# Patient Record
Sex: Female | Born: 1935 | Race: White | Hispanic: No | Marital: Married | Smoking: Never smoker
Health system: Southern US, Community
[De-identification: ages and names within clinical notes are randomized; demographics above are authoritative.]

## PROBLEM LIST (undated history)

## (undated) DIAGNOSIS — C50919 Malignant neoplasm of unspecified site of unspecified female breast: Secondary | ICD-10-CM

## (undated) DIAGNOSIS — C801 Malignant (primary) neoplasm, unspecified: Secondary | ICD-10-CM

## (undated) DIAGNOSIS — E039 Hypothyroidism, unspecified: Secondary | ICD-10-CM

## (undated) DIAGNOSIS — E079 Disorder of thyroid, unspecified: Secondary | ICD-10-CM

## (undated) DIAGNOSIS — I499 Cardiac arrhythmia, unspecified: Secondary | ICD-10-CM

## (undated) DIAGNOSIS — C541 Malignant neoplasm of endometrium: Secondary | ICD-10-CM

## (undated) DIAGNOSIS — E785 Hyperlipidemia, unspecified: Secondary | ICD-10-CM

## (undated) DIAGNOSIS — R609 Edema, unspecified: Secondary | ICD-10-CM

## (undated) DIAGNOSIS — I10 Essential (primary) hypertension: Secondary | ICD-10-CM

## (undated) DIAGNOSIS — N189 Chronic kidney disease, unspecified: Secondary | ICD-10-CM

## (undated) DIAGNOSIS — K589 Irritable bowel syndrome without diarrhea: Secondary | ICD-10-CM

## (undated) HISTORY — PX: CATARACT EXTRACTION: SUR2

## (undated) HISTORY — PX: CHOLECYSTECTOMY: SHX55

## (undated) HISTORY — DX: Disorder of thyroid, unspecified: E07.9

## (undated) HISTORY — DX: Cardiac arrhythmia, unspecified: I49.9

## (undated) HISTORY — DX: Hyperlipidemia, unspecified: E78.5

## (undated) HISTORY — PX: TOE AMPUTATION: SHX809

## (undated) HISTORY — DX: Malignant neoplasm of endometrium: C54.1

## (undated) HISTORY — DX: Edema, unspecified: R60.9

## (undated) HISTORY — DX: Essential (primary) hypertension: I10

## (undated) HISTORY — DX: Malignant (primary) neoplasm, unspecified: C80.1

## (undated) HISTORY — DX: Irritable bowel syndrome, unspecified: K58.9

## (undated) HISTORY — DX: Chronic kidney disease, unspecified: N18.9

---

## 2011-01-21 LAB — HM COLONOSCOPY: HM Colonoscopy: NORMAL

## 2011-12-28 LAB — HM MAMMOGRAPHY: HM Mammogram: NORMAL

## 2012-01-21 LAB — HM PAP SMEAR: HM Pap smear: NORMAL

## 2012-04-29 ENCOUNTER — Ambulatory Visit: Payer: Self-pay | Admitting: Family Medicine

## 2012-07-30 ENCOUNTER — Ambulatory Visit (INDEPENDENT_AMBULATORY_CARE_PROVIDER_SITE_OTHER)
Admission: RE | Admit: 2012-07-30 | Discharge: 2012-07-30 | Disposition: A | Discharge status: Home / Self Care | Payer: Medicare Other | Source: Ambulatory Visit | Attending: Internal Medicine | Admitting: Internal Medicine

## 2012-07-30 ENCOUNTER — Ambulatory Visit (INDEPENDENT_AMBULATORY_CARE_PROVIDER_SITE_OTHER): Payer: Medicare Other | Admitting: Internal Medicine

## 2012-07-30 ENCOUNTER — Encounter: Payer: Self-pay | Admitting: Internal Medicine

## 2012-07-30 ENCOUNTER — Ambulatory Visit: Payer: Medicare Other

## 2012-07-30 VITALS — BP 102/80 | HR 52 | Temp 97.7°F | Ht 64.25 in | Wt 185.0 lb

## 2012-07-30 DIAGNOSIS — E785 Hyperlipidemia, unspecified: Secondary | ICD-10-CM

## 2012-07-30 DIAGNOSIS — J40 Bronchitis, not specified as acute or chronic: Secondary | ICD-10-CM

## 2012-07-30 DIAGNOSIS — I10 Essential (primary) hypertension: Secondary | ICD-10-CM | POA: Insufficient documentation

## 2012-07-30 DIAGNOSIS — R350 Frequency of micturition: Secondary | ICD-10-CM

## 2012-07-30 DIAGNOSIS — E039 Hypothyroidism, unspecified: Secondary | ICD-10-CM | POA: Insufficient documentation

## 2012-07-30 LAB — COMPREHENSIVE METABOLIC PANEL
ALT: 18 U/L (ref 0–35)
AST: 25 U/L (ref 0–37)
Albumin: 3.7 g/dL (ref 3.5–5.2)
Alkaline Phosphatase: 78 U/L (ref 39–117)
BUN: 23 mg/dL (ref 6–23)
CO2: 25 mEq/L (ref 19–32)
Calcium: 9.1 mg/dL (ref 8.4–10.5)
Chloride: 100 mEq/L (ref 96–112)
Creatinine, Ser: 1.3 mg/dL — ABNORMAL HIGH (ref 0.4–1.2)
GFR: 40.89 mL/min — ABNORMAL LOW (ref >60.00)
Glucose, Bld: 110 mg/dL — ABNORMAL HIGH (ref 70–99)
Potassium: 4.8 mEq/L (ref 3.5–5.1)
Sodium: 133 mEq/L — ABNORMAL LOW (ref 135–145)
Total Bilirubin: 0.3 mg/dL (ref 0.3–1.2)
Total Protein: 7.2 g/dL (ref 6.0–8.3)

## 2012-07-30 LAB — POCT URINALYSIS DIPSTICK
Bilirubin, UA: NEGATIVE
Glucose, UA: NEGATIVE
Nitrite, UA: NEGATIVE
Protein, UA: 30
Spec Grav, UA: 1.025
Urobilinogen, UA: 0.2
pH, UA: 5.5

## 2012-07-30 LAB — LIPID PANEL
Cholesterol: 164 mg/dL (ref 0–200)
HDL: 30.4 mg/dL — ABNORMAL LOW (ref >39.00)
LDL Cholesterol: 101 mg/dL — ABNORMAL HIGH (ref 0–99)
Total CHOL/HDL Ratio: 5
Triglycerides: 165 mg/dL — ABNORMAL HIGH (ref 0.0–149.0)
VLDL: 33 mg/dL (ref 0.0–40.0)

## 2012-07-30 LAB — TSH: TSH: 1.16 u[IU]/mL (ref 0.35–5.50)

## 2012-07-30 MED ORDER — LEVOFLOXACIN 500 MG PO TABS: 500.0000 mg | ORAL_TABLET | Freq: Every day | ORAL | Status: DC

## 2012-07-30 MED ORDER — HYDROCOD POLST-CHLORPHEN POLST 10-8 MG/5ML PO LQCR: 5.0000 mL | Freq: Two times a day (BID) | ORAL | Status: DC | PRN

## 2012-07-30 MED ORDER — LEVOFLOXACIN 500 MG PO TABS: 250.0000 mg | ORAL_TABLET | Freq: Every day | ORAL | Status: DC

## 2012-07-30 NOTE — Assessment & Plan Note (Signed)
Lipids show mild elevation of triglycerides. LDL normal. Will request old records for comparison.

## 2012-07-30 NOTE — Assessment & Plan Note (Addendum)
Symptoms of increased urinary frequency with coughing, but no dysuria, fever, chills, flank pain. Urinalysis shows trace blood and leukocytes but no other pos markers of infection. Will send urine for culture.

## 2012-07-30 NOTE — Assessment & Plan Note (Signed)
TSH normal today. Continue Synthroid.

## 2012-07-30 NOTE — Progress Notes (Signed)
Subjective:    Patient ID: Whitney Simmons, female    DOB: 09-16-1927, 79 y.o.   MRN: [id number]  HPI 79 year old female with history of hypertension, hypothyroidism presents to establish care. She recently moved with her husband from [place]. Her primary concern today is a one-week history of nasal congestion, cough productive of purulent sputum, and occasional shortness of breath. She denies any chest pain. She denies any fever or chills. She has not taken any medication for this.  In regards to her chronic medical conditions, she reports full compliance with her medicines. She was last seen by her former primary care physician in approximately August of last year.  Outpatient Encounter Prescriptions as of 07/30/2012  Medication Sig Dispense Refill  . aspirin 81 MG tablet Take 81 mg by mouth daily.      Marland Kitchen levothyroxine (SYNTHROID, LEVOTHROID) 100 MCG tablet Take 100 mcg by mouth daily.      Marland Kitchen losartan (COZAAR) 50 MG tablet Take 50 mg by mouth daily.      . Multiple Vitamin (MULTIVITAMIN) tablet Take 1 tablet by mouth daily.      . nebivolol (BYSTOLIC) 5 MG tablet Take 5 mg by mouth daily.      . chlorpheniramine-HYDROcodone (TUSSIONEX) 10-8 MG/5ML LQCR Take 5 mLs by mouth every 12 (twelve) hours as needed.  140 mL  0  . levofloxacin (LEVAQUIN) 500 MG tablet Take 0.5 tablets (250 mg total) by mouth daily.  7 tablet  0   BP 102/80  Pulse 52  Temp 97.7 F (36.5 C)  Ht 5' 4.25" (1.632 m)  Wt 185 lb (83.915 kg)  BMI 31.51 kg/m2  SpO2 96%  Review of Systems  Constitutional: Positive for fatigue. Negative for fever, chills, appetite change and unexpected weight change.  HENT: Positive for congestion, rhinorrhea and postnasal drip. Negative for ear pain, sore throat, trouble swallowing, neck pain, voice change and sinus pressure.   Eyes: Negative for visual disturbance.  Respiratory: Positive for cough and shortness of breath. Negative for wheezing and stridor.   Cardiovascular:  Negative for chest pain, palpitations and leg swelling.  Gastrointestinal: Negative for nausea, vomiting, abdominal pain, diarrhea, constipation, blood in stool, abdominal distention and anal bleeding.  Genitourinary: Negative for dysuria and flank pain.  Musculoskeletal: Negative for myalgias, arthralgias and gait problem.  Skin: Negative for color change and rash.  Neurological: Negative for dizziness and headaches.  Hematological: Negative for adenopathy. Does not bruise/bleed easily.  Psychiatric/Behavioral: Negative for suicidal ideas, sleep disturbance and dysphoric mood. The patient is not nervous/anxious.        Objective:   Physical Exam  Constitutional: She is oriented to person, place, and time. She appears well-developed and well-nourished. No distress.  HENT:  Head: Normocephalic and atraumatic.  Right Ear: External ear normal.  Left Ear: External ear normal.  Nose: Nose normal.  Mouth/Throat: Oropharynx is clear and moist. No oropharyngeal exudate.  Eyes: Conjunctivae normal are normal. Pupils are equal, round, and reactive to light. Right eye exhibits no discharge. Left eye exhibits no discharge. No scleral icterus.  Neck: Normal range of motion. Neck supple. No tracheal deviation present. No thyromegaly present.  Cardiovascular: Normal rate, regular rhythm, normal heart sounds and intact distal pulses.  Exam reveals no gallop and no friction rub.   No murmur heard. Pulmonary/Chest: Effort normal. No accessory muscle usage. Not tachypneic. No respiratory distress. She has no decreased breath sounds. She has no wheezes. She has rhonchi (scattered). She has no rales. She exhibits  no tenderness.  Musculoskeletal: Normal range of motion. She exhibits no edema and no tenderness.  Lymphadenopathy:    She has no cervical adenopathy.  Neurological: She is alert and oriented to person, place, and time. No cranial nerve deficit. She exhibits normal muscle tone. Coordination normal.   Skin: Skin is warm and dry. No rash noted. She is not diaphoretic. No erythema. No pallor.  Psychiatric: She has a normal mood and affect. Her behavior is normal. Judgment and thought content normal.          Assessment & Plan:

## 2012-07-30 NOTE — Assessment & Plan Note (Signed)
Symptoms and exam consistent with bronchitis. CXR suggests possible early pneumonia in the lingula. Will treat with levaquin. Will use Tussionex prn cough. (Note that initial dose called in was 500mg  daily, however renal function came back showing GFR 40, so called patient and will treat with 250mg  daily x 7 days instead. Follow up on Monday.

## 2012-07-30 NOTE — Assessment & Plan Note (Signed)
BP Readings from Last 3 Encounters:  07/30/12 102/80   BP well controlled with current medications. Renal function decreased on labs today. No previous labs for comparison. Will request old records.

## 2012-07-31 LAB — CBC WITH DIFFERENTIAL/PLATELET
Basophils Absolute: 0.1 10*3/uL (ref 0.0–0.1)
Basophils Relative: 1 % (ref 0–1)
Eosinophils Absolute: 0.1 10*3/uL (ref 0.0–0.7)
Eosinophils Relative: 1 % (ref 0–5)
HCT: 39.4 % (ref 36.0–46.0)
Hemoglobin: 13.5 g/dL (ref 12.0–15.0)
Lymphocytes Relative: 16 % (ref 12–46)
Lymphs Abs: 1.2 10*3/uL (ref 0.7–4.0)
MCH: 29.6 pg (ref 26.0–34.0)
MCHC: 34.3 g/dL (ref 30.0–36.0)
MCV: 86.4 fL (ref 78.0–100.0)
Monocytes Absolute: 1 10*3/uL (ref 0.1–1.0)
Monocytes Relative: 13 % — ABNORMAL HIGH (ref 3–12)
Neutro Abs: 5.4 10*3/uL (ref 1.7–7.7)
Neutrophils Relative %: 69 % (ref 43–77)
RBC: 4.56 MIL/uL (ref 3.87–5.11)
RDW: 14.2 % (ref 11.5–15.5)
WBC: 7.7 10*3/uL (ref 4.0–10.5)

## 2012-08-01 LAB — URINE CULTURE: Colony Count: >=100000

## 2012-08-02 ENCOUNTER — Encounter: Payer: Self-pay | Admitting: Internal Medicine

## 2012-08-02 ENCOUNTER — Ambulatory Visit (INDEPENDENT_AMBULATORY_CARE_PROVIDER_SITE_OTHER): Payer: Medicare Other | Admitting: Internal Medicine

## 2012-08-02 VITALS — BP 124/80 | HR 75 | Temp 97.7°F | Ht 64.25 in | Wt 184.0 lb

## 2012-08-02 DIAGNOSIS — J40 Bronchitis, not specified as acute or chronic: Secondary | ICD-10-CM

## 2012-08-02 DIAGNOSIS — F411 Generalized anxiety disorder: Secondary | ICD-10-CM

## 2012-08-02 DIAGNOSIS — F419 Anxiety disorder, unspecified: Secondary | ICD-10-CM | POA: Insufficient documentation

## 2012-08-02 NOTE — Patient Instructions (Signed)
Start Miralax 17gm (1 capful) mixed in a beverage by mouth daily as needed for constipation.

## 2012-08-02 NOTE — Assessment & Plan Note (Signed)
Symptoms of anxiety worsened with certain medications. Offered support today. Discussed adding medication to help with anxiety symptoms, but pt would like to hold off for now. Will recheck in 2 days.

## 2012-08-02 NOTE — Progress Notes (Signed)
Subjective:    Patient ID: Whitney Simmons, female    DOB: 03-17-1928, 79 y.o.   MRN: [id number]  HPI 79YO female with recent h/o bronchitis presents for follow up. Tearful today. Having severe anxiety with use of Levaquin. Reports similar episodes in the past with other antibiotics. Describes paranoia and extreme anxiety, no hallucinations. Denies any recurrent fever, chills, chest pain. Cough has been gradually improving. Using Tussionex with improvement in cough symptoms.  Outpatient Encounter Prescriptions as of 08/02/2012  Medication Sig Dispense Refill  . aspirin 81 MG tablet Take 81 mg by mouth daily.      . chlorpheniramine-HYDROcodone (TUSSIONEX) 10-8 MG/5ML LQCR Take 5 mLs by mouth every 12 (twelve) hours as needed.  140 mL  0  . levothyroxine (SYNTHROID, LEVOTHROID) 100 MCG tablet Take 100 mcg by mouth daily.      Marland Kitchen losartan (COZAAR) 50 MG tablet Take 50 mg by mouth daily.      . Multiple Vitamin (MULTIVITAMIN) tablet Take 1 tablet by mouth daily.      . nebivolol (BYSTOLIC) 5 MG tablet Take 5 mg by mouth daily.      . [DISCONTINUED] levofloxacin (LEVAQUIN) 500 MG tablet Take 0.5 tablets (250 mg total) by mouth daily.  7 tablet  0   BP 124/80  Pulse 75  Temp 97.7 F (36.5 C) (Oral)  Ht 5' 4.25" (1.632 m)  Wt 184 lb (83.462 kg)  BMI 31.34 kg/m2  SpO2 97%  Review of Systems  Constitutional: Negative for fever, chills, appetite change, fatigue and unexpected weight change.  HENT: Negative for ear pain, congestion, sore throat, trouble swallowing, neck pain, voice change and sinus pressure.   Eyes: Negative for visual disturbance.  Respiratory: Positive for cough. Negative for shortness of breath, wheezing and stridor.   Cardiovascular: Negative for chest pain, palpitations and leg swelling.  Gastrointestinal: Negative for nausea, vomiting, abdominal pain, diarrhea, constipation, blood in stool, abdominal distention and anal bleeding.  Genitourinary: Negative for dysuria and flank  pain.  Musculoskeletal: Negative for myalgias, arthralgias and gait problem.  Skin: Negative for color change and rash.  Neurological: Negative for dizziness and headaches.  Hematological: Negative for adenopathy. Does not bruise/bleed easily.  Psychiatric/Behavioral: Negative for suicidal ideas, sleep disturbance and dysphoric mood. The patient is nervous/anxious.        Objective:   Physical Exam  Constitutional: She is oriented to person, place, and time. She appears well-developed and well-nourished. No distress.  HENT:  Head: Normocephalic and atraumatic.  Right Ear: External ear normal.  Left Ear: External ear normal.  Nose: Nose normal.  Mouth/Throat: Oropharynx is clear and moist. No oropharyngeal exudate.  Eyes: Conjunctivae normal are normal. Pupils are equal, round, and reactive to light. Right eye exhibits no discharge. Left eye exhibits no discharge. No scleral icterus.  Neck: Normal range of motion. Neck supple. No tracheal deviation present. No thyromegaly present.  Cardiovascular: Normal rate, regular rhythm, normal heart sounds and intact distal pulses.  Exam reveals no gallop and no friction rub.   No murmur heard. Pulmonary/Chest: Effort normal. No accessory muscle usage. Not tachypneic. No respiratory distress. She has no decreased breath sounds. She has no wheezes. She has rhonchi (diffuse). She has no rales. She exhibits no tenderness.  Musculoskeletal: Normal range of motion. She exhibits no edema and no tenderness.  Lymphadenopathy:    She has no cervical adenopathy.  Neurological: She is alert and oriented to person, place, and time. No cranial nerve deficit. She exhibits normal muscle tone.  Coordination normal.  Skin: Skin is warm and dry. No rash noted. She is not diaphoretic. No erythema. No pallor.  Psychiatric: Her speech is normal. Judgment and thought content normal. Her mood appears anxious. She is agitated. Cognition and memory are normal.           Assessment & Plan:

## 2012-08-02 NOTE — Assessment & Plan Note (Signed)
Completed 3 days of antibiotics, however having severe anxiety with antibiotic medication. Reports similar experience with other antibiotics. Given that she is afebrile, CXR normal, will stop antibiotics. Will monitor symptoms closely. If any recurrent fever or worsening cough, will try alternative antibiotic. Will try to obtain records on previous evaluation and review records on what antibiotics were better tolerated in the past. Follow up 2 days.

## 2012-08-04 ENCOUNTER — Ambulatory Visit (INDEPENDENT_AMBULATORY_CARE_PROVIDER_SITE_OTHER): Payer: Medicare Other | Admitting: Internal Medicine

## 2012-08-04 ENCOUNTER — Encounter: Payer: Self-pay | Admitting: Internal Medicine

## 2012-08-04 VITALS — BP 144/70 | HR 70 | Temp 97.8°F | Ht 64.25 in | Wt 183.2 lb

## 2012-08-04 DIAGNOSIS — F411 Generalized anxiety disorder: Secondary | ICD-10-CM

## 2012-08-04 DIAGNOSIS — F419 Anxiety disorder, unspecified: Secondary | ICD-10-CM

## 2012-08-04 DIAGNOSIS — J40 Bronchitis, not specified as acute or chronic: Secondary | ICD-10-CM

## 2012-08-04 MED ORDER — CLORAZEPATE DIPOTASSIUM 3.75 MG PO TABS: 3.7500 mg | ORAL_TABLET | Freq: Two times a day (BID) | ORAL | Status: DC | PRN

## 2012-08-04 NOTE — Assessment & Plan Note (Signed)
Symptoms gradually improving. Will continue to monitor.

## 2012-08-04 NOTE — Assessment & Plan Note (Signed)
Significant anxiety related to recent move to [place], providing care for husband and daughter's recent diagnosis of leukemia. Will start clorazepate twice daily as needed. Follow up in 1-2 week and prn.

## 2012-08-04 NOTE — Progress Notes (Signed)
Subjective:    Patient ID: Whitney Simmons, female    DOB: Nov 23, 1927, 79 y.o.   MRN: [id number]  HPI 79 year old female with recent history of bronchitis presents for followup. She was treated with Levaquin for 3 days but had worsening anxiety on this medication. It was stopped 2 days ago. She reports that symptoms of cough are gradually improving. No fever, chills, chest pain, dyspnea. However, she continues to have significant anxiety. She attributes this to recent move to [facility], caring for her husband and her daughters recent diagnosis of leukemia. She is tearful today describing this. She has taken medication for anxiety in the distant past and thinks it was clorazepate. She did well with this medication. She would like to restart it.  Outpatient Encounter Prescriptions as of 08/04/2012  Medication Sig Dispense Refill  . aspirin 81 MG tablet Take 81 mg by mouth daily.      . chlorpheniramine-HYDROcodone (TUSSIONEX) 10-8 MG/5ML LQCR Take 5 mLs by mouth every 12 (twelve) hours as needed.  140 mL  0  . levothyroxine (SYNTHROID, LEVOTHROID) 100 MCG tablet Take 100 mcg by mouth daily.      Marland Kitchen losartan (COZAAR) 50 MG tablet Take 50 mg by mouth daily.      . Multiple Vitamin (MULTIVITAMIN) tablet Take 1 tablet by mouth daily.      . nebivolol (BYSTOLIC) 5 MG tablet Take 5 mg by mouth daily.      . clorazepate (TRANXENE) 3.75 MG tablet Take 1 tablet (3.75 mg total) by mouth 2 (two) times daily as needed for anxiety.  60 tablet  3   BP 144/70  Pulse 70  Temp 97.8 F (36.6 C) (Oral)  Ht 5' 4.25" (1.632 m)  Wt 183 lb 4 oz (83.122 kg)  BMI 31.21 kg/m2  SpO2 98%  Review of Systems  Constitutional: Negative for fever, chills, appetite change, fatigue and unexpected weight change.  HENT: Negative for ear pain, congestion, sore throat, trouble swallowing, neck pain, voice change and sinus pressure.   Eyes: Negative for visual disturbance.  Respiratory: Positive for cough. Negative  for shortness of breath, wheezing and stridor.   Cardiovascular: Negative for chest pain, palpitations and leg swelling.  Gastrointestinal: Negative for nausea, vomiting, abdominal pain, diarrhea, constipation, blood in stool, abdominal distention and anal bleeding.  Genitourinary: Negative for dysuria and flank pain.  Musculoskeletal: Negative for myalgias, arthralgias and gait problem.  Skin: Negative for color change and rash.  Neurological: Negative for dizziness and headaches.  Hematological: Negative for adenopathy. Does not bruise/bleed easily.  Psychiatric/Behavioral: Positive for sleep disturbance. Negative for suicidal ideas and dysphoric mood. The patient is nervous/anxious.        Objective:   Physical Exam  Constitutional: She is oriented to person, place, and time. She appears well-developed and well-nourished. No distress.  HENT:  Head: Normocephalic and atraumatic.  Right Ear: External ear normal.  Left Ear: External ear normal.  Nose: Nose normal.  Mouth/Throat: Oropharynx is clear and moist.  Eyes: Conjunctivae normal are normal. Pupils are equal, round, and reactive to light. Right eye exhibits no discharge. Left eye exhibits no discharge. No scleral icterus.  Neck: Normal range of motion. Neck supple. No tracheal deviation present. No thyromegaly present.  Cardiovascular: Normal rate, regular rhythm, normal heart sounds and intact distal pulses.  Exam reveals no gallop and no friction rub.   No murmur heard. Pulmonary/Chest: Effort normal. No accessory muscle usage. Not tachypneic. No respiratory distress. She has no wheezes. She has  rhonchi (scattered). She has no rales. She exhibits no tenderness.  Musculoskeletal: Normal range of motion. She exhibits no edema and no tenderness.  Lymphadenopathy:    She has no cervical adenopathy.  Neurological: She is alert and oriented to person, place, and time. No cranial nerve deficit. She exhibits normal muscle tone.  Coordination normal.  Skin: Skin is warm and dry. No rash noted. She is not diaphoretic. No erythema. No pallor.  Psychiatric: She has a normal mood and affect. Her behavior is normal. Judgment and thought content normal.          Assessment & Plan:

## 2012-08-18 ENCOUNTER — Encounter: Payer: Self-pay | Admitting: Internal Medicine

## 2012-08-18 ENCOUNTER — Ambulatory Visit: Payer: Medicare Other | Admitting: Internal Medicine

## 2012-08-18 ENCOUNTER — Ambulatory Visit (INDEPENDENT_AMBULATORY_CARE_PROVIDER_SITE_OTHER): Payer: Medicare Other | Admitting: Internal Medicine

## 2012-08-18 VITALS — BP 113/67 | HR 70 | Temp 97.5°F | Wt 184.0 lb

## 2012-08-18 DIAGNOSIS — F419 Anxiety disorder, unspecified: Secondary | ICD-10-CM

## 2012-08-18 DIAGNOSIS — R7989 Other specified abnormal findings of blood chemistry: Secondary | ICD-10-CM

## 2012-08-18 DIAGNOSIS — R799 Abnormal finding of blood chemistry, unspecified: Secondary | ICD-10-CM

## 2012-08-18 DIAGNOSIS — N183 Chronic kidney disease, stage 3 unspecified: Secondary | ICD-10-CM | POA: Insufficient documentation

## 2012-08-18 DIAGNOSIS — F411 Generalized anxiety disorder: Secondary | ICD-10-CM

## 2012-08-18 LAB — COMPREHENSIVE METABOLIC PANEL
ALT: 17 U/L (ref 0–35)
AST: 19 U/L (ref 0–37)
Albumin: 3.7 g/dL (ref 3.5–5.2)
Alkaline Phosphatase: 63 U/L (ref 39–117)
BUN: 17 mg/dL (ref 6–23)
CO2: 24 mEq/L (ref 19–32)
Calcium: 8.9 mg/dL (ref 8.4–10.5)
Chloride: 105 mEq/L (ref 96–112)
Creatinine, Ser: 1.2 mg/dL (ref 0.4–1.2)
GFR: 45.63 mL/min — ABNORMAL LOW (ref >60.00)
Glucose, Bld: 129 mg/dL — ABNORMAL HIGH (ref 70–99)
Potassium: 4.9 mEq/L (ref 3.5–5.1)
Sodium: 138 mEq/L (ref 135–145)
Total Bilirubin: 0.4 mg/dL (ref 0.3–1.2)
Total Protein: 6.8 g/dL (ref 6.0–8.3)

## 2012-08-18 MED ORDER — SERTRALINE HCL 25 MG PO TABS: 25.0000 mg | ORAL_TABLET | Freq: Every day | ORAL | Status: DC

## 2012-08-18 NOTE — Assessment & Plan Note (Signed)
Symptoms poorly controlled on Tranxene. Will change to Sertraline, starting at 25mg  daily. Plan to follow up in 2-3 weeks. She will call sooner if any problems or concerns.

## 2012-08-18 NOTE — Assessment & Plan Note (Signed)
Cr of 1.3 noted on recent labs, stable today 1.2.  Will try to obtain previous records for comparison.

## 2012-08-18 NOTE — Progress Notes (Signed)
Subjective:    Patient ID: Whitney Simmons, female    DOB: 1928/04/20, 79 y.o.   MRN: [id number]  HPI 79YO female with h/o hypothyroidism, anxiety, hypertension presents for follow up.   Anxiety - symptoms poorly controlled on Tranxene. Having difficulty sleeping. Anxious and depressed mood. Ongoing stressors related to husband's health issues, recent move, and daughter's diagnosis of leukemia. No currently undergoing counseling.  Outpatient Encounter Prescriptions as of 08/18/2012  Medication Sig Dispense Refill  . aspirin 81 MG tablet Take 81 mg by mouth daily.      Marland Kitchen levothyroxine (SYNTHROID, LEVOTHROID) 100 MCG tablet Take 100 mcg by mouth daily.      Marland Kitchen losartan (COZAAR) 50 MG tablet Take 50 mg by mouth daily.      . Multiple Vitamin (MULTIVITAMIN) tablet Take 1 tablet by mouth daily.      . nebivolol (BYSTOLIC) 5 MG tablet Take 5 mg by mouth daily.      . sertraline (ZOLOFT) 25 MG tablet Take 1 tablet (25 mg total) by mouth daily.  30 tablet  3   No facility-administered encounter medications on file as of 08/18/2012.   BP 113/67  Pulse 70  Temp(Src) 97.5 F (36.4 C) (Oral)  Wt 184 lb (83.462 kg)  BMI 31.34 kg/m2  SpO2 97%  Review of Systems  Constitutional: Negative for fever, chills, appetite change, fatigue and unexpected weight change.  HENT: Negative for ear pain, congestion, sore throat, trouble swallowing, neck pain, voice change and sinus pressure.   Eyes: Negative for visual disturbance.  Respiratory: Negative for cough, shortness of breath, wheezing and stridor.   Cardiovascular: Negative for chest pain, palpitations and leg swelling.  Gastrointestinal: Negative for nausea, vomiting, abdominal pain, diarrhea, constipation, blood in stool, abdominal distention and anal bleeding.  Genitourinary: Negative for dysuria and flank pain.  Musculoskeletal: Negative for myalgias, arthralgias and gait problem.  Skin: Negative for color change and rash.  Neurological: Negative for  dizziness and headaches.  Hematological: Negative for adenopathy. Does not bruise/bleed easily.  Psychiatric/Behavioral: Positive for sleep disturbance, dysphoric mood and decreased concentration. Negative for suicidal ideas. The patient is nervous/anxious.        Objective:   Physical Exam  Constitutional: She is oriented to person, place, and time. She appears well-developed and well-nourished. No distress.  HENT:  Head: Normocephalic and atraumatic.  Right Ear: External ear normal.  Left Ear: External ear normal.  Nose: Nose normal.  Mouth/Throat: Oropharynx is clear and moist. No oropharyngeal exudate.  Eyes: Conjunctivae are normal. Pupils are equal, round, and reactive to light. Right eye exhibits no discharge. Left eye exhibits no discharge. No scleral icterus.  Neck: Normal range of motion. Neck supple. No tracheal deviation present. No thyromegaly present.  Cardiovascular: Normal rate, regular rhythm, normal heart sounds and intact distal pulses.  Exam reveals no gallop and no friction rub.   No murmur heard. Pulmonary/Chest: Effort normal and breath sounds normal. No respiratory distress. She has no wheezes. She has no rales. She exhibits no tenderness.  Musculoskeletal: Normal range of motion. She exhibits no edema and no tenderness.  Lymphadenopathy:    She has no cervical adenopathy.  Neurological: She is alert and oriented to person, place, and time. No cranial nerve deficit. She exhibits normal muscle tone. Coordination normal.  Skin: Skin is warm and dry. No rash noted. She is not diaphoretic. No erythema. No pallor.  Psychiatric: Her speech is normal and behavior is normal. Judgment and thought content normal. Her mood appears anxious.  Cognition and memory are normal. She exhibits a depressed mood.          Assessment & Plan:

## 2012-09-01 ENCOUNTER — Encounter: Payer: Self-pay | Admitting: Internal Medicine

## 2012-09-01 ENCOUNTER — Ambulatory Visit (INDEPENDENT_AMBULATORY_CARE_PROVIDER_SITE_OTHER): Payer: Medicare Other | Admitting: Internal Medicine

## 2012-09-01 VITALS — BP 143/69 | HR 61 | Temp 97.7°F | Wt 184.0 lb

## 2012-09-01 DIAGNOSIS — F411 Generalized anxiety disorder: Secondary | ICD-10-CM

## 2012-09-01 DIAGNOSIS — F419 Anxiety disorder, unspecified: Secondary | ICD-10-CM

## 2012-09-01 DIAGNOSIS — R002 Palpitations: Secondary | ICD-10-CM | POA: Insufficient documentation

## 2012-09-01 NOTE — Assessment & Plan Note (Signed)
Unable to tolerate sertraline for anxiety because of nausea. Given ongoing issues with palpitations we'll hold off on additional medications at this point. Followup 4 weeks.

## 2012-09-01 NOTE — Progress Notes (Signed)
Subjective:    Patient ID: Whitney Simmons, female    DOB: 1928-05-21, 79 y.o.   MRN: [id number]  HPI 79 year old female initially presented with her husband at his visit today, but was worked in as an acute visit given several week history of intermittent episodes of palpitations. She reports that episodes of palpitations occur both at rest and on exertion. They're described as rapid forceful heart beats with associated diaphoresis and some shortness of breath. She denies chest pain. She denies use of any new medications. She has been compliant with Bystolic. She has had recent increased anxiety related to move and her daughter's recent diagnosis of leukemia.  However, she notes that symptoms occur even without anxious mood. She was tried on Sertraline for anxiety, but had persistent nausea, so stopped this medication.  Outpatient Encounter Prescriptions as of 09/01/2012  Medication Sig Dispense Refill  . aspirin 81 MG tablet Take 81 mg by mouth daily.      Marland Kitchen levothyroxine (SYNTHROID, LEVOTHROID) 100 MCG tablet Take 100 mcg by mouth daily.      Marland Kitchen losartan (COZAAR) 50 MG tablet Take 50 mg by mouth daily.      . Multiple Vitamin (MULTIVITAMIN) tablet Take 1 tablet by mouth daily.      . nebivolol (BYSTOLIC) 5 MG tablet Take 5 mg by mouth daily.      . chlorpheniramine-HYDROcodone (TUSSIONEX) 10-8 MG/5ML LQCR Take 5 mLs by mouth every 12 (twelve) hours as needed.  140 mL  0  . [DISCONTINUED] sertraline (ZOLOFT) 25 MG tablet Take 1 tablet (25 mg total) by mouth daily.  30 tablet  3   No facility-administered encounter medications on file as of 09/01/2012.   BP 143/69  Pulse 61  Temp(Src) 97.7 F (36.5 C) (Oral)  Wt 184 lb (83.462 kg)  BMI 31.34 kg/m2  SpO2 98%  Review of Systems  Constitutional: Positive for diaphoresis. Negative for fever, chills, appetite change, fatigue and unexpected weight change.  HENT: Negative for ear pain, congestion, sore throat, trouble swallowing, neck pain, voice  change and sinus pressure.   Eyes: Negative for visual disturbance.  Respiratory: Positive for shortness of breath. Negative for cough, wheezing and stridor.   Cardiovascular: Positive for palpitations. Negative for chest pain and leg swelling.  Gastrointestinal: Negative for nausea, vomiting, abdominal pain, diarrhea, constipation, blood in stool, abdominal distention and anal bleeding.  Genitourinary: Negative for dysuria and flank pain.  Musculoskeletal: Negative for myalgias, arthralgias and gait problem.  Skin: Negative for color change and rash.  Neurological: Positive for light-headedness. Negative for dizziness and headaches.  Hematological: Negative for adenopathy. Does not bruise/bleed easily.  Psychiatric/Behavioral: Negative for suicidal ideas, sleep disturbance and dysphoric mood. The patient is nervous/anxious.        Objective:   Physical Exam  Constitutional: She is oriented to person, place, and time. She appears well-developed and well-nourished. No distress.  HENT:  Head: Normocephalic and atraumatic.  Right Ear: External ear normal.  Left Ear: External ear normal.  Nose: Nose normal.  Mouth/Throat: Oropharynx is clear and moist. No oropharyngeal exudate.  Eyes: Conjunctivae are normal. Pupils are equal, round, and reactive to light. Right eye exhibits no discharge. Left eye exhibits no discharge. No scleral icterus.  Neck: Normal range of motion. Neck supple. No tracheal deviation present. No thyromegaly present.  Cardiovascular: Normal rate, regular rhythm, normal heart sounds and intact distal pulses.  Frequent extrasystoles are present. Exam reveals no gallop and no friction rub.   No murmur heard. Pulmonary/Chest:  Effort normal and breath sounds normal. No accessory muscle usage. Not tachypneic. No respiratory distress. She has no decreased breath sounds. She has no wheezes. She has no rhonchi. She has no rales. She exhibits no tenderness.  Musculoskeletal: Normal  range of motion. She exhibits no edema and no tenderness.  Lymphadenopathy:    She has no cervical adenopathy.  Neurological: She is alert and oriented to person, place, and time. No cranial nerve deficit. She exhibits normal muscle tone. Coordination normal.  Skin: Skin is warm and dry. No rash noted. She is not diaphoretic. No erythema. No pallor.  Psychiatric: Her behavior is normal. Judgment and thought content normal. Her mood appears anxious.          Assessment & Plan:

## 2012-09-01 NOTE — Assessment & Plan Note (Signed)
Recent episodes of palpitations associated with some dyspnea concerning for cardiac arrhythmia. EKG today showed PVCs. Will set up cardiology evaluation. Question if the Holter monitor might be helpful. Also question if stress test may be helpful. Will continue Bystolic. Followup 4 weeks or sooner as needed.

## 2012-09-02 ENCOUNTER — Ambulatory Visit: Payer: Medicare Other | Admitting: Internal Medicine

## 2012-09-13 ENCOUNTER — Ambulatory Visit: Payer: Medicare Other | Admitting: Cardiovascular Disease

## 2012-09-24 ENCOUNTER — Encounter: Payer: Self-pay | Admitting: Adult Health

## 2012-09-24 ENCOUNTER — Ambulatory Visit (INDEPENDENT_AMBULATORY_CARE_PROVIDER_SITE_OTHER): Payer: Medicare Other | Admitting: Adult Health

## 2012-09-24 VITALS — BP 118/68 | HR 81 | Temp 97.8°F | Resp 16 | Wt 185.0 lb

## 2012-09-24 DIAGNOSIS — R3915 Urgency of urination: Secondary | ICD-10-CM

## 2012-09-24 DIAGNOSIS — R350 Frequency of micturition: Secondary | ICD-10-CM

## 2012-09-24 LAB — POCT URINALYSIS DIPSTICK
Bilirubin, UA: NEGATIVE
Glucose, UA: NEGATIVE
Ketones, UA: NEGATIVE
Nitrite, UA: NEGATIVE
Protein, UA: NEGATIVE
Spec Grav, UA: 1.02
Urobilinogen, UA: 0.2
pH, UA: 6

## 2012-09-24 MED ORDER — CIPROFLOXACIN HCL 250 MG PO TABS: 250.0000 mg | ORAL_TABLET | Freq: Two times a day (BID) | ORAL | Status: DC

## 2012-09-24 NOTE — Progress Notes (Signed)
  Subjective:    Patient ID: Whitney Simmons, female    DOB: August 20, 1927, 79 y.o.   MRN: [id number]  HPI  Patient presents to clinic as a walk in with s/s of UTI   Review of Systems  Constitutional: Negative for fever and chills.  Genitourinary: Positive for dysuria, urgency and frequency. Negative for hematuria.       Objective:   Physical Exam  Constitutional: She is oriented to person, place, and time. She appears well-developed and well-nourished. No distress.  Cardiovascular: Normal rate and regular rhythm.   Pulmonary/Chest: Effort normal.  Neurological: She is alert and oriented to person, place, and time.  Psychiatric: She has a normal mood and affect. Her behavior is normal. Judgment and thought content normal.          Assessment & Plan:

## 2012-09-24 NOTE — Patient Instructions (Addendum)
  Please start the Cipro today. You will take 1 tablet twice a day for 3 days.  If needed, you can also take a medicine to numb your bladder such as AZO or Urostat which are sold over the counter.    Below is information that may be helpful regarding a urinary tract infection:  What is the urinary tract? - The urinary tract is the group of organs in the body that handle urine. The urinary tract includes the:   Kidneys, two bean-shaped organs that filter the blood to make urine  Bladder, a balloon-shaped organ that stores urine  Ureters, two tubes that carry urine from the kidneys to the bladder  Urethra, the tube that carries urine from the bladder to the outside of the body   What are urinary tract infections? - Urinary tract infections, also called "UTIs," are infections that affect either the bladder or the kidneys. Bladder infections are more common than kidney infections. Bladder infections happen when bacteria get into the urethra and travel up into the bladder. Kidney infections happen when the bacteria travel even higher, up into the kidneys. Both bladder and kidney infections are more common in women than men.   What are the symptoms of a bladder infection? - The symptoms include:   Pain or a burning feeling when you urinate  The need to urinate often  The need to urinate suddenly or in a hurry  Blood in the urine   What are the symptoms of a kidney infection? - The symptoms of a kidney infection can include the symptoms of a bladder infection, but kidney infections can also cause:   Fever  Back pain  Nausea or vomiting   How are urinary tract infections treated? - Most urinary tract infections are treated with antibiotic pills. These pills work by killing the germs that cause the infection.  If you have a bladder infection, you will probably need to take antibiotics for 3 to 7 days. If you have a kidney infection, you will probably need to take antibiotics for longer-maybe  for up to 2 weeks. If you have a kidney infection, it's also possible you will need to be treated in the hospital.   Your symptoms should begin to improve within a day of starting antibiotics. But you should finish all the antibiotic pills you get. Otherwise your infection might come back.   Can cranberry juice or other cranberry products prevent bladder infections? - The studies suggesting that cranberry products prevent bladder infections are not very good. But if you want to try cranberry products for this purpose, there is probably not much harm in doing so.

## 2012-09-24 NOTE — Assessment & Plan Note (Signed)
Patient has been having urinary symptoms since Tuesday. She presents to clinic today as a walk-in for fear that her symptoms were going to worsen over the weekend. Urine dipstick positive for nitrites. Urine sent for culture start Cipro x3 days.

## 2012-09-26 LAB — URINE CULTURE
Colony Count: NO GROWTH
Organism ID, Bacteria: NO GROWTH

## 2012-09-30 ENCOUNTER — Encounter: Payer: Self-pay | Admitting: Internal Medicine

## 2012-09-30 ENCOUNTER — Ambulatory Visit (INDEPENDENT_AMBULATORY_CARE_PROVIDER_SITE_OTHER): Payer: Medicare Other | Admitting: Internal Medicine

## 2012-09-30 VITALS — BP 124/66 | HR 68 | Temp 98.1°F | Resp 18 | Wt 186.5 lb

## 2012-09-30 DIAGNOSIS — N183 Chronic kidney disease, stage 3 unspecified: Secondary | ICD-10-CM

## 2012-09-30 DIAGNOSIS — R319 Hematuria, unspecified: Secondary | ICD-10-CM

## 2012-09-30 DIAGNOSIS — R002 Palpitations: Secondary | ICD-10-CM

## 2012-09-30 DIAGNOSIS — I10 Essential (primary) hypertension: Secondary | ICD-10-CM

## 2012-09-30 NOTE — Assessment & Plan Note (Signed)
Recent hematuria in setting of UTI symptoms. Urine culture was negative. Recommended repeat UA today, however pt declined because of potential cost.

## 2012-09-30 NOTE — Assessment & Plan Note (Signed)
Recent GFR stable. Will plan to continue current medications. Repeat renal function 11/2012.

## 2012-09-30 NOTE — Assessment & Plan Note (Signed)
Palpitations improved. Pt canceled cardiology evaluation because of concerns about cost. Will continue current medications. She will call if recurrent symptoms of palpitations or other concerns.

## 2012-09-30 NOTE — Assessment & Plan Note (Signed)
BP Readings from Last 3 Encounters:  09/30/12 124/66  09/24/12 118/68  09/01/12 143/69   BP well controlled on current medications. Will continue.

## 2012-09-30 NOTE — Progress Notes (Signed)
Subjective:    Patient ID: Whitney Simmons, female    DOB: 1927/07/20, 79 y.o.   MRN: [id number]  HPI 79YO female with history of hypothyroidism, hypertension presents for followup. At her last visit, she complained of palpitations. She was scheduled to see cardiology for further evaluation but canceled this appointment. She reports she has been concerned about cost of medical treatment. She reports that palpitations have improved. She is compliant with her medications including Nebivolol and Losartan. She denies any recent chest pain, shortness of breath. She does have occasional left-sided headaches which have been chronic and ongoing for many years. These are unchanged. She does not take medication for headaches.  She was also recently seen in clinic and treated for urinary tract infection. She reports that symptoms of dysuria and frequency have resolved. She denies any fever, chills, flank pain.  Outpatient Encounter Prescriptions as of 09/30/2012  Medication Sig Dispense Refill  . aspirin 81 MG tablet Take 81 mg by mouth daily.      . chlorpheniramine-HYDROcodone (TUSSIONEX) 10-8 MG/5ML LQCR Take 5 mLs by mouth every 12 (twelve) hours as needed.  140 mL  0  . levothyroxine (SYNTHROID, LEVOTHROID) 100 MCG tablet Take 100 mcg by mouth daily.      Marland Kitchen losartan (COZAAR) 50 MG tablet Take 50 mg by mouth daily.      . Multiple Vitamin (MULTIVITAMIN) tablet Take 1 tablet by mouth daily.      . nebivolol (BYSTOLIC) 5 MG tablet Take 5 mg by mouth daily.       No facility-administered encounter medications on file as of 09/30/2012.   BP 124/66  Pulse 68  Temp(Src) 98.1 F (36.7 C) (Oral)  Resp 18  Wt 186 lb 8 oz (84.596 kg)  BMI 31.76 kg/m2  SpO2 96%  Review of Systems  Constitutional: Negative for fever, chills, appetite change, fatigue and unexpected weight change.  HENT: Negative for ear pain, congestion, sore throat, trouble swallowing, neck pain, voice change and sinus pressure.   Eyes:  Negative for visual disturbance.  Respiratory: Negative for cough, shortness of breath, wheezing and stridor.   Cardiovascular: Negative for chest pain, palpitations and leg swelling.  Gastrointestinal: Negative for nausea, vomiting, abdominal pain, diarrhea, constipation, blood in stool, abdominal distention and anal bleeding.  Genitourinary: Negative for dysuria and flank pain.  Musculoskeletal: Negative for myalgias, arthralgias and gait problem.  Skin: Negative for color change and rash.  Neurological: Negative for dizziness and headaches.  Hematological: Negative for adenopathy. Does not bruise/bleed easily.  Psychiatric/Behavioral: Negative for suicidal ideas, sleep disturbance and dysphoric mood. The patient is not nervous/anxious.        Objective:   Physical Exam  Constitutional: She is oriented to person, place, and time. She appears well-developed and well-nourished. No distress.  HENT:  Head: Normocephalic and atraumatic.  Right Ear: External ear normal.  Left Ear: External ear normal.  Nose: Nose normal.  Mouth/Throat: Oropharynx is clear and moist. No oropharyngeal exudate.  Eyes: Conjunctivae are normal. Pupils are equal, round, and reactive to light. Right eye exhibits no discharge. Left eye exhibits no discharge. No scleral icterus.  Neck: Normal range of motion. Neck supple. No tracheal deviation present. No thyromegaly present.  Cardiovascular: Normal rate, regular rhythm, normal heart sounds and intact distal pulses.   Extrasystoles are present. Exam reveals no gallop and no friction rub.   No murmur heard. Pulmonary/Chest: Effort normal and breath sounds normal. No accessory muscle usage. Not tachypneic. No respiratory distress. She has no  decreased breath sounds. She has no wheezes. She has no rhonchi. She has no rales. She exhibits no tenderness.  Musculoskeletal: Normal range of motion. She exhibits no edema and no tenderness.  Lymphadenopathy:    She has no  cervical adenopathy.  Neurological: She is alert and oriented to person, place, and time. No cranial nerve deficit. She exhibits normal muscle tone. Coordination normal.  Skin: Skin is warm and dry. No rash noted. She is not diaphoretic. No erythema. No pallor.  Psychiatric: She has a normal mood and affect. Her behavior is normal. Judgment and thought content normal.          Assessment & Plan:

## 2012-10-18 ENCOUNTER — Ambulatory Visit: Payer: Self-pay | Admitting: Internal Medicine

## 2012-11-10 ENCOUNTER — Other Ambulatory Visit: Payer: Self-pay | Admitting: *Deleted

## 2012-11-10 MED ORDER — LOSARTAN POTASSIUM 50 MG PO TABS: 50.0000 mg | ORAL_TABLET | Freq: Every day | ORAL | Status: DC

## 2012-11-10 NOTE — Telephone Encounter (Signed)
Eprescribed.

## 2012-11-15 ENCOUNTER — Encounter: Payer: Self-pay | Admitting: Internal Medicine

## 2013-01-17 ENCOUNTER — Ambulatory Visit: Payer: Medicare Other | Admitting: Internal Medicine

## 2013-01-18 ENCOUNTER — Telehealth: Payer: Self-pay | Admitting: Internal Medicine

## 2013-01-18 MED ORDER — LEVOTHYROXINE SODIUM 100 MCG PO TABS: 100.0000 ug | ORAL_TABLET | Freq: Every day | ORAL | Status: DC

## 2013-01-18 MED ORDER — NEBIVOLOL HCL 5 MG PO TABS: 5.0000 mg | ORAL_TABLET | Freq: Every day | ORAL | Status: DC

## 2013-01-18 NOTE — Telephone Encounter (Signed)
Pt is needing refill before her appointment comes next week. She needs Synthroid and Bystolic. She uses [facility].

## 2013-01-18 NOTE — Telephone Encounter (Signed)
Rx sent to [facility]

## 2013-01-20 ENCOUNTER — Encounter: Payer: Self-pay | Admitting: Internal Medicine

## 2013-01-26 ENCOUNTER — Encounter: Payer: Self-pay | Admitting: Internal Medicine

## 2013-01-26 ENCOUNTER — Ambulatory Visit (INDEPENDENT_AMBULATORY_CARE_PROVIDER_SITE_OTHER): Payer: Self-pay | Admitting: Internal Medicine

## 2013-01-26 ENCOUNTER — Telehealth: Payer: Self-pay | Admitting: *Deleted

## 2013-01-26 VITALS — BP 120/68 | HR 72 | Temp 98.0°F | Wt 187.0 lb

## 2013-01-26 DIAGNOSIS — R609 Edema, unspecified: Secondary | ICD-10-CM | POA: Insufficient documentation

## 2013-01-26 DIAGNOSIS — I10 Essential (primary) hypertension: Secondary | ICD-10-CM

## 2013-01-26 DIAGNOSIS — E039 Hypothyroidism, unspecified: Secondary | ICD-10-CM

## 2013-01-26 DIAGNOSIS — R0609 Other forms of dyspnea: Secondary | ICD-10-CM

## 2013-01-26 DIAGNOSIS — F419 Anxiety disorder, unspecified: Secondary | ICD-10-CM

## 2013-01-26 DIAGNOSIS — F411 Generalized anxiety disorder: Secondary | ICD-10-CM

## 2013-01-26 DIAGNOSIS — R5381 Other malaise: Secondary | ICD-10-CM | POA: Insufficient documentation

## 2013-01-26 DIAGNOSIS — R002 Palpitations: Secondary | ICD-10-CM

## 2013-01-26 DIAGNOSIS — N183 Chronic kidney disease, stage 3 unspecified: Secondary | ICD-10-CM

## 2013-01-26 DIAGNOSIS — R5383 Other fatigue: Secondary | ICD-10-CM

## 2013-01-26 DIAGNOSIS — R0600 Dyspnea, unspecified: Secondary | ICD-10-CM

## 2013-01-26 MED ORDER — LEVOTHYROXINE SODIUM 100 MCG PO TABS: 100.0000 ug | ORAL_TABLET | Freq: Every day | ORAL | Status: DC

## 2013-01-26 MED ORDER — FUROSEMIDE 20 MG PO TABS: 20.0000 mg | ORAL_TABLET | Freq: Every day | ORAL | Status: DC

## 2013-01-26 MED ORDER — NEBIVOLOL HCL 5 MG PO TABS: 5.0000 mg | ORAL_TABLET | Freq: Every day | ORAL | Status: DC

## 2013-01-26 NOTE — Assessment & Plan Note (Signed)
Recent worsening of symptoms of chronic lower extremity edema secondary to chronic venous insufficiency.Recommended starting compression hose during the daytime. Will refill Furosemide to use prn.

## 2013-01-26 NOTE — Telephone Encounter (Signed)
Prescription for compression stockings mailed to patient home address.

## 2013-01-26 NOTE — Assessment & Plan Note (Signed)
BP Readings from Last 3 Encounters:  01/26/13 120/68  09/30/12 124/66  09/24/12 118/68   Blood pressure well-controlled on current medications. Will continue.

## 2013-01-26 NOTE — Assessment & Plan Note (Signed)
Symptoms relatively stable. Will continue Bystolic.

## 2013-01-26 NOTE — Assessment & Plan Note (Signed)
Patient notes some ongoing symptoms of fatigue. She is having difficulty sleeping because of nasal congestion and some shortness of breath. Exam is normal today. Will check CBC, CMP, BNP, TSH, B12 with labs. Suspect that anxiety is playing a role in this. Encouraged her to be more physically active during the day. If symptoms persist, a discussed using medication at bedtime to help with sleep.

## 2013-01-26 NOTE — Assessment & Plan Note (Signed)
Symptomatic with recent fatigue. Will check TSH with labs. Continue levothyroxine.

## 2013-01-26 NOTE — Assessment & Plan Note (Signed)
Will check renal function with labs today. 

## 2013-01-26 NOTE — Progress Notes (Signed)
Subjective:    Patient ID: Whitney Simmons, female    DOB: [birth year], 79 y.o.   MRN: [id number]  HPI 78 year old female with history of hypertension, palpitations, hypothyroidism, anxiety presents for followup. She notes some recent fatigue. She attributes this to very poor sleep. She has trouble both falling asleep and staying asleep. She has been sleeping in a recliner with her head elevated because of nasal congestion which makes it difficult for her to breathe when lying flat. Her husband notes that she naps frequently during the day. She has not been exercising. She notes some ongoing anxiety about her daughter's illness. She denies any chest pain or change in symptoms of palpitations. She does not have shortness of breath during the day or on exertion. Her appetite is good.  Outpatient Encounter Prescriptions as of 01/26/2013  Medication Sig Dispense Refill  . aspirin 81 MG tablet Take 81 mg by mouth daily.      Marland Kitchen levothyroxine (SYNTHROID, LEVOTHROID) 100 MCG tablet Take 1 tablet (100 mcg total) by mouth daily.  90 tablet  4  . losartan (COZAAR) 50 MG tablet Take 1 tablet (50 mg total) by mouth daily.  30 tablet  5  . Multiple Vitamin (MULTIVITAMIN) tablet Take 1 tablet by mouth daily.      . nebivolol (BYSTOLIC) 5 MG tablet Take 1 tablet (5 mg total) by mouth daily.  90 tablet  4  . furosemide (LASIX) 20 MG tablet Take 1 tablet (20 mg total) by mouth daily.  30 tablet  3   No facility-administered encounter medications on file as of 01/26/2013.   BP 120/68  Pulse 72  Temp(Src) 98 F (36.7 C) (Oral)  Wt 187 lb (84.823 kg)  BMI 31.85 kg/m2  SpO2 94%  Review of Systems  Constitutional: Positive for fatigue. Negative for fever, chills, appetite change and unexpected weight change.  HENT: Negative for ear pain, congestion, sore throat, trouble swallowing, neck pain, voice change and sinus pressure.   Eyes: Negative for visual disturbance.  Respiratory: Positive for shortness of breath (at  night, when lying flat). Negative for cough, wheezing and stridor.   Cardiovascular: Positive for leg swelling. Negative for chest pain and palpitations.  Gastrointestinal: Negative for nausea, vomiting, abdominal pain, diarrhea, constipation, blood in stool, abdominal distention and anal bleeding.  Genitourinary: Negative for dysuria and flank pain.  Musculoskeletal: Negative for myalgias, arthralgias and gait problem.  Skin: Negative for color change and rash.  Neurological: Negative for dizziness and headaches.  Hematological: Negative for adenopathy. Does not bruise/bleed easily.  Psychiatric/Behavioral: Positive for sleep disturbance and dysphoric mood. Negative for suicidal ideas. The patient is nervous/anxious.        Objective:   Physical Exam  Constitutional: She is oriented to person, place, and time. She appears well-developed and well-nourished. No distress.  HENT:  Head: Normocephalic and atraumatic.  Right Ear: External ear normal.  Left Ear: External ear normal.  Nose: Nose normal.  Mouth/Throat: Oropharynx is clear and moist. No oropharyngeal exudate.  Eyes: Conjunctivae are normal. Pupils are equal, round, and reactive to light. Right eye exhibits no discharge. Left eye exhibits no discharge. No scleral icterus.  Neck: Normal range of motion. Neck supple. No tracheal deviation present. No thyromegaly present.  Cardiovascular: Normal rate, regular rhythm, normal heart sounds and intact distal pulses.   Occasional extrasystoles are present. Exam reveals no gallop and no friction rub.   No murmur heard. Pulmonary/Chest: Effort normal and breath sounds normal. No accessory muscle usage. Not tachypneic.  No respiratory distress. She has no decreased breath sounds. She has no wheezes. She has no rhonchi. She has no rales. She exhibits no tenderness.  Musculoskeletal: Normal range of motion. She exhibits edema (trace bilateral LE to upper shin). She exhibits no tenderness.   Lymphadenopathy:    She has no cervical adenopathy.  Neurological: She is alert and oriented to person, place, and time. No cranial nerve deficit. She exhibits normal muscle tone. Coordination normal.  Skin: Skin is warm and dry. No rash noted. She is not diaphoretic. No erythema. No pallor.  Psychiatric: Her speech is normal and behavior is normal. Judgment and thought content normal. Her mood appears anxious. Cognition and memory are normal.          Assessment & Plan:

## 2013-01-26 NOTE — Assessment & Plan Note (Signed)
Anxiety related to daughter's illness. Offered support today. Pt has not been able to tolerate SSRIs and prefers not to use medications. Will continue to monitor.

## 2013-01-26 NOTE — Assessment & Plan Note (Signed)
Mild dyspnea noted by pt when lying flat for bed. She attributes this to nasal congestion. However, question LV dysfunction. Will check BNP with labs.

## 2013-01-27 LAB — VITAMIN B12: Vitamin B-12: 322 pg/mL (ref 211–911)

## 2013-01-27 LAB — COMPREHENSIVE METABOLIC PANEL
ALT: 16 U/L (ref 0–35)
AST: 16 U/L (ref 0–37)
Albumin: 3.6 g/dL (ref 3.5–5.2)
Alkaline Phosphatase: 81 U/L (ref 39–117)
BUN: 21 mg/dL (ref 6–23)
CO2: 28 mEq/L (ref 19–32)
Calcium: 9 mg/dL (ref 8.4–10.5)
Chloride: 106 mEq/L (ref 96–112)
Creatinine, Ser: 1.2 mg/dL (ref 0.4–1.2)
GFR: 45.58 mL/min — ABNORMAL LOW (ref >60.00)
Glucose, Bld: 99 mg/dL (ref 70–99)
Potassium: 4.1 mEq/L (ref 3.5–5.1)
Sodium: 141 mEq/L (ref 135–145)
Total Bilirubin: 0.5 mg/dL (ref 0.3–1.2)
Total Protein: 7 g/dL (ref 6.0–8.3)

## 2013-01-27 LAB — CBC WITH DIFFERENTIAL/PLATELET
Basophils Absolute: 0 10*3/uL (ref 0.0–0.1)
Basophils Relative: 0.3 % (ref 0.0–3.0)
Eosinophils Absolute: 0.2 10*3/uL (ref 0.0–0.7)
Eosinophils Relative: 1.8 % (ref 0.0–5.0)
HCT: 38.2 % (ref 36.0–46.0)
Hemoglobin: 12.4 g/dL (ref 12.0–15.0)
Lymphocytes Relative: 21.3 % (ref 12.0–46.0)
Lymphs Abs: 2.1 10*3/uL (ref 0.7–4.0)
MCHC: 32.5 g/dL (ref 30.0–36.0)
MCV: 90.4 fl (ref 78.0–100.0)
Monocytes Absolute: 1 10*3/uL (ref 0.1–1.0)
Monocytes Relative: 9.8 % (ref 3.0–12.0)
Neutro Abs: 6.7 10*3/uL (ref 1.4–7.7)
Neutrophils Relative %: 66.8 % (ref 43.0–77.0)
Platelets: 81 10*3/uL — ABNORMAL LOW (ref 150.0–400.0)
RBC: 4.23 Mil/uL (ref 3.87–5.11)
RDW: 14.1 % (ref 11.5–14.6)
WBC: 10 10*3/uL (ref 4.5–10.5)

## 2013-01-27 LAB — MICROALBUMIN / CREATININE URINE RATIO
Creatinine,U: 188.4 mg/dL
Microalb Creat Ratio: 0.1 mg/g (ref 0.0–30.0)
Microalb, Ur: 0.2 mg/dL (ref 0.0–1.9)

## 2013-01-27 LAB — TSH: TSH: 1.63 u[IU]/mL (ref 0.35–5.50)

## 2013-01-27 LAB — BRAIN NATRIURETIC PEPTIDE: Pro B Natriuretic peptide (BNP): 48 pg/mL (ref 0.0–100.0)

## 2013-01-28 ENCOUNTER — Other Ambulatory Visit (INDEPENDENT_AMBULATORY_CARE_PROVIDER_SITE_OTHER): Payer: Medicare Other

## 2013-01-28 ENCOUNTER — Telehealth: Payer: Self-pay | Admitting: *Deleted

## 2013-01-28 DIAGNOSIS — R5381 Other malaise: Secondary | ICD-10-CM

## 2013-01-28 DIAGNOSIS — R5383 Other fatigue: Secondary | ICD-10-CM

## 2013-01-28 NOTE — Telephone Encounter (Signed)
Pt is really worried and would the results to be sent to her mychart asap

## 2013-01-28 NOTE — Telephone Encounter (Signed)
FYI to Dr. Walker 

## 2013-01-29 LAB — CBC WITH DIFFERENTIAL/PLATELET
Basophils Absolute: 0.1 10*3/uL (ref 0.0–0.1)
Basophils Relative: 1 % (ref 0–1)
Eosinophils Absolute: 0.2 10*3/uL (ref 0.0–0.7)
Eosinophils Relative: 2 % (ref 0–5)
HCT: 36.7 % (ref 36.0–46.0)
Hemoglobin: 12.1 g/dL (ref 12.0–15.0)
Lymphocytes Relative: 21 % (ref 12–46)
Lymphs Abs: 1.7 10*3/uL (ref 0.7–4.0)
MCH: 28.5 pg (ref 26.0–34.0)
MCHC: 33 g/dL (ref 30.0–36.0)
MCV: 86.6 fL (ref 78.0–100.0)
Monocytes Absolute: 1 10*3/uL (ref 0.1–1.0)
Monocytes Relative: 13 % — ABNORMAL HIGH (ref 3–12)
Neutro Abs: 5.2 10*3/uL (ref 1.7–7.7)
Neutrophils Relative %: 63 % (ref 43–77)
Platelets: 105 10*3/uL — ABNORMAL LOW (ref 150–400)
RBC: 4.24 MIL/uL (ref 3.87–5.11)
RDW: 14.4 % (ref 11.5–15.5)
WBC: 8.1 10*3/uL (ref 4.0–10.5)

## 2013-01-31 ENCOUNTER — Other Ambulatory Visit: Payer: Self-pay | Admitting: Internal Medicine

## 2013-01-31 DIAGNOSIS — D696 Thrombocytopenia, unspecified: Secondary | ICD-10-CM

## 2013-02-04 ENCOUNTER — Encounter: Payer: Self-pay | Admitting: Internal Medicine

## 2013-02-14 ENCOUNTER — Ambulatory Visit: Payer: Self-pay | Admitting: Internal Medicine

## 2013-02-14 LAB — CBC CANCER CENTER
Basophil #: 0.1 x10 3/mm (ref 0.0–0.1)
Basophil %: 1 %
Eosinophil #: 0.1 x10 3/mm (ref 0.0–0.7)
Eosinophil %: 1.2 %
HCT: 38.2 % (ref 35.0–47.0)
HGB: 12.8 g/dL (ref 12.0–16.0)
Lymphocyte #: 1.7 x10 3/mm (ref 1.0–3.6)
Lymphocyte %: 18.2 %
MCH: 29.4 pg (ref 26.0–34.0)
MCHC: 33.4 g/dL (ref 32.0–36.0)
MCV: 88 fL (ref 80–100)
Monocyte #: 0.9 x10 3/mm (ref 0.2–0.9)
Monocyte %: 9.7 %
Neutrophil #: 6.5 x10 3/mm (ref 1.4–6.5)
Neutrophil %: 69.9 %
Platelet: 256 x10 3/mm (ref 150–440)
RBC: 4.34 10*6/uL (ref 3.80–5.20)
RDW: 13.9 % (ref 11.5–14.5)
WBC: 9.3 x10 3/mm (ref 3.6–11.0)

## 2013-02-28 ENCOUNTER — Ambulatory Visit: Payer: Self-pay | Admitting: Internal Medicine

## 2013-04-25 ENCOUNTER — Telehealth: Payer: Self-pay | Admitting: Emergency Medicine

## 2013-04-25 NOTE — Telephone Encounter (Signed)
Patient walked in stating she has tried a Land here in town and is not feeling any better. Her back continues to hurt. She states her community in which she lives offers physical therapy, she is wonder if we could give her a script for PT. She lives at [facility] at [address]. Please advise.

## 2013-04-25 NOTE — Telephone Encounter (Signed)
LVM for patient to call our office  

## 2013-04-25 NOTE — Telephone Encounter (Signed)
Pt wasn't happy that she would have to come in for a visit. She didn't want to have to spend any more money when she is coming in, about two weeks from now. "She will tough it out until then, but feels she needs help" I explained to her she hasn't been seen since July 30th and things change and that you wanted to evaluate her. Pt wasn't happy and I apologized to her that I couldn't do anything other than make her an apt and she hung up.

## 2013-04-25 NOTE — Telephone Encounter (Signed)
We should see her in a visit first.

## 2013-05-05 ENCOUNTER — Other Ambulatory Visit: Payer: Self-pay

## 2013-05-11 ENCOUNTER — Ambulatory Visit: Payer: Medicare Other | Admitting: Internal Medicine

## 2013-05-12 ENCOUNTER — Ambulatory Visit: Payer: Self-pay | Admitting: Internal Medicine

## 2013-06-02 ENCOUNTER — Encounter: Payer: Self-pay | Admitting: Internal Medicine

## 2013-06-02 ENCOUNTER — Ambulatory Visit (INDEPENDENT_AMBULATORY_CARE_PROVIDER_SITE_OTHER): Payer: Medicare Other | Admitting: Podiatrist

## 2013-06-02 ENCOUNTER — Ambulatory Visit (INDEPENDENT_AMBULATORY_CARE_PROVIDER_SITE_OTHER): Payer: Medicare Other | Admitting: Internal Medicine

## 2013-06-02 ENCOUNTER — Encounter: Payer: Self-pay | Admitting: Podiatrist

## 2013-06-02 VITALS — BP 117/52 | HR 79 | Resp 16 | Ht 67.0 in | Wt 190.0 lb

## 2013-06-02 VITALS — BP 128/70 | HR 72 | Temp 97.7°F | Wt 190.0 lb

## 2013-06-02 DIAGNOSIS — M79609 Pain in unspecified limb: Secondary | ICD-10-CM

## 2013-06-02 DIAGNOSIS — B351 Tinea unguium: Secondary | ICD-10-CM

## 2013-06-02 DIAGNOSIS — R609 Edema, unspecified: Secondary | ICD-10-CM

## 2013-06-02 DIAGNOSIS — I10 Essential (primary) hypertension: Secondary | ICD-10-CM

## 2013-06-02 DIAGNOSIS — N183 Chronic kidney disease, stage 3 unspecified: Secondary | ICD-10-CM

## 2013-06-02 DIAGNOSIS — D696 Thrombocytopenia, unspecified: Secondary | ICD-10-CM

## 2013-06-02 DIAGNOSIS — E039 Hypothyroidism, unspecified: Secondary | ICD-10-CM

## 2013-06-02 LAB — POCT URINALYSIS DIPSTICK
Bilirubin, UA: NEGATIVE
Blood, UA: NEGATIVE
Glucose, UA: NEGATIVE
Ketones, UA: NEGATIVE
Nitrite, UA: NEGATIVE
Protein, UA: NEGATIVE
Spec Grav, UA: 1.015
Urobilinogen, UA: 0.2
pH, UA: 6.5

## 2013-06-02 MED ORDER — FUROSEMIDE 20 MG PO TABS: 20.0000 mg | ORAL_TABLET | Freq: Every day | ORAL | Status: DC

## 2013-06-02 NOTE — Assessment & Plan Note (Signed)
Will check renal function with labs. 

## 2013-06-02 NOTE — Assessment & Plan Note (Signed)
Symptoms relatively well controlled with lasix. Encouraged use of compression stockings.

## 2013-06-02 NOTE — Progress Notes (Signed)
Subjective:    Patient ID: Whitney Simmons, female    DOB: 23-Apr-1928, 79 y.o.   MRN: [id number]  HPI 79 year old female with history of hypertension, chronic kidney disease, hypothyroidism presents for followup. She reports that she has generally been doing well. Her symptoms of anxiety have improved as her daughter has done well with treatment for leukemia. She is compliant with her medications. She continues to have intermittent swelling in her lower legs which improves with use of Lasix or elevating her legs. She has not been able to tolerate compression stockings. She denies any new concerns today.  Outpatient Encounter Prescriptions as of 06/02/2013  Medication Sig  . aspirin 81 MG tablet Take 81 mg by mouth daily.  . furosemide (LASIX) 20 MG tablet Take 1 tablet (20 mg total) by mouth daily.  Marland Kitchen levothyroxine (SYNTHROID, LEVOTHROID) 100 MCG tablet Take 1 tablet (100 mcg total) by mouth daily.  Marland Kitchen losartan (COZAAR) 50 MG tablet Take 1 tablet (50 mg total) by mouth daily.  . Multiple Vitamin (MULTIVITAMIN) tablet Take 1 tablet by mouth daily.  . nebivolol (BYSTOLIC) 5 MG tablet Take 1 tablet (5 mg total) by mouth daily.    Review of Systems  Constitutional: Negative for fever, chills, appetite change, fatigue and unexpected weight change.  HENT: Negative for congestion, ear pain, sinus pressure, sore throat, trouble swallowing and voice change.   Eyes: Negative for visual disturbance.  Respiratory: Negative for cough, shortness of breath, wheezing and stridor.   Cardiovascular: Negative for chest pain, palpitations and leg swelling.  Gastrointestinal: Negative for nausea, vomiting, abdominal pain, diarrhea, constipation, blood in stool, abdominal distention and anal bleeding.  Genitourinary: Negative for dysuria and flank pain.  Musculoskeletal: Negative for arthralgias, gait problem, myalgias and neck pain.  Skin: Negative for color change and rash.  Neurological: Negative for dizziness  and headaches.  Hematological: Negative for adenopathy. Does not bruise/bleed easily.  Psychiatric/Behavioral: Negative for suicidal ideas, sleep disturbance and dysphoric mood. The patient is not nervous/anxious.        Objective:   Physical Exam  Constitutional: She is oriented to person, place, and time. She appears well-developed and well-nourished. No distress.  HENT:  Head: Normocephalic and atraumatic.  Right Ear: External ear normal.  Left Ear: External ear normal.  Nose: Nose normal.  Mouth/Throat: Oropharynx is clear and moist. No oropharyngeal exudate.  Eyes: Conjunctivae are normal. Pupils are equal, round, and reactive to light. Right eye exhibits no discharge. Left eye exhibits no discharge. No scleral icterus.  Neck: Normal range of motion. Neck supple. No tracheal deviation present. No thyromegaly present.  Cardiovascular: Normal rate, regular rhythm, normal heart sounds and intact distal pulses.  Exam reveals no gallop and no friction rub.   No murmur heard. Pulmonary/Chest: Effort normal and breath sounds normal. No accessory muscle usage. Not tachypneic. No respiratory distress. She has no decreased breath sounds. She has no wheezes. She has no rhonchi. She has no rales. She exhibits no tenderness.  Musculoskeletal: Normal range of motion. She exhibits no edema and no tenderness.  Lymphadenopathy:    She has no cervical adenopathy.  Neurological: She is alert and oriented to person, place, and time. No cranial nerve deficit. She exhibits normal muscle tone. Coordination normal.  Skin: Skin is warm and dry. No rash noted. She is not diaphoretic. No erythema. No pallor.  Psychiatric: She has a normal mood and affect. Her behavior is normal. Judgment and thought content normal.  Assessment & Plan:

## 2013-06-02 NOTE — Progress Notes (Signed)
Pre-visit discussion using our clinic review tool. No additional management support is needed unless otherwise documented below in the visit note.  

## 2013-06-02 NOTE — Progress Notes (Signed)
   HPI:  Patient presents today for follow up of foot and nail care. Denies any new complaints today.  Objective:  Patients chart is reviewed.  Neurovascular status unchanged.  Patients nails are thickened, discolored, distrophic, friable and brittle with yellow-brown discoloration. Patient subjectively relates they are painful with shoes and with ambulation of bilateral feet.Decreased fat pad noted plantar feet bilateral.  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  Right hallux nail is incurvated and uncomfortable  Assessment:  Symptomatic onychomycosis  Plan:  Discussed treatment options and alternatives.  The symptomatic toenails were debrided through manual an mechanical means without complication.  Return appointment recommended at routine intervals of 3 months recommended updated inserts for her SAS shoes    Marlowe Aschoff, DPM

## 2013-06-02 NOTE — Assessment & Plan Note (Signed)
BP Readings from Last 3 Encounters:  06/02/13 128/70  06/02/13 117/52  01/26/13 120/68   BP well controlled on current medication. Will continue.

## 2013-06-02 NOTE — Patient Instructions (Signed)
Order some new Inserts for your SAS shoes.    Lambswool is good to protect toes-- you can find this at most drug stores in the foot care isle.      GENERAL FOOT HEALTH INFORMATION:  Moisturize your feet regularly with a cream based lotion such as Cetaphil (Cream) or Eucerin (Cream)- usually available in a tub or crock type of container.  Avoid applying the cream to the toe interspaces themselves to reduce the risk of a fungal infection between the toes.  After showering or bathing be sure to dry well between your toes.     Watch your toenails for any signs of infection including drainage, pus redness or swelling along the sides of the toenails.  Soak in epsom salt water and use antibiotic ointment (OTC) if you notice this start to occur.  If the redness does not resolve within 2-3 days, call for an appointment to be seen.

## 2013-06-03 ENCOUNTER — Other Ambulatory Visit: Payer: Self-pay | Admitting: *Deleted

## 2013-06-03 ENCOUNTER — Ambulatory Visit: Payer: Medicare Other

## 2013-06-03 ENCOUNTER — Telehealth: Payer: Self-pay | Admitting: *Deleted

## 2013-06-03 DIAGNOSIS — D696 Thrombocytopenia, unspecified: Secondary | ICD-10-CM

## 2013-06-03 LAB — COMPREHENSIVE METABOLIC PANEL
ALT: 16 U/L (ref 0–35)
AST: 17 U/L (ref 0–37)
Albumin: 3.5 g/dL (ref 3.5–5.2)
Alkaline Phosphatase: 70 U/L (ref 39–117)
BUN: 15 mg/dL (ref 6–23)
CO2: 28 mEq/L (ref 19–32)
Calcium: 8.8 mg/dL (ref 8.4–10.5)
Chloride: 105 mEq/L (ref 96–112)
Creatinine, Ser: 1.4 mg/dL — ABNORMAL HIGH (ref 0.4–1.2)
GFR: 38.12 mL/min — ABNORMAL LOW (ref >60.00)
Glucose, Bld: 92 mg/dL (ref 70–99)
Potassium: 4.4 mEq/L (ref 3.5–5.1)
Sodium: 140 mEq/L (ref 135–145)
Total Bilirubin: 0.6 mg/dL (ref 0.3–1.2)
Total Protein: 6.9 g/dL (ref 6.0–8.3)

## 2013-06-03 LAB — CBC WITH DIFFERENTIAL/PLATELET
Basophils Relative: 3 % — ABNORMAL HIGH (ref 0–1)
Eosinophils Relative: 4 % (ref 0–5)
HCT: 36.7 % (ref 36.0–46.0)
Hemoglobin: 12.4 g/dL (ref 12.0–15.0)
Lymphocytes Relative: 29 % (ref 12–46)
MCH: 28.8 pg (ref 26.0–34.0)
MCHC: 33.8 g/dL (ref 30.0–36.0)
MCV: 85.2 fL (ref 78.0–100.0)
Monocytes Relative: 11 % (ref 3–12)
Neutrophils Relative %: 53 % (ref 43–77)
RBC: 4.31 MIL/uL (ref 3.87–5.11)
RDW: 14.2 % (ref 11.5–15.5)
WBC: 7.6 10*3/uL (ref 4.0–10.5)

## 2013-06-03 LAB — TSH: TSH: 0.63 u[IU]/mL (ref 0.35–5.50)

## 2013-06-03 LAB — URINE CULTURE
Colony Count: NO GROWTH
Organism ID, Bacteria: NO GROWTH

## 2013-06-03 NOTE — Telephone Encounter (Signed)
Clydie Braun from [facility] called and she said that even with the blue top the platelets clumped and that she was going to send it to [facility] since they have other methods    FYI

## 2013-06-30 HISTORY — PX: BREAST LUMPECTOMY: SHX2

## 2013-09-01 ENCOUNTER — Ambulatory Visit (INDEPENDENT_AMBULATORY_CARE_PROVIDER_SITE_OTHER): Payer: Medicare Other | Admitting: Podiatrist

## 2013-09-01 ENCOUNTER — Encounter: Payer: Self-pay | Admitting: Podiatrist

## 2013-09-01 VITALS — BP 124/62 | HR 66 | Resp 16 | Ht 67.0 in | Wt 184.0 lb

## 2013-09-01 DIAGNOSIS — B351 Tinea unguium: Secondary | ICD-10-CM

## 2013-09-01 DIAGNOSIS — M79609 Pain in unspecified limb: Secondary | ICD-10-CM

## 2013-09-01 NOTE — Progress Notes (Signed)
HPI: Patient presents today for follow up of foot and nail care. Denies any new complaints today.  Objective: Patients chart is reviewed. Neurovascular status unchanged. Pedal pulses palpable at 1/4 dp and pt bilateral. Patients nails are thickened, discolored, distrophic, friable and brittle with yellow-brown discoloration. Patient subjectively relates they are painful with shoes and with ambulation of bilateral feet. Decreased fat pad noted plantar feet bilateral. 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 Right hallux nail is incurvated and uncomfortable  Assessment: Symptomatic onychomycosis x 9  Plan: Discussed treatment options and alternatives. The symptomatic toenails were debrided through manual an mechanical means without complication. Return appointment recommended at routine intervals of 3 month. Recommended to call if any problems or concerns arise   

## 2013-10-10 ENCOUNTER — Telehealth: Payer: Self-pay | Admitting: Internal Medicine

## 2013-10-10 MED ORDER — LOSARTAN POTASSIUM 50 MG PO TABS: 50.0000 mg | ORAL_TABLET | Freq: Every day | ORAL | Status: DC

## 2013-10-10 NOTE — Telephone Encounter (Signed)
Pt states her next appt is in June and she needs a refill of her losartan. S tates she will need enough to get her through to her appt in June, and she will run out of medication this week.

## 2013-10-10 NOTE — Telephone Encounter (Signed)
Rx sent to pharmacy on file per patient request. Patient aware prescription was sent to pharmacy

## 2013-12-01 ENCOUNTER — Ambulatory Visit: Payer: Medicare Other | Admitting: Podiatrist

## 2013-12-05 ENCOUNTER — Telehealth: Payer: Self-pay | Admitting: Internal Medicine

## 2013-12-05 DIAGNOSIS — Z1239 Encounter for other screening for malignant neoplasm of breast: Secondary | ICD-10-CM

## 2013-12-05 NOTE — Telephone Encounter (Signed)
Pt states she tried to schedule her mammogram with [facility] but was told she would have to call her PCP to have it scheduled.  No order in.  [facility], early in a.m. preferred, most any day.  Not 6/15 - 6/19.

## 2013-12-09 ENCOUNTER — Encounter: Payer: Self-pay | Admitting: Internal Medicine

## 2013-12-09 ENCOUNTER — Ambulatory Visit (INDEPENDENT_AMBULATORY_CARE_PROVIDER_SITE_OTHER): Payer: Medicare Other | Admitting: Internal Medicine

## 2013-12-09 VITALS — BP 126/60 | HR 61 | Temp 97.9°F | Ht 64.25 in | Wt 187.8 lb

## 2013-12-09 DIAGNOSIS — R609 Edema, unspecified: Secondary | ICD-10-CM

## 2013-12-09 DIAGNOSIS — K589 Irritable bowel syndrome without diarrhea: Secondary | ICD-10-CM | POA: Insufficient documentation

## 2013-12-09 DIAGNOSIS — F419 Anxiety disorder, unspecified: Secondary | ICD-10-CM

## 2013-12-09 DIAGNOSIS — N183 Chronic kidney disease, stage 3 unspecified: Secondary | ICD-10-CM

## 2013-12-09 DIAGNOSIS — F411 Generalized anxiety disorder: Secondary | ICD-10-CM

## 2013-12-09 DIAGNOSIS — E039 Hypothyroidism, unspecified: Secondary | ICD-10-CM

## 2013-12-09 DIAGNOSIS — I10 Essential (primary) hypertension: Secondary | ICD-10-CM

## 2013-12-09 DIAGNOSIS — E785 Hyperlipidemia, unspecified: Secondary | ICD-10-CM

## 2013-12-09 DIAGNOSIS — Z1239 Encounter for other screening for malignant neoplasm of breast: Secondary | ICD-10-CM

## 2013-12-09 DIAGNOSIS — Z23 Encounter for immunization: Secondary | ICD-10-CM

## 2013-12-09 MED ORDER — FUROSEMIDE 20 MG PO TABS: 20.0000 mg | ORAL_TABLET | Freq: Every day | ORAL | Status: DC

## 2013-12-09 MED ORDER — LOSARTAN POTASSIUM 50 MG PO TABS: 50.0000 mg | ORAL_TABLET | Freq: Every day | ORAL | Status: DC

## 2013-12-09 MED ORDER — LEVOTHYROXINE SODIUM 100 MCG PO TABS: 100.0000 ug | ORAL_TABLET | Freq: Every day | ORAL | Status: DC

## 2013-12-09 MED ORDER — NEBIVOLOL HCL 5 MG PO TABS: 5.0000 mg | ORAL_TABLET | Freq: Every day | ORAL | Status: DC

## 2013-12-09 NOTE — Assessment & Plan Note (Signed)
Symptoms consistent with IBS. Discussed use of FODMAP diet, probiotics, and prn use of Imodium.  If symptoms persistent or not improved with these interventions, will set up follow up with GI.

## 2013-12-09 NOTE — Progress Notes (Signed)
Pre visit review using our clinic review tool, if applicable. No additional management support is needed unless otherwise documented below in the visit note. 

## 2013-12-09 NOTE — Assessment & Plan Note (Addendum)
Will check renal function with labs. 

## 2013-12-09 NOTE — Assessment & Plan Note (Signed)
Will check lipids with labs. 

## 2013-12-09 NOTE — Patient Instructions (Signed)
Consider starting the FODMAP diet to help with diarrhea. You can also use Imodium as needed.  Consider purchasing a rolling [place] Vokes to help with stability while walking.  Labs today.  Follow up in 4 weeks.

## 2013-12-09 NOTE — Assessment & Plan Note (Signed)
Will check TSH with labs. Continue Levothyroxine. 

## 2013-12-09 NOTE — Assessment & Plan Note (Signed)
BP Readings from Last 3 Encounters:  12/09/13 126/60  09/01/13 124/62  06/02/13 128/70   BP well controlled on Losartan and Bystolic. Will continue.

## 2013-12-09 NOTE — Assessment & Plan Note (Signed)
Recent worsening symptoms of anxiety and depressed mood. Offered support today. Encouraged her to seek activities outside of her home to allow her some free time. Follow up in 4 weeks and prn.

## 2013-12-09 NOTE — Progress Notes (Signed)
Subjective:    Patient ID: Whitney Simmons, female    DOB: [birth year], 80 y.o.   MRN: [id number]  HPI 80YO female presents for follow up.  Physically, feeling well except for occasional watery stool which has been ongoing for years. Difficult for her to tell which foods might be triggering this. Questions if she could take medication on occasion to help with diarrhea. No abdominal pain, change in appetite, weight loss. Colonoscopy is UTD.  Tearful describing frustration with her husband. Would like for him to be more independent. Would like time to herself. Feeling more anxious over last several months because of this. Also notes tearfulness and depressed mood at times.  Review of Systems  Constitutional: Negative for fever, chills, appetite change, fatigue and unexpected weight change.  HENT: Negative for congestion, ear pain, sinus pressure, sore throat, trouble swallowing and voice change.   Eyes: Negative for visual disturbance.  Respiratory: Negative for cough, shortness of breath, wheezing and stridor.   Cardiovascular: Negative for chest pain, palpitations and leg swelling.  Gastrointestinal: Positive for diarrhea. Negative for nausea, vomiting, abdominal pain, constipation, blood in stool, abdominal distention and anal bleeding.  Genitourinary: Negative for dysuria and flank pain.  Musculoskeletal: Negative for arthralgias, gait problem, myalgias and neck pain.  Skin: Negative for color change and rash.  Neurological: Negative for dizziness and headaches.  Hematological: Negative for adenopathy. Does not bruise/bleed easily.  Psychiatric/Behavioral: Positive for dysphoric mood. Negative for suicidal ideas and sleep disturbance. The patient is not nervous/anxious.        Objective:    BP 126/60  Pulse 61  Temp(Src) 97.9 F (36.6 C) (Oral)  Ht 5' 4.25" (1.632 m)  Wt 187 lb 12 oz (85.163 kg)  BMI 31.98 kg/m2  SpO2 95% Physical Exam  Constitutional: She is oriented to person,  place, and time. She appears well-developed and well-nourished. No distress.  HENT:  Head: Normocephalic and atraumatic.  Right Ear: External ear normal.  Left Ear: External ear normal.  Nose: Nose normal.  Mouth/Throat: Oropharynx is clear and moist. No oropharyngeal exudate.  Eyes: Conjunctivae are normal. Pupils are equal, round, and reactive to light. Right eye exhibits no discharge. Left eye exhibits no discharge. No scleral icterus.  Neck: Normal range of motion. Neck supple. No tracheal deviation present. No thyromegaly present.  Cardiovascular: Normal rate, regular rhythm, normal heart sounds and intact distal pulses.  Exam reveals no gallop and no friction rub.   No murmur heard. Pulmonary/Chest: Effort normal and breath sounds normal. No accessory muscle usage. Not tachypneic. No respiratory distress. She has no decreased breath sounds. She has no wheezes. She has no rhonchi. She has no rales. She exhibits no tenderness.  Abdominal: Soft. Bowel sounds are normal. She exhibits no distension and no mass. There is no tenderness. There is no rebound and no guarding.  Musculoskeletal: Normal range of motion. She exhibits no edema and no tenderness.  Lymphadenopathy:    She has no cervical adenopathy.  Neurological: She is alert and oriented to person, place, and time. No cranial nerve deficit. She exhibits normal muscle tone. Coordination normal.  Skin: Skin is warm and dry. No rash noted. She is not diaphoretic. No erythema. No pallor.  Psychiatric: Her speech is normal and behavior is normal. Judgment and thought content normal. She exhibits a depressed mood. She expresses no suicidal ideation.          Assessment & Plan:   Problem List Items Addressed This Visit  Unprioritized   Anxiety     Recent worsening symptoms of anxiety and depressed mood. Offered support today. Encouraged her to seek activities outside of her home to allow her some free time. Follow up in 4 weeks and  prn.    Chronic kidney disease, stage III (moderate)     Will check renal function with labs.    Relevant Orders      CBC with Differential   Edema   Relevant Medications      furosemide (LASIX) tablet   Hyperlipidemia     Will check lipids with labs.    Relevant Medications      nebivolol (BYSTOLIC) tablet      losartan (COZAAR) tablet      furosemide (LASIX) tablet   Other Relevant Orders      Comprehensive metabolic panel      Lipid panel   Hypertension      BP Readings from Last 3 Encounters:  12/09/13 126/60  09/01/13 124/62  06/02/13 128/70   BP well controlled on Losartan and Bystolic. Will continue.    Relevant Medications      nebivolol (BYSTOLIC) tablet      losartan (COZAAR) tablet      furosemide (LASIX) tablet   Hypothyroidism     Will check TSH with labs. Continue Levothyroxine.    Relevant Medications      nebivolol (BYSTOLIC) tablet      levothyroxine (SYNTHROID, LEVOTHROID) tablet   Other Relevant Orders      TSH   IBS (irritable bowel syndrome) - Primary     Symptoms consistent with IBS. Discussed use of FODMAP diet, probiotics, and prn use of Imodium.  If symptoms persistent or not improved with these interventions, will set up follow up with GI.     Other Visit Diagnoses   Screening for breast cancer        Relevant Orders       MM Digital Screening    Need for prophylactic vaccination against Streptococcus pneumoniae (pneumococcus)        Relevant Orders       Pneumococcal conjugate vaccine 13-valent (Completed)        Return in about 4 weeks (around 01/06/2014) for Wellness Visit.

## 2013-12-10 LAB — CBC WITH DIFFERENTIAL/PLATELET
Basophils Absolute: 0.1 10*3/uL (ref 0.0–0.1)
Basophils Relative: 1 % (ref 0–1)
Eosinophils Absolute: 0.2 10*3/uL (ref 0.0–0.7)
Eosinophils Relative: 2 % (ref 0–5)
HCT: 37.7 % (ref 36.0–46.0)
Hemoglobin: 12.9 g/dL (ref 12.0–15.0)
Lymphocytes Relative: 24 % (ref 12–46)
Lymphs Abs: 1.9 10*3/uL (ref 0.7–4.0)
MCH: 29.2 pg (ref 26.0–34.0)
MCHC: 34.2 g/dL (ref 30.0–36.0)
MCV: 85.3 fL (ref 78.0–100.0)
Monocytes Absolute: 1 10*3/uL (ref 0.1–1.0)
Monocytes Relative: 12 % (ref 3–12)
Neutro Abs: 4.9 10*3/uL (ref 1.7–7.7)
Neutrophils Relative %: 61 % (ref 43–77)
Platelets: 91 10*3/uL — ABNORMAL LOW (ref 150–400)
RBC: 4.42 MIL/uL (ref 3.87–5.11)
RDW: 14 % (ref 11.5–15.5)
WBC: 8 10*3/uL (ref 4.0–10.5)

## 2013-12-10 LAB — LIPID PANEL
Cholesterol: 193 mg/dL (ref 0–200)
HDL: 36 mg/dL — ABNORMAL LOW (ref >39)
LDL Cholesterol: 107 mg/dL — ABNORMAL HIGH (ref 0–99)
Total CHOL/HDL Ratio: 5.4 Ratio
Triglycerides: 249 mg/dL — ABNORMAL HIGH (ref <150)
VLDL: 50 mg/dL — ABNORMAL HIGH (ref 0–40)

## 2013-12-10 LAB — COMPREHENSIVE METABOLIC PANEL
ALT: 11 U/L (ref 0–35)
AST: 17 U/L (ref 0–37)
Albumin: 3.9 g/dL (ref 3.5–5.2)
Alkaline Phosphatase: 83 U/L (ref 39–117)
BUN: 18 mg/dL (ref 6–23)
CO2: 25 mEq/L (ref 19–32)
Calcium: 8.9 mg/dL (ref 8.4–10.5)
Chloride: 102 mEq/L (ref 96–112)
Creat: 1.09 mg/dL (ref 0.50–1.10)
Glucose, Bld: 97 mg/dL (ref 70–99)
Potassium: 5.1 mEq/L (ref 3.5–5.3)
Sodium: 137 mEq/L (ref 135–145)
Total Bilirubin: 0.4 mg/dL (ref 0.2–1.2)
Total Protein: 6.7 g/dL (ref 6.0–8.3)

## 2013-12-10 LAB — TSH: TSH: 0.424 u[IU]/mL (ref 0.350–4.500)

## 2013-12-15 ENCOUNTER — Ambulatory Visit: Payer: Medicare Other | Admitting: Podiatrist

## 2013-12-22 ENCOUNTER — Ambulatory Visit (INDEPENDENT_AMBULATORY_CARE_PROVIDER_SITE_OTHER): Payer: Medicare Other | Admitting: Podiatrist

## 2013-12-22 VITALS — BP 122/57 | HR 85 | Resp 16

## 2013-12-22 DIAGNOSIS — B351 Tinea unguium: Secondary | ICD-10-CM

## 2013-12-22 DIAGNOSIS — M79609 Pain in unspecified limb: Secondary | ICD-10-CM

## 2013-12-22 DIAGNOSIS — M79673 Pain in unspecified foot: Secondary | ICD-10-CM

## 2013-12-22 NOTE — Progress Notes (Signed)
HPI: Patient presents today for follow up of foot and nail care. Denies any new complaints today.  Objective: Patients chart is reviewed. Neurovascular status unchanged. Pedal pulses palpable at 1/4 dp and pt bilateral. Patients nails are thickened, discolored, distrophic, friable and brittle with yellow-brown discoloration. Patient subjectively relates they are painful with shoes and with ambulation of bilateral feet. Decreased fat pad noted plantar feet bilateral. 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 Right hallux nail is incurvated and uncomfortable  Assessment: Symptomatic onychomycosis x 9  Plan: Discussed treatment options and alternatives. The symptomatic toenails were debrided through manual an mechanical means without complication. Return appointment recommended at routine intervals of 3 month. Recommended to call if any problems or concerns arise

## 2014-01-13 ENCOUNTER — Encounter: Payer: Medicare Other | Admitting: Internal Medicine

## 2014-01-17 ENCOUNTER — Ambulatory Visit: Payer: Self-pay | Admitting: Internal Medicine

## 2014-01-17 LAB — HM MAMMOGRAPHY

## 2014-01-18 ENCOUNTER — Telehealth: Payer: Self-pay | Admitting: *Deleted

## 2014-01-18 ENCOUNTER — Ambulatory Visit: Payer: Self-pay | Admitting: Internal Medicine

## 2014-01-18 ENCOUNTER — Encounter: Payer: Self-pay | Admitting: *Deleted

## 2014-01-18 ENCOUNTER — Telehealth: Payer: Self-pay | Admitting: Internal Medicine

## 2014-01-18 NOTE — Telephone Encounter (Signed)
[facility] called and said that patient is having a biopsy done on July 29th at 1:30 and they are faxing over a paper and need a provider to sign and send it back.

## 2014-01-18 NOTE — Telephone Encounter (Signed)
Spoke with Estill Bamberg advised of MDs message

## 2014-01-18 NOTE — Telephone Encounter (Signed)
Estill Bamberg from [facility] called to report it is recommended patient have a Stereotactic biopsy of the left breast.  Would you like to refer the pt or would you like mammography to schedule the pt.  Please advise

## 2014-01-18 NOTE — Telephone Encounter (Signed)
Fine for mammography to schedule this patient for biopsy.

## 2014-01-19 ENCOUNTER — Telehealth: Payer: Self-pay

## 2014-01-19 DIAGNOSIS — R928 Other abnormal and inconclusive findings on diagnostic imaging of breast: Secondary | ICD-10-CM

## 2014-01-19 NOTE — Telephone Encounter (Signed)
Can you have one of the other docs sign an order? I am at [facility] and will not be back until tomorrow afternoon.

## 2014-01-19 NOTE — Telephone Encounter (Signed)
I placed the order for the breast biopsy and will sign and give to [facility].  This was an order for breast biopsy at [facility].  Just wanted to be sure this is what you wanted.  Thanks.

## 2014-01-19 NOTE — Telephone Encounter (Signed)
[facility] Called and they are hoping an order can be placed for the pt's Biopsy of left breast.   Thanks!

## 2014-01-20 ENCOUNTER — Ambulatory Visit (INDEPENDENT_AMBULATORY_CARE_PROVIDER_SITE_OTHER): Payer: Medicare Other | Admitting: Internal Medicine

## 2014-01-20 ENCOUNTER — Encounter: Payer: Self-pay | Admitting: Internal Medicine

## 2014-01-20 VITALS — BP 134/60 | HR 70 | Temp 98.6°F | Ht 65.0 in | Wt 189.2 lb

## 2014-01-20 DIAGNOSIS — Z0000 Encounter for general adult medical examination without abnormal findings: Secondary | ICD-10-CM

## 2014-01-20 DIAGNOSIS — R928 Other abnormal and inconclusive findings on diagnostic imaging of breast: Secondary | ICD-10-CM

## 2014-01-20 DIAGNOSIS — L989 Disorder of the skin and subcutaneous tissue, unspecified: Secondary | ICD-10-CM

## 2014-01-20 LAB — POCT URINALYSIS DIPSTICK
Bilirubin, UA: NEGATIVE
Blood, UA: NEGATIVE
Glucose, UA: NEGATIVE
Ketones, UA: NEGATIVE
Nitrite, UA: NEGATIVE
Protein, UA: NEGATIVE
Spec Grav, UA: 1.015
Urobilinogen, UA: 0.2
pH, UA: 5.5

## 2014-01-20 LAB — MICROALBUMIN / CREATININE URINE RATIO
Creatinine,U: 103.2 mg/dL
Microalb Creat Ratio: 0.2 mg/g (ref 0.0–30.0)
Microalb, Ur: 0.2 mg/dL (ref 0.0–1.9)

## 2014-01-20 NOTE — Assessment & Plan Note (Signed)
Reviewed diagnostic mammogram of left breast with pt today. Mammogram showed microcalcifications. Breast exam normal today. Will set up evaluation with Dr. Bary Castilla, as pt is extremely anxious about possibility of stereotactic biopsy and wants to consider other options.

## 2014-01-20 NOTE — Telephone Encounter (Signed)
Request was received and given to Dr. Gilford Rile for signature.

## 2014-01-20 NOTE — Patient Instructions (Signed)

## 2014-01-20 NOTE — Progress Notes (Signed)
The patient is here for annual Medicare Wellness Examination and management of other chronic and acute problems.   The risk factors are reflected in the history.  The roster of all physicians providing medical care to patient - is listed in the Snapshot section of the chart.  Activities of daily living:   The patient is 100% independent in all ADLs: dressing, toileting, feeding as well as independent mobility. Patient lives with husband. No pets. Lives at the [place] at [facility].  Home safety :  The patient has smoke detectors in the home.  They wear seatbelts in their car. There are no firearms at home.  There is no violence in the home. They feel safe where they live.  Infectious Risks: There is no risks for hepatitis, STDs or HIV.  There is no  history of blood transfusion.  They have no travel history to infectious disease endemic areas of the world.  Additional Health Care Providers: The patient has seen their dentist in the last six months. Dentist - [facility] They have seen their eye doctor in the last year. Opthalmologist - [facility] They deny hearing issues. They have deferred audiologic testing in the last year.   They do not  have excessive sun exposure. Discussed the need for sun protection: hats,long sleeves and use of sunscreen if there is significant sun exposure.  Dermatologist - none at present  Diet: the importance of a healthy diet is discussed. They do have a healthy diet.  The benefits of regular aerobic exercise were discussed. Patient exercises by walks occasionally.  Depression screen: there are no signs or vegative symptoms of depression- irritability, change in appetite, anhedonia, sadness/tearfullness.  Cognitive assessment: the patient manages all their financial and personal affairs and is actively engaged. They could relate day,date,year and events. Manages her own finances.  [facility] - yes  The following portions  of the patient's history were reviewed and updated as appropriate: allergies, current medications, past family history, past medical history,  past surgical history, past social history and problem list.  Visual acuity was not assessed per patient preference as they have regular follow up with their ophthalmologist. Hearing and body mass index were assessed and reviewed.   During the course of the visit the patient was educated and counseled about appropriate screening and preventive services including : fall prevention , diabetes screening, nutrition counseling, colorectal cancer screening, and recommended immunizations.    Concerned about left elbow skin lesion. Present for several years, but recently larger. Not painful.  Review of Systems  Constitutional: Negative for fever, chills, appetite change, fatigue and unexpected weight change.  HENT: Negative for congestion, postnasal drip, rhinorrhea, trouble swallowing and voice change.   Eyes: Negative for visual disturbance.  Respiratory: Negative for shortness of breath and wheezing.   Cardiovascular: Negative for chest pain and leg swelling.  Gastrointestinal: Negative for nausea, vomiting, abdominal pain, diarrhea, constipation and blood in stool.  Genitourinary: Negative for dysuria, urgency and frequency.  Skin: Negative for color change and rash.  Hematological: Negative for adenopathy. Does not bruise/bleed easily.  Psychiatric/Behavioral: Negative for suicidal ideas, confusion, sleep disturbance, dysphoric mood and decreased concentration. The patient is nervous/anxious.        Objective:    BP 134/60  Pulse 70  Temp(Src) 98.6 F (37 C) (Oral)  Ht 5\' 5"  (1.651 m)  Wt 189 lb 4 oz (85.843 kg)  BMI 31.49 kg/m2  SpO2 97% Physical Exam  Constitutional: She is  oriented to person, place, and time. She appears well-developed and well-nourished. No distress.  HENT:  Head: Normocephalic and atraumatic.  Right Ear: External ear  normal.  Left Ear: External ear normal.  Nose: Nose normal.  Mouth/Throat: Oropharynx is clear and moist. No oropharyngeal exudate.  Eyes: Conjunctivae are normal. Pupils are equal, round, and reactive to light. Right eye exhibits no discharge. Left eye exhibits no discharge. No scleral icterus.  Neck: Normal range of motion. Neck supple. No tracheal deviation present. No thyromegaly present.  Cardiovascular: Normal rate, regular rhythm, normal heart sounds and intact distal pulses.  Exam reveals no gallop and no friction rub.   No murmur heard. Pulmonary/Chest: Effort normal and breath sounds normal. No accessory muscle usage. Not tachypneic. No respiratory distress. She has no decreased breath sounds. She has no wheezes. She has no rhonchi. She has no rales. She exhibits no tenderness. Right breast exhibits no inverted nipple, no mass, no nipple discharge, no skin change and no tenderness. Left breast exhibits no inverted nipple, no mass, no nipple discharge, no skin change and no tenderness. Breasts are symmetrical.  Abdominal: Soft. Bowel sounds are normal. She exhibits no distension and no mass. There is no tenderness. There is no rebound and no guarding.  Musculoskeletal: Normal range of motion. She exhibits no edema and no tenderness.  Lymphadenopathy:    She has no cervical adenopathy.  Neurological: She is alert and oriented to person, place, and time. No cranial nerve deficit. She exhibits normal muscle tone. Coordination normal.  Skin: Skin is warm and dry. Lesion noted. No rash noted. She is not diaphoretic. No erythema. No pallor.     Psychiatric: She has a normal mood and affect. Her behavior is normal. Judgment and thought content normal.          Assessment & Plan:   Problem List Items Addressed This Visit     Unprioritized   Abnormal mammogram of left breast     Reviewed diagnostic mammogram of left breast with pt today. Mammogram showed microcalcifications. Breast exam  normal today. Will set up evaluation with Dr. Bary Castilla, as pt is extremely anxious about possibility of stereotactic biopsy and wants to consider other options.    Relevant Orders      Ambulatory referral to General Surgery   Medicare annual wellness visit, subsequent - Primary     General medical exam including breast  exam normal today. PAP and pelvic deferred given pt age and preference. Mammogram was abnormal and reviewed as noted with pt today. Reviewed recent labs which showed normal cholesterol, liver and kidney function. Encouraged continued efforts at healthy diet and regular physical activity.Immunizations are UTD except for Zostavax which was declined.     Relevant Orders      Microalbumin / creatinine urine ratio      POCT Urinalysis Dipstick   Skin lesion of left arm     Skin lesion most consistent with lipoma, however given change in size, will set up dermatology evaluation for biopsy.    Relevant Orders      Ambulatory referral to Dermatology       Return in about 4 weeks (around 02/17/2014) for Recheck.

## 2014-01-20 NOTE — Progress Notes (Signed)
Pre visit review using our clinic review tool, if applicable. No additional management support is needed unless otherwise documented below in the visit note. 

## 2014-01-20 NOTE — Assessment & Plan Note (Signed)
Skin lesion most consistent with lipoma, however given change in size, will set up dermatology evaluation for biopsy.

## 2014-01-20 NOTE — Assessment & Plan Note (Signed)
General medical exam including breast  exam normal today. PAP and pelvic deferred given pt age and preference. Mammogram was abnormal and reviewed as noted with pt today. Reviewed recent labs which showed normal cholesterol, liver and kidney function. Encouraged continued efforts at healthy diet and regular physical activity.Immunizations are UTD except for Zostavax which was declined.

## 2014-01-25 ENCOUNTER — Encounter: Payer: Self-pay | Admitting: General Surgery

## 2014-01-30 ENCOUNTER — Ambulatory Visit (INDEPENDENT_AMBULATORY_CARE_PROVIDER_SITE_OTHER): Payer: Medicare Other | Admitting: General Surgery

## 2014-01-30 ENCOUNTER — Encounter: Payer: Self-pay | Admitting: Internal Medicine

## 2014-01-30 ENCOUNTER — Encounter: Payer: Self-pay | Admitting: General Surgery

## 2014-01-30 VITALS — BP 130/74 | HR 72 | Resp 14 | Ht 67.0 in | Wt 184.0 lb

## 2014-01-30 DIAGNOSIS — R920 Mammographic microcalcification found on diagnostic imaging of breast: Secondary | ICD-10-CM

## 2014-01-30 NOTE — Progress Notes (Signed)
Patient ID: Whitney Simmons, female   DOB: Aug 30, 1927, 80 y.o.   MRN: [id number]  Chief Complaint  Patient presents with  . Other    mammogram    HPI Whitney Simmons is a 80 y.o. female who presents for a breast evaluation. The most recent mammogram was done on 01/18/14. Patient does perform regular self breast checks and gets regular mammograms done. The patient denies any problems with the breasts at this time. No personal or family history of breast problems.     HPI  Past Medical History  Diagnosis Date  . Irregular heart beat   . Thyroid disease     s/p radioactive iodine ablation 30 years ago  . Hypertension   . Hyperlipidemia   . Edema   . IBS (irritable bowel syndrome)     Past Surgical History  Procedure Laterality Date  . Cholecystectomy    . Cataract extraction    . Toe amputation      left 5th toe    Family History  Problem Relation Age of Onset  . Heart disease Mother   . Heart disease Maternal Aunt     Social History History  Substance Use Topics  . Smoking status: Never Smoker   . Smokeless tobacco: Never Used  . Alcohol Use: No    Allergies  Allergen Reactions  . Levaquin [Levofloxacin In D5w] Other (See Comments)    hallucinations  . Sulfa Antibiotics     Hallucinations     Current Outpatient Prescriptions  Medication Sig Dispense Refill  . aspirin 81 MG tablet Take 81 mg by mouth daily.      . furosemide (LASIX) 20 MG tablet Take 1 tablet (20 mg total) by mouth daily.  90 tablet  3  . levothyroxine (SYNTHROID, LEVOTHROID) 100 MCG tablet Take 1 tablet (100 mcg total) by mouth daily.  90 tablet  3  . losartan (COZAAR) 50 MG tablet Take 1 tablet (50 mg total) by mouth daily.  30 tablet  6  . Multiple Vitamin (MULTIVITAMIN) tablet Take 1 tablet by mouth daily.      . nebivolol (BYSTOLIC) 5 MG tablet Take 1 tablet (5 mg total) by mouth daily.  90 tablet  3   No current facility-administered medications for this visit.    Review of  Systems Review of Systems  Constitutional: Negative.   Respiratory: Negative.   Cardiovascular: Negative.     Blood pressure 130/74, pulse 72, resp. rate 14, height 5\' 7"  (1.702 m), weight 184 lb (83.462 kg).  Physical Exam Physical Exam  Constitutional: She is oriented to person, place, and time. She appears well-developed and well-nourished.  Neck: Neck supple. No thyromegaly present.  Cardiovascular: Normal rate, regular rhythm and normal heart sounds.   No murmur heard. Pulmonary/Chest: Effort normal and breath sounds normal. Right breast exhibits no inverted nipple, no mass, no nipple discharge, no skin change and no tenderness. Left breast exhibits no inverted nipple, no mass, no nipple discharge, no skin change and no tenderness.  Lymphadenopathy:    She has no cervical adenopathy.    She has no axillary adenopathy.  Neurological: She is alert and oriented to person, place, and time.  Skin: Skin is warm and dry.    Data Reviewed Bilateral screening mammograms dated 01/17/2014 as well as left breast focal spot compression views of 01/18/2014 were reviewed. A developing area of microcalcifications is noted in the upper outer quadrant of the right breast. [id number].  Assessment    Right breast microcalcifications.  Plan    The patient reports over the last couple of years she is found it mentally and possible to lay supine or prone, precluding easy stereotactic biopsy on a standard table. She is amenable to proceed to a biopsy if it is not require her to be in a prone position. Considering the interval change between 2013 2015 biopsy has been recommended to confirm the etiology of the recently identified microcalcifications.   The procedure was reviewed with the patient, and she believes that she will be able to complete this.  Followup will be determined on the results of her pathology.  Patient has been scheduled for a seated left breast stereotactic biopsy at [facility] for 02-06-14 at 1:45 pm (arrive 1:30 pm). This patient will need to discontinue 81 mg aspirin 3-4 days prior to procedure. The radiology department at [facility] will be mailing mammogram disc to [facility].     PCP: Conception Chancy 01/31/2014, 2:50 PM

## 2014-01-30 NOTE — Patient Instructions (Addendum)
Patient to be scheduled for stereotactic breast biopsy in [place]. The patient is aware to call back for any questions or concerns.  Stereotactic Breast Biopsy A stereotactic breast biopsy is a procedure in which mammography is used in the collection of a sample of breast tissue. Mammography is a type of X-ray exam of the breasts that produces an image called a mammogram. The mammogram allows your health care provider to precisely locate the area of the breast from which a tissue sample will be taken. The tissue is then examined under a microscope to see if cancerous cells are present. A breast biopsy is done when:   A lump, abnormality, or mass is seen in the breast on a breast X-ray (mammogram).   Small calcium deposits (calcifications) are seen in the breast.   The shape or appearance of the breasts changes.   The shape or appearance of the nipples changes. You may have unusual or bloody discharge coming from the nipples, or you may have crusting, retraction, or dimpling of the nipples. A breast biopsy can indicate if you need surgery or other treatment.  LET [facility] CARE PROVIDER KNOW ABOUT:  Any allergies you have.  All medicines you are taking, including vitamins, herbs, eye drops, creams, and over-the-counter medicines.  Previous problems you or members of your family have had with the use of anesthetics.  Any blood disorders you have.  Previous surgeries you have had.  Medical conditions you have. RISKS AND COMPLICATIONS Generally, stereotactic breast biopsy is a safe procedure. However, as with any procedure, complications can occur. Possible complications include:  Infection at the needle-insertion site.   Bleeding or bruising after surgery.  The breast may become altered or deformed as a result of the procedure.  The needle may go through the chest wall into the lung area.  BEFORE THE PROCEDURE  Wear a supportive bra to the procedure.  You will be asked  to remove jewelry, dentures, eyeglasses, metal objects, or clothing that might interfere with the X-ray images. You may want to leave some of these objects at home.  Arrange for someone to drive you home after the procedure if desired. PROCEDURE  A stereotactic breast biopsy is done while you are awake. During the procedure, relax as much as possible. Let your health care provider know if you are uncomfortable, anxious, or in pain. Usually, the only discomfort felt during the procedure is caused by staying in one position for the length of the procedure. This discomfort can be reduced by carefully placed cushions. Most of the time the biopsy is done using a table with openings on it. You will be asked to lie facedown on the table and place your breasts through the openings. Your breast is compressed between metal plates to get good X-ray images. Your skin will be cleaned, and a numbing medicine (local anesthetic) will be injected. A small cut (incision) will be made in your breast. The tip of the biopsy needle will be directed through the incision. Several small pieces of suspicious tissue will be taken. Then, a final set of X-ray images will be obtained. If they show that the suspicious tissue has been mostly or completely removed, a small clip will be left at the biopsy site. This is done so that the biopsy site can be easily located if the results of the biopsy show that the tissue is cancerous.  After the procedure, the incision will be stitched (sutured) or taped and covered with a bandage (dressing). Your  health care provider may apply a pressure dressing and an ice pack to prevent bleeding and swelling in the breast.  A stereotactic breast biopsy can take 30 minutes or more. AFTER THE PROCEDURE  If you are doing well and have no problems, you will be allowed to go home.  Document Released: 03/15/2003 Document Revised: 06/21/2013 Document Reviewed: 01/13/2013 [facility] Patient Information 2015  [place], [place]. This information is not intended to replace advice given to you by your health care provider. Make sure you discuss any questions you have with your health care provider.  Patient has been scheduled for a seated left breast stereotactic biopsy at [place] for 02-06-14 at 1:45 pm (arrive 1:30 pm). This patient will need to discontinue 81 mg aspirin 3-4 days prior to procedure. The radiology department at [facility] will be mailing mammogram disc to [facility].

## 2014-01-31 DIAGNOSIS — R920 Mammographic microcalcification found on diagnostic imaging of breast: Secondary | ICD-10-CM | POA: Insufficient documentation

## 2014-01-31 HISTORY — DX: Mammographic microcalcification found on diagnostic imaging of breast: R92.0

## 2014-02-06 ENCOUNTER — Other Ambulatory Visit: Payer: Self-pay

## 2014-02-09 ENCOUNTER — Telehealth: Payer: Self-pay | Admitting: *Deleted

## 2014-02-09 NOTE — Telephone Encounter (Signed)
Janett Billow called to let us know the patient had called them for the results of the stereotatic biopsy, she states she called earlier in the week to let us know the results wee in.

## 2014-02-09 NOTE — Telephone Encounter (Signed)
I called to let the patient know that Dr Bary Castilla wanted to talk to her about the results, appointment made for 02-10-14, Whitney Simmons agrees.

## 2014-02-10 ENCOUNTER — Ambulatory Visit (INDEPENDENT_AMBULATORY_CARE_PROVIDER_SITE_OTHER): Payer: Medicare Other | Admitting: General Surgery

## 2014-02-10 ENCOUNTER — Other Ambulatory Visit: Payer: Medicare Other

## 2014-02-10 ENCOUNTER — Encounter: Payer: Self-pay | Admitting: General Surgery

## 2014-02-10 VITALS — BP 136/82 | HR 68 | Resp 14 | Ht 67.0 in | Wt 187.0 lb

## 2014-02-10 DIAGNOSIS — R920 Mammographic microcalcification found on diagnostic imaging of breast: Secondary | ICD-10-CM

## 2014-02-10 DIAGNOSIS — D0590 Unspecified type of carcinoma in situ of unspecified breast: Secondary | ICD-10-CM

## 2014-02-10 DIAGNOSIS — D0512 Intraductal carcinoma in situ of left breast: Secondary | ICD-10-CM

## 2014-02-10 NOTE — Progress Notes (Signed)
Patient ID: Whitney Simmons, female   DOB: [birth year], 80 y.o.   MRN: [id number]  Chief Complaint  Patient presents with  . Other    discussion    HPI Whitney Simmons is a 80 y.o. female. here today for a breast discussion.  Patient here today for follow up post left breast stereo. Steristrip in place and aware it may come off in one week.  Minimal bruising noted.  The patient is aware that a heating pad may be used for comfort as needed.  Aware of pathology.   The patient was accompanied by her husband as well as her minister's wife, Burt Knack.                                     HPI  Past Medical History  Diagnosis Date  . Irregular heart beat   . Thyroid disease     s/p radioactive iodine ablation 30 years ago  . Hypertension   . Hyperlipidemia   . Edema   . IBS (irritable bowel syndrome)     Past Surgical History  Procedure Laterality Date  . Cholecystectomy    . Cataract extraction    . Toe amputation      left 5th toe    Family History  Problem Relation Age of Onset  . Heart disease Mother   . Heart disease Maternal Aunt     Social History History  Substance Use Topics  . Smoking status: Never Smoker   . Smokeless tobacco: Never Used  . Alcohol Use: No    Allergies  Allergen Reactions  . Levaquin [Levofloxacin In D5w] Other (See Comments)    hallucinations  . Sulfa Antibiotics     Hallucinations     Current Outpatient Prescriptions  Medication Sig Dispense Refill  . aspirin 81 MG tablet Take 81 mg by mouth daily.      . furosemide (LASIX) 20 MG tablet Take 1 tablet (20 mg total) by mouth daily.  90 tablet  3  . levothyroxine (SYNTHROID, LEVOTHROID) 100 MCG tablet Take 1 tablet (100 mcg total) by mouth daily.  90 tablet  3  . losartan (COZAAR) 50 MG tablet Take 1 tablet (50 mg total) by mouth daily.  30 tablet  6  . Multiple Vitamin (MULTIVITAMIN) tablet Take 1 tablet by mouth daily.      . nebivolol (BYSTOLIC) 5 MG tablet Take 1 tablet (5 mg total)  by mouth daily.  90 tablet  3   No current facility-administered medications for this visit.    Review of Systems Review of Systems  Constitutional: Negative.   Respiratory: Negative.   Cardiovascular: Negative.     Blood pressure 136/82, pulse 68, resp. rate 14, height 5\' 7"  (1.702 m), weight 187 lb (84.823 kg).  Physical Exam Physical Exam  Constitutional: She is oriented to person, place, and time. She appears well-developed and well-nourished.  Cardiovascular: Normal rate, regular rhythm and normal heart sounds.   No murmur heard. Pulmonary/Chest: Effort normal and breath sounds normal.  Neurological: She is alert and oriented to person, place, and time.  Skin: Skin is warm and dry.  Minimal bruising in the left breast.   Data Reviewed Ultrasound of the left breast shows a suspected biopsy cavity in the 10 o'clock position 4 cm from the nipple.  Assessment    DCIS left breast     Plan    Options for management  reviewed.  Indications for wide excision discussed. Possible role of anti-estrogen therapy covered.  Patient's daughter,  [name] contacted post visit to discuss mother's care plan (she live in [place]).     The patient is scheduled for surgery at [facility] on 02/15/14. She will pre admit at the hospital on 02/13/14 at 8:15 am. Patient is aware of dates, time, and instructions.   Gaspar Cola 02/10/2014, 8:30 AM

## 2014-02-10 NOTE — Patient Instructions (Signed)
The patient is scheduled for surgery at [facility] on 02/15/14. She will pre admit at the hospital on 02/13/14 at 8:15 am. Patient is aware of dates, time, and instructions.

## 2014-02-13 ENCOUNTER — Ambulatory Visit: Payer: Self-pay | Admitting: General Surgery

## 2014-02-13 ENCOUNTER — Other Ambulatory Visit: Payer: Self-pay | Admitting: General Surgery

## 2014-02-13 DIAGNOSIS — I10 Essential (primary) hypertension: Secondary | ICD-10-CM

## 2014-02-13 DIAGNOSIS — Z853 Personal history of malignant neoplasm of breast: Secondary | ICD-10-CM | POA: Insufficient documentation

## 2014-02-13 DIAGNOSIS — D0512 Intraductal carcinoma in situ of left breast: Secondary | ICD-10-CM

## 2014-02-13 DIAGNOSIS — D0510 Intraductal carcinoma in situ of unspecified breast: Secondary | ICD-10-CM | POA: Insufficient documentation

## 2014-02-15 ENCOUNTER — Ambulatory Visit: Payer: Self-pay | Admitting: General Surgery

## 2014-02-15 DIAGNOSIS — C801 Malignant (primary) neoplasm, unspecified: Secondary | ICD-10-CM

## 2014-02-15 DIAGNOSIS — C50219 Malignant neoplasm of upper-inner quadrant of unspecified female breast: Secondary | ICD-10-CM

## 2014-02-15 HISTORY — DX: Malignant (primary) neoplasm, unspecified: C80.1

## 2014-02-15 HISTORY — PX: BREAST SURGERY: SHX581

## 2014-02-16 ENCOUNTER — Encounter: Payer: Self-pay | Admitting: General Surgery

## 2014-02-18 LAB — PATHOLOGY REPORT

## 2014-02-21 ENCOUNTER — Telehealth: Payer: Self-pay | Admitting: General Surgery

## 2014-02-21 ENCOUNTER — Encounter: Payer: Self-pay | Admitting: General Surgery

## 2014-02-21 NOTE — Telephone Encounter (Signed)
Notified no upstaging. All margins clear. F/U tomorrow as scheduled.

## 2014-02-22 ENCOUNTER — Encounter: Payer: Self-pay | Admitting: General Surgery

## 2014-02-22 ENCOUNTER — Other Ambulatory Visit: Payer: Medicare Other

## 2014-02-22 ENCOUNTER — Ambulatory Visit (INDEPENDENT_AMBULATORY_CARE_PROVIDER_SITE_OTHER): Payer: Self-pay | Admitting: General Surgery

## 2014-02-22 VITALS — BP 138/70 | HR 66 | Resp 14 | Ht 67.0 in | Wt 187.0 lb

## 2014-02-22 DIAGNOSIS — D0512 Intraductal carcinoma in situ of left breast: Secondary | ICD-10-CM

## 2014-02-22 DIAGNOSIS — D0590 Unspecified type of carcinoma in situ of unspecified breast: Secondary | ICD-10-CM

## 2014-02-22 NOTE — Progress Notes (Signed)
Patient ID: Whitney Simmons, female   DOB: 23-Jun-1928, 80 y.o.   MRN: [id number]  Chief Complaint  Patient presents with  . Routine Post Op    HPI Whitney Simmons is a 80 y.o. female.  Here today for her postoperative visit, left breast wide excision done 02-15-14. States she is doing well. Occasional pain, but overall doing well.  HPI  Past Medical History  Diagnosis Date  . Irregular heart beat   . Thyroid disease     s/p radioactive iodine ablation 30 years ago  . Hypertension   . Hyperlipidemia   . Edema   . IBS (irritable bowel syndrome)   . Cancer 02-15-14    DCIS left breast    Past Surgical History  Procedure Laterality Date  . Cholecystectomy    . Cataract extraction    . Toe amputation      left 5th toe  . Breast surgery Left 02-15-14    wide excision    Family History  Problem Relation Age of Onset  . Heart disease Mother   . Heart disease Maternal Aunt     Social History History  Substance Use Topics  . Smoking status: Never Smoker   . Smokeless tobacco: Never Used  . Alcohol Use: No    Allergies  Allergen Reactions  . Levaquin [Levofloxacin In D5w] Other (See Comments)    hallucinations  . Sulfa Antibiotics     Hallucinations     Current Outpatient Prescriptions  Medication Sig Dispense Refill  . aspirin 81 MG tablet Take 81 mg by mouth daily.      . furosemide (LASIX) 20 MG tablet Take 1 tablet (20 mg total) by mouth daily.  90 tablet  3  . levothyroxine (SYNTHROID, LEVOTHROID) 100 MCG tablet Take 1 tablet (100 mcg total) by mouth daily.  90 tablet  3  . losartan (COZAAR) 50 MG tablet Take 1 tablet (50 mg total) by mouth daily.  30 tablet  6  . Multiple Vitamin (MULTIVITAMIN) tablet Take 1 tablet by mouth daily.      . nebivolol (BYSTOLIC) 5 MG tablet Take 1 tablet (5 mg total) by mouth daily.  90 tablet  3   No current facility-administered medications for this visit.    Review of Systems Review of Systems  Constitutional: Negative.    Respiratory: Negative.   Cardiovascular: Negative.     Blood pressure 138/70, pulse 66, resp. rate 14, height 5\' 7"  (1.702 m), weight 187 lb (84.823 kg).  Physical Exam Physical Exam  Constitutional: She is oriented to person, place, and time. She appears well-developed and well-nourished.  Neurological: She is alert and oriented to person, place, and time.  Skin: Skin is warm and dry.    Data Reviewed  Pathology from wide excision: 3.2 cm intermediate grade DCIS. Negative margins.  ER 100%, PR 100%.    Ultrasound examination shows a small seroma cavity measuring 2.0 x 2.2 x 3.5 cm at least 1.75 cm below the skin.  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     Plan    The patient is doing well. She is for her reluctant to consider radiation therapy. The advantages including decreased local recurrence was reviewed. She'll consider whether she is willing to me with the radiation oncologist.  Whether or not she does agree to radiation therapy, she would be encouraged to make use of an antiestrogen.  At a minimal we will plan for followup in the next  month.     PCP/Ref: Ronette Deter     Hervey Ard W 02/24/2014, 7:07 PM

## 2014-02-22 NOTE — Patient Instructions (Addendum)
Continue self breast exams. Call office for any new breast issues or concerns. If you decide to talk to the radiation oncologist please call our office so we can arrange an appointment

## 2014-02-23 ENCOUNTER — Telehealth: Payer: Self-pay

## 2014-02-23 NOTE — Telephone Encounter (Signed)
Whitney Simmons called and said that she would like to go ahead and see the Radiologist over at the cancer center. Just let me know who you want her to see so we can get her set up.

## 2014-02-24 NOTE — Telephone Encounter (Signed)
Please arrange for evaluation by Dr.Chrystal.

## 2014-02-27 ENCOUNTER — Ambulatory Visit: Payer: Self-pay | Admitting: Radiation Oncology

## 2014-02-28 ENCOUNTER — Telehealth: Payer: Self-pay

## 2014-02-28 ENCOUNTER — Encounter: Payer: Self-pay | Admitting: Internal Medicine

## 2014-02-28 NOTE — Telephone Encounter (Signed)
Spoke with patient about seeing Dr Baruch Gouty at the cancer center at [facility]. Patient is scheduled to see him on 03/02/14 at 9:30 am. She is aware of date and time.

## 2014-03-02 ENCOUNTER — Ambulatory Visit: Payer: Medicare Other | Admitting: Internal Medicine

## 2014-03-02 ENCOUNTER — Ambulatory Visit: Payer: Self-pay | Admitting: Internal Medicine

## 2014-03-02 ENCOUNTER — Telehealth: Payer: Self-pay | Admitting: *Deleted

## 2014-03-02 ENCOUNTER — Encounter: Payer: Self-pay | Admitting: General Surgery

## 2014-03-02 ENCOUNTER — Ambulatory Visit: Payer: Self-pay | Admitting: Radiation Oncology

## 2014-03-02 MED ORDER — CEFADROXIL 500 MG PO CAPS: 500.0000 mg | ORAL_CAPSULE | Freq: Two times a day (BID) | ORAL | Status: DC

## 2014-03-02 NOTE — Telephone Encounter (Signed)
Mammosite schedule reviewed with the patient Placement            at [facility] will be calling her for more details Aware of ATB and directions reviewed. Aware no showers and to wear her bra while mammosite in place. Pt agrees.

## 2014-03-02 NOTE — Telephone Encounter (Signed)
Pt wants you to call her today about the mammosite, she has some questions and she is also confused about some stuff.

## 2014-03-02 NOTE — Telephone Encounter (Signed)
Placement 03-14-14 at 9:45 Scan 03-17-14 Treat 9-21 to 25 11 days of ATB sent

## 2014-03-09 ENCOUNTER — Telehealth: Payer: Self-pay

## 2014-03-09 ENCOUNTER — Telehealth: Payer: Self-pay | Admitting: *Deleted

## 2014-03-09 NOTE — Telephone Encounter (Signed)
Had some further questions about patient's mammosite placement and also about the pathology report.

## 2014-03-09 NOTE — Telephone Encounter (Signed)
She and daughter wanted to know if the margins were clear and if the mammosite was something Dr. Bary Castilla done for his patients with a lumpectomy. States she will call back if she thinks of other questions.

## 2014-03-09 NOTE — Telephone Encounter (Signed)
She called to let us know that she had decided not to continue with radiation. She is real concerned about taking the antibiotics as well as other things that her and her daughter have prayed and talked about. She states "I just don't feel like it is the right thing to do in my life right now". I told her it was her decision and that I would let Dr Bary Castilla and the [facility] know. She is to come in on the appointment time for discussion with Dr. Bary Castilla. She agrees and is aware not to take her antibiotics.

## 2014-03-14 ENCOUNTER — Encounter: Payer: Self-pay | Admitting: General Surgery

## 2014-03-14 ENCOUNTER — Ambulatory Visit (INDEPENDENT_AMBULATORY_CARE_PROVIDER_SITE_OTHER): Payer: Self-pay | Admitting: General Surgery

## 2014-03-14 VITALS — BP 132/70 | HR 88 | Resp 18 | Ht 67.0 in | Wt 187.0 lb

## 2014-03-14 DIAGNOSIS — D0590 Unspecified type of carcinoma in situ of unspecified breast: Secondary | ICD-10-CM

## 2014-03-14 DIAGNOSIS — D0512 Intraductal carcinoma in situ of left breast: Secondary | ICD-10-CM

## 2014-03-14 NOTE — Progress Notes (Signed)
Patient ID: Whitney Simmons, female   DOB: [birth year], 80 y.o.   MRN: [id number]  Chief Complaint  Patient presents with  . Other    discuss options    HPI Whitney Simmons is a 80 y.o. female here today to discuss options for breast cancer treatment. Originally, plans were for placement of a MammoSite balloon today. The patient called last week to report she is decided against radiation therapy. She had been reluctant to meet with the radiation oncologist, but she did transiently consider this option. When questioned as to why she was declining radiation she had a litany of reasons, none of which really related to the radiation procedure itself. She was concerned that the antibiotics used to prevent balloon infection would disagree with her system. She is not convinced that it would be of benefit to her.  We reviewed the data that suggests a significant reduction from 30% in breast recurrence to 6% in breast recurrence with the use of radiation therapy.  HPI  Past Medical History  Diagnosis Date  . Irregular heart beat   . Thyroid disease     s/p radioactive iodine ablation 30 years ago  . Hypertension   . Hyperlipidemia   . Edema   . IBS (irritable bowel syndrome)   . Cancer 02-15-14    DCIS left breast, 3.2 cm intermediate grade, ER/PR: 100%    Past Surgical History  Procedure Laterality Date  . Cholecystectomy    . Cataract extraction    . Toe amputation      left 5th toe  . Breast surgery Left 02-15-14    wide excision    Family History  Problem Relation Age of Onset  . Heart disease Mother   . Heart disease Maternal Aunt     Social History History  Substance Use Topics  . Smoking status: Never Smoker   . Smokeless tobacco: Never Used  . Alcohol Use: No    Allergies  Allergen Reactions  . Levaquin [Levofloxacin In D5w] Other (See Comments)    hallucinations  . Sulfa Antibiotics     Hallucinations     Current Outpatient Prescriptions  Medication Sig Dispense  Refill  . aspirin 81 MG tablet Take 81 mg by mouth daily.      . furosemide (LASIX) 20 MG tablet Take 1 tablet (20 mg total) by mouth daily.  90 tablet  3  . levothyroxine (SYNTHROID, LEVOTHROID) 100 MCG tablet Take 1 tablet (100 mcg total) by mouth daily.  90 tablet  3  . losartan (COZAAR) 50 MG tablet Take 1 tablet (50 mg total) by mouth daily.  30 tablet  6  . Multiple Vitamin (MULTIVITAMIN) tablet Take 1 tablet by mouth daily.      . nebivolol (BYSTOLIC) 5 MG tablet Take 1 tablet (5 mg total) by mouth daily.  90 tablet  3   No current facility-administered medications for this visit.    Review of Systems Review of Systems  Constitutional: Negative.   Respiratory: Negative.   Cardiovascular: Negative.     Blood pressure 132/70, pulse 88, resp. rate 18, height 5\' 7"  (1.702 m), weight 187 lb (84.823 kg).  Physical Exam Physical Exam  Constitutional: She is oriented to person, place, and time. She appears well-developed and well-nourished.  Neck: Neck supple.  Pulmonary/Chest:  Right breast lumpectomy site healing well.  Lymphadenopathy:    She has no cervical adenopathy.  Neurological: She is alert and oriented to person, place, and time.  Skin: Skin is warm  and dry.    Data Reviewed Pathology.  Assessment    Intermediate grade DCIS, hormone sensitive.    Plan    The patient feels that because of her age she is not likely benefit from radiation therapy. I've asked her to consider the use of antiestrogen therapy, as this would give her a least a 50% reduction of in breast and contralateral breast recurrence. Tamoxifen would be the drug of choice, and the known side effects including 1% risk of DVT, 1% risk of endometrial cancer and the potential for vasomotor symptoms was reviewed. If this was not tolerated, Evista could be substituted at a significantly greater cost.  New data has also suggested aromatase inhibitor she may have a role to play, but this would be second  line treatment based on the lack of long-term studies.  Considering the size of the tumor I strongly encouraged her to consider antiestrogen therapy in light of her decision not to make use of radiation.  Even know she's 86 she is an excellent functional status and I anticipate that she has a life expectancy of approximately 10 years putting her at risk for recurrent disease.  She reports that she will consider her options and notify the office of she's going to make use of antiestrogen therapy.    PCP: Sonnie Alamo 03/15/2014, 6:59 AM

## 2014-03-15 ENCOUNTER — Encounter: Payer: Self-pay | Admitting: General Surgery

## 2014-03-23 ENCOUNTER — Ambulatory Visit: Payer: Medicare Other | Admitting: Podiatrist

## 2014-03-23 ENCOUNTER — Ambulatory Visit: Payer: Medicare Other | Admitting: Podiatry

## 2014-03-25 ENCOUNTER — Ambulatory Visit (INDEPENDENT_AMBULATORY_CARE_PROVIDER_SITE_OTHER): Payer: Medicare Other

## 2014-03-25 DIAGNOSIS — Z23 Encounter for immunization: Secondary | ICD-10-CM

## 2014-03-28 ENCOUNTER — Ambulatory Visit (INDEPENDENT_AMBULATORY_CARE_PROVIDER_SITE_OTHER): Payer: Medicare Other | Admitting: Podiatry

## 2014-03-28 DIAGNOSIS — M79676 Pain in unspecified toe(s): Secondary | ICD-10-CM

## 2014-03-28 DIAGNOSIS — M79609 Pain in unspecified limb: Secondary | ICD-10-CM

## 2014-03-28 DIAGNOSIS — B351 Tinea unguium: Secondary | ICD-10-CM

## 2014-03-28 NOTE — Progress Notes (Signed)
Patient ID: Whitney Simmons, female   DOB: 1928-03-16, 80 y.o.   MRN: [id number]  Subjective: Whitney Simmons returns the office they for followup evaluation of painful elongated nails. States that her nails are painful particularly with shoe gear when they are elongated. She denies any acute changes to her feet since last appointment. No other complaints at this time.  Objective: AAO x3, NAD DP/PT pulses palpable b/l. CRT < 3 sec Protective sensation intact with Simms Weinstein monofilament. Previous left fifth digit amputation. Nails hypertrophic, dystrophic, elongated, yellow discoloration x9. No surrounding erythema or drainage. No open lesions or pre-ulcerative lesions. No calf pain, swelling, warmth.  Assessment: 80 year old female with symptomatic onychomycosis.  Plan: -Various treatment options were discussed including alternatives, risks, complications. -Nails sharply debrided A35 without complications. -Discussed the importance of daily foot inspection. -Followup in 3 months or sooner if any problems are to arise or any change in symptoms. In the meantime call any questions, concerns.

## 2014-03-30 ENCOUNTER — Ambulatory Visit: Payer: Self-pay | Admitting: Radiation Oncology

## 2014-04-14 ENCOUNTER — Encounter: Payer: Self-pay | Admitting: Internal Medicine

## 2014-04-28 ENCOUNTER — Telehealth: Payer: Self-pay

## 2014-04-28 ENCOUNTER — Encounter: Payer: Self-pay | Admitting: Internal Medicine

## 2014-04-28 NOTE — Telephone Encounter (Signed)
Sent mychart

## 2014-04-28 NOTE — Telephone Encounter (Signed)
I called the pt to schedule at the Sat clinic at [facility], however, the patient refused. She stated she would not travel "that far" to [place].

## 2014-04-28 NOTE — Telephone Encounter (Signed)
OK. We will need to see her next week then, or she can go to urgent care here locally.

## 2014-05-12 ENCOUNTER — Telehealth: Payer: Self-pay | Admitting: Internal Medicine

## 2014-05-12 ENCOUNTER — Encounter: Payer: Self-pay | Admitting: Internal Medicine

## 2014-05-12 ENCOUNTER — Telehealth: Payer: Self-pay | Admitting: *Deleted

## 2014-05-12 ENCOUNTER — Ambulatory Visit (INDEPENDENT_AMBULATORY_CARE_PROVIDER_SITE_OTHER): Payer: Medicare Other | Admitting: Internal Medicine

## 2014-05-12 VITALS — BP 134/82 | HR 70 | Temp 97.6°F | Ht 65.0 in | Wt 190.0 lb

## 2014-05-12 DIAGNOSIS — H409 Unspecified glaucoma: Secondary | ICD-10-CM

## 2014-05-12 DIAGNOSIS — F419 Anxiety disorder, unspecified: Secondary | ICD-10-CM

## 2014-05-12 DIAGNOSIS — I10 Essential (primary) hypertension: Secondary | ICD-10-CM

## 2014-05-12 DIAGNOSIS — N183 Chronic kidney disease, stage 3 unspecified: Secondary | ICD-10-CM

## 2014-05-12 DIAGNOSIS — D0512 Intraductal carcinoma in situ of left breast: Secondary | ICD-10-CM

## 2014-05-12 DIAGNOSIS — M25512 Pain in left shoulder: Secondary | ICD-10-CM

## 2014-05-12 DIAGNOSIS — M25511 Pain in right shoulder: Secondary | ICD-10-CM

## 2014-05-12 LAB — MICROALBUMIN / CREATININE URINE RATIO
Creatinine,U: 88.5 mg/dL
Microalb Creat Ratio: 0.3 mg/g (ref 0.0–30.0)
Microalb, Ur: 0.3 mg/dL (ref 0.0–1.9)

## 2014-05-12 LAB — COMPREHENSIVE METABOLIC PANEL
ALT: 15 U/L (ref 0–35)
AST: 16 U/L (ref 0–37)
Albumin: 3 g/dL — ABNORMAL LOW (ref 3.5–5.2)
Alkaline Phosphatase: 76 U/L (ref 39–117)
BUN: 14 mg/dL (ref 6–23)
CO2: 27 mEq/L (ref 19–32)
Calcium: 9 mg/dL (ref 8.4–10.5)
Chloride: 105 mEq/L (ref 96–112)
Creatinine, Ser: 1 mg/dL (ref 0.4–1.2)
GFR: 55.44 mL/min — ABNORMAL LOW (ref >60.00)
Glucose, Bld: 121 mg/dL — ABNORMAL HIGH (ref 70–99)
Potassium: 4.6 mEq/L (ref 3.5–5.1)
Sodium: 139 mEq/L (ref 135–145)
Total Bilirubin: 0.8 mg/dL (ref 0.2–1.2)
Total Protein: 6.4 g/dL (ref 6.0–8.3)

## 2014-05-12 LAB — CBC WITH DIFFERENTIAL/PLATELET
Basophils Absolute: 0.1 10*3/uL (ref 0.0–0.1)
Basophils Relative: 1.1 % (ref 0.0–3.0)
Eosinophils Absolute: 0.1 10*3/uL (ref 0.0–0.7)
Eosinophils Relative: 1.5 % (ref 0.0–5.0)
HCT: 38.4 % (ref 36.0–46.0)
Hemoglobin: 12.4 g/dL (ref 12.0–15.0)
Lymphocytes Relative: 19 % (ref 12.0–46.0)
Lymphs Abs: 1.6 10*3/uL (ref 0.7–4.0)
MCHC: 32.4 g/dL (ref 30.0–36.0)
MCV: 90.3 fl (ref 78.0–100.0)
Monocytes Absolute: 0.7 10*3/uL (ref 0.1–1.0)
Monocytes Relative: 8.7 % (ref 3.0–12.0)
Neutro Abs: 5.7 10*3/uL (ref 1.4–7.7)
Neutrophils Relative %: 69.7 % (ref 43.0–77.0)
Platelets: 214 10*3/uL (ref 150.0–400.0)
RBC: 4.25 Mil/uL (ref 3.87–5.11)
RDW: 14.2 % (ref 11.5–15.5)
WBC: 8.2 10*3/uL (ref 4.0–10.5)

## 2014-05-12 NOTE — Telephone Encounter (Signed)
Please see below.

## 2014-05-12 NOTE — Telephone Encounter (Signed)
Ms. Mclaurin said her husband's doctor Gigi Gin - dermatologist) is in network so Dr. Gilford Rile should be able to set up an appt for him. Please call the pt if you have any questions or concerns.  Pt ph# [phone] Thank you.

## 2014-05-12 NOTE — Progress Notes (Signed)
Pre visit review using our clinic review tool, if applicable. No additional management support is needed unless otherwise documented below in the visit note. 

## 2014-05-12 NOTE — Progress Notes (Signed)
Subjective:    Patient ID: Whitney Simmons, female    DOB: 1928-04-24, 80 y.o.   MRN: [id number]  HPI 80YO female presents for follow up. Tearful today describing how she has been treated recently. Frustrated with medical care. Feels like she gets less personal attention.  DCIS - decided not to take XRT or anti-estrogen therapy for this. She understands risk of recurrence and risk of interventions.  Glaucoma - started on Timolol for left eye glaucoma by Dr. Thomasene Ripple. Pressure had improved on last check  Increased right ear pressure noted by Audiologist. Scheduled to see ENT.  Right shoulder pain - Concerned about recent right shoulder pain. Going on for a few months. Initially started when she bumped her right elbow on a table. Now with pain with movement of right shoulder. Feels that right arm in general is weaker, with weaker grip strength. Has not taken anything for this.    Review of Systems  Constitutional: Negative for fever, chills, appetite change, fatigue and unexpected weight change.  Eyes: Negative for visual disturbance.  Respiratory: Negative for shortness of breath.   Cardiovascular: Negative for chest pain and leg swelling.  Gastrointestinal: Negative for nausea, vomiting, abdominal pain, diarrhea and constipation.  Musculoskeletal: Positive for myalgias and arthralgias.  Skin: Negative for color change and rash.  Neurological: Positive for weakness.  Hematological: Negative for adenopathy. Does not bruise/bleed easily.  Psychiatric/Behavioral: Negative for sleep disturbance and dysphoric mood. The patient is nervous/anxious.        Objective:    BP 134/82 mmHg  Pulse 70  Temp(Src) 97.6 F (36.4 C) (Oral)  Ht 5\' 5"  (1.651 m)  Wt 190 lb (86.183 kg)  BMI 31.62 kg/m2  SpO2 98% Physical Exam  Constitutional: She is oriented to person, place, and time. She appears well-developed and well-nourished. No distress.  HENT:  Head: Normocephalic and atraumatic.    Right Ear: External ear normal.  Left Ear: External ear normal.  Nose: Nose normal.  Mouth/Throat: Oropharynx is clear and moist. No oropharyngeal exudate.  Eyes: Conjunctivae are normal. Pupils are equal, round, and reactive to light. Right eye exhibits no discharge. Left eye exhibits no discharge. No scleral icterus.  Neck: Normal range of motion. Neck supple. No tracheal deviation present. No thyromegaly present.  Cardiovascular: Normal rate, regular rhythm, normal heart sounds and intact distal pulses.  Exam reveals no gallop and no friction rub.   No murmur heard. Pulmonary/Chest: Effort normal and breath sounds normal. No accessory muscle usage. No tachypnea. No respiratory distress. She has no decreased breath sounds. She has no wheezes. She has no rhonchi. She has no rales. She exhibits no tenderness.  Musculoskeletal: She exhibits no edema.       Right shoulder: She exhibits decreased range of motion, tenderness and pain. She exhibits no bony tenderness and normal strength.  Lymphadenopathy:    She has no cervical adenopathy.  Neurological: She is alert and oriented to person, place, and time. No cranial nerve deficit. She exhibits normal muscle tone. Coordination normal.  Skin: Skin is warm and dry. No rash noted. She is not diaphoretic. No erythema. No pallor.  Psychiatric: Her speech is normal and behavior is normal. Judgment and thought content normal. Her mood appears anxious.          Assessment & Plan:   Problem List Items Addressed This Visit      Unprioritized   Anxiety    Symptoms recently worsened with new diagnosis breast cancer. Offered support today. Will continue  to monitor.    Chronic kidney disease, stage III (moderate)    Will check renal function with labs today.    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 Bary Castilla.  Reviewed risks and benefits of XRT and anti-estrogen therapy.    Relevant Orders      CBC with Differential   Glaucoma   Hypertension    BP Readings from Last 3 Encounters:  05/12/14 134/82  03/14/14 132/70  02/22/14 138/70   BP well controlled on Losartan. Will continue. Renal function with labs today.    Right shoulder pain    Right shoulder pain after injury to right elbow. Will set up orthopedic evaluation for imaging and exam of right shoulder.        Return in about 4 weeks (around 06/09/2014).

## 2014-05-12 NOTE — Addendum Note (Signed)
Addended by: Karlene Einstein D on: 05/12/2014 02:16 PM   Modules accepted: Orders

## 2014-05-12 NOTE — Assessment & Plan Note (Signed)
Right shoulder pain after injury to right elbow. Will set up orthopedic evaluation for imaging and exam of right shoulder.

## 2014-05-12 NOTE — Assessment & Plan Note (Signed)
BP Readings from Last 3 Encounters:  05/12/14 134/82  03/14/14 132/70  02/22/14 138/70   BP well controlled on Losartan. Will continue. Renal function with labs today.

## 2014-05-12 NOTE — Telephone Encounter (Signed)
Pt came in for re-collect, collected a blue top as well for a just in case

## 2014-05-12 NOTE — Patient Instructions (Signed)
Labs today.  Follow up in 4 weeks. 

## 2014-05-12 NOTE — Assessment & Plan Note (Signed)
Will check renal function with labs today. 

## 2014-05-12 NOTE — Telephone Encounter (Signed)
[facility] called saying that they need a re-collect on pt lab, they have run it 3 time and each time it gives a different result, called pt she said she will come back today

## 2014-05-12 NOTE — Assessment & Plan Note (Signed)
Symptoms recently worsened with new diagnosis breast cancer. Offered support today. Will continue to monitor.

## 2014-05-12 NOTE — Assessment & Plan Note (Signed)
Reviewed notes from Dr. Bary Castilla. Reviewed risks and benefits of XRT and anti-estrogen therapy.

## 2014-05-15 ENCOUNTER — Ambulatory Visit (INDEPENDENT_AMBULATORY_CARE_PROVIDER_SITE_OTHER): Payer: Medicare Other | Admitting: General Surgery

## 2014-05-15 ENCOUNTER — Encounter: Payer: Self-pay | Admitting: General Surgery

## 2014-05-15 VITALS — BP 130/72 | HR 74 | Resp 14 | Ht 67.0 in | Wt 188.0 lb

## 2014-05-15 DIAGNOSIS — D0512 Intraductal carcinoma in situ of left breast: Secondary | ICD-10-CM

## 2014-05-15 NOTE — Progress Notes (Signed)
Patient ID: Whitney Simmons, female   DOB: March 18, 1928, 80 y.o.   MRN: [id number]  Chief Complaint  Patient presents with  . Follow-up    breast cancer    HPI Whitney Simmons is a 80 y.o. female here today to follow-up her prior excision of DCIS. She states she is doing well. She staes he has been having night sweats for some time.  She was unable to state with any clarity how long the supine going on. She is otherwise asymptomatic.  The patient declined post excision radiation therapy and antiestrogen therapy. HPI  Past Medical History  Diagnosis Date  . Irregular heart beat   . Thyroid disease     s/p radioactive iodine ablation 30 years ago  . Hypertension   . Hyperlipidemia   . Edema   . IBS (irritable bowel syndrome)   . Cancer 02-15-14    DCIS left breast, 3.2 cm intermediate grade, ER/PR: 100%    Past Surgical History  Procedure Laterality Date  . Cholecystectomy    . Cataract extraction    . Toe amputation      left 5th toe  . Breast surgery Left 02-15-14    wide excision    Family History  Problem Relation Age of Onset  . Heart disease Mother   . Heart disease Maternal Aunt     Social History History  Substance Use Topics  . Smoking status: Never Smoker   . Smokeless tobacco: Never Used  . Alcohol Use: No    Allergies  Allergen Reactions  . Levaquin [Levofloxacin In D5w] Other (See Comments)    hallucinations  . Sulfa Antibiotics     Hallucinations     Current Outpatient Prescriptions  Medication Sig Dispense Refill  . aspirin 81 MG tablet Take 81 mg by mouth daily.    . furosemide (LASIX) 20 MG tablet Take 1 tablet (20 mg total) by mouth daily. 90 tablet 3  . levothyroxine (SYNTHROID, LEVOTHROID) 100 MCG tablet Take 1 tablet (100 mcg total) by mouth daily. 90 tablet 3  . losartan (COZAAR) 50 MG tablet Take 1 tablet (50 mg total) by mouth daily. 30 tablet 6  . Multiple Vitamin (MULTIVITAMIN) tablet Take 1 tablet by mouth daily.    . nebivolol  (BYSTOLIC) 5 MG tablet Take 1 tablet (5 mg total) by mouth daily. 90 tablet 3   No current facility-administered medications for this visit.    Review of Systems Review of Systems  Constitutional: Negative.   Respiratory: Negative.   Cardiovascular: Negative.     Blood pressure 130/72, pulse 74, resp. rate 14, height 5\' 7"  (1.702 m), weight 188 lb (85.276 kg).  Physical Exam Physical Exam  Constitutional: She is oriented to person, place, and time. She appears well-nourished.  Eyes: Conjunctivae are normal. No scleral icterus.  Neck: Neck supple.  Cardiovascular: Normal rate, regular rhythm and normal heart sounds.   Pulmonary/Chest: Effort normal and breath sounds normal. Right breast exhibits no inverted nipple, no mass, no nipple discharge, no skin change and no tenderness. Left breast exhibits no inverted nipple, no mass, no nipple discharge, no skin change and no tenderness.  Left breast well healed incision and thickening in the upper left quadrant.   Lymphadenopathy:    She has no cervical adenopathy.    She has no axillary adenopathy.  Neurological: She is alert and oriented to person, place, and time.  Skin: Skin is warm and dry.    Data Reviewed DCIS, ER/PR positive.  Assessment  DCIS.  Night sweats of unknown etiology.     Plan    The patient once again reiterated that she does not desire postoperative radiation therapy or hormonal therapy.  Patient to return in February 2016 for a left breast mammogram .       Robert Bellow 05/17/2014, 8:31 AM

## 2014-05-15 NOTE — Patient Instructions (Signed)
Patient to return in February 2016 for a left breast mammogram .

## 2014-06-21 ENCOUNTER — Ambulatory Visit (INDEPENDENT_AMBULATORY_CARE_PROVIDER_SITE_OTHER): Payer: Medicare Other | Admitting: Podiatrist

## 2014-06-21 DIAGNOSIS — M79673 Pain in unspecified foot: Secondary | ICD-10-CM

## 2014-06-21 DIAGNOSIS — B351 Tinea unguium: Secondary | ICD-10-CM

## 2014-06-21 MED ORDER — CEPHALEXIN 500 MG PO CAPS: 500.0000 mg | ORAL_CAPSULE | Freq: Two times a day (BID) | ORAL | Status: DC

## 2014-06-21 NOTE — Progress Notes (Signed)
HPI: Patient presents today for follow up of foot and nail care. Denies any new complaints today. Relates pain on the medial and lateral side of the right great toe where she has an ingrowing toenail deformity  Objective: Patients chart is reviewed. Neurovascular status unchanged. Pedal pulses palpable at 1/4 dp and pt bilateral. Patients nails are thickened, discolored, distrophic, friable and brittle with yellow-brown discoloration. Patient subjectively relates they are painful with shoes and with ambulation of bilateral feet. Decreased fat pad noted plantar feet bilateral. 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 Right hallux nail is incurvated and uncomfortable - slight redness to the toe is also noted.   Assessment: Symptomatic onychomycosis x 9   Plan: Discussed treatment options and alternatives. The symptomatic toenails were debrided through manual an mechanical means without complication. Return appointment recommended at routine intervals of 3 month. Recommended to call if any problems or concerns arise

## 2014-06-27 ENCOUNTER — Encounter: Payer: Self-pay | Admitting: Internal Medicine

## 2014-06-27 ENCOUNTER — Ambulatory Visit (INDEPENDENT_AMBULATORY_CARE_PROVIDER_SITE_OTHER): Payer: Medicare Other | Admitting: Internal Medicine

## 2014-06-27 ENCOUNTER — Ambulatory Visit: Payer: Medicare Other | Admitting: Podiatry

## 2014-06-27 VITALS — BP 128/73 | HR 82 | Temp 98.3°F | Ht 65.0 in | Wt 184.2 lb

## 2014-06-27 DIAGNOSIS — D0512 Intraductal carcinoma in situ of left breast: Secondary | ICD-10-CM

## 2014-06-27 DIAGNOSIS — I10 Essential (primary) hypertension: Secondary | ICD-10-CM

## 2014-06-27 DIAGNOSIS — N183 Chronic kidney disease, stage 3 unspecified: Secondary | ICD-10-CM

## 2014-06-27 DIAGNOSIS — M25511 Pain in right shoulder: Secondary | ICD-10-CM

## 2014-06-27 LAB — COMPREHENSIVE METABOLIC PANEL
ALT: 13 U/L (ref 0–35)
AST: 15 U/L (ref 0–37)
Albumin: 3.4 g/dL — ABNORMAL LOW (ref 3.5–5.2)
Alkaline Phosphatase: 80 U/L (ref 39–117)
BUN: 14 mg/dL (ref 6–23)
CO2: 22 mEq/L (ref 19–32)
Calcium: 8.7 mg/dL (ref 8.4–10.5)
Chloride: 103 mEq/L (ref 96–112)
Creatinine, Ser: 1.1 mg/dL (ref 0.4–1.2)
GFR: 51.85 mL/min — ABNORMAL LOW (ref >60.00)
Glucose, Bld: 118 mg/dL — ABNORMAL HIGH (ref 70–99)
Potassium: 4.4 mEq/L (ref 3.5–5.1)
Sodium: 135 mEq/L (ref 135–145)
Total Bilirubin: 1.1 mg/dL (ref 0.2–1.2)
Total Protein: 7 g/dL (ref 6.0–8.3)

## 2014-06-27 MED ORDER — LOSARTAN POTASSIUM 50 MG PO TABS: 50.0000 mg | ORAL_TABLET | Freq: Every day | ORAL | Status: DC

## 2014-06-27 NOTE — Progress Notes (Signed)
Pre visit review using our clinic review tool, if applicable. No additional management support is needed unless otherwise documented below in the visit note. 

## 2014-06-27 NOTE — Assessment & Plan Note (Signed)
BP Readings from Last 3 Encounters:  06/27/14 128/73  05/15/14 130/72  05/12/14 134/82   BP well controlled. Continue current medication. Renal function with labs today.

## 2014-06-27 NOTE — Progress Notes (Signed)
Subjective:    Patient ID: Whitney Simmons, female    DOB: 11-Sep-1927, 80 y.o.   MRN: [id number]  HPI  80YO female presents for follow up.  Last seen 04/2014 with right shoulder pain. She was referred to and seen by Dr. Mack Guise on 12/4 and assessed as having possible right rotator cuff tear. MRI was ordered, but she cancelled because symptoms were improving. No current symptoms of pain or decreased ROM.  Feeling "terrible" with runny nose and cough over last couple of days. Feels tired. No fever, chills. Not taking anything for this. Husband sick with similar symptoms.   HTN - Compliant with medications. No chest pain, headache, palpitations.  Paronychia - recently diagnosed with possible skin infection of great right toe. Keflex was ordered but she never started this. Redness and pain have improved.  DCIS - Notes some numbness at site of lumpectomy. No other new changes.  Past medical, surgical, family and social history per today's encounter.  Review of Systems  Constitutional: Negative for fever, chills, appetite change, fatigue and unexpected weight change.  HENT: Positive for congestion, postnasal drip, rhinorrhea and sore throat. Negative for sinus pressure, sneezing, trouble swallowing and voice change.   Eyes: Negative for visual disturbance.  Respiratory: Positive for cough. Negative for shortness of breath.   Cardiovascular: Negative for chest pain and leg swelling.  Gastrointestinal: Negative for abdominal pain.  Musculoskeletal: Negative for myalgias and arthralgias.  Skin: Negative for color change and rash.  Hematological: Negative for adenopathy. Does not bruise/bleed easily.  Psychiatric/Behavioral: Negative for dysphoric mood. The patient is not nervous/anxious.        Objective:    BP 128/73 mmHg  Pulse 82  Temp(Src) 98.3 F (36.8 C) (Oral)  Ht 5\' 5"  (1.651 m)  Wt 184 lb 4 oz (83.575 kg)  BMI 30.66 kg/m2  SpO2 95% Physical Exam  Constitutional: She is  oriented to person, place, and time. She appears well-developed and well-nourished. No distress.  HENT:  Head: Normocephalic and atraumatic.  Right Ear: External ear normal.  Left Ear: External ear normal.  Nose: Nose normal.  Mouth/Throat: Oropharynx is clear and moist. No oropharyngeal exudate.  Eyes: Conjunctivae are normal. Pupils are equal, round, and reactive to light. Right eye exhibits no discharge. Left eye exhibits no discharge. No scleral icterus.  Neck: Normal range of motion. Neck supple. No tracheal deviation present. No thyromegaly present.  Cardiovascular: Normal rate, regular rhythm, normal heart sounds and intact distal pulses.  Exam reveals no gallop and no friction rub.   No murmur heard. Pulmonary/Chest: Effort normal and breath sounds normal. No accessory muscle usage. No tachypnea. No respiratory distress. She has no decreased breath sounds. She has no wheezes. She has no rhonchi. She has no rales. She exhibits no tenderness.  Musculoskeletal: Normal range of motion. She exhibits no edema or tenderness.  Lymphadenopathy:    She has no cervical adenopathy.  Neurological: She is alert and oriented to person, place, and time. No cranial nerve deficit. She exhibits normal muscle tone. Coordination normal.  Skin: Skin is warm and dry. No rash noted. She is not diaphoretic. No erythema. No pallor.     Psychiatric: She has a normal mood and affect. Her behavior is normal. Judgment and thought content normal.          Assessment & Plan:   Problem List Items Addressed This Visit      Unprioritized   Chronic kidney disease, stage III (moderate)    Recheck renal  function with labs today.    DCIS (ductal carcinoma in situ) of breast    We discussed that some numbness at surgical site can be expected. Continue follow up with Dr. Bary Castilla as scheduled.    Hypertension    BP Readings from Last 3 Encounters:  06/27/14 128/73  05/15/14 130/72  05/12/14 134/82   BP well  controlled. Continue current medication. Renal function with labs today.    Relevant Medications      losartan (COZAAR) tablet   Other Relevant Orders      Comprehensive metabolic panel   Right shoulder pain - Primary    Right shoulder pain has resolved. Pt canceled MRI. Reviewed notes from Dr. Mack Guise. Follow up prn.        Return in about 4 weeks (around 07/25/2014) for Recheck.

## 2014-06-27 NOTE — Assessment & Plan Note (Signed)
We discussed that some numbness at surgical site can be expected. Continue follow up with Dr. Bary Castilla as scheduled.

## 2014-06-27 NOTE — Patient Instructions (Signed)
Start Claritin 10 mg daily to help control nasal drainage.  Call immediately if fever, chills, worsening cough, shortness of breath.  Follow up 4 weeks or sooner as needed.

## 2014-06-27 NOTE — Assessment & Plan Note (Signed)
Recheck renal function with labs today.

## 2014-06-27 NOTE — Assessment & Plan Note (Signed)
Right shoulder pain has resolved. Pt canceled MRI. Reviewed notes from Dr. Mack Guise. Follow up prn.

## 2014-07-04 ENCOUNTER — Telehealth: Payer: Self-pay | Admitting: Internal Medicine

## 2014-07-04 NOTE — Telephone Encounter (Signed)
Patient stated she has worsening symptoms started having chills and fever, tried

## 2014-07-05 ENCOUNTER — Ambulatory Visit (INDEPENDENT_AMBULATORY_CARE_PROVIDER_SITE_OTHER): Payer: PPO | Admitting: Nurse Practitioner

## 2014-07-05 ENCOUNTER — Encounter: Payer: Self-pay | Admitting: Nurse Practitioner

## 2014-07-05 VITALS — BP 120/60 | HR 84 | Temp 98.4°F | Resp 14 | Ht 65.0 in | Wt 178.1 lb

## 2014-07-05 DIAGNOSIS — R059 Cough, unspecified: Secondary | ICD-10-CM

## 2014-07-05 DIAGNOSIS — H109 Unspecified conjunctivitis: Secondary | ICD-10-CM

## 2014-07-05 DIAGNOSIS — R05 Cough: Secondary | ICD-10-CM

## 2014-07-05 MED ORDER — BENZONATATE 100 MG PO CAPS: 100.0000 mg | ORAL_CAPSULE | Freq: Two times a day (BID) | ORAL | Status: DC | PRN

## 2014-07-05 MED ORDER — AZITHROMYCIN 250 MG PO TABS: 250.0000 mg | ORAL_TABLET | Freq: Every day | ORAL | Status: DC

## 2014-07-05 NOTE — Progress Notes (Signed)
Subjective:    Patient ID: Whitney Simmons, female    DOB: 1928-01-04, 81 y.o.   MRN: [id number]  HPI  Whitney Simmons is a 81 yo female with a CC of cough x 18 days and ear pain right side x 4 days.   Cough is productive with white phlegm. Also, reports sinus headache and pressure of frontal sinuses, eye is red on right and for the past few days she reports having crusting and drainage. She reports trying cough drops only and without relief. Severity is minor and bothersome to patient.   Review of Systems  Constitutional: Negative for fever, chills, diaphoresis and fatigue.  HENT: Positive for congestion, postnasal drip and sinus pressure. Negative for ear discharge, ear pain, facial swelling, rhinorrhea, sneezing, sore throat, tinnitus, trouble swallowing and voice change.   Eyes: Positive for discharge and redness. Negative for pain, itching and visual disturbance.  Respiratory: Positive for cough. Negative for chest tightness, shortness of breath and wheezing.   Gastrointestinal: Negative for nausea, vomiting and diarrhea.  Skin: Negative for rash.  Neurological: Positive for headaches. Negative for dizziness.   Past Medical History  Diagnosis Date  . Irregular heart beat   . Thyroid disease     s/p radioactive iodine ablation 30 years ago  . Hypertension   . Hyperlipidemia   . Edema   . IBS (irritable bowel syndrome)   . Cancer 02-15-14    DCIS left breast, 3.2 cm intermediate grade, ER/PR: 100%    History   Social History  . Marital Status: Married    Spouse Name: N/A    Number of Children: N/A  . Years of Education: N/A   Occupational History  . Not on file.   Social History Main Topics  . Smoking status: Never Smoker   . Smokeless tobacco: Never Used  . Alcohol Use: No  . Drug Use: No  . Sexual Activity: Not on file   Other Topics Concern  . Not on file   Social History Narrative   Lives in [place] at [place]. From [place]. Has daughter.      Work-  Retired Pharmacist, hospital    Past Surgical History  Procedure Laterality Date  . Cholecystectomy    . Cataract extraction    . Toe amputation      left 5th toe  . Breast surgery Left 02-15-14    wide excision    Family History  Problem Relation Age of Onset  . Heart disease Mother   . Heart disease Maternal Aunt     Allergies  Allergen Reactions  . Levaquin [Levofloxacin In D5w] Other (See Comments)    hallucinations  . Sulfa Antibiotics     Hallucinations     Current Outpatient Prescriptions on File Prior to Visit  Medication Sig Dispense Refill  . aspirin 81 MG tablet Take 81 mg by mouth daily.    . furosemide (LASIX) 20 MG tablet Take 1 tablet (20 mg total) by mouth daily. 90 tablet 3  . levothyroxine (SYNTHROID, LEVOTHROID) 100 MCG tablet Take 1 tablet (100 mcg total) by mouth daily. 90 tablet 3  . losartan (COZAAR) 50 MG tablet Take 1 tablet (50 mg total) by mouth daily. 90 tablet 3  . Multiple Vitamin (MULTIVITAMIN) tablet Take 1 tablet by mouth daily.    . nebivolol (BYSTOLIC) 5 MG tablet Take 1 tablet (5 mg total) by mouth daily. 90 tablet 3   No current facility-administered medications on file prior to visit.  Objective:   Physical Exam  Constitutional: She is oriented to person, place, and time. She appears well-developed and well-nourished. No distress.  Eyes: Pupils are equal, round, and reactive to light. Right eye exhibits discharge. Left eye exhibits no discharge and no exudate. Right conjunctiva is injected. Right conjunctiva has no hemorrhage. Left conjunctiva is not injected. Left conjunctiva has no hemorrhage. Right eye exhibits normal extraocular motion and no nystagmus. Left eye exhibits normal extraocular motion and no nystagmus.  Neck: Normal range of motion. Neck supple. No tracheal deviation present.  Cardiovascular: Normal rate and regular rhythm.   Pulmonary/Chest: Effort normal. No respiratory distress. She has no wheezes. She has no rales. She  exhibits no tenderness.  Mild rhonchi noted on exam bilaterally  Lymphadenopathy:    She has no cervical adenopathy.  Neurological: She is alert and oriented to person, place, and time.  Skin: Skin is warm and dry. No rash noted. She is not diaphoretic.  Psychiatric: She has a normal mood and affect. Her behavior is normal. Judgment and thought content normal.   BP 120/60 mmHg  Pulse 84  Temp(Src) 98.4 F (36.9 C) (Oral)  Resp 14  Ht 5\' 5"  (1.651 m)  Wt 178 lb 1.9 oz (80.795 kg)  BMI 29.64 kg/m2  SpO2 94%    Assessment & Plan:

## 2014-07-05 NOTE — Patient Instructions (Signed)
Please take all antibiotics as instructed. If you could take a probiotic with this antibiotic it will help keep the "good bacteria" in your system.   Tessalon perles are for your cough.   Please get your Chest X-ray at [facility] at your earliest availability.   We will Follow up in 2 weeks.

## 2014-07-05 NOTE — Progress Notes (Signed)
Pre visit review using our clinic review tool, if applicable. No additional management support is needed unless otherwise documented below in the visit note. 

## 2014-07-07 DIAGNOSIS — R059 Cough, unspecified: Secondary | ICD-10-CM | POA: Insufficient documentation

## 2014-07-07 DIAGNOSIS — R05 Cough: Secondary | ICD-10-CM | POA: Insufficient documentation

## 2014-07-07 DIAGNOSIS — H109 Unspecified conjunctivitis: Secondary | ICD-10-CM | POA: Insufficient documentation

## 2014-07-07 NOTE — Assessment & Plan Note (Addendum)
Worsening. Right eye. Rx for Z-pack for cough will have coverage for possible causes of bacterial, but is probable for viral conjunctivitis. Instructed to use warm wet cloth to clean eye lid and that this is contagious to others in the house. FU 2 weeks

## 2014-07-07 NOTE — Assessment & Plan Note (Addendum)
Stable. Rx for Z-pack since it is over 10 days without improvement, tessalon perles for cough, and Chest X-ray to rule out other causes of cough. FU 2 weeks

## 2014-07-25 ENCOUNTER — Ambulatory Visit (INDEPENDENT_AMBULATORY_CARE_PROVIDER_SITE_OTHER): Payer: PPO | Admitting: Internal Medicine

## 2014-07-25 ENCOUNTER — Encounter: Payer: Self-pay | Admitting: Internal Medicine

## 2014-07-25 VITALS — BP 112/62 | HR 67 | Temp 97.3°F | Ht 65.0 in | Wt 177.5 lb

## 2014-07-25 DIAGNOSIS — I10 Essential (primary) hypertension: Secondary | ICD-10-CM

## 2014-07-25 DIAGNOSIS — R059 Cough, unspecified: Secondary | ICD-10-CM

## 2014-07-25 DIAGNOSIS — F419 Anxiety disorder, unspecified: Secondary | ICD-10-CM

## 2014-07-25 DIAGNOSIS — R05 Cough: Secondary | ICD-10-CM

## 2014-07-25 NOTE — Patient Instructions (Signed)
Follow up for Wellness Visit in 12/2013.

## 2014-07-25 NOTE — Progress Notes (Signed)
Pre visit review using our clinic review tool, if applicable. No additional management support is needed unless otherwise documented below in the visit note. 

## 2014-07-25 NOTE — Progress Notes (Signed)
   Subjective:    Patient ID: Whitney Simmons, female    DOB: 1928/06/21, 81 y.o.   MRN: [id number]  HPI 81YO female presents for follow up.  Last seen 1/6 for cough - Treated with azithromycin. Symptoms have resolved.  HTN - Compliant with medications. Feeling well.  Notes some increased anxiety recently. Describes herself as a Research officer, trade [employer]. Does not want to take medication for this because of potential side effects. Not interested in counseling.   Past medical, surgical, family and social history per today's encounter.  Review of Systems  Constitutional: Negative for fever, chills, appetite change, fatigue and unexpected weight change.  Eyes: Negative for visual disturbance.  Respiratory: Negative for cough, chest tightness and shortness of breath.   Cardiovascular: Negative for chest pain and leg swelling.  Gastrointestinal: Negative for abdominal pain.  Skin: Negative for color change and rash.  Hematological: Negative for adenopathy. Does not bruise/bleed easily.  Psychiatric/Behavioral: Positive for sleep disturbance. Negative for suicidal ideas and dysphoric mood. The patient is nervous/anxious.        Objective:    BP 112/62 mmHg  Pulse 67  Temp(Src) 97.3 F (36.3 C) (Oral)  Ht 5\' 5"  (1.651 m)  Wt 177 lb 8 oz (80.513 kg)  BMI 29.54 kg/m2  SpO2 95% Physical Exam  Constitutional: She is oriented to person, place, and time. She appears well-developed and well-nourished. No distress.  HENT:  Head: Normocephalic and atraumatic.  Right Ear: External ear normal.  Left Ear: External ear normal.  Nose: Nose normal.  Mouth/Throat: Oropharynx is clear and moist. No oropharyngeal exudate.  Eyes: Conjunctivae are normal. Pupils are equal, round, and reactive to light. Right eye exhibits no discharge. Left eye exhibits no discharge. No scleral icterus.  Neck: Normal range of motion. Neck supple. No tracheal deviation present. No thyromegaly present.  Cardiovascular: Normal rate,  regular rhythm, normal heart sounds and intact distal pulses.  Exam reveals no gallop and no friction rub.   No murmur heard. Pulmonary/Chest: Effort normal and breath sounds normal. No accessory muscle usage. No tachypnea. No respiratory distress. She has no decreased breath sounds. She has no wheezes. She has no rhonchi. She has no rales. She exhibits no tenderness.  Musculoskeletal: Normal range of motion. She exhibits no edema or tenderness.  Lymphadenopathy:    She has no cervical adenopathy.  Neurological: She is alert and oriented to person, place, and time. No cranial nerve deficit. She exhibits normal muscle tone. Coordination normal.  Skin: Skin is warm and dry. No rash noted. She is not diaphoretic. No erythema. No pallor.  Psychiatric: Her behavior is normal. Judgment and thought content normal. Her mood appears anxious.          Assessment & Plan:   Problem List Items Addressed This Visit      Unprioritized   Anxiety    Chronic generalized anxiety. Recommended considering medication to help with this, she declines. Recommended counseling, she declines.      Cough    Symptoms have completely resolved. Exam normal. Will monitor for any recurrent symptoms.      Hypertension - Primary    BP Readings from Last 3 Encounters:  07/25/14 112/62  07/05/14 120/60  06/27/14 128/73   BP well controlled. Continue current medications.          Return in about 6 months (around 01/23/2015) for Wellness Visit.

## 2014-07-25 NOTE — Assessment & Plan Note (Signed)
Symptoms have completely resolved. Exam normal. Will monitor for any recurrent symptoms.

## 2014-07-25 NOTE — Assessment & Plan Note (Signed)
Chronic generalized anxiety. Recommended considering medication to help with this, she declines. Recommended counseling, she declines.

## 2014-07-25 NOTE — Assessment & Plan Note (Signed)
BP Readings from Last 3 Encounters:  07/25/14 112/62  07/05/14 120/60  06/27/14 128/73   BP well controlled. Continue current medications.

## 2014-07-28 ENCOUNTER — Telehealth: Payer: Self-pay | Admitting: Internal Medicine

## 2014-07-28 NOTE — Telephone Encounter (Signed)
emmi emailed °

## 2014-08-10 ENCOUNTER — Ambulatory Visit: Payer: Self-pay | Admitting: General Surgery

## 2014-08-10 ENCOUNTER — Encounter: Payer: Self-pay | Admitting: General Surgery

## 2014-08-28 ENCOUNTER — Encounter: Payer: Self-pay | Admitting: General Surgery

## 2014-08-28 ENCOUNTER — Ambulatory Visit (INDEPENDENT_AMBULATORY_CARE_PROVIDER_SITE_OTHER): Payer: PPO | Admitting: General Surgery

## 2014-08-28 VITALS — BP 130/68 | HR 70 | Resp 14 | Ht 67.0 in | Wt 178.0 lb

## 2014-08-28 DIAGNOSIS — D0512 Intraductal carcinoma in situ of left breast: Secondary | ICD-10-CM | POA: Diagnosis not present

## 2014-08-28 NOTE — Patient Instructions (Signed)
Patient will be asked to return to the office in six months with a bilateral diagnotic mammogram

## 2014-08-28 NOTE — Progress Notes (Signed)
Patient ID: Whitney Simmons, female   DOB: 10-09-1927, 81 y.o.   MRN: [id number]  Chief Complaint  Patient presents with  . Follow-up    mammogram    HPI Whitney Simmons is a 81 y.o. female who presents for a breast evaluation. The most recent left breast mammogram was done on 08/10/14 .  Patient does perform regular self breast checks and gets regular mammograms done.  The patient declined post excision radiation therapy and antiestrogen therapy at the time of her initial diagnosis.  HPI  Past Medical History  Diagnosis Date  . Irregular heart beat   . Thyroid disease     s/p radioactive iodine ablation 30 years ago  . Hypertension   . Hyperlipidemia   . Edema   . IBS (irritable bowel syndrome)   . 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 Declined postoperative radiation and antiestrogen therapy.    Past Surgical History  Procedure Laterality Date  . Cholecystectomy    . Cataract extraction    . Toe amputation      left 5th toe  . Breast surgery Left 02-15-14    wide excision    Family History  Problem Relation Age of Onset  . Heart disease Mother   . Heart disease Maternal Aunt     Social History History  Substance Use Topics  . Smoking status: Never Smoker   . Smokeless tobacco: Never Used  . Alcohol Use: No    Allergies  Allergen Reactions  . Levaquin [Levofloxacin In D5w] Other (See Comments)    hallucinations  . Sulfa Antibiotics     Hallucinations     Current Outpatient Prescriptions  Medication Sig Dispense Refill  . aspirin 81 MG tablet Take 81 mg by mouth daily.    . furosemide (LASIX) 20 MG tablet Take 1 tablet (20 mg total) by mouth daily. 90 tablet 3  . levothyroxine (SYNTHROID, LEVOTHROID) 100 MCG tablet Take 1 tablet (100 mcg total) by mouth daily. 90 tablet 3  . losartan (COZAAR) 50 MG tablet Take 1 tablet (50 mg total) by mouth daily. 90 tablet 3  . Multiple Vitamin (MULTIVITAMIN) tablet Take 1 tablet by  mouth daily.    . nebivolol (BYSTOLIC) 5 MG tablet Take 1 tablet (5 mg total) by mouth daily. 90 tablet 3   No current facility-administered medications for this visit.    Review of Systems Review of Systems  Constitutional: Negative.   Respiratory: Negative.   Cardiovascular: Negative.     Blood pressure 130/68, pulse 70, resp. rate 14, height 5\' 7"  (1.702 m), weight 178 lb (80.74 kg).  Physical Exam Physical Exam  Constitutional: She is oriented to person, place, and time. She appears well-developed and well-nourished.  Eyes: Conjunctivae are normal. No scleral icterus.  Neck: Neck supple.  Cardiovascular: Normal rate, regular rhythm and normal heart sounds.   Pulmonary/Chest: Effort normal and breath sounds normal. Right breast exhibits no inverted nipple, no mass, no nipple discharge, no skin change and no tenderness. Left breast exhibits no inverted nipple, no mass, no nipple discharge, no skin change and no tenderness.    Abdominal: Soft. Bowel sounds are normal. There is no tenderness.  Lymphadenopathy:    She has no cervical adenopathy.  Neurological: She is alert and oriented to person, place, and time.  Skin: Skin is warm and dry.    Data Reviewed Left breast diagnostic mammogram dated 08/10/2014 was reviewed. Postsurgical changes. BI-RADS-2.  Assessment  Doing well status post wide excision of left breast DCIS.    Plan    Recent literature suggests that radiation therapy for low-grade DCIS is not mandatory. This however does not include omission of antiestrogen therapy. Pros and cons of tamoxifen therapy were reviewed. The patient will consider this notify the office if she would like to entertain I'll of the medication. The importance of making use of it for 1 month in case she had vasomotor symptoms was discussed.  Patient will be asked to return to the office in six months with a bilateral diagnotic mammogram.     PCP. Dr. Ronette Deter  Robert Bellow 08/29/2014, 4:15 PM

## 2014-08-29 ENCOUNTER — Other Ambulatory Visit: Payer: Self-pay | Admitting: Internal Medicine

## 2014-08-29 ENCOUNTER — Encounter: Payer: Self-pay | Admitting: General Surgery

## 2014-08-29 NOTE — Telephone Encounter (Signed)
The patient only has 1 more day of medication.  levothyroxine (SYNTHROID, LEVOTHROID) 100 MCG tablet

## 2014-08-30 MED ORDER — LEVOTHYROXINE SODIUM 100 MCG PO TABS: 100.0000 ug | ORAL_TABLET | Freq: Every day | ORAL | Status: DC

## 2014-08-30 NOTE — Telephone Encounter (Signed)
Rx sent to pharmacy by escript  

## 2014-09-04 ENCOUNTER — Telehealth: Payer: Self-pay | Admitting: *Deleted

## 2014-09-04 NOTE — Telephone Encounter (Signed)
Pt called, insisting to speak with only Dr Gilford Rile or Almyra Free.  I advised her they were both busy with pts.  She then told me that she has a productive cough however she is unable to release it.  Pt states she can not go through another night worried whether she is going to wake up or not due to the phlegm.  Review of Dr Derry Skill schedule she has no openings until 3.9.16.  Please advise

## 2014-09-04 NOTE — Telephone Encounter (Signed)
Called pt to offer appt with Doss tomorrow, Pt asked if there was something OTC she could try instead.  Per Dr Gilford Rile, advised pt to try benadryl at bedtime, Claritin during the day and musinex withOUT decongestant.  Advised pt to let us know if she is not feeling better in the next few days after trying this.

## 2014-09-22 ENCOUNTER — Ambulatory Visit: Payer: Self-pay | Admitting: Podiatrist

## 2014-10-06 ENCOUNTER — Encounter: Payer: Self-pay | Admitting: Podiatrist

## 2014-10-06 ENCOUNTER — Ambulatory Visit (INDEPENDENT_AMBULATORY_CARE_PROVIDER_SITE_OTHER): Payer: PPO | Admitting: Podiatrist

## 2014-10-06 DIAGNOSIS — M79673 Pain in unspecified foot: Secondary | ICD-10-CM

## 2014-10-06 DIAGNOSIS — B351 Tinea unguium: Secondary | ICD-10-CM

## 2014-10-21 NOTE — Op Note (Signed)
PATIENT NAME:  Whitney Simmons, Whitney Simmons MR#:  [id number] DATE OF BIRTH:  04-28-1928  DATE OF PROCEDURE:  02/15/2014  DATE OF PROCEDURE: 02/15/2013.   PREOPERATIVE DIAGNOSIS: Low grade ductal carcinoma in situ, left breast.   POSTOPERATIVE DIAGNOSIS: Low grade ductal carcinoma in situ, left breast.   OPERATIVE PROCEDURE: Wire localization and wide excision of left breast, ductal carcinoma in situ.   SURGEON: Robert Bellow, MD.   ANESTHESIA: General by LMA. Marcaine 0.5% with 1:200,000 units of epinephrine, 30 mL.   ANESTHESIOLOGIST: Dr. Kayleen Memos.  ESTIMATED BLOOD LOSS: Minimal.   CLINICAL NOTE: This 81 year old woman recently had a mammogram showing a small area of microcalcifications. Stereotactic biopsy showed evidence of DCIS. She was thought to be a candidate for excision. Preoperative ultrasound was inconclusive as to the location of the biopsy cavity and for that reason, needle localization was completed by Dr. Autumn Patty in radiology this afternoon.   OPERATIVE NOTE: With the patient under adequate general anesthesia, the breast was prepped with ChloraPrep and draped. Ultrasound was used to identify the path of the localizing wire. A curvilinear incision from the 10 to the 1 o'clock position was carried down through the skin and subcutaneous tissue after instillation of local anesthetic. The skin was incised sharply and the remaining dissection completed with electrocautery. A 4 x 4 x 4 cm cube of a very soft breast adipose tissue was removed, orientated, and specimen radiograph obtained. The initial report from radiology was that the wire was completely included, but the clip was not located. Limited viewing of the specimen radiograph on the PACS system could not clearly identify the clip and the wound which had been completely closed was opened and plans were to excise a 1 cm rim of the original wide excision site. While this was in progress, the radiologist called back reporting on further  imaging. The clip was indeed in the center of the specimen.   The wound was then reclosed with 2-0 Vicryl figure-of-8 sutures for the deep layer and interrupted 4-0 Vicryl subcuticular suture for the skin. Benzoin and Steri-Strips were applied. Telfa pad, fluff gauze and the patient's own brassiere were applied. She tolerated the procedure well and was taken to the Recovery Room in stable condition.    ____________________________ Robert Bellow, MD jwb:ls D: 02/15/2014 20:29:53 ET T: 02/15/2014 20:40:58 ET JOB#: [id number]  cc: Robert Bellow, MD, <Dictator> Eduard Clos. Gilford Rile, MD Lucillia Corson Amedeo Kinsman MD ELECTRONICALLY SIGNED 02/16/2014 14:58

## 2014-10-21 NOTE — Consult Note (Signed)
Reason for Visit: This 81 year old Female patient presents to the clinic for initial evaluation of  breast cancer .   Referred by Dr. Bary Castilla.  Diagnosis:  Chief Complaint/Diagnosis   81 year old female status post wide local excision of the left breast stage 0 (Tis N0 M0) for adjuvant accelerated partial breast radiation tumor is ER/PR positive will be on tamoxifen therapy  Pathology Report pathology report reviewed   Imaging Report mammograms reviewed   Referral Report clinical notes reviewed   Planned Treatment Regimen possible accelerated partial breast radiation followed by tamoxifen therapy   HPI   patient is an 81 year old female who presents with an abnormal mammogram of her left breast showing an area of segmental linear calcifications spanning 3.3 cm in the upper inner quadrant of the right breast.she went to stereotactic guided biopsy positive for ductal carcinoma in situ strongly ER/PR positive. Went on to have a wide local excision with pathology positive for 3.2 cm area of grade 2 ductal carcinoma in situ with margins clear but close at 1 mm cranial and anterior. She has done well postoperatively. Patient been rather reluctant to consider postoperative radiation to a low is seen today for consultation. She is feeling well. She specifically denies any breast tenderness cough or bone pain.  Past Hx:    Breast Cancer:    Irritable Bowel Syndrome:    Hyperlipidemia:    Thyroid Disease:    Irregular Heart Beat:    HTN:    toe amputation:    Cataract Extraction:    Cholecystectomy:   Past, Family and Social History:  Past Medical History positive   Cardiovascular hyperlipidemia; hypertension; irregular heartbeat   Gastrointestinal irritable bowel   Endocrine hypothyroidism; iodine therapy 30 years prior   Past Surgical History cholecystectomy; cataract surgery, toe amputation   Family History positive   Family History Comments mother with heart disease,  maternal at with heart disease   Social History noncontributory   Additional Past Medical and Surgical History accompanied by husband today   Allergies:   Sulfa drugs: Hallucinations  Levofloxacin: Anxiety  Home Meds:  Home Medications: Medication Instructions Status  Tylenol 325 mg oral tablet 2 tab(s) orally every 4 hours, As Needed - for Pain Active  furosemide 20 mg oral tablet 1 tab(s) orally once a day, As Needed Active  Synthroid 100 mcg (0.1 mg) oral tablet 1 tab(s) orally once a day Active  aspirin 81 mg oral tablet 1 tab(s) orally once a day Active  multivitamin 1 tab(s) orally once a day Active  Bystolic 5 mg oral tablet 1 tab(s) orally once a day (in the morning) Active  losartan 50 mg oral tablet 1 tab(s) orally once a day (at bedtime) Active   Review of Systems:  General negative   Performance Status (ECOG) 0   Skin negative   Breast see HPI   Ophthalmologic negative   ENMT negative   Respiratory and Thorax negative   Cardiovascular negative   Gastrointestinal negative   Genitourinary negative   Musculoskeletal negative   Neurological negative   Psychiatric negative   Hematology/Lymphatics negative   Endocrine negative   Allergic/Immunologic negative   Review of Systems   denies any weight loss, fatigue, weakness, fever, chills or night sweats. Patient denies any loss of vision, blurred vision. Patient denies any ringing  of the ears or hearing loss. No irregular heartbeat. Patient denies heart murmur or history of fainting. Patient denies any chest pain or pain radiating to her upper extremities.  Patient denies any shortness of breath, difficulty breathing at night, cough or hemoptysis. Patient denies any swelling in the lower legs. Patient denies any nausea vomiting, vomiting of blood, or coffee ground material in the vomitus. Patient denies any stomach pain. Patient states has had normal bowel movements no significant constipation or diarrhea.  Patient denies any dysuria, hematuria or significant nocturia. Patient denies any problems walking, swelling in the joints or loss of balance. Patient denies any skin changes, loss of hair or loss of weight. Patient denies any excessive worrying or anxiety or significant depression. Patient denies any problems with insomnia. Patient denies excessive thirst, polyuria, polydipsia. Patient denies any swollen glands, patient denies easy bruising or easy bleeding. Patient denies any recent infections, allergies or URI. Patient "s visual fields have not changed significantly in recent time.   Nursing Notes:  Nursing Vital Signs and Chemo Nursing Nursing Notes: *CC Vital Signs Flowsheet:   03-Sep-15 09:20  Temp Temperature 96.4  Pulse Pulse 83  Respirations Respirations 20  SBP SBP 133  DBP DBP 80  Pain Scale (0-10)  0  Current Weight (kg) (kg) 85.7   Physical Exam:  General/Skin/HEENT:  General normal   Skin normal   Eyes normal   ENMT normal   Head and Neck normal   Additional PE well-developed elderly female in NAD. She is status post wide local excision of the left breast which is healing well. This appears to be some firmness around her scar site although no evidence of inflammation. No dominant mass or nodularity is noted in either breast in 2 positions examined. No axillary or supraclavicular adenopathy is appreciated. The lungs are clear to A&P cardiac examination shows regular rate and rhythm.   Breasts/Resp/CV/GI/GU:  Respiratory and Thorax normal   Cardiovascular normal   Gastrointestinal normal   Genitourinary normal   MS/Neuro/Psych/Lymph:  Musculoskeletal normal   Neurological normal   Lymphatics normal   Other Results:  Radiology Results: LabUnknown:    22-Jul-15 11:25, Digital Additional Views Lt Breast [facility])  PACS Image   [facility]:  Digital Additional Views Lt Breast (SCR)   REASON FOR EXAM:    av lt calcifications  COMMENTS:       PROCEDURE:  MAM - MAM DGTL ADD VW LT  SCR  - Jan 18 2014 11:25AM     CLINICAL DATA:  Abnormal left screening mammogram    EXAM:  DIGITAL DIAGNOSTIC  LEFT MAMMOGRAM    COMPARISON:  With priors.    ACR Breast Density Category b: There are scattered areas of  fibroglandular density.  FINDINGS:  Magnification views of the upper-inner quadrant of the right breast  were obtained. There are developing segmental linear calcifications  spanning an area of 3.3 cm. There is no associated mass.     IMPRESSION:  Suspicious calcifications in the left breast.    RECOMMENDATION:  Stereotactic biopsy of the left breast is recommended. This will be  scheduled patient's convenience.    I have discussed the findings and recommendations with the patient.  Results were also provided in writing at the conclusion of the  visit. If applicable, a reminder letter will be sent to the patient  regarding the next appointment.    BI-RADS CATEGORY  4: Suspicious.      Electronically Signed    By: Lillia Mountain M.D.    On: 01/18/2014 11:33         Verified By: Mordecai Rasmussen L. 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 Based on the size of her tumor being over 3 cm and close margin of 1 mm strongly believe she is probably around 50% risk of local recurrence if not given adjuvant radiation therapy. I explained the risks and benefits of treatment with the intent to deliver 3400 cGy in 10 fractions at 340 cGy twice a day. Risks and benefits of treatment including possible small area of skin reaction, permanent thickening of the lumpectomy site, fatigue, all were described in detail to the patient. I've also explained that if catheter is too close to chest wall  or skin may have to revert to whole breast radiation. Patient is in favor of going ahead with accelerated partial breast irradiation using high dose rate remote afterloading and brachy vision treatment planning. We will arrange MammoSite catheter placement with surgeon's office. Patient will also be a candidate for tamoxifen therapy after completion of radiation.  I would like to take this opportunity for allowing me to participate in the care of your patient..  Fax to Physician:  Physicians To Recieve Fax: Ronette Deter, MD - [phone].  Electronic Signatures: Armstead Peaks (MD)  (Signed 03-Sep-15 10:15)  Authored: HPI, Diagnosis, Past Hx, PFSH, Allergies, Home Meds, ROS, Nursing Notes, Physical Exam, Other Results, Relevent Results, Encounter Assessment and Plan, Fax to Physician   Last Updated: 03-Sep-15 10:15 by Armstead Peaks (MD)

## 2014-10-24 NOTE — Progress Notes (Signed)
HPI: Patient presents today for follow up of foot and nail care. Denies any new complaints today.   Objective: Patients chart is reviewed. Neurovascular status unchanged. Pedal pulses palpable at 1/4 dp and pt bilateral. Patients nails are thickened, discolored, distrophic, friable and brittle with yellow-brown discoloration. Patient subjectively relates they are painful with shoes and with ambulation of bilateral feet. Decreased fat pad noted plantar feet bilateral. 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 Right hallux nail is incurvated and uncomfortable - slight redness to the toe is also noted.   Assessment: Symptomatic onychomycosis x 9   Plan: Discussed treatment options and alternatives. The symptomatic toenails were debrided through manual an mechanical means without complication. Return appointment recommended at routine intervals of 3 month. Recommended to call if any problems or concerns arise

## 2014-11-22 ENCOUNTER — Telehealth: Payer: Self-pay | Admitting: *Deleted

## 2014-11-22 NOTE — Telephone Encounter (Signed)
Pt having IBS symptoms for a few weeks, bloated, diarrhea some days.  Pt scheduled an appt, advised if symptoms worsen or becomes more concerned to call back and we can schedule appt with NP.

## 2014-12-04 ENCOUNTER — Ambulatory Visit: Payer: PPO | Admitting: Internal Medicine

## 2014-12-04 ENCOUNTER — Encounter: Payer: Self-pay | Admitting: Nurse Practitioner

## 2014-12-04 ENCOUNTER — Ambulatory Visit (INDEPENDENT_AMBULATORY_CARE_PROVIDER_SITE_OTHER): Payer: PPO | Admitting: Nurse Practitioner

## 2014-12-04 VITALS — BP 118/62 | HR 65 | Temp 98.0°F | Resp 14 | Ht 65.0 in | Wt 178.1 lb

## 2014-12-04 DIAGNOSIS — H6121 Impacted cerumen, right ear: Secondary | ICD-10-CM | POA: Diagnosis not present

## 2014-12-04 MED ORDER — FLUTICASONE PROPIONATE 50 MCG/ACT NA SUSP: 2.0000 | Freq: Every day | NASAL | Status: DC

## 2014-12-04 NOTE — Progress Notes (Signed)
   Subjective:    Patient ID: Whitney Simmons, female    DOB: Sep 07, 1927, 81 y.o.   MRN: [id number]  HPI  Whitney Simmons is a 81 yo female with a CC of ear fullness.   1) R> L, removed bilateral hearing aids. Reports some drainage and no recent URI's. Denies decrease in hearing. Denies treatment recently and has seen ENT - Dr. Tami Ribas in past. Denies using q-tips.   Review of Systems  Constitutional: Negative for fever, chills, diaphoresis and fatigue.  HENT: Positive for postnasal drip. Negative for ear discharge, ear pain and hearing loss.   Respiratory: Negative for chest tightness, shortness of breath and wheezing.   Cardiovascular: Negative for chest pain, palpitations and leg swelling.  Gastrointestinal: Negative for nausea, vomiting and diarrhea.  Skin: Negative for rash.       Objective:   Physical Exam  Constitutional: She is oriented to person, place, and time. She appears well-developed and well-nourished. No distress.  BP 118/62 mmHg  Pulse 65  Temp(Src) 98 F (36.7 C) (Oral)  Resp 14  Ht 5\' 5"  (1.651 m)  Wt 178 lb 1.9 oz (80.795 kg)  BMI 29.64 kg/m2  SpO2 97%   HENT:  Head: Normocephalic and atraumatic.  Right Ear: External ear normal.  Left Ear: External ear normal.  Mouth/Throat: No oropharyngeal exudate.  TM on Right- visible, but has yellow cerumen up against the TM and it is thin. Canal is normal and without excessive cerumen.   TM on Left- pearly gray and landmarks visible, slightly injected, no other signs of infection or fluid.   Eyes: EOM are normal. Pupils are equal, round, and reactive to light. Right eye exhibits no discharge. Left eye exhibits no discharge. No scleral icterus.  Cardiovascular: Normal rate, regular rhythm and normal heart sounds.  Exam reveals no gallop and no friction rub.   No murmur heard. Pulmonary/Chest: Effort normal and breath sounds normal. No respiratory distress. She has no wheezes. She has no rales. She exhibits no  tenderness.  Neurological: She is alert and oriented to person, place, and time. No cranial nerve deficit. She exhibits normal muscle tone. Coordination normal.  Skin: Skin is warm and dry. No rash noted. She is not diaphoretic.  Psychiatric: She has a normal mood and affect. Her behavior is normal. Judgment and thought content normal.      Assessment & Plan:

## 2014-12-04 NOTE — Patient Instructions (Addendum)
Flonase was sent to your pharmacy.   Please make an appointment with Dr. Tami Ribas to see if they can remove the earwax next to your eardrum.

## 2014-12-04 NOTE — Assessment & Plan Note (Signed)
Pt reports R>L ear fullness. She has bilateral hearing aids. Will try Flonase in the meantime for symptom relief. Asked her to follow up with ENT due to thin cerumen on TM. Irrigation is not helpful at this point.

## 2014-12-04 NOTE — Progress Notes (Signed)
Pre visit review using our clinic review tool, if applicable. No additional management support is needed unless otherwise documented below in the visit note. 

## 2014-12-09 ENCOUNTER — Other Ambulatory Visit: Payer: Self-pay | Admitting: Internal Medicine

## 2014-12-11 ENCOUNTER — Telehealth: Payer: Self-pay

## 2014-12-11 NOTE — Telephone Encounter (Signed)
Request was already sent in by pharmacy and approved for 1 mth.  Pt will need an appt before any other refills are approved

## 2014-12-11 NOTE — Telephone Encounter (Signed)
The pt called and is hoping to get a refill on her synthroid medication.    Pt callback - [phone]

## 2015-01-04 ENCOUNTER — Other Ambulatory Visit: Payer: Self-pay

## 2015-01-04 DIAGNOSIS — D0512 Intraductal carcinoma in situ of left breast: Secondary | ICD-10-CM

## 2015-01-08 ENCOUNTER — Encounter: Payer: Self-pay | Admitting: Internal Medicine

## 2015-01-08 ENCOUNTER — Other Ambulatory Visit: Payer: Self-pay | Admitting: *Deleted

## 2015-01-08 MED ORDER — NEBIVOLOL HCL 5 MG PO TABS: 5.0000 mg | ORAL_TABLET | Freq: Every day | ORAL | Status: DC

## 2015-01-09 ENCOUNTER — Other Ambulatory Visit: Payer: Self-pay | Admitting: Internal Medicine

## 2015-01-09 NOTE — Telephone Encounter (Signed)
Last TSH 6.12.15.  Please advise refill

## 2015-01-10 ENCOUNTER — Other Ambulatory Visit: Payer: Self-pay | Admitting: *Deleted

## 2015-01-11 ENCOUNTER — Ambulatory Visit: Payer: PPO | Admitting: Podiatry

## 2015-01-15 ENCOUNTER — Ambulatory Visit: Payer: PPO | Admitting: Podiatry

## 2015-01-18 ENCOUNTER — Telehealth: Payer: Self-pay

## 2015-01-18 NOTE — Telephone Encounter (Signed)
Called patient to see if she would be willing to be seen by the Health Coach at her upcoming wellness visit for the same date and time.  Currently scheduled with PCP.  No answer.

## 2015-01-22 ENCOUNTER — Ambulatory Visit (INDEPENDENT_AMBULATORY_CARE_PROVIDER_SITE_OTHER): Payer: PPO | Admitting: Podiatry

## 2015-01-22 ENCOUNTER — Encounter: Payer: Self-pay | Admitting: Podiatry

## 2015-01-22 DIAGNOSIS — M79673 Pain in unspecified foot: Secondary | ICD-10-CM

## 2015-01-22 DIAGNOSIS — B351 Tinea unguium: Secondary | ICD-10-CM

## 2015-01-22 NOTE — Progress Notes (Signed)
Patient presents to the office today with a chief complaint of painful elongated toenails.  Objective: Pulses are palpable bilateral. Nails are thick yellow dystrophic clinically mycotic and painful palpation.  Assessment: Pain in limb secondary to onychomycosis 1 through 5 bilateral.  Plan: Debridement of nails 1 through 5 bilateral covered service secondary to pain.  

## 2015-01-23 ENCOUNTER — Telehealth: Payer: Self-pay | Admitting: *Deleted

## 2015-01-23 ENCOUNTER — Other Ambulatory Visit: Payer: PPO

## 2015-01-23 ENCOUNTER — Encounter: Payer: Self-pay | Admitting: Internal Medicine

## 2015-01-23 ENCOUNTER — Ambulatory Visit (INDEPENDENT_AMBULATORY_CARE_PROVIDER_SITE_OTHER): Payer: PPO | Admitting: Internal Medicine

## 2015-01-23 VITALS — BP 130/68 | HR 61 | Temp 98.0°F | Ht 64.0 in | Wt 181.4 lb

## 2015-01-23 DIAGNOSIS — I10 Essential (primary) hypertension: Secondary | ICD-10-CM | POA: Diagnosis not present

## 2015-01-23 DIAGNOSIS — E785 Hyperlipidemia, unspecified: Secondary | ICD-10-CM

## 2015-01-23 DIAGNOSIS — Z780 Asymptomatic menopausal state: Secondary | ICD-10-CM | POA: Insufficient documentation

## 2015-01-23 DIAGNOSIS — Z0000 Encounter for general adult medical examination without abnormal findings: Secondary | ICD-10-CM

## 2015-01-23 DIAGNOSIS — D0512 Intraductal carcinoma in situ of left breast: Secondary | ICD-10-CM

## 2015-01-23 DIAGNOSIS — E039 Hypothyroidism, unspecified: Secondary | ICD-10-CM

## 2015-01-23 LAB — COMPREHENSIVE METABOLIC PANEL
ALT: 11 U/L (ref 0–35)
AST: 16 U/L (ref 0–37)
Albumin: 3.7 g/dL (ref 3.5–5.2)
Alkaline Phosphatase: 77 U/L (ref 39–117)
BUN: 16 mg/dL (ref 6–23)
CO2: 26 mEq/L (ref 19–32)
Calcium: 9.3 mg/dL (ref 8.4–10.5)
Chloride: 104 mEq/L (ref 96–112)
Creatinine, Ser: 1.07 mg/dL (ref 0.40–1.20)
GFR: 51.78 mL/min — ABNORMAL LOW (ref >60.00)
Glucose, Bld: 97 mg/dL (ref 70–99)
Potassium: 4.4 mEq/L (ref 3.5–5.1)
Sodium: 138 mEq/L (ref 135–145)
Total Bilirubin: 0.7 mg/dL (ref 0.2–1.2)
Total Protein: 7.2 g/dL (ref 6.0–8.3)

## 2015-01-23 LAB — LIPID PANEL
Cholesterol: 197 mg/dL (ref 0–200)
HDL: 38.5 mg/dL — ABNORMAL LOW (ref >39.00)
LDL Cholesterol: 128 mg/dL — ABNORMAL HIGH (ref 0–99)
NonHDL: 158.5
Total CHOL/HDL Ratio: 5
Triglycerides: 154 mg/dL — ABNORMAL HIGH (ref 0.0–149.0)
VLDL: 30.8 mg/dL (ref 0.0–40.0)

## 2015-01-23 LAB — TSH: TSH: 0.73 u[IU]/mL (ref 0.35–4.50)

## 2015-01-23 NOTE — Progress Notes (Signed)
Subjective:    Patient ID: Whitney Simmons, female    DOB: Feb 27, 1928, 81 y.o.   MRN: [id number]  HPI  The patient is here for annual Medicare Wellness Examination and management of other chronic and acute problems.   The risk factors are reflected in the history.  The roster of all physicians providing medical care to patient - is listed in the Snapshot section of the chart.  Activities of daily living:   The patient is 100% independent in all ADLs: dressing, toileting, feeding as well as independent mobility. Patient lives with husband. No pets. Lives at the [place] at [facility].  Home safety :  The patient has smoke detectors in the home.  They wear seatbelts in their car. There are no firearms at home.  There is no violence in the home. They feel safe where they live.  Infectious Risks: There is no risks for hepatitis, STDs or HIV.  There is no  history of blood transfusion.  They have no travel history to infectious disease endemic areas of the world.  Additional Health Care Providers: The patient has seen their dentist in the last six months. Dentist - [place] They have seen their eye doctor in the last year. Diagnosed with glaucoma and macular degeneration. Opthalmologist - [facility] They deny hearing issues. They have deferred audiologic testing in the last year.   They do not  have excessive sun exposure. Discussed the need for sun protection: hats,long sleeves and use of sunscreen if there is significant sun exposure.  Dermatologist - none at present Surgeon - Dr. Bary Castilla  Diet: the importance of a healthy diet is discussed. They do have a healthy diet.  The benefits of regular aerobic exercise were discussed. Patient exercises by walks occasionally.  Depression screen: there are no signs or vegative symptoms of depression- irritability, change in appetite, anhedonia, sadness/tearfullness.  Cognitive assessment: the patient manages all their financial and  personal affairs and is actively engaged. They could relate day,date,year and events. Manages her own finances.  [place] Will - yes  The following portions of the patient's history were reviewed and updated as appropriate: allergies, current medications, past family history, past medical history,  past surgical history, past social history and problem list.  Visual acuity was not assessed per patient preference as they have regular follow up with their ophthalmologist. Hearing and body mass index were assessed and reviewed.   During the course of the visit the patient was educated and counseled about appropriate screening and preventive services including : fall prevention , diabetes screening, nutrition counseling, colorectal cancer screening, and recommended immunizations.     Past medical, surgical, family and social history per today's encounter.  Review of Systems  Constitutional: Negative for fever, chills, appetite change, fatigue and unexpected weight change.  HENT: Negative for trouble swallowing.   Eyes: Negative for visual disturbance.  Respiratory: Negative for shortness of breath.   Cardiovascular: Negative for chest pain and leg swelling.  Gastrointestinal: Negative for vomiting, abdominal pain, diarrhea and constipation.  Musculoskeletal: Negative for myalgias, arthralgias and gait problem.  Skin: Negative for color change and rash.  Hematological: Negative for adenopathy. Does not bruise/bleed easily.  Psychiatric/Behavioral: Negative for suicidal ideas, sleep disturbance and dysphoric mood. The patient is not nervous/anxious.        Objective:    BP 130/68 mmHg  Pulse 61  Temp(Src) 98 F (36.7 C) (Oral)  Ht 5\' 4"  (1.626 m)  Wt 181  lb 6 oz (82.271 kg)  BMI 31.12 kg/m2  SpO2 95% Physical Exam  Constitutional: She is oriented to person, place, and time. She appears well-developed and well-nourished. No distress.  HENT:  Head: Normocephalic  and atraumatic.  Right Ear: External ear normal.  Left Ear: External ear normal.  Nose: Nose normal.  Mouth/Throat: Oropharynx is clear and moist. No oropharyngeal exudate.  Eyes: Conjunctivae and EOM are normal. Pupils are equal, round, and reactive to light. Right eye exhibits no discharge.  Neck: Normal range of motion. Neck supple. No thyromegaly present.  Cardiovascular: Normal rate, regular rhythm, normal heart sounds and intact distal pulses.  Exam reveals no gallop and no friction rub.   No murmur heard. Pulmonary/Chest: Effort normal. No respiratory distress. She has no wheezes. She has no rales.  Abdominal: Soft. Bowel sounds are normal. She exhibits no distension and no mass. There is no tenderness. There is no rebound and no guarding.  Musculoskeletal: Normal range of motion. She exhibits no edema or tenderness.  Lymphadenopathy:    She has no cervical adenopathy.  Neurological: She is alert and oriented to person, place, and time. No cranial nerve deficit. Coordination normal.  Skin: Skin is warm and dry. No rash noted. She is not diaphoretic. No erythema. No pallor.  Psychiatric: She has a normal mood and affect. Her behavior is normal. Judgment and thought content normal.          Assessment & Plan:  Patient was given a handout regarding current recommendations for health maintenance and preventative care on the AVS.  Problem List Items Addressed This Visit      Unprioritized   DCIS (ductal carcinoma in situ) of breast    Follow up mammogram and evaluation with Dr. Bary Castilla pending.      Hyperlipidemia    Will check lipids with labs today.      Relevant Orders   Lipid panel   Hypertension    BP Readings from Last 3 Encounters:  01/23/15 130/68  12/04/14 118/62  08/28/14 130/68   BP well controlled. Renal function with labs. Continue current medications.      Relevant Orders   Comprehensive metabolic panel   Hypothyroidism    Will check TSH with labs.  Continue Levothyroxine.      Relevant Orders   TSH   Medicare annual wellness visit, subsequent - Primary    General medical exam normal today. PAP and pelvic deferred given pt age and preference. Mammogram has been scheduled for 01/2015 with follow up with Dr. Bary Castilla afterward. Labs today as ordered. Encouraged continued efforts at healthy diet and regular physical activity.Immunizations are UTD except for Zostavax which was declined.         Relevant Orders   CBC with Differential/Platelet   Postmenopausal estrogen deficiency    Will schedule bone density testing.      Relevant Orders   DG Bone Density       Return in about 6 months (around 07/26/2015) for Recheck.

## 2015-01-23 NOTE — Assessment & Plan Note (Signed)
BP Readings from Last 3 Encounters:  01/23/15 130/68  12/04/14 118/62  08/28/14 130/68   BP well controlled. Renal function with labs. Continue current medications.

## 2015-01-23 NOTE — Assessment & Plan Note (Signed)
Will schedule bone density testing.

## 2015-01-23 NOTE — Assessment & Plan Note (Signed)
Follow up mammogram and evaluation with Dr. Bary Castilla pending.

## 2015-01-23 NOTE — Assessment & Plan Note (Signed)
General medical exam normal today. PAP and pelvic deferred given pt age and preference. Mammogram has been scheduled for 01/2015 with follow up with Dr. Bary Castilla afterward. Labs today as ordered. Encouraged continued efforts at healthy diet and regular physical activity.Immunizations are UTD except for Zostavax which was declined.

## 2015-01-23 NOTE — Assessment & Plan Note (Signed)
Will check TSH with labs. Continue Levothyroxine. 

## 2015-01-23 NOTE — Assessment & Plan Note (Signed)
Will check lipids with labs today.  

## 2015-01-23 NOTE — Patient Instructions (Signed)

## 2015-01-23 NOTE — Telephone Encounter (Signed)
Lab called saying they needed a recollect on cbc and to draw a blue top (pt had a lot of clumping) - spoke to pt she said she will come in either this afternoon or tomorrow

## 2015-01-23 NOTE — Progress Notes (Signed)
Pre visit review using our clinic review tool, if applicable. No additional management support is needed unless otherwise documented below in the visit note. 

## 2015-01-24 LAB — CBC WITH DIFFERENTIAL/PLATELET
Basophils Absolute: 0.1 10*3/uL (ref 0.0–0.1)
Basophils Relative: 1.2 % (ref 0.0–3.0)
Eosinophils Absolute: 0.1 10*3/uL (ref 0.0–0.7)
Eosinophils Relative: 2.4 % (ref 0.0–5.0)
HCT: 40.3 % (ref 36.0–46.0)
Hemoglobin: 13.1 g/dL (ref 12.0–15.0)
Lymphocytes Relative: 18.7 % (ref 12.0–46.0)
Lymphs Abs: 1.1 10*3/uL (ref 0.7–4.0)
MCHC: 32.6 g/dL (ref 30.0–36.0)
MCV: 90.2 fl (ref 78.0–100.0)
Monocytes Absolute: 0.6 10*3/uL (ref 0.1–1.0)
Monocytes Relative: 9.7 % (ref 3.0–12.0)
Neutro Abs: 4 10*3/uL (ref 1.4–7.7)
Neutrophils Relative %: 68 % (ref 43.0–77.0)
Platelets: 127 10*3/uL — ABNORMAL LOW (ref 150.0–400.0)
RBC: 4.47 Mil/uL (ref 3.87–5.11)
RDW: 13.5 % (ref 11.5–15.5)
WBC: 5.9 10*3/uL (ref 4.0–10.5)

## 2015-01-24 NOTE — Addendum Note (Signed)
Addended by: Karlene Einstein D on: 01/24/2015 11:29 AM   Modules accepted: Orders

## 2015-01-25 ENCOUNTER — Encounter: Payer: Self-pay | Admitting: Internal Medicine

## 2015-01-25 ENCOUNTER — Other Ambulatory Visit: Payer: Self-pay | Admitting: *Deleted

## 2015-01-25 MED ORDER — LEVOTHYROXINE SODIUM 100 MCG PO TABS: 100.0000 ug | ORAL_TABLET | Freq: Every day | ORAL | Status: DC

## 2015-01-26 ENCOUNTER — Telehealth: Payer: Self-pay | Admitting: *Deleted

## 2015-01-26 NOTE — Telephone Encounter (Signed)
Called our lab about the re-check on [place] said pt had less than 20% platelet clumps so they were able to report the original platelet count 127 and a comment was put in results due to platelet clumping

## 2015-01-30 ENCOUNTER — Other Ambulatory Visit: Payer: Self-pay | Admitting: General Surgery

## 2015-01-30 ENCOUNTER — Ambulatory Visit
Admission: RE | Admit: 2015-01-30 | Discharge: 2015-01-30 | Disposition: A | Discharge status: Home / Self Care | Payer: PPO | Source: Ambulatory Visit | Attending: General Surgery | Admitting: General Surgery

## 2015-01-30 ENCOUNTER — Ambulatory Visit: Payer: PPO

## 2015-01-30 DIAGNOSIS — R921 Mammographic calcification found on diagnostic imaging of breast: Secondary | ICD-10-CM | POA: Insufficient documentation

## 2015-01-30 DIAGNOSIS — Z853 Personal history of malignant neoplasm of breast: Secondary | ICD-10-CM | POA: Diagnosis not present

## 2015-01-30 DIAGNOSIS — D0512 Intraductal carcinoma in situ of left breast: Secondary | ICD-10-CM

## 2015-01-30 HISTORY — DX: Malignant neoplasm of unspecified site of unspecified female breast: C50.919

## 2015-01-31 ENCOUNTER — Encounter: Payer: Self-pay | Admitting: *Deleted

## 2015-02-08 ENCOUNTER — Ambulatory Visit (INDEPENDENT_AMBULATORY_CARE_PROVIDER_SITE_OTHER): Payer: PPO | Admitting: General Surgery

## 2015-02-08 ENCOUNTER — Encounter: Payer: Self-pay | Admitting: General Surgery

## 2015-02-08 VITALS — BP 130/70 | HR 72 | Resp 12 | Ht 65.0 in | Wt 182.0 lb

## 2015-02-08 DIAGNOSIS — D0512 Intraductal carcinoma in situ of left breast: Secondary | ICD-10-CM

## 2015-02-08 NOTE — Patient Instructions (Signed)
The patient has been asked to return to the office in one year with a bilateral diagnostic mammogram. 

## 2015-02-08 NOTE — Progress Notes (Signed)
Patient ID: Whitney Simmons, female   DOB: 11-Feb-1928, 81 y.o.   MRN: [id number]  Chief Complaint  Patient presents with  . Follow-up    mammogram    HPI Whitney Simmons is a 81 y.o. female who presents for a breast evaluation. The most recent mammogram was done on 01/30/15.  Patient does perform regular self breast checks and gets regular mammograms done.    HPI  Past Medical History  Diagnosis Date  . Irregular heart beat   . Thyroid disease     s/p radioactive iodine ablation 30 years ago  . Hypertension   . Hyperlipidemia   . Edema   . IBS (irritable bowel syndrome)   . 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 Declined postoperative radiation and antiestrogen therapy.  . Breast cancer 2015    LT LUMPECTOMY    Past Surgical History  Procedure Laterality Date  . Cholecystectomy    . Cataract extraction    . Toe amputation      left 5th toe  . Breast surgery Left 02-15-14    wide excision    Family History  Problem Relation Age of Onset  . Heart disease Mother   . Heart disease Maternal Aunt     Social History Social History  Substance Use Topics  . Smoking status: Never Smoker   . Smokeless tobacco: Never Used  . Alcohol Use: No    Allergies  Allergen Reactions  . Levaquin [Levofloxacin In D5w] Other (See Comments)    hallucinations  . Sulfa Antibiotics     Hallucinations     Current Outpatient Prescriptions  Medication Sig Dispense Refill  . aspirin 81 MG tablet Take 81 mg by mouth daily.    . fluticasone (FLONASE) 50 MCG/ACT nasal spray Place 2 sprays into both nostrils daily. 16 g 6  . furosemide (LASIX) 20 MG tablet Take 1 tablet (20 mg total) by mouth daily. 90 tablet 3  . levothyroxine (SYNTHROID, LEVOTHROID) 100 MCG tablet Take 1 tablet (100 mcg total) by mouth daily before breakfast. 90 tablet 1  . losartan (COZAAR) 50 MG tablet Take 1 tablet (50 mg total) by mouth daily. 90 tablet 3  . Multiple Vitamin  (MULTIVITAMIN) tablet Take 1 tablet by mouth daily.    . nebivolol (BYSTOLIC) 5 MG tablet Take 1 tablet (5 mg total) by mouth daily. 90 tablet 3  . timolol (BETIMOL) 0.5 % ophthalmic solution Place 1 drop into both eyes daily.     No current facility-administered medications for this visit.    Review of Systems Review of Systems  Constitutional: Negative.   Respiratory: Negative.   Cardiovascular: Negative.     Blood pressure 130/70, pulse 72, resp. rate 12, height 5\' 5"  (1.651 m), weight 182 lb (82.555 kg).  Physical Exam Physical Exam  Constitutional: She is oriented to person, place, and time. She appears well-developed and well-nourished.  Eyes: Conjunctivae are normal. No scleral icterus.  Neck: Neck supple.  Cardiovascular: Normal rate, regular rhythm and normal heart sounds.   Pulmonary/Chest: Effort normal and breath sounds normal. Right breast exhibits no inverted nipple, no mass, no nipple discharge, no skin change and no tenderness. Left breast exhibits no inverted nipple, no mass, no nipple discharge, no skin change and no tenderness.    Thickening at left lumpectomy site without associated volume loss. No tenderness. Normal overlying skin.  Lymphadenopathy:    She has no cervical adenopathy.    She  has no axillary adenopathy.  Neurological: She is alert and oriented to person, place, and time.  Skin: Skin is warm and dry.    Data Reviewed Bilateral diagnostic mammograms dated 01/30/2015 were based bar independently reviewed and compared to her 2015 preprocedure studies. Oil cysts have formed at the site of the wide excision. Note changes in the multiple groups of calcifications within the right breast are detected. BI-RADS-2.  Assessment    Doing well now one year out from management of her left breast DCIS.    Plan    We had a discussion regarding radiation (legitimately decline) and antiestrogen therapy. There is no data to support admitting antiestrogen  therapy in spite of her age, but she is concerned with a litany of side effects associated with tamoxifen and once again declines. I told her I would not bother her about this again.  She was given the option of having her annual mammograms with her PCP, but for at least 1 more unit year requested that she return here.    The patient has been asked to return to the office in one year with a bilateral diagnostic mammogram.   PCP:  Sonnie Alamo 02/08/2015, 9:14 AM

## 2015-02-15 ENCOUNTER — Ambulatory Visit
Admission: RE | Admit: 2015-02-15 | Discharge: 2015-02-15 | Disposition: A | Discharge status: Home / Self Care | Payer: PPO | Source: Ambulatory Visit | Attending: Internal Medicine | Admitting: Internal Medicine

## 2015-02-15 DIAGNOSIS — Z780 Asymptomatic menopausal state: Secondary | ICD-10-CM | POA: Diagnosis present

## 2015-02-15 DIAGNOSIS — M8580 Other specified disorders of bone density and structure, unspecified site: Secondary | ICD-10-CM | POA: Diagnosis not present

## 2015-02-19 ENCOUNTER — Other Ambulatory Visit: Payer: Self-pay | Admitting: Internal Medicine

## 2015-03-07 ENCOUNTER — Encounter: Payer: Self-pay | Admitting: Internal Medicine

## 2015-03-07 ENCOUNTER — Ambulatory Visit (INDEPENDENT_AMBULATORY_CARE_PROVIDER_SITE_OTHER): Payer: PPO | Admitting: Internal Medicine

## 2015-03-07 VITALS — BP 111/64 | HR 73 | Temp 97.7°F | Ht 64.0 in | Wt 184.2 lb

## 2015-03-07 DIAGNOSIS — M810 Age-related osteoporosis without current pathological fracture: Secondary | ICD-10-CM

## 2015-03-07 MED ORDER — LOSARTAN POTASSIUM 50 MG PO TABS: 50.0000 mg | ORAL_TABLET | Freq: Every day | ORAL | Status: DC

## 2015-03-07 NOTE — Progress Notes (Signed)
Pre visit review using our clinic review tool, if applicable. No additional management support is needed unless otherwise documented below in the visit note. 

## 2015-03-07 NOTE — Assessment & Plan Note (Signed)
Reviewed recent bone density testing which showed osteopenia with T-score -1.6. Discussed potential treatment including adequate dietary calcium, supplemental vit D, and bisphosphonates. She prefers not to start medication. Will start Vit D supplement 2000units daily and increase physical activity. Follow up with repeat bone density in 2018.

## 2015-03-07 NOTE — Patient Instructions (Signed)
Start Vit D 2000units daily. Consider starting back at exercise class with resistance training.  Osteoporosis Osteoporosis happens when your bones become weak because of bone loss. Weak bones can break (fracture) more easily with slips or falls. You are more likely to develop osteoporosis if:  You are a woman.  You are older than 50 years.  You are white or Asian.  You are very thin.  Someone in your family has had osteoporosis.  You smoke or use nicotine. CAUSES   Smoking.  Too much drinking.  Being a weight below normal.  Not being active.  Not going outside in the sun enough.  Certain medical conditions, such as diabetes or Crohn disease.  Certain medicines, such as steroids or antiseizure medicines. TREATMENT  The goal of treatment is to strengthen bones. There are different types of medicines that help your bones. Some medicines make your bones more solid. Some medicines help to slow down how much bone you lose. Your doctor may check to see if you are getting enough calcium and vitamin D in your diet. PREVENTION   Make sure you get enough calcium and vitamin D.  Make sure you exercise often.  If you smoke, quit. MAKE SURE YOU:  Understand these instructions.  Will watch your condition.  Will get help right away if you are not doing well or get worse. Document Released: 09/08/2011 Document Reviewed: 09/08/2011 [facility] Patient Information 2015 [place]. This information is not intended to replace advice given to you by your health care provider. Make sure you discuss any questions you have with your health care provider.

## 2015-03-07 NOTE — Progress Notes (Signed)
Subjective:    Patient ID: Whitney Simmons, female    DOB: [birth year], 81 y.o.   MRN: [id number]  HPI  81YO female presents to follow up bone density testing.  Several concerns about her husband and his health issues.  Bone density - T-score - -1.6. No previous for comparison. She has never had a fracture. She is taking an MVI but no Vit D supplement. She has quit exercising. She prefers not to start medications.   Past Medical History  Diagnosis Date  . Irregular heart beat   . Thyroid disease     s/p radioactive iodine ablation 30 years ago  . Hypertension   . Hyperlipidemia   . Edema   . IBS (irritable bowel syndrome)   . 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 Declined postoperative radiation and antiestrogen therapy.  . Breast cancer 2015    LT LUMPECTOMY   Family History  Problem Relation Age of Onset  . Heart disease Mother   . Heart disease Maternal Aunt    Past Surgical History  Procedure Laterality Date  . Cholecystectomy    . Cataract extraction    . Toe amputation      left 5th toe  . Breast surgery Left 02-15-14    wide excision   Social History   Social History  . Marital Status: Married    Spouse Name: N/A  . Number of Children: N/A  . Years of Education: N/A   Social History Main Topics  . Smoking status: Never Smoker   . Smokeless tobacco: Never Used  . Alcohol Use: No  . Drug Use: No  . Sexual Activity: Not Asked   Other Topics Concern  . None   Social History Narrative   Lives in [place]. From [place]. Has daughter.      Work- Retired Pharmacist, hospital    Review of Systems  Constitutional: Negative for fever, chills, appetite change, fatigue and unexpected weight change.  Eyes: Negative for visual disturbance.  Respiratory: Negative for shortness of breath.   Cardiovascular: Negative for chest pain and leg swelling.  Gastrointestinal: Negative for nausea, vomiting, abdominal pain,  diarrhea and constipation.  Musculoskeletal: Negative for myalgias and arthralgias.  Skin: Negative for color change and rash.  Hematological: Negative for adenopathy. Does not bruise/bleed easily.  Psychiatric/Behavioral: Negative for sleep disturbance and dysphoric mood. The patient is not nervous/anxious.        Objective:    BP 111/64 mmHg  Pulse 73  Temp(Src) 97.7 F (36.5 C) (Oral)  Ht 5\' 4"  (1.626 m)  Wt 184 lb 4 oz (83.575 kg)  BMI 31.61 kg/m2  SpO2 95% Physical Exam  Constitutional: She is oriented to person, place, and time. She appears well-developed and well-nourished. No distress.  HENT:  Head: Normocephalic and atraumatic.  Right Ear: External ear normal.  Left Ear: External ear normal.  Nose: Nose normal.  Mouth/Throat: Oropharynx is clear and moist. No oropharyngeal exudate.  Eyes: Conjunctivae are normal. Pupils are equal, round, and reactive to light. Right eye exhibits no discharge. Left eye exhibits no discharge. No scleral icterus.  Neck: Normal range of motion. Neck supple. No tracheal deviation present. No thyromegaly present.  Cardiovascular: Normal rate, regular rhythm, normal heart sounds and intact distal pulses.  Exam reveals no gallop and no friction rub.   No murmur heard. Pulmonary/Chest: Effort normal and breath sounds normal. No respiratory distress. She has no wheezes. She has  no rales. She exhibits no tenderness.  Musculoskeletal: Normal range of motion. She exhibits no edema or tenderness.  Lymphadenopathy:    She has no cervical adenopathy.  Neurological: She is alert and oriented to person, place, and time. No cranial nerve deficit. She exhibits normal muscle tone. Coordination normal.  Skin: Skin is warm and dry. No rash noted. She is not diaphoretic. No erythema. No pallor.  Psychiatric: She has a normal mood and affect. Her behavior is normal. Judgment and thought content normal.          Assessment & Plan:  Over 21min of which >50%  spent in face-to-face contact with patient discussing plan of care  Problem List Items Addressed This Visit      Unprioritized   Osteoporosis - Primary    Reviewed recent bone density testing which showed osteopenia with T-score -1.6. Discussed potential treatment including adequate dietary calcium, supplemental vit D, and bisphosphonates. She prefers not to start medication. Will start Vit D supplement 2000units daily and increase physical activity. Follow up with repeat bone density in 2018.          Return in about 3 months (around 06/06/2015) for Recheck.

## 2015-04-11 ENCOUNTER — Ambulatory Visit
Admission: RE | Admit: 2015-04-11 | Discharge: 2015-04-11 | Disposition: A | Discharge status: Home / Self Care | Payer: PPO | Source: Ambulatory Visit | Attending: Family Medicine | Admitting: Family Medicine

## 2015-04-11 ENCOUNTER — Encounter: Payer: Self-pay | Admitting: Internal Medicine

## 2015-04-11 ENCOUNTER — Encounter: Payer: Self-pay | Admitting: Family Medicine

## 2015-04-11 ENCOUNTER — Ambulatory Visit (INDEPENDENT_AMBULATORY_CARE_PROVIDER_SITE_OTHER): Payer: PPO | Admitting: Family Medicine

## 2015-04-11 ENCOUNTER — Telehealth: Payer: Self-pay | Admitting: Internal Medicine

## 2015-04-11 VITALS — BP 124/68 | HR 62 | Temp 98.4°F | Resp 14 | Ht 64.0 in | Wt 183.0 lb

## 2015-04-11 DIAGNOSIS — H539 Unspecified visual disturbance: Secondary | ICD-10-CM | POA: Diagnosis not present

## 2015-04-11 DIAGNOSIS — R51 Headache: Secondary | ICD-10-CM | POA: Diagnosis not present

## 2015-04-11 DIAGNOSIS — R519 Headache, unspecified: Secondary | ICD-10-CM

## 2015-04-11 DIAGNOSIS — H43399 Other vitreous opacities, unspecified eye: Secondary | ICD-10-CM

## 2015-04-11 MED ORDER — DIAZEPAM 5 MG PO TABS: 5.0000 mg | ORAL_TABLET | Freq: Once | ORAL | Status: DC

## 2015-04-11 NOTE — Patient Instructions (Signed)
We will call with your MRI results.  It is a 5 o'clock today.  Take care  Dr. Lacinda Axon

## 2015-04-11 NOTE — Progress Notes (Signed)
Subjective:  Patient ID: Whitney Simmons, female    DOB: [birth year]  Age: 81 y.o. MRN: [id number]  CC: Flashes, floaters, sinus pressure/headache  HPI:  81 year old female with a past medical history of hypertension, hyperlipidemia, glaucoma, CKD presents to clinic today for acute visit with the above complaints.  Patient reports that on Friday she developed flashes and floaters. They were persistent and very concerning and she called the after-hours line at her ophthalmology office. She was subsequently seen and evaluated for retinal detachment. She reports that her eye exam was normal per the ophthalmologist. His only though about the cause of her symptoms was migraine with aura.   She presents today with continued complaints of flashes, floaters, vision changes, and sinus pressure.  No associated weakness, slurred speech, numbness or tingling. She reports that she has had flashes and floaters while she's been in the office and is currently having them. No other complaints. Denies headache at this time but has sinus pressure.  No exacerbating or relieving factors.  Social Hx   Social History   Social History  . Marital Status: Married    Spouse Name: N/A  . Number of Children: N/A  . Years of Education: N/A   Social History Main Topics  . Smoking status: Never Smoker   . Smokeless tobacco: Never Used  . Alcohol Use: No  . Drug Use: No  . Sexual Activity: Not on file   Other Topics Concern  . Not on file   Social History Narrative   Lives in [place] at [place]. From [place]. Has daughter.      Work- Retired Pharmacist, hospital   Review of Systems  Eyes: Positive for visual disturbance.  Neurological: Negative.    Objective:  BP 124/68 mmHg  Pulse 62  Temp(Src) 98.4 F (36.9 C)  Resp 14  Ht 5\' 4"  (1.626 m)  Wt 183 lb (83.008 kg)  BMI 31.40 kg/m2  SpO2 97%  BP/Weight 04/11/2015 03/07/2015 12/11/4313  Systolic BP 400 867 619  Diastolic BP 68 64 70  Wt. (Lbs) 183  184.25 182  BMI 31.4 31.61 30.29   Physical Exam  Constitutional: She is oriented to person, place, and time. She appears well-developed and well-nourished. No distress.  Eyes: Conjunctivae and EOM are normal. Pupils are equal, round, and reactive to light.  Neck: Neck supple. No thyromegaly present.  Cardiovascular: Normal rate and regular rhythm.   Pulmonary/Chest: Effort normal and breath sounds normal. No respiratory distress. She has no wheezes. She has no rales.  Lymphadenopathy:    She has no cervical adenopathy.  Neurological: She is alert and oriented to person, place, and time. No cranial nerve deficit. She exhibits normal muscle tone.  Normal strength in all extremities.  No focal deficits.   Psychiatric: She has a normal mood and affect.  Vitals reviewed.   Assessment & Plan:   Problem List Items Addressed This Visit    Vision changes - Primary    Exam reassuring today. Migraine with aura unlikely given reported symptoms and the fact that she does not have a history of migraine. It would be very unlikely for her to have new onset migraine with aura at 81 years of age. Given reported recent headache, vision changes/flashes/floaters and negative ophthalmological evaluation, sending for MRI to rule out mass/stroke.      Relevant Orders   MR Brain W Wo Contrast   Floaters in visual field   Relevant Orders   MR Brain W Wo Contrast  Other Visit Diagnoses    Nonintractable headache, unspecified chronicity pattern, unspecified headache type        Relevant Orders    MR Brain W Wo Contrast      Meds ordered this encounter  Medications  . diazepam (VALIUM) 5 MG tablet    Sig: Take 1 tablet (5 mg total) by mouth once.    Dispense:  30 tablet    Refill:  1    30 mins prior to MRI    Follow-up: PRN  Thersa Salt, DO

## 2015-04-11 NOTE — Assessment & Plan Note (Signed)
Exam reassuring today. Migraine with aura unlikely given reported symptoms and the fact that she does not have a history of migraine. It would be very unlikely for her to have new onset migraine with aura at 81 years of age. Given reported recent headache, vision changes/flashes/floaters and negative ophthalmological evaluation, sending for MRI to rule out mass/stroke.

## 2015-04-11 NOTE — Telephone Encounter (Signed)
Good Afternoon! I just wanted you to be aware of what the pt other issues are. I scheduled pt to see you today. She did not want to wait until tomorrow to see Dr Gilford Rile. She's nervous. Thank You!  I am in need of medication for sinus infection(my diagnosis). I had an emergency visit to eye doctor yesterday. He wasn't sure the real problem. Possible migraine but the word stroke was used. This sinus pressure is getting worse. My eyes are hurting and the symptoms from yesterday are still there. I think I need something. With my intolerance to certain antibiotics what do I do quickly. Help!

## 2015-04-11 NOTE — Progress Notes (Signed)
Pre visit review using our clinic review tool, if applicable. No additional management support is needed unless otherwise documented below in the visit note. 

## 2015-04-12 ENCOUNTER — Ambulatory Visit: Payer: PPO | Admitting: Internal Medicine

## 2015-04-12 ENCOUNTER — Other Ambulatory Visit: Payer: Self-pay | Admitting: Family Medicine

## 2015-04-12 ENCOUNTER — Encounter: Payer: Self-pay | Admitting: Internal Medicine

## 2015-04-12 ENCOUNTER — Other Ambulatory Visit: Payer: Self-pay

## 2015-04-12 MED ORDER — OXYCODONE-ACETAMINOPHEN 7.5-325 MG PO TABS: 1.0000 | ORAL_TABLET | Freq: Once | ORAL | Status: DC

## 2015-04-12 MED ORDER — DIAZEPAM 10 MG PO TABS: 10.0000 mg | ORAL_TABLET | Freq: Once | ORAL | Status: DC

## 2015-04-12 NOTE — Addendum Note (Signed)
Addended by: Coral Spikes on: 04/12/2015 03:32 PM   Modules accepted: Orders

## 2015-04-12 NOTE — Telephone Encounter (Signed)
Pt was called to tell her that were placing a order for CT scan. She asked for something to help with the pain and anxiety of small spaces.

## 2015-04-13 ENCOUNTER — Ambulatory Visit
Admission: RE | Admit: 2015-04-13 | Discharge: 2015-04-13 | Disposition: A | Discharge status: Home / Self Care | Payer: PPO | Source: Ambulatory Visit | Attending: Family Medicine | Admitting: Family Medicine

## 2015-04-13 ENCOUNTER — Telehealth: Payer: Self-pay

## 2015-04-13 ENCOUNTER — Encounter: Payer: Self-pay | Admitting: Family Medicine

## 2015-04-13 DIAGNOSIS — H539 Unspecified visual disturbance: Secondary | ICD-10-CM | POA: Diagnosis present

## 2015-04-13 DIAGNOSIS — R51 Headache: Secondary | ICD-10-CM | POA: Insufficient documentation

## 2015-04-13 DIAGNOSIS — H43399 Other vitreous opacities, unspecified eye: Secondary | ICD-10-CM

## 2015-04-13 DIAGNOSIS — R519 Headache, unspecified: Secondary | ICD-10-CM

## 2015-04-13 NOTE — Telephone Encounter (Signed)
Informed pt of CT results, pt verbalized understanding

## 2015-04-13 NOTE — Telephone Encounter (Signed)
Imaging called with a call report, printed and put on your desk.

## 2015-04-13 NOTE — Telephone Encounter (Signed)
CT head showed no acute abnormalities.

## 2015-04-13 NOTE — Telephone Encounter (Signed)
I called pt to inform her about CT results and she wanted to know what else she could about her sinus infection? She has been using the nasal spray, but it is not working well.

## 2015-04-13 NOTE — Telephone Encounter (Signed)
We can make a follow up to re-examine 36min. If no fever, chills, purulent drainage, I would recommend using Flonase as directed.

## 2015-04-16 NOTE — Telephone Encounter (Signed)
Informed pt that we could set up an appt, but if there in no fever, chills or purulent drainage she would recommend using the Flonase as directed

## 2015-04-30 ENCOUNTER — Ambulatory Visit: Payer: PPO | Admitting: Podiatry

## 2015-06-20 ENCOUNTER — Encounter: Payer: Self-pay | Admitting: Podiatry

## 2015-06-20 ENCOUNTER — Ambulatory Visit (INDEPENDENT_AMBULATORY_CARE_PROVIDER_SITE_OTHER): Payer: PPO | Admitting: Podiatry

## 2015-06-20 DIAGNOSIS — M79676 Pain in unspecified toe(s): Secondary | ICD-10-CM

## 2015-06-20 DIAGNOSIS — B351 Tinea unguium: Secondary | ICD-10-CM

## 2015-06-20 NOTE — Progress Notes (Signed)
She presents today with a chief complaint of painful elongated toenails 1 through 5 bilateral. She would like to have them cut.  Objective: Vital signs are stable she is alert and oriented 3. Pulses are strongly palpable. Neurologic sensorium is intact per Semmes-Weinstein monofilament. Deep tendon reflexes are intact. Toenails are thick yellow dystrophic lytic mycotic painful palpation no open lesions or wounds of the skin.  Assessment: pain in limb secondary to onychomycosis 1 through 5 bilateral.  Plan: Debrided nails 1 through 5 bilateral is covered service secondary to pain. Follow up with her in 3 months.

## 2015-07-03 DIAGNOSIS — M9902 Segmental and somatic dysfunction of thoracic region: Secondary | ICD-10-CM | POA: Diagnosis not present

## 2015-07-03 DIAGNOSIS — M5136 Other intervertebral disc degeneration, lumbar region: Secondary | ICD-10-CM | POA: Diagnosis not present

## 2015-07-03 DIAGNOSIS — M9903 Segmental and somatic dysfunction of lumbar region: Secondary | ICD-10-CM | POA: Diagnosis not present

## 2015-07-03 DIAGNOSIS — M62830 Muscle spasm of back: Secondary | ICD-10-CM | POA: Diagnosis not present

## 2015-07-11 ENCOUNTER — Encounter: Payer: Self-pay | Admitting: Internal Medicine

## 2015-07-11 ENCOUNTER — Other Ambulatory Visit: Payer: Self-pay | Admitting: Internal Medicine

## 2015-07-11 NOTE — Telephone Encounter (Signed)
FYI did you receive these?

## 2015-07-17 DIAGNOSIS — M5136 Other intervertebral disc degeneration, lumbar region: Secondary | ICD-10-CM | POA: Diagnosis not present

## 2015-07-17 DIAGNOSIS — H6121 Impacted cerumen, right ear: Secondary | ICD-10-CM | POA: Diagnosis not present

## 2015-07-17 DIAGNOSIS — M9903 Segmental and somatic dysfunction of lumbar region: Secondary | ICD-10-CM | POA: Diagnosis not present

## 2015-07-17 DIAGNOSIS — M9902 Segmental and somatic dysfunction of thoracic region: Secondary | ICD-10-CM | POA: Diagnosis not present

## 2015-07-17 DIAGNOSIS — M62830 Muscle spasm of back: Secondary | ICD-10-CM | POA: Diagnosis not present

## 2015-07-17 DIAGNOSIS — H903 Sensorineural hearing loss, bilateral: Secondary | ICD-10-CM | POA: Diagnosis not present

## 2015-07-24 ENCOUNTER — Ambulatory Visit (INDEPENDENT_AMBULATORY_CARE_PROVIDER_SITE_OTHER): Payer: PPO | Admitting: Family Medicine

## 2015-07-24 ENCOUNTER — Encounter: Payer: Self-pay | Admitting: Family Medicine

## 2015-07-24 VITALS — BP 122/74 | HR 64 | Temp 97.5°F | Ht 64.0 in | Wt 182.2 lb

## 2015-07-24 DIAGNOSIS — N3001 Acute cystitis with hematuria: Secondary | ICD-10-CM | POA: Diagnosis not present

## 2015-07-24 DIAGNOSIS — R300 Dysuria: Secondary | ICD-10-CM

## 2015-07-24 LAB — POCT URINALYSIS DIPSTICK
Bilirubin, UA: NEGATIVE
Glucose, UA: NEGATIVE
Ketones, UA: NEGATIVE
Nitrite, UA: NEGATIVE
Protein, UA: NEGATIVE
Spec Grav, UA: 1.02
Urobilinogen, UA: 0.2
pH, UA: 6

## 2015-07-24 LAB — URINALYSIS, MICROSCOPIC ONLY

## 2015-07-24 MED ORDER — CEPHALEXIN 500 MG PO CAPS: 500.0000 mg | ORAL_CAPSULE | Freq: Two times a day (BID) | ORAL | Status: DC

## 2015-07-24 NOTE — Progress Notes (Signed)
Patient ID: Whitney Simmons, female   DOB: 05/30/1928, 82 y.o.   MRN: [id number]  Tommi Rumps, MD Phone: [phone]  Whitney Simmons is a 82 y.o. female who presents today for same-day visit.  Patient reports 2-3 days of dysuria, urinary frequency, urinary urgency, and suprapubic pressure sensation. Notes symptoms have progressively gotten worse. She denies vaginal symptoms. No abdominal pain or fevers. She has history of UTI in the past that is been over a year since her last UTI. She's not taking any medications for this so far.   PMH: nonsmoker.   ROS see history of present illness  Objective  Physical Exam Filed Vitals:   07/24/15 1044  BP: 122/74  Pulse: 64  Temp: 97.5 F (36.4 C)    Physical Exam  Constitutional: She is well-developed, well-nourished, and in no distress.  HENT:  Head: Normocephalic and atraumatic.  Cardiovascular: Normal rate, regular rhythm and normal heart sounds.  Exam reveals no gallop and no friction rub.   No murmur heard. Pulmonary/Chest: Effort normal and breath sounds normal. No respiratory distress. She has no wheezes. She has no rales.  Abdominal: Soft. Bowel sounds are normal. She exhibits no distension. There is no tenderness. There is no rebound and no guarding.  Neurological: She is alert. Gait normal.  Skin: Skin is warm and dry. She is not diaphoretic.     Assessment/Plan: Please see individual problem list.  Acute cystitis with hematuria Symptoms and UA consistent with UTI. Benign abdominal exam. Vitals stable. Creatinine clearance is 42.8 making her Keflex dosing 500 mg twice a day for 7 days. She is given return precautions.    Orders Placed This Encounter  Procedures  . Urine Culture  . Urine Microscopic Only  . POCT Urinalysis Dipstick    Meds ordered this encounter  Medications  . cephALEXin (KEFLEX) 500 MG capsule    Sig: Take 1 capsule (500 mg total) by mouth 2 (two) times daily.    Dispense:  14 capsule   Refill:  0    Tommi Rumps

## 2015-07-24 NOTE — Assessment & Plan Note (Addendum)
Symptoms and UA consistent with UTI. Benign abdominal exam. Vitals stable. Creatinine clearance is 42.8 making her Keflex dosing 500 mg twice a day for 7 days. She is given return precautions.

## 2015-07-24 NOTE — Patient Instructions (Addendum)
Nice to meet you. You have a UTI. We will treat this with Keflex. If you develop abdominal pain, fevers, nausea, vaginal symptoms, or any new change in symptoms please seek medical attention.

## 2015-07-24 NOTE — Progress Notes (Signed)
Pre visit review using our clinic review tool, if applicable. No additional management support is needed unless otherwise documented below in the visit note. 

## 2015-07-26 ENCOUNTER — Encounter: Payer: Self-pay | Admitting: Internal Medicine

## 2015-07-26 ENCOUNTER — Ambulatory Visit (INDEPENDENT_AMBULATORY_CARE_PROVIDER_SITE_OTHER): Payer: PPO | Admitting: Internal Medicine

## 2015-07-26 VITALS — BP 138/74 | HR 61 | Temp 98.3°F | Ht 64.0 in | Wt 183.5 lb

## 2015-07-26 DIAGNOSIS — F419 Anxiety disorder, unspecified: Secondary | ICD-10-CM | POA: Diagnosis not present

## 2015-07-26 DIAGNOSIS — I10 Essential (primary) hypertension: Secondary | ICD-10-CM

## 2015-07-26 DIAGNOSIS — N183 Chronic kidney disease, stage 3 unspecified: Secondary | ICD-10-CM

## 2015-07-26 DIAGNOSIS — E039 Hypothyroidism, unspecified: Secondary | ICD-10-CM

## 2015-07-26 LAB — COMPREHENSIVE METABOLIC PANEL
ALT: 10 U/L (ref 0–35)
AST: 22 U/L (ref 0–37)
Albumin: 3.7 g/dL (ref 3.5–5.2)
Alkaline Phosphatase: 78 U/L (ref 39–117)
BUN: 17 mg/dL (ref 6–23)
CO2: 19 mEq/L (ref 19–32)
Calcium: 9.2 mg/dL (ref 8.4–10.5)
Chloride: 107 mEq/L (ref 96–112)
Creatinine, Ser: 1.07 mg/dL (ref 0.40–1.20)
GFR: 51.72 mL/min — ABNORMAL LOW (ref >60.00)
Glucose, Bld: 97 mg/dL (ref 70–99)
Potassium: 4.6 mEq/L (ref 3.5–5.1)
Sodium: 139 mEq/L (ref 135–145)
Total Bilirubin: 0.5 mg/dL (ref 0.2–1.2)
Total Protein: 6.8 g/dL (ref 6.0–8.3)

## 2015-07-26 LAB — TSH: TSH: 2.13 u[IU]/mL (ref 0.35–4.50)

## 2015-07-26 NOTE — Progress Notes (Signed)
Subjective:    Patient ID: Whitney Simmons, female    DOB: 11-Jan-1928, 82 y.o.   MRN: [id number]  HPI  82YO female presents for follow up.  Recently treated for UTI by Dr. Caryl Bis with Keflex. No issues with medication. Continues to have some burning with urination, however improved compared to earlier this week. No fever, chills, flank pain. Mild urgency and frequency. No gross hematuria noted.  Aside from this, feeling well. Symptoms of anxiety have been well controlled. Looking forward to her daughter coming to visit in April.  HTN - Compliant with medication. No CP, HA.  Wt Readings from Last 3 Encounters:  07/26/15 183 lb 8 oz (83.235 kg)  07/24/15 182 lb 3.2 oz (82.645 kg)  04/11/15 183 lb (83.008 kg)   BP Readings from Last 3 Encounters:  07/26/15 138/74  07/24/15 122/74  04/11/15 124/68    Past Medical History  Diagnosis Date  . Irregular heart beat   . Thyroid disease     s/p radioactive iodine ablation 30 years ago  . Hypertension   . Hyperlipidemia   . Edema   . IBS (irritable bowel syndrome)   . 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 Declined postoperative radiation and antiestrogen therapy.  . Breast cancer ([place]) 2015    LT LUMPECTOMY   Family History  Problem Relation Age of Onset  . Heart disease Mother   . Heart disease Maternal Aunt    Past Surgical History  Procedure Laterality Date  . Cholecystectomy    . Cataract extraction    . Toe amputation      left 5th toe  . Breast surgery Left 02-15-14    wide excision   Social History   Social History  . Marital Status: Married    Spouse Name: N/A  . Number of Children: N/A  . Years of Education: N/A   Social History Main Topics  . Smoking status: Never Smoker   . Smokeless tobacco: Never Used  . Alcohol Use: No  . Drug Use: No  . Sexual Activity: Not Asked   Other Topics Concern  . None   Social History Narrative   Lives in [place]  at [place]. From [place]. Has daughter.      Work- Retired Pharmacist, hospital    Review of Systems  Constitutional: Negative for fever, chills, appetite change, fatigue and unexpected weight change.  Eyes: Negative for visual disturbance.  Respiratory: Negative for cough and shortness of breath.   Cardiovascular: Negative for chest pain and leg swelling.  Gastrointestinal: Negative for nausea, vomiting, abdominal pain, diarrhea and constipation.  Genitourinary: Positive for dysuria, urgency and frequency. Negative for flank pain, vaginal pain and pelvic pain.  Musculoskeletal: Negative for myalgias and arthralgias.  Skin: Negative for color change and rash.  Hematological: Negative for adenopathy. Does not bruise/bleed easily.  Psychiatric/Behavioral: Negative for sleep disturbance and dysphoric mood. The patient is not nervous/anxious.        Objective:    BP 138/74 mmHg  Pulse 61  Temp(Src) 98.3 F (36.8 C) (Oral)  Ht 5\' 4"  (1.626 m)  Wt 183 lb 8 oz (83.235 kg)  BMI 31.48 kg/m2  SpO2 96% Physical Exam  Constitutional: She is oriented to person, place, and time. She appears well-developed and well-nourished. No distress.  HENT:  Head: Normocephalic and atraumatic.  Right Ear: External ear normal.  Left Ear: External ear normal.  Nose: Nose normal.  Mouth/Throat: Oropharynx  is clear and moist. No oropharyngeal exudate.  Eyes: Conjunctivae are normal. Pupils are equal, round, and reactive to light. Right eye exhibits no discharge. Left eye exhibits no discharge. No scleral icterus.  Neck: Normal range of motion. Neck supple. No tracheal deviation present. No thyromegaly present.  Cardiovascular: Normal rate, regular rhythm, normal heart sounds and intact distal pulses.  Exam reveals no gallop and no friction rub.   No murmur heard. Pulmonary/Chest: Effort normal and breath sounds normal. No respiratory distress. She has no wheezes. She has no rales. She exhibits no tenderness.    Musculoskeletal: Normal range of motion. She exhibits no edema or tenderness.  Lymphadenopathy:    She has no cervical adenopathy.  Neurological: She is alert and oriented to person, place, and time. No cranial nerve deficit. She exhibits normal muscle tone. Coordination normal.  Skin: Skin is warm and dry. No rash noted. She is not diaphoretic. No erythema. No pallor.  Psychiatric: She has a normal mood and affect. Her behavior is normal. Judgment and thought content normal.          Assessment & Plan:   Problem List Items Addressed This Visit      Unprioritized   Anxiety (Chronic)    Symptoms currently well controlled without medication. Follow up prn.      Chronic kidney disease, stage III (moderate) - Primary (Chronic)    Renal function with labs.      Relevant Orders   Comprehensive metabolic panel   Hypertension (Chronic)    BP Readings from Last 3 Encounters:  07/26/15 138/74  07/24/15 122/74  04/11/15 124/68   BP well controlled. Renal function with labs. Continue current medication.      Hypothyroidism    Check TSH with labs. Continue Levothyroxine.      Relevant Orders   TSH       Return in about 3 months (around 10/24/2015) for Recheck.

## 2015-07-26 NOTE — Assessment & Plan Note (Signed)
Check TSH with labs. Continue Levothyroxine. 

## 2015-07-26 NOTE — Assessment & Plan Note (Signed)
Symptoms currently well controlled without medication. Follow up prn.

## 2015-07-26 NOTE — Assessment & Plan Note (Signed)
BP Readings from Last 3 Encounters:  07/26/15 138/74  07/24/15 122/74  04/11/15 124/68   BP well controlled. Renal function with labs. Continue current medication.

## 2015-07-26 NOTE — Progress Notes (Signed)
Pre visit review using our clinic review tool, if applicable. No additional management support is needed unless otherwise documented below in the visit note. 

## 2015-07-26 NOTE — Patient Instructions (Signed)
Start taking Vit D 2000units daily.   Labs today.  Follow up in 3 months or sooner as needed.

## 2015-07-26 NOTE — Assessment & Plan Note (Signed)
Renal function with labs. 

## 2015-07-29 LAB — URINE CULTURE: Colony Count: >=100000

## 2015-07-30 ENCOUNTER — Other Ambulatory Visit: Payer: Self-pay | Admitting: Family Medicine

## 2015-07-30 ENCOUNTER — Encounter: Payer: Self-pay | Admitting: Internal Medicine

## 2015-07-30 MED ORDER — AMOXICILLIN 500 MG PO CAPS
500.0000 mg | ORAL_CAPSULE | Freq: Three times a day (TID) | ORAL | Status: DC
Start: 2015-07-30 — End: 2015-10-05

## 2015-07-30 MED ORDER — FLUCONAZOLE 150 MG PO TABS: 150.0000 mg | ORAL_TABLET | ORAL | Status: DC

## 2015-08-21 ENCOUNTER — Other Ambulatory Visit: Payer: Self-pay | Admitting: *Deleted

## 2015-08-21 ENCOUNTER — Encounter: Payer: Self-pay | Admitting: Internal Medicine

## 2015-08-21 MED ORDER — FUROSEMIDE 20 MG PO TABS: 20.0000 mg | ORAL_TABLET | Freq: Every day | ORAL | Status: DC

## 2015-08-27 ENCOUNTER — Encounter: Payer: Self-pay | Admitting: Internal Medicine

## 2015-08-28 ENCOUNTER — Ambulatory Visit: Payer: PPO | Admitting: Internal Medicine

## 2015-08-28 ENCOUNTER — Encounter: Payer: Self-pay | Admitting: Internal Medicine

## 2015-08-29 ENCOUNTER — Ambulatory Visit (INDEPENDENT_AMBULATORY_CARE_PROVIDER_SITE_OTHER): Payer: PPO | Admitting: Internal Medicine

## 2015-08-29 ENCOUNTER — Encounter: Payer: Self-pay | Admitting: Internal Medicine

## 2015-08-29 ENCOUNTER — Ambulatory Visit: Payer: PPO | Admitting: Internal Medicine

## 2015-08-29 VITALS — BP 128/65 | HR 75 | Temp 98.3°F | Wt 187.4 lb

## 2015-08-29 DIAGNOSIS — R609 Edema, unspecified: Secondary | ICD-10-CM | POA: Diagnosis not present

## 2015-08-29 DIAGNOSIS — R0600 Dyspnea, unspecified: Secondary | ICD-10-CM | POA: Insufficient documentation

## 2015-08-29 LAB — COMPREHENSIVE METABOLIC PANEL
ALT: 10 U/L (ref 0–35)
AST: 15 U/L (ref 0–37)
Albumin: 3.7 g/dL (ref 3.5–5.2)
Alkaline Phosphatase: 78 U/L (ref 39–117)
BUN: 16 mg/dL (ref 6–23)
CO2: 28 mEq/L (ref 19–32)
Calcium: 9 mg/dL (ref 8.4–10.5)
Chloride: 103 mEq/L (ref 96–112)
Creatinine, Ser: 1.04 mg/dL (ref 0.40–1.20)
GFR: 53.44 mL/min — ABNORMAL LOW (ref >60.00)
Glucose, Bld: 167 mg/dL — ABNORMAL HIGH (ref 70–99)
Potassium: 4.1 mEq/L (ref 3.5–5.1)
Sodium: 139 mEq/L (ref 135–145)
Total Bilirubin: 0.5 mg/dL (ref 0.2–1.2)
Total Protein: 7 g/dL (ref 6.0–8.3)

## 2015-08-29 NOTE — Assessment & Plan Note (Signed)
Scattered rales bilateral bases on lung exam. Will get cardiac ECHO. Increase furosemide to 40mg  daily in the morning. BNP and CMP with labs today.

## 2015-08-29 NOTE — Progress Notes (Signed)
Subjective:    Patient ID: Whitney Simmons, female    DOB: [birth year], 82 y.o.   MRN: [id number]  HPI  82YO female presents for acute visit.  Worried about fluid retention. Weight has been up every day. Worried this was caused by antibiotics. Feels more short of breath. Legs swollen. Hands swollen. No orthopnea. Limiting salt intake. Taking Furosemide once daily. No change in urination. Worried because family history of heart failure. Notes anxiety about this.  Wt Readings from Last 3 Encounters:  08/29/15 187 lb 6 oz (84.993 kg)  07/26/15 183 lb 8 oz (83.235 kg)  07/24/15 182 lb 3.2 oz (82.645 kg)   BP Readings from Last 3 Encounters:  08/29/15 128/65  07/26/15 138/74  07/24/15 122/74    Past Medical History  Diagnosis Date  . Irregular heart beat   . Thyroid disease     s/p radioactive iodine ablation 30 years ago  . Hypertension   . Hyperlipidemia   . Edema   . IBS (irritable bowel syndrome)   . 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 Declined postoperative radiation and antiestrogen therapy.  . Breast cancer ([facility]) 2015    LT LUMPECTOMY   Family History  Problem Relation Age of Onset  . Heart disease Mother   . Heart disease Maternal Aunt    Past Surgical History  Procedure Laterality Date  . Cholecystectomy    . Cataract extraction    . Toe amputation      left 5th toe  . Breast surgery Left 02-15-14    wide excision   Social History   Social History  . Marital Status: Married    Spouse Name: N/A  . Number of Children: N/A  . Years of Education: N/A   Social History Main Topics  . Smoking status: Never Smoker   . Smokeless tobacco: Never Used  . Alcohol Use: No  . Drug Use: No  . Sexual Activity: Not Asked   Other Topics Concern  . None   Social History Narrative   Lives in [place] at [place]. From [place]. Has daughter.      Work- Retired Pharmacist, hospital    Review of Systems  Constitutional:  Positive for fatigue. Negative for fever, chills, appetite change and unexpected weight change.  Eyes: Negative for visual disturbance.  Respiratory: Positive for shortness of breath.   Cardiovascular: Positive for leg swelling. Negative for chest pain.  Gastrointestinal: Negative for abdominal pain, diarrhea and constipation.  Skin: Negative for color change and rash.  Hematological: Negative for adenopathy. Does not bruise/bleed easily.  Psychiatric/Behavioral: Negative for dysphoric mood. The patient is not nervous/anxious.        Objective:    BP 128/65 mmHg  Pulse 75  Temp(Src) 98.3 F (36.8 C) (Oral)  Wt 187 lb 6 oz (84.993 kg)  SpO2 97% Physical Exam  Constitutional: She is oriented to person, place, and time. She appears well-developed and well-nourished. No distress.  HENT:  Head: Normocephalic and atraumatic.  Right Ear: External ear normal.  Left Ear: External ear normal.  Nose: Nose normal.  Mouth/Throat: Oropharynx is clear and moist. No oropharyngeal exudate.  Eyes: Conjunctivae are normal. Pupils are equal, round, and reactive to light. Right eye exhibits no discharge. Left eye exhibits no discharge. No scleral icterus.  Neck: Normal range of motion. Neck supple. No tracheal deviation present. No thyromegaly present.  Cardiovascular: Normal rate, regular rhythm, normal heart sounds and intact  distal pulses.  Exam reveals no gallop and no friction rub.   No murmur heard. Pulmonary/Chest: Effort normal. No accessory muscle usage. No tachypnea. No respiratory distress. She has no decreased breath sounds. She has no wheezes. She has no rhonchi. She has rales (scattered). She exhibits no tenderness.  Musculoskeletal: Normal range of motion. She exhibits no edema (trace pitting to mid shin bilaterally) or tenderness.  Lymphadenopathy:    She has no cervical adenopathy.  Neurological: She is alert and oriented to person, place, and time. No cranial nerve deficit. She  exhibits normal muscle tone. Coordination normal.  Skin: Skin is warm and dry. No rash noted. She is not diaphoretic. No erythema. No pallor.  Psychiatric: She has a normal mood and affect. Her behavior is normal. Judgment and thought content normal.          Assessment & Plan:   Problem List Items Addressed This Visit      Unprioritized   Dyspnea - Primary    Scattered rales bilateral bases on lung exam. Will get cardiac ECHO. Increase furosemide to 40mg  daily in the morning. BNP and CMP with labs today.      Relevant Orders   B Nat Peptide   Comprehensive metabolic panel   ECHOCARDIOGRAM COMPLETE   Edema    Worsening edema BLE. Will set up ECHO. Check BNP and CMP with labs. Increase Furosemide to 40mg  po qam. Follow up Monday.          Return in about 5 days (around 09/03/2015) for Recheck.  Ronette Deter, MD [facility]

## 2015-08-29 NOTE — Progress Notes (Signed)
Pre visit review using our clinic review tool, if applicable. No additional management support is needed unless otherwise documented below in the visit note. 

## 2015-08-29 NOTE — Assessment & Plan Note (Signed)
Worsening edema BLE. Will set up ECHO. Check BNP and CMP with labs. Increase Furosemide to 40mg  po qam. Follow up Monday.

## 2015-08-29 NOTE — Patient Instructions (Signed)
Increase Furosemide to 40mg  daily in the morning.  Labs today.  We will set up an ECHO of your heart.  Follow up next week.

## 2015-08-30 ENCOUNTER — Encounter: Payer: Self-pay | Admitting: Internal Medicine

## 2015-08-30 LAB — BRAIN NATRIURETIC PEPTIDE: Pro B Natriuretic peptide (BNP): 60 pg/mL (ref 0.0–100.0)

## 2015-08-31 ENCOUNTER — Other Ambulatory Visit: Payer: Self-pay | Admitting: Internal Medicine

## 2015-08-31 ENCOUNTER — Telehealth: Payer: Self-pay | Admitting: *Deleted

## 2015-09-03 ENCOUNTER — Encounter: Payer: Self-pay | Admitting: Internal Medicine

## 2015-09-03 ENCOUNTER — Ambulatory Visit (INDEPENDENT_AMBULATORY_CARE_PROVIDER_SITE_OTHER): Payer: PPO | Admitting: Internal Medicine

## 2015-09-03 VITALS — BP 110/64 | HR 63 | Temp 98.1°F | Ht 64.0 in | Wt 187.1 lb

## 2015-09-03 DIAGNOSIS — R0600 Dyspnea, unspecified: Secondary | ICD-10-CM | POA: Diagnosis not present

## 2015-09-03 LAB — BASIC METABOLIC PANEL
BUN: 18 mg/dL (ref 6–23)
CO2: 26 mEq/L (ref 19–32)
Calcium: 9.3 mg/dL (ref 8.4–10.5)
Chloride: 103 mEq/L (ref 96–112)
Creatinine, Ser: 1.09 mg/dL (ref 0.40–1.20)
GFR: 50.62 mL/min — ABNORMAL LOW (ref >60.00)
Glucose, Bld: 103 mg/dL — ABNORMAL HIGH (ref 70–99)
Potassium: 4 mEq/L (ref 3.5–5.1)
Sodium: 137 mEq/L (ref 135–145)

## 2015-09-03 MED ORDER — FUROSEMIDE 40 MG PO TABS: 40.0000 mg | ORAL_TABLET | Freq: Every day | ORAL | Status: DC

## 2015-09-03 NOTE — Progress Notes (Signed)
Subjective:    Patient ID: Whitney Simmons, female    DOB: [birth year], 82 y.o.   MRN: [id number]  HPI  82YO female presents for follow up.  Seen 3/1 for dyspnea.  Feeling better. Less swelling in legs.  No dyspnea. Taking Furosemide 40mg  daily in the morning and would like to continue on this dose.   Wt Readings from Last 3 Encounters:  09/03/15 187 lb 2 oz (84.879 kg)  08/29/15 187 lb 6 oz (84.993 kg)  07/26/15 183 lb 8 oz (83.235 kg)   BP Readings from Last 3 Encounters:  09/03/15 110/64  08/29/15 128/65  07/26/15 138/74    Past Medical History  Diagnosis Date  . Irregular heart beat   . Thyroid disease     s/p radioactive iodine ablation 30 years ago  . Hypertension   . Hyperlipidemia   . Edema   . IBS (irritable bowel syndrome)   . 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 Declined postoperative radiation and antiestrogen therapy.  . Breast cancer ([facility]) 2015    LT LUMPECTOMY   Family History  Problem Relation Age of Onset  . Heart disease Mother   . Heart disease Maternal Aunt    Past Surgical History  Procedure Laterality Date  . Cholecystectomy    . Cataract extraction    . Toe amputation      left 5th toe  . Breast surgery Left 02-15-14    wide excision   Social History   Social History  . Marital Status: Married    Spouse Name: N/A  . Number of Children: N/A  . Years of Education: N/A   Social History Main Topics  . Smoking status: Never Smoker   . Smokeless tobacco: Never Used  . Alcohol Use: No  . Drug Use: No  . Sexual Activity: Not Asked   Other Topics Concern  . None   Social History Narrative   Lives in [place] at [place]. From [place]. Has daughter.      Work- Retired Pharmacist, hospital    Review of Systems  Constitutional: Negative for fever, chills, appetite change, fatigue and unexpected weight change.  Eyes: Negative for visual disturbance.  Respiratory: Negative for cough,  shortness of breath and wheezing.   Cardiovascular: Negative for chest pain and leg swelling.  Gastrointestinal: Negative for abdominal pain.  Skin: Negative for color change and rash.  Hematological: Negative for adenopathy. Does not bruise/bleed easily.  Psychiatric/Behavioral: Negative for sleep disturbance and dysphoric mood. The patient is not nervous/anxious.        Objective:    BP 110/64 mmHg  Pulse 63  Temp(Src) 98.1 F (36.7 C) (Oral)  Ht 5\' 4"  (1.626 m)  Wt 187 lb 2 oz (84.879 kg)  BMI 32.10 kg/m2  SpO2 95% Physical Exam  Constitutional: She is oriented to person, place, and time. She appears well-developed and well-nourished. No distress.  HENT:  Head: Normocephalic and atraumatic.  Right Ear: External ear normal.  Left Ear: External ear normal.  Nose: Nose normal.  Mouth/Throat: Oropharynx is clear and moist. No oropharyngeal exudate.  Eyes: Conjunctivae are normal. Pupils are equal, round, and reactive to light. Right eye exhibits no discharge. Left eye exhibits no discharge. No scleral icterus.  Neck: Normal range of motion. Neck supple. No tracheal deviation present. No thyromegaly present.  Cardiovascular: Normal rate, regular rhythm, normal heart sounds and intact distal pulses.  Exam reveals no gallop and no  friction rub.   No murmur heard. Pulmonary/Chest: Effort normal and breath sounds normal. No respiratory distress. She has no wheezes. She has no rales. She exhibits no tenderness.  Musculoskeletal: Normal range of motion. She exhibits edema (trace bilateral LE). She exhibits no tenderness.  Lymphadenopathy:    She has no cervical adenopathy.  Neurological: She is alert and oriented to person, place, and time. No cranial nerve deficit. She exhibits normal muscle tone. Coordination normal.  Skin: Skin is warm and dry. No rash noted. She is not diaphoretic. No erythema. No pallor.  Psychiatric: She has a normal mood and affect. Her behavior is normal.  Judgment and thought content normal.          Assessment & Plan:  Over 79min of which >50% spent in face-to-face contact with patient discussing plan of care  Problem List Items Addressed This Visit      Unprioritized   Dyspnea - Primary    Dyspnea and LE edema improved with higher dose of Furosemide. Will check electrolytes with labs today. She prefers to continue on higher dose of Furosemide. Discussed risks of this medication. ECHO pending. Recheck 4 weeks and prn.      Relevant Medications   furosemide (LASIX) 40 MG tablet   Other Relevant Orders   Basic Metabolic Panel (BMET)       Return in about 4 weeks (around 10/01/2015) for Recheck.  Ronette Deter, MD Internal Medicine [facility]

## 2015-09-03 NOTE — Patient Instructions (Signed)
We will check labs today to check electrolytes.  Continue Furosemide 40mg  daily in the morning.  Follow up in 4 weeks.

## 2015-09-03 NOTE — Progress Notes (Signed)
Pre visit review using our clinic review tool, if applicable. No additional management support is needed unless otherwise documented below in the visit note. 

## 2015-09-03 NOTE — Assessment & Plan Note (Addendum)
Dyspnea and LE edema improved with higher dose of Furosemide. Will check electrolytes with labs today. She prefers to continue on higher dose of Furosemide. Discussed risks of this medication. ECHO pending. Recheck 4 weeks and prn.

## 2015-09-04 DIAGNOSIS — M9903 Segmental and somatic dysfunction of lumbar region: Secondary | ICD-10-CM | POA: Diagnosis not present

## 2015-09-04 DIAGNOSIS — M5136 Other intervertebral disc degeneration, lumbar region: Secondary | ICD-10-CM | POA: Diagnosis not present

## 2015-09-04 DIAGNOSIS — M9902 Segmental and somatic dysfunction of thoracic region: Secondary | ICD-10-CM | POA: Diagnosis not present

## 2015-09-04 DIAGNOSIS — M62830 Muscle spasm of back: Secondary | ICD-10-CM | POA: Diagnosis not present

## 2015-09-06 DIAGNOSIS — M9902 Segmental and somatic dysfunction of thoracic region: Secondary | ICD-10-CM | POA: Diagnosis not present

## 2015-09-06 DIAGNOSIS — M62830 Muscle spasm of back: Secondary | ICD-10-CM | POA: Diagnosis not present

## 2015-09-06 DIAGNOSIS — M5136 Other intervertebral disc degeneration, lumbar region: Secondary | ICD-10-CM | POA: Diagnosis not present

## 2015-09-06 DIAGNOSIS — M9903 Segmental and somatic dysfunction of lumbar region: Secondary | ICD-10-CM | POA: Diagnosis not present

## 2015-09-17 ENCOUNTER — Encounter: Payer: Self-pay | Admitting: Internal Medicine

## 2015-09-18 DIAGNOSIS — M62830 Muscle spasm of back: Secondary | ICD-10-CM | POA: Diagnosis not present

## 2015-09-18 DIAGNOSIS — M9902 Segmental and somatic dysfunction of thoracic region: Secondary | ICD-10-CM | POA: Diagnosis not present

## 2015-09-18 DIAGNOSIS — M9903 Segmental and somatic dysfunction of lumbar region: Secondary | ICD-10-CM | POA: Diagnosis not present

## 2015-09-18 DIAGNOSIS — M5136 Other intervertebral disc degeneration, lumbar region: Secondary | ICD-10-CM | POA: Diagnosis not present

## 2015-09-19 ENCOUNTER — Ambulatory Visit (INDEPENDENT_AMBULATORY_CARE_PROVIDER_SITE_OTHER): Payer: PPO | Admitting: Podiatry

## 2015-09-19 ENCOUNTER — Encounter: Payer: Self-pay | Admitting: Podiatry

## 2015-09-19 DIAGNOSIS — B351 Tinea unguium: Secondary | ICD-10-CM

## 2015-09-19 DIAGNOSIS — M79676 Pain in unspecified toe(s): Secondary | ICD-10-CM | POA: Diagnosis not present

## 2015-09-19 NOTE — Progress Notes (Signed)
She presents today with chief complaint of painful elongated toenails bilateral.  Objective: Vital signs stable alert and oriented 3 pulses are palpable. Toenails are thick yellow dystrophic mycotic and painful palpation.  Assessment: Pain in limb secondary to onychomycosis 1 through 5 bilateral.  Plan: Debridement of toenails 1 through 5 bilateral covered service secondary to pain follow up with her in 3 months.

## 2015-10-02 ENCOUNTER — Ambulatory Visit: Payer: PPO | Admitting: Internal Medicine

## 2015-10-02 DIAGNOSIS — M9903 Segmental and somatic dysfunction of lumbar region: Secondary | ICD-10-CM | POA: Diagnosis not present

## 2015-10-02 DIAGNOSIS — M62830 Muscle spasm of back: Secondary | ICD-10-CM | POA: Diagnosis not present

## 2015-10-02 DIAGNOSIS — M5136 Other intervertebral disc degeneration, lumbar region: Secondary | ICD-10-CM | POA: Diagnosis not present

## 2015-10-02 DIAGNOSIS — M9902 Segmental and somatic dysfunction of thoracic region: Secondary | ICD-10-CM | POA: Diagnosis not present

## 2015-10-05 ENCOUNTER — Ambulatory Visit (INDEPENDENT_AMBULATORY_CARE_PROVIDER_SITE_OTHER): Payer: PPO | Admitting: Internal Medicine

## 2015-10-05 ENCOUNTER — Encounter: Payer: Self-pay | Admitting: Internal Medicine

## 2015-10-05 VITALS — BP 112/68 | HR 62 | Temp 97.5°F | Ht 64.0 in | Wt 187.2 lb

## 2015-10-05 DIAGNOSIS — R0600 Dyspnea, unspecified: Secondary | ICD-10-CM

## 2015-10-05 DIAGNOSIS — N183 Chronic kidney disease, stage 3 unspecified: Secondary | ICD-10-CM

## 2015-10-05 DIAGNOSIS — N63 Unspecified lump in breast: Secondary | ICD-10-CM

## 2015-10-05 DIAGNOSIS — I10 Essential (primary) hypertension: Secondary | ICD-10-CM | POA: Diagnosis not present

## 2015-10-05 DIAGNOSIS — N6320 Unspecified lump in the left breast, unspecified quadrant: Secondary | ICD-10-CM | POA: Insufficient documentation

## 2015-10-05 DIAGNOSIS — L708 Other acne: Secondary | ICD-10-CM | POA: Diagnosis not present

## 2015-10-05 LAB — COMPREHENSIVE METABOLIC PANEL
ALT: 11 U/L (ref 0–35)
AST: 17 U/L (ref 0–37)
Albumin: 4 g/dL (ref 3.5–5.2)
Alkaline Phosphatase: 94 U/L (ref 39–117)
BUN: 19 mg/dL (ref 6–23)
CO2: 28 mEq/L (ref 19–32)
Calcium: 9.8 mg/dL (ref 8.4–10.5)
Chloride: 100 mEq/L (ref 96–112)
Creatinine, Ser: 1.14 mg/dL (ref 0.40–1.20)
GFR: 48.05 mL/min — ABNORMAL LOW (ref >60.00)
Glucose, Bld: 98 mg/dL (ref 70–99)
Potassium: 4.4 mEq/L (ref 3.5–5.1)
Sodium: 137 mEq/L (ref 135–145)
Total Bilirubin: 0.5 mg/dL (ref 0.2–1.2)
Total Protein: 7.8 g/dL (ref 6.0–8.3)

## 2015-10-05 LAB — CBC WITH DIFFERENTIAL/PLATELET
Basophils Absolute: 0.1 10*3/uL (ref 0.0–0.1)
Basophils Relative: 0.8 % (ref 0.0–3.0)
Eosinophils Absolute: 0.2 10*3/uL (ref 0.0–0.7)
Eosinophils Relative: 1.9 % (ref 0.0–5.0)
HCT: 41.4 % (ref 36.0–46.0)
Hemoglobin: 13.5 g/dL (ref 12.0–15.0)
Lymphocytes Relative: 17 % (ref 12.0–46.0)
Lymphs Abs: 2 10*3/uL (ref 0.7–4.0)
MCHC: 32.6 g/dL (ref 30.0–36.0)
MCV: 89.8 fl (ref 78.0–100.0)
Monocytes Absolute: 1 10*3/uL (ref 0.1–1.0)
Monocytes Relative: 8.6 % (ref 3.0–12.0)
Neutro Abs: 8.5 10*3/uL — ABNORMAL HIGH (ref 1.4–7.7)
Neutrophils Relative %: 71.7 % (ref 43.0–77.0)
Platelets: 97 10*3/uL — ABNORMAL LOW (ref 150.0–400.0)
RBC: 4.61 Mil/uL (ref 3.87–5.11)
RDW: 13.6 % (ref 11.5–15.5)
WBC: 11.9 10*3/uL — ABNORMAL HIGH (ref 4.0–10.5)

## 2015-10-05 LAB — TSH: TSH: 2.02 u[IU]/mL (ref 0.35–4.50)

## 2015-10-05 MED ORDER — GENTAMICIN SULFATE 0.1 % EX OINT: 1.0000 "application " | TOPICAL_OINTMENT | Freq: Three times a day (TID) | CUTANEOUS | Status: DC

## 2015-10-05 NOTE — Patient Instructions (Signed)
Start Gentamicin twice to three times daily to right axillary lesion.  We will set up mammogram for further evaluation.  Labs today.

## 2015-10-05 NOTE — Assessment & Plan Note (Signed)
BP Readings from Last 3 Encounters:  10/05/15 112/68  09/03/15 110/64  08/29/15 128/65   BP well controlled. Renal function with labs.

## 2015-10-05 NOTE — Assessment & Plan Note (Signed)
Intermittent dyspnea, likely related to anxiety. Previous imaging and BNP normal. Will recheck CBC, TSH, CMP today.

## 2015-10-05 NOTE — Assessment & Plan Note (Signed)
Left breast nodular area under lumpectomy scar. Difficult to tell if this is scar tissue or new lesion. Will get Mammogram.

## 2015-10-05 NOTE — Progress Notes (Signed)
Pre visit review using our clinic review tool, if applicable. No additional management support is needed unless otherwise documented below in the visit note. 

## 2015-10-05 NOTE — Assessment & Plan Note (Signed)
Right axillary folliculitis. Will start topical gentamicin. Follow up recheck in 4 weeks.

## 2015-10-05 NOTE — Assessment & Plan Note (Signed)
Will repeat renal function with labs.

## 2015-10-05 NOTE — Progress Notes (Signed)
Subjective:    Patient ID: Whitney Simmons, female    DOB: 1928-06-30, 82 y.o.   MRN: [id number]  HPI  82YO female presents for follow up.  Recently seen for shortness of breath. This improved with use of Furosemide. Worried about some swelling in her legs at times. Sometimes feels short of breath and very anxious. No persistent dyspnea.  Having more pain in left breast at site of lumpectomy. She was told that area would be firm and sore after lumpectomy, but is unsure how long this should have continued. She notes a nodular area under her scar. She is due for mammogram in 01/2016.  Wt Readings from Last 3 Encounters:  10/05/15 187 lb 4 oz (84.936 kg)  09/03/15 187 lb 2 oz (84.879 kg)  08/29/15 187 lb 6 oz (84.993 kg)   BP Readings from Last 3 Encounters:  10/05/15 112/68  09/03/15 110/64  08/29/15 128/65    Past Medical History  Diagnosis Date  . Irregular heart beat   . Thyroid disease     s/p radioactive iodine ablation 30 years ago  . Hypertension   . Hyperlipidemia   . Edema   . IBS (irritable bowel syndrome)   . 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 Declined postoperative radiation and antiestrogen therapy.  . Breast cancer ([facility]) 2015    LT LUMPECTOMY   Family History  Problem Relation Age of Onset  . Heart disease Mother   . Heart disease Maternal Aunt    Past Surgical History  Procedure Laterality Date  . Cholecystectomy    . Cataract extraction    . Toe amputation      left 5th toe  . Breast surgery Left 02-15-14    wide excision   Social History   Social History  . Marital Status: Married    Spouse Name: N/A  . Number of Children: N/A  . Years of Education: N/A   Social History Main Topics  . Smoking status: Never Smoker   . Smokeless tobacco: Never Used  . Alcohol Use: No  . Drug Use: No  . Sexual Activity: Not Asked   Other Topics Concern  . None   Social History Narrative   Lives in  [place] at [place]. From [place]. Has daughter.      Work- Retired Pharmacist, hospital    Review of Systems  Constitutional: Negative for fever, chills, appetite change, fatigue and unexpected weight change.  Eyes: Negative for visual disturbance.  Respiratory: Negative for cough and shortness of breath.   Cardiovascular: Positive for leg swelling. Negative for chest pain and palpitations.  Gastrointestinal: Negative for abdominal pain.  Skin: Negative for color change and rash.  Hematological: Negative for adenopathy. Does not bruise/bleed easily.  Psychiatric/Behavioral: Negative for dysphoric mood. The patient is nervous/anxious.        Objective:    BP 112/68 mmHg  Pulse 62  Temp(Src) 97.5 F (36.4 C) (Oral)  Ht 5\' 4"  (1.626 m)  Wt 187 lb 4 oz (84.936 kg)  BMI 32.13 kg/m2  SpO2 97% Physical Exam  Constitutional: She is oriented to person, place, and time. She appears well-developed and well-nourished. No distress.  HENT:  Head: Normocephalic and atraumatic.  Right Ear: External ear normal.  Left Ear: External ear normal.  Nose: Nose normal.  Mouth/Throat: Oropharynx is clear and moist. No oropharyngeal exudate.  Eyes: Conjunctivae are normal. Pupils are equal, round, and reactive to light. Right  eye exhibits no discharge. Left eye exhibits no discharge. No scleral icterus.  Neck: Normal range of motion. Neck supple. No tracheal deviation present. No thyromegaly present.  Cardiovascular: Normal rate, regular rhythm, normal heart sounds and intact distal pulses.  Exam reveals no gallop and no friction rub.   No murmur heard. Pulmonary/Chest: Effort normal and breath sounds normal. No accessory muscle usage. No respiratory distress. She has no decreased breath sounds. She has no wheezes. She has no rhonchi. She has no rales. She exhibits no tenderness. Right breast exhibits skin change and tenderness. Right breast exhibits no inverted nipple, no mass and no nipple discharge.  Left breast exhibits mass. Left breast exhibits no skin change and no tenderness. Breasts are symmetrical.    Musculoskeletal: Normal range of motion. She exhibits no edema or tenderness.  Lymphadenopathy:    She has no cervical adenopathy.  Neurological: She is alert and oriented to person, place, and time. No cranial nerve deficit. She exhibits normal muscle tone. Coordination normal.  Skin: Skin is warm and dry. No rash noted. She is not diaphoretic. No erythema. No pallor.  Psychiatric: Her speech is normal and behavior is normal. Judgment and thought content normal. Her mood appears anxious. Cognition and memory are normal.          Assessment & Plan:   Problem List Items Addressed This Visit      Unprioritized   Breast mass, left    Left breast nodular area under lumpectomy scar. Difficult to tell if this is scar tissue or new lesion. Will get Mammogram.      Relevant Orders   MM Digital Diagnostic Bilat   Chronic kidney disease, stage III (moderate) (Chronic)    Will repeat renal function with labs.      Dyspnea - Primary    Intermittent dyspnea, likely related to anxiety. Previous imaging and BNP normal. Will recheck CBC, TSH, CMP today.      Relevant Orders   Comprehensive metabolic panel   TSH   CBC w/Diff   Follicular acne    Right axillary folliculitis. Will start topical gentamicin. Follow up recheck in 4 weeks.      Relevant Medications   gentamicin ointment (GARAMYCIN) 0.1 %   Hypertension (Chronic)    BP Readings from Last 3 Encounters:  10/05/15 112/68  09/03/15 110/64  08/29/15 128/65   BP well controlled. Renal function with labs.          Return in about 4 weeks (around 11/02/2015) for Recheck.  Ronette Deter, MD Internal Medicine [facility]

## 2015-10-17 ENCOUNTER — Other Ambulatory Visit: Payer: Self-pay | Admitting: Internal Medicine

## 2015-10-17 DIAGNOSIS — H401131 Primary open-angle glaucoma, bilateral, mild stage: Secondary | ICD-10-CM | POA: Diagnosis not present

## 2015-10-17 DIAGNOSIS — R928 Other abnormal and inconclusive findings on diagnostic imaging of breast: Secondary | ICD-10-CM

## 2015-10-19 ENCOUNTER — Encounter: Payer: Self-pay | Admitting: Internal Medicine

## 2015-10-25 ENCOUNTER — Encounter: Payer: Self-pay | Admitting: Internal Medicine

## 2015-10-26 ENCOUNTER — Other Ambulatory Visit: Payer: Self-pay | Admitting: Internal Medicine

## 2015-10-26 DIAGNOSIS — I10 Essential (primary) hypertension: Secondary | ICD-10-CM

## 2015-11-06 ENCOUNTER — Ambulatory Visit (INDEPENDENT_AMBULATORY_CARE_PROVIDER_SITE_OTHER): Payer: PPO | Admitting: Internal Medicine

## 2015-11-06 ENCOUNTER — Encounter: Payer: Self-pay | Admitting: Internal Medicine

## 2015-11-06 VITALS — BP 144/72 | HR 62 | Temp 97.7°F | Resp 14 | Ht 64.0 in | Wt 187.2 lb

## 2015-11-06 DIAGNOSIS — D696 Thrombocytopenia, unspecified: Secondary | ICD-10-CM | POA: Diagnosis not present

## 2015-11-06 DIAGNOSIS — R0600 Dyspnea, unspecified: Secondary | ICD-10-CM | POA: Diagnosis not present

## 2015-11-06 DIAGNOSIS — R300 Dysuria: Secondary | ICD-10-CM

## 2015-11-06 LAB — CBC WITH DIFFERENTIAL/PLATELET
Basophils Absolute: 0 10*3/uL (ref 0.0–0.1)
Basophils Relative: 0.6 % (ref 0.0–3.0)
Eosinophils Absolute: 0.2 10*3/uL (ref 0.0–0.7)
Eosinophils Relative: 2.5 % (ref 0.0–5.0)
HCT: 36.8 % (ref 36.0–46.0)
Hemoglobin: 12.1 g/dL (ref 12.0–15.0)
Lymphocytes Relative: 15.9 % (ref 12.0–46.0)
Lymphs Abs: 1.2 10*3/uL (ref 0.7–4.0)
MCHC: 32.9 g/dL (ref 30.0–36.0)
MCV: 89.5 fl (ref 78.0–100.0)
Monocytes Absolute: 0.7 10*3/uL (ref 0.1–1.0)
Monocytes Relative: 9.4 % (ref 3.0–12.0)
Neutro Abs: 5.4 10*3/uL (ref 1.4–7.7)
Neutrophils Relative %: 71.6 % (ref 43.0–77.0)
Platelets: 141 10*3/uL — ABNORMAL LOW (ref 150.0–400.0)
RBC: 4.11 Mil/uL (ref 3.87–5.11)
RDW: 13.8 % (ref 11.5–15.5)
WBC: 7.5 10*3/uL (ref 4.0–10.5)

## 2015-11-06 LAB — POCT URINALYSIS DIPSTICK
Bilirubin, UA: NEGATIVE
Glucose, UA: NEGATIVE
Ketones, UA: NEGATIVE
Nitrite, UA: NEGATIVE
Protein, UA: NEGATIVE
Spec Grav, UA: 1.015
Urobilinogen, UA: 0.2
pH, UA: 6

## 2015-11-06 NOTE — Addendum Note (Signed)
Addended by: Leeanne Rio on: 11/06/2015 09:36 AM   Modules accepted: Orders

## 2015-11-06 NOTE — Progress Notes (Signed)
Subjective:    Patient ID: Whitney Simmons, female    DOB: Nov 24, 1927, 82 y.o.   MRN: [id number]  HPI  82YO female presents for follow up.  Continues to have dyspnea with minimal exertion such as walking 50 yards. No chest pain. Evaluation with cardiology and for cardiac ECHO is pending. She also continues to have some LEE bilaterally. Worse at night. Improved in mornings. Has not been able to wear compression hose because of difficulty getting them on.  Recent labs showed slightly low platelet count 97. No recent bleeding or bruising.  Wt Readings from Last 3 Encounters:  11/06/15 187 lb 3.2 oz (84.913 kg)  10/05/15 187 lb 4 oz (84.936 kg)  09/03/15 187 lb 2 oz (84.879 kg)   BP Readings from Last 3 Encounters:  11/06/15 144/72  10/05/15 112/68  09/03/15 110/64    Past Medical History  Diagnosis Date  . Irregular heart beat   . Thyroid disease     s/p radioactive iodine ablation 30 years ago  . Hypertension   . Hyperlipidemia   . Edema   . IBS (irritable bowel syndrome)   . 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 Declined postoperative radiation and antiestrogen therapy.  . Breast cancer ([facility]) 2015    LT LUMPECTOMY   Family History  Problem Relation Age of Onset  . Heart disease Mother   . Heart disease Maternal Aunt    Past Surgical History  Procedure Laterality Date  . Cholecystectomy    . Cataract extraction    . Toe amputation      left 5th toe  . Breast surgery Left 02-15-14    wide excision   Social History   Social History  . Marital Status: Married    Spouse Name: N/A  . Number of Children: N/A  . Years of Education: N/A   Social History Main Topics  . Smoking status: Never Smoker   . Smokeless tobacco: Never Used  . Alcohol Use: No  . Drug Use: No  . Sexual Activity: Not Asked   Other Topics Concern  . None   Social History Narrative   Lives in [place] at [place]. From [place]. Has  daughter.      Work- Retired Pharmacist, hospital    Review of Systems  Constitutional: Negative for fever, chills, appetite change, fatigue and unexpected weight change.  Eyes: Negative for visual disturbance.  Respiratory: Positive for shortness of breath. Negative for cough, chest tightness and wheezing.   Cardiovascular: Positive for leg swelling. Negative for chest pain and palpitations.  Gastrointestinal: Negative for nausea, vomiting, abdominal pain, diarrhea and constipation.  Musculoskeletal: Positive for arthralgias.  Skin: Negative for color change and rash.  Hematological: Negative for adenopathy. Does not bruise/bleed easily.  Psychiatric/Behavioral: Negative for suicidal ideas, sleep disturbance and dysphoric mood. The patient is not nervous/anxious.        Objective:    BP 144/72 mmHg  Pulse 62  Temp(Src) 97.7 F (36.5 C) (Oral)  Resp 14  Ht 5\' 4"  (1.626 m)  Wt 187 lb 3.2 oz (84.913 kg)  BMI 32.12 kg/m2  SpO2 97% Physical Exam  Constitutional: She is oriented to person, place, and time. She appears well-developed and well-nourished. No distress.  HENT:  Head: Normocephalic and atraumatic.  Right Ear: External ear normal.  Left Ear: External ear normal.  Nose: Nose normal.  Mouth/Throat: Oropharynx is clear and moist. No oropharyngeal exudate.  Eyes: Conjunctivae  are normal. Pupils are equal, round, and reactive to light. Right eye exhibits no discharge. Left eye exhibits no discharge. No scleral icterus.  Neck: Normal range of motion. Neck supple. No tracheal deviation present. No thyromegaly present.  Cardiovascular: Normal rate, regular rhythm, normal heart sounds and intact distal pulses.  Exam reveals no gallop and no friction rub.   No murmur heard. Pulmonary/Chest: Effort normal and breath sounds normal. No respiratory distress. She has no wheezes. She has no rales. She exhibits no tenderness.  Musculoskeletal: Normal range of motion. She exhibits no edema or  tenderness.  Lymphadenopathy:    She has no cervical adenopathy.  Neurological: She is alert and oriented to person, place, and time. No cranial nerve deficit. She exhibits normal muscle tone. Coordination normal.  Skin: Skin is warm and dry. No rash noted. She is not diaphoretic. No erythema. No pallor.  Psychiatric: She has a normal mood and affect. Her behavior is normal. Judgment and thought content normal.          Assessment & Plan:   Problem List Items Addressed This Visit      Unprioritized   Dyspnea    Chronic dyspnea. Exam is normal. ECHO and cardiology evaluation pending.       Thrombocytopenia ([facility]) - Primary    Recent mild thrombocytopenia. We discussed potential causes of this. Will repeat CBC with labs today.       Relevant Orders   CBC w/Diff    Other Visit Diagnoses    Dysuria        Relevant Orders    POCT Urinalysis Dipstick        Return in about 4 weeks (around 12/04/2015) for Recheck.  Ronette Deter, MD Internal Medicine [facility]

## 2015-11-06 NOTE — Patient Instructions (Signed)
Labs today.  Follow up in 4 weeks. 

## 2015-11-06 NOTE — Assessment & Plan Note (Signed)
Chronic dyspnea. Exam is normal. ECHO and cardiology evaluation pending.

## 2015-11-06 NOTE — Assessment & Plan Note (Signed)
Recent mild thrombocytopenia. We discussed potential causes of this. Will repeat CBC with labs today.

## 2015-11-08 ENCOUNTER — Encounter: Payer: Self-pay | Admitting: Internal Medicine

## 2015-11-08 DIAGNOSIS — R0602 Shortness of breath: Secondary | ICD-10-CM | POA: Diagnosis not present

## 2015-11-08 LAB — URINE CULTURE: Colony Count: 40000

## 2015-11-09 ENCOUNTER — Ambulatory Visit
Admission: RE | Admit: 2015-11-09 | Discharge: 2015-11-09 | Disposition: A | Discharge status: Home / Self Care | Payer: PPO | Source: Ambulatory Visit | Attending: Internal Medicine | Admitting: Internal Medicine

## 2015-11-09 ENCOUNTER — Ambulatory Visit: Payer: PPO | Admitting: Internal Medicine

## 2015-11-09 DIAGNOSIS — E785 Hyperlipidemia, unspecified: Secondary | ICD-10-CM | POA: Insufficient documentation

## 2015-11-09 DIAGNOSIS — I10 Essential (primary) hypertension: Secondary | ICD-10-CM | POA: Diagnosis not present

## 2015-11-09 DIAGNOSIS — Z853 Personal history of malignant neoplasm of breast: Secondary | ICD-10-CM | POA: Insufficient documentation

## 2015-11-09 DIAGNOSIS — E079 Disorder of thyroid, unspecified: Secondary | ICD-10-CM | POA: Insufficient documentation

## 2015-11-09 DIAGNOSIS — I071 Rheumatic tricuspid insufficiency: Secondary | ICD-10-CM | POA: Diagnosis not present

## 2015-11-09 DIAGNOSIS — I499 Cardiac arrhythmia, unspecified: Secondary | ICD-10-CM | POA: Insufficient documentation

## 2015-11-09 DIAGNOSIS — R0600 Dyspnea, unspecified: Secondary | ICD-10-CM | POA: Insufficient documentation

## 2015-11-09 DIAGNOSIS — K589 Irritable bowel syndrome without diarrhea: Secondary | ICD-10-CM | POA: Insufficient documentation

## 2015-11-09 NOTE — Progress Notes (Signed)
*  PRELIMINARY RESULTS* Echocardiogram 2D Echocardiogram has been performed.  Laqueta Jean Hege 11/09/2015, 11:39 AM

## 2015-11-14 ENCOUNTER — Ambulatory Visit (INDEPENDENT_AMBULATORY_CARE_PROVIDER_SITE_OTHER): Payer: PPO | Admitting: Cardiology

## 2015-11-14 ENCOUNTER — Encounter: Payer: Self-pay | Admitting: Cardiology

## 2015-11-14 VITALS — BP 130/72 | HR 59 | Ht 64.0 in | Wt 187.0 lb

## 2015-11-14 DIAGNOSIS — I10 Essential (primary) hypertension: Secondary | ICD-10-CM

## 2015-11-14 DIAGNOSIS — R600 Localized edema: Secondary | ICD-10-CM | POA: Diagnosis not present

## 2015-11-14 DIAGNOSIS — R0602 Shortness of breath: Secondary | ICD-10-CM | POA: Diagnosis not present

## 2015-11-14 DIAGNOSIS — I351 Nonrheumatic aortic (valve) insufficiency: Secondary | ICD-10-CM | POA: Diagnosis not present

## 2015-11-14 DIAGNOSIS — I499 Cardiac arrhythmia, unspecified: Secondary | ICD-10-CM

## 2015-11-14 DIAGNOSIS — R002 Palpitations: Secondary | ICD-10-CM | POA: Diagnosis not present

## 2015-11-14 NOTE — Progress Notes (Signed)
Cardiology Office Note   Date:  11/14/2015   ID:  [name], DOB Jun 04, 1928, MRN [id number]  Referring Doctor:  Rica Mast, MD   Cardiologist:   Wende Bushy, MD   Reason for consultation:  Chief Complaint  Patient presents with  . other    Ref by Dr. Gilford Rile for up of Echo results. Meds reviewed by the patient verbally.       History of Present Illness: Whitney Simmons is a 82 y.o. female who presents for Findings on echocardiogram.   Patient reports Shortness of breath. This has been going on for several months now. She has shortness of breath with minimal exertion. Walking around the house or walking from the house to the car she gets short of breath. She describes the symptoms as mainly in the chest, nonradiating, moderate in severity. Not associated with chest pain. She has noted progression over the last few months.  Patient also complains of palpitations or feeling irregularity in her heart beat. In the past, she was told that she had an irregular heartbeat. As far as she knows, it was not atrial fibrillation or atrial flutter. She was placed on atenolol and then eventually switched to by systolic. Despite being on this for years, she has noticed more episodes of palpitations. She does not know if this is related mainly to stress. Palpitations are in the center of the chest, nonradiating, mild in severity.    ROS:  Please see the history of present illness. Aside from mentioned under HPI, all other systems are reviewed and negative.     Past Medical History  Diagnosis Date  . Irregular heart beat   . Thyroid disease     s/p radioactive iodine ablation 30 years ago  . Hypertension   . Hyperlipidemia   . Edema   . IBS (irritable bowel syndrome)   . 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 Declined postoperative radiation and antiestrogen therapy.  . Breast cancer ([place]) 2015    LT LUMPECTOMY    Past Surgical  History  Procedure Laterality Date  . Cholecystectomy    . Cataract extraction    . Toe amputation      left 5th toe  . Breast surgery Left 02-15-14    wide excision     reports that she has never smoked. She has never used smokeless tobacco. She reports that she does not drink alcohol or use illicit drugs.   family history includes Heart disease in her maternal aunt and mother.   Current Outpatient Prescriptions  Medication Sig Dispense Refill  . aspirin 81 MG tablet Take 81 mg by mouth daily.    . fluticasone (FLONASE) 50 MCG/ACT nasal spray Place 2 sprays into both nostrils daily. 16 g 6  . furosemide (LASIX) 40 MG tablet Take 1 tablet (40 mg total) by mouth daily. 90 tablet 3  . gentamicin ointment (GARAMYCIN) 0.1 % Apply 1 application topically 3 (three) times daily. 15 g 0  . levothyroxine (SYNTHROID, LEVOTHROID) 100 MCG tablet TAKE ONE TABLET BY MOUTH ONCE DAILY BEFORE BREAKFAST 90 tablet 0  . losartan (COZAAR) 50 MG tablet Take 1 tablet (50 mg total) by mouth daily. 90 tablet 3  . Multiple Vitamin (MULTIVITAMIN) tablet Take 1 tablet by mouth daily.    . nebivolol (BYSTOLIC) 5 MG tablet Take 1 tablet (5 mg total) by mouth daily. 90 tablet 3  . timolol (BETIMOL) 0.5 % ophthalmic solution  Place 1 drop into both eyes daily.     No current facility-administered medications for this visit.    Allergies: Levaquin and Sulfa antibiotics    PHYSICAL EXAM: VS:  BP 130/72 mmHg  Pulse 59  Ht 5\' 4"  (1.626 m)  Wt 187 lb (84.823 kg)  BMI 32.08 kg/m2  SpO2 98% , Body mass index is 32.08 kg/(m^2). Wt Readings from Last 3 Encounters:  11/14/15 187 lb (84.823 kg)  11/06/15 187 lb 3.2 oz (84.913 kg)  10/05/15 187 lb 4 oz (84.936 kg)    GENERAL:  well developed, well nourished, obese, not in acute distress HEENT: normocephalic, pink conjunctivae, anicteric sclerae, no xanthelasma, normal dentition, oropharynx clear NECK:  no neck vein engorgement, JVP normal, no hepatojugular reflux,  carotid upstroke brisk and symmetric, no bruit, no thyromegaly, no lymphadenopathy LUNGS:  good respiratory effort, clear to auscultation bilaterally CV:  PMI not displaced, no thrills, no lifts, S1 and S2 within normal limits, no palpable S3 or S4, no murmurs, no rubs, no gallops ABD:  Soft, nontender, nondistended, normoactive bowel sounds, no abdominal aortic bruit, no hepatomegaly, no splenomegaly MS: nontender back, no kyphosis, no scoliosis, no joint deformities EXT:  2+ DP/PT pulses, 1+ edema, no varicosities, no cyanosis, no clubbing SKIN: warm, nondiaphoretic, normal turgor, no ulcers NEUROPSYCH: alert, oriented to person, place, and time, sensory/motor grossly intact, normal mood, appropriate affect  Recent Labs: 08/29/2015: Pro B Natriuretic peptide (BNP) 60.0 10/05/2015: ALT 11; BUN 19; Creatinine, Ser 1.14; Potassium 4.4; Sodium 137; TSH 2.02 11/06/2015: Hemoglobin 12.1; Platelets 141.0*   Lipid Panel    Component Value Date/Time   CHOL 197 01/23/2015 1002   TRIG 154.0* 01/23/2015 1002   HDL 38.50* 01/23/2015 1002   CHOLHDL 5 01/23/2015 1002   VLDL 30.8 01/23/2015 1002   LDLCALC 128* 01/23/2015 1002     Other studies Reviewed:  EKG:  The ekg from 11/14/2015 was personally reviewed by me and it revealed sinus bradycardia, 59 BPM. Minimal voltage criteria for LVH.  Additional studies/ records that were reviewed personally reviewed by me today include:  Echo 11/09/2015: (Dr. Ubaldo Glassing) Left ventricle: Systolic function was normal. The estimated  ejection fraction was in the range of 50% to 55%. - Aortic valve: There was mild regurgitation. Valve area (Vmax):  2.57 cm^2. PHT 521ms.  ASSESSMENT AND PLAN: Shortness of breath Lungs are clear. No evidence of neck vein engorgement. Only pedal edema. No clear evidence on physical exam of congestive heart failure. Need to rule out ischemia as cause of the shortness of breath. Patient has risk factors for CAD. Recommend  pharmacologic nuclear stress test.  Palpitations Recommend 24-hour Holter Holter monitor for objective evaluation.  Mild AI No appreciable murmur noted on physical exam. Recommend blood pressure control and serial evaluation.  Pedal edema Again as described above, may not necessarily be related to congestive heart failure. PCP managing diuretics. May try a different diuretic if patient fails to respond to higher doses of furosemide. Sodium restriction recommended as well.  Hypertension BP is well controlled. Continue monitoring BP. Continue current medical therapy and lifestyle changes.   Current medicines are reviewed at length with the patient today.  The patient does not have concerns regarding medicines.  Labs/ tests ordered today include:  Orders Placed This Encounter  Procedures  . NM Myocar Multi W/Spect W/Wall Motion / EF  . Holter monitor - 24 hour  . EKG 12-Lead    I had a lengthy and detailed discussion with the patient regarding  diagnoses, prognosis, diagnostic options, treatment options , and side effects of medications.   I counseled the patient on importance of lifestyle modification including heart healthy diet, regular physical activity   Disposition:   FU with undersigned after tests  Signed, Wende Bushy, MD  11/14/2015 10:09 AM    [place]

## 2015-11-14 NOTE — Patient Instructions (Addendum)
Medication Instructions:  Your physician recommends that you continue on your current medications as directed. Please refer to the Current Medication list given to you today.   Labwork: None ordered  Testing/Procedures: Your physician has recommended that you wear a holter monitor. Holter monitors are medical devices that record the heart's electrical activity. Doctors most often use these monitors to diagnose arrhythmias. Arrhythmias are problems with the speed or rhythm of the heartbeat. The monitor is a small, portable device. You can wear one while you do your normal daily activities. This is usually used to diagnose what is causing palpitations/syncope (passing out).  Date & Time: _____________________________________________________________________  [facility]  Your caregiver has ordered a Stress Test with nuclear imaging. The purpose of this test is to evaluate the blood supply to your heart muscle. This procedure is referred to as a "Non-Invasive Stress Test." This is because other than having an IV started in your vein, nothing is inserted or "invades" your body. Cardiac stress tests are done to find areas of poor blood flow to the heart by determining the extent of coronary artery disease (CAD). Some patients exercise on a treadmill, which naturally increases the blood flow to your heart, while others who are  unable to walk on a treadmill due to physical limitations have a pharmacologic/chemical stress agent called Lexiscan . This medicine will mimic walking on a treadmill by temporarily increasing your coronary blood flow.   Please note: these test may take anywhere between 2-4 hours to complete  PLEASE REPORT TO [facility]ICAL MALL ENTRANCE  THE VOLUNTEERS AT THE FIRST DESK WILL DIRECT YOU WHERE TO GO  Date of Procedure:___Friday Nov 23, 2015 at 08:00AM______  Arrival Time for Procedure:____Arrive at 07:45AM to register___  Instructions regarding medication:   __X__:  Hold  Bystolic (nebivolol) the night before procedure and morning of procedure  __X__:  Hold your lasix that morning as well. You may take after your procedure.    PLEASE NOTIFY THE OFFICE AT LEAST 24 HOURS IN ADVANCE IF YOU ARE UNABLE TO KEEP YOUR APPOINTMENT.  [phone] AND  PLEASE NOTIFY NUCLEAR MEDICINE AT [facility] AT LEAST 24 HOURS IN ADVANCE IF YOU ARE UNABLE TO KEEP YOUR APPOINTMENT. [phone]  How to prepare for your Myoview test:   Do not eat or drink after midnight  No caffeine for 24 hours prior to test  No smoking 24 hours prior to test.  Your medication may be taken with water.  If your doctor stopped a medication because of this test, do not take that medication.  Ladies, please do not wear dresses.  Skirts or pants are appropriate. Please wear a short sleeve shirt.  No perfume, cologne or lotion.  Wear comfortable walking shoes. No heels!    Follow-Up: Your physician recommends that you schedule a follow-up appointment after testing with Dr. Alvino Chapel  Date & Time: ____________________________________________________________   Any Other Special Instructions Will Be Listed Below (If Applicable).     If you need a refill on your cardiac medications before your next appointment, please call your pharmacy.  Holter Monitoring A Holter monitor is a small device that is used to detect abnormal heart rhythms. It clips to your clothing and is connected by wires to flat, sticky disks (electrodes) that attach to your chest. It is worn continuously for 24-48 hours. HOME CARE INSTRUCTIONS  Wear your Holter monitor at all times, even while exercising and sleeping, for as long as directed by your health care provider.  Make sure that  the Holter monitor is safely clipped to your clothing or close to your body as recommended by your health care provider.  Do not get the monitor or wires wet.  Do not put body lotion or moisturizer on your chest.  Keep your skin  clean.  Keep a diary of your daily activities, such as walking and doing chores. If you feel that your heartbeat is abnormal or that your heart is fluttering or skipping a beat:  Record what you are doing when it happens.  Record what time of day the symptoms occur.  Return your Holter monitor as directed by your health care provider.  Keep all follow-up visits as directed by your health care provider. This is important. SEEK IMMEDIATE MEDICAL CARE IF:  You feel lightheaded or you faint.  You have trouble breathing.  You feel pain in your chest, upper arm, or jaw.  You feel sick to your stomach and your skin is pale, cool, or damp.  You heartbeat feels unusual or abnormal.   This information is not intended to replace advice given to you by your health care provider. Make sure you discuss any questions you have with your health care provider.   Document Released: 03/14/2004 Document Revised: 07/07/2014 Document Reviewed: 01/23/2014 Elsevier Interactive Patient Education 2016 [employer].   Pharmacologic Stress Electrocardiogram A pharmacologic stress electrocardiogram is a heart (cardiac) test that uses nuclear imaging to evaluate the blood supply to your heart. This test may also be called a pharmacologic stress electrocardiography. Pharmacologic means that a medicine is used to increase your heart rate and blood pressure.  This stress test is done to find areas of poor blood flow to the heart by determining the extent of coronary artery disease (CAD). Some people exercise on a treadmill, which naturally increases the blood flow to the heart. For those people unable to exercise on a treadmill, a medicine is used. This medicine stimulates your heart and will cause your heart to beat harder and more quickly, as if you were exercising.  Pharmacologic stress tests can help determine:  The adequacy of blood flow to your heart during increased levels of activity in order to clear you  for discharge home.  The extent of coronary artery blockage caused by CAD.  Your prognosis if you have suffered a heart attack.  The effectiveness of cardiac procedures done, such as an angioplasty, which can increase the circulation in your coronary arteries.  Causes of chest pain or pressure. LET [facility] A CARE PROVIDER KNOW ABOUT:  Any allergies you have.  All medicines you are taking, including vitamins, herbs, eye drops, creams, and over-the-counter medicines.  Previous problems you or members of your family have had with the use of anesthetics.  Any blood disorders you have.  Previous surgeries you have had.  Medical conditions you have.  Possibility of pregnancy, if this applies.  If you are currently breastfeeding. RISKS AND COMPLICATIONS Generally, this is a safe procedure. However, as with any procedure, complications can occur. Possible complications include:  You develop pain or pressure in the following areas:  Chest.  Jaw or neck.  Between your shoulder blades.  Radiating down your left arm.  Headache.  Dizziness or light-headedness.  Shortness of breath.  Increased or irregular heartbeat.  Low blood pressure.  Nausea or vomiting.  Flushing.  Redness going up the arm and slight pain during injection of medicine.  Heart attack (rare). BEFORE THE PROCEDURE   Avoid all forms of caffeine for 24 hours  before your test or as directed by your health care provider. This includes coffee, tea (even decaffeinated tea), caffeinated sodas, chocolate, cocoa, and certain pain medicines.  Follow your health care provider's instructions regarding eating and drinking before the test.  Take your medicines as directed at regular times with water unless instructed otherwise. Exceptions may include:  If you have diabetes, ask how you are to take your insulin or pills. It is common to adjust insulin dosing the morning of the test.  If you are taking  beta-blocker medicines, it is important to talk to your health care provider about these medicines well before the date of your test. Taking beta-blocker medicines may interfere with the test. In some cases, these medicines need to be changed or stopped 24 hours or more before the test.  If you wear a nitroglycerin patch, it may need to be removed prior to the test. Ask your health care provider if the patch should be removed before the test.  If you use an inhaler for any breathing condition, bring it with you to the test.  If you are an outpatient, bring a snack so you can eat right after the stress phase of the test.  Do not smoke for 4 hours prior to the test or as directed by your health care provider.  Do not apply lotions, powders, creams, or oils on your chest prior to the test.  Wear comfortable shoes and clothing. Let your health care provider know if you were unable to complete or follow the preparations for your test. PROCEDURE   Multiple patches (electrodes) will be put on your chest. If needed, small areas of your chest may be shaved to get better contact with the electrodes. Once the electrodes are attached to your body, multiple wires will be attached to the electrodes, and your heart rate will be monitored.  An IV access will be started. A nuclear trace (isotope) is given. The isotope may be given intravenously, or it may be swallowed. Nuclear refers to several types of radioactive isotopes, and the nuclear isotope lights up the arteries so that the nuclear images are clear. The isotope is absorbed by your body. This results in low radiation exposure.  A resting nuclear image is taken to show how your heart functions at rest.  A medicine is given through the IV access.  A second scan is done about 1 hour after the medicine injection and determines how your heart functions under stress.  During this stress phase, you will be connected to an electrocardiogram machine. Your  blood pressure and oxygen levels will be monitored. AFTER THE PROCEDURE   Your heart rate and blood pressure will be monitored after the test.  You may return to your normal schedule, including diet,activities, and medicines, unless your health care provider tells you otherwise.   This information is not intended to replace advice given to you by your health care provider. Make sure you discuss any questions you have with your health care provider.   Document Released: 11/02/2008 Document Revised: 06/21/2013 Document Reviewed: 02/21/2013 Elsevier Interactive Patient Education [employer].

## 2015-11-15 ENCOUNTER — Telehealth: Payer: Self-pay

## 2015-11-15 NOTE — Telephone Encounter (Signed)
Patient notified [facility] will place the holter monitor. The patient is aware of instructions of where she needs to go for the monitor placement.

## 2015-11-19 ENCOUNTER — Telehealth: Payer: Self-pay | Admitting: Cardiology

## 2015-11-19 ENCOUNTER — Ambulatory Visit (INDEPENDENT_AMBULATORY_CARE_PROVIDER_SITE_OTHER): Payer: PPO

## 2015-11-19 DIAGNOSIS — R0602 Shortness of breath: Secondary | ICD-10-CM

## 2015-11-19 DIAGNOSIS — R002 Palpitations: Secondary | ICD-10-CM | POA: Diagnosis not present

## 2015-11-19 DIAGNOSIS — R001 Bradycardia, unspecified: Secondary | ICD-10-CM | POA: Diagnosis not present

## 2015-11-19 NOTE — Telephone Encounter (Signed)
S/w pt who states she cancelled Friday stress test and reports "feeling much better". Had 24 hour monitor placed today and asks if she needs f/u appt w/Dr. Yvone Neu at this time. Informed pt that Dr. Yvone Neu advised f/u after tests. Pt agreeable to wait until HM results before deciding if she wants to f/u w/cardiology. She verbalized understanding with no further questions.

## 2015-11-19 NOTE — Telephone Encounter (Signed)
Pt would like to cancel her stress test for Friday 5/26(she states it has already been cancelled). However, she did have her monitor placed this morning, and states at this time, is "doing much better". Please call and advise if pt still needs to f/u with Dr. Yvone Neu.

## 2015-11-20 DIAGNOSIS — M5136 Other intervertebral disc degeneration, lumbar region: Secondary | ICD-10-CM | POA: Diagnosis not present

## 2015-11-20 DIAGNOSIS — M9903 Segmental and somatic dysfunction of lumbar region: Secondary | ICD-10-CM | POA: Diagnosis not present

## 2015-11-20 DIAGNOSIS — M9902 Segmental and somatic dysfunction of thoracic region: Secondary | ICD-10-CM | POA: Diagnosis not present

## 2015-11-20 DIAGNOSIS — M62830 Muscle spasm of back: Secondary | ICD-10-CM | POA: Diagnosis not present

## 2015-11-23 ENCOUNTER — Other Ambulatory Visit: Payer: PPO

## 2015-11-28 ENCOUNTER — Ambulatory Visit: Payer: PPO | Admitting: Cardiology

## 2015-11-28 ENCOUNTER — Ambulatory Visit: Payer: PPO | Admitting: Podiatry

## 2015-11-29 ENCOUNTER — Ambulatory Visit: Payer: PPO | Admitting: Cardiology

## 2015-12-05 ENCOUNTER — Encounter (INDEPENDENT_AMBULATORY_CARE_PROVIDER_SITE_OTHER): Payer: PPO | Admitting: Podiatry

## 2015-12-05 ENCOUNTER — Encounter: Payer: Self-pay | Admitting: Podiatry

## 2015-12-05 DIAGNOSIS — M79676 Pain in unspecified toe(s): Secondary | ICD-10-CM | POA: Diagnosis not present

## 2015-12-05 DIAGNOSIS — B351 Tinea unguium: Secondary | ICD-10-CM

## 2015-12-06 ENCOUNTER — Ambulatory Visit (INDEPENDENT_AMBULATORY_CARE_PROVIDER_SITE_OTHER): Payer: PPO | Admitting: Cardiology

## 2015-12-06 ENCOUNTER — Encounter: Payer: Self-pay | Admitting: Cardiology

## 2015-12-06 VITALS — BP 150/72 | HR 65 | Ht 67.0 in | Wt 185.8 lb

## 2015-12-06 DIAGNOSIS — R0602 Shortness of breath: Secondary | ICD-10-CM | POA: Diagnosis not present

## 2015-12-06 DIAGNOSIS — I10 Essential (primary) hypertension: Secondary | ICD-10-CM

## 2015-12-06 DIAGNOSIS — I351 Nonrheumatic aortic (valve) insufficiency: Secondary | ICD-10-CM | POA: Diagnosis not present

## 2015-12-06 DIAGNOSIS — R002 Palpitations: Secondary | ICD-10-CM

## 2015-12-06 DIAGNOSIS — R600 Localized edema: Secondary | ICD-10-CM

## 2015-12-06 NOTE — Patient Instructions (Signed)
Medication Instructions:  Your physician recommends that you continue on your current medications as directed. Please refer to the Current Medication list given to you today.   Labwork: None ordered  Testing/Procedures: None ordered  Follow-Up: Your physician wants you to follow-up in: 6 months with Dr. Ingal. You will receive a reminder letter in the mail two months in advance. If you don't receive a letter, please call our office to schedule the follow-up appointment.   Any Other Special Instructions Will Be Listed Below (If Applicable).     If you need a refill on your cardiac medications before your next appointment, please call your pharmacy.   

## 2015-12-06 NOTE — Progress Notes (Signed)
Cardiology Office Note   Date:  12/06/2015   ID:  Whitney Simmons, DOB 06/30/1928, MRN [id number]  Referring Doctor:  Rica Mast, MD   Cardiologist:   Wende Bushy, MD   Reason for consultation:  Chief Complaint  Patient presents with  . other    F/U holter monitor.  Medications verbally reviewed with patient.       History of Present Illness: Whitney Simmons is a 82 y.o. female who presents for Findings on holter   Patient reports feeling well. Shortness of breath is about the same as before. Because she thinks this has been going on for quite a while and has not worsened lately, she decided not to proceed with stress testing.  In terms of her leg swelling, she has this chronically and she feels that it is stable. She lives at [facility] and thinks that meals are prepared are likely high in sodium. She does not have any control over this unfortunately.  In terms of blood pressure, patient reports that it usually is good. No headaches.  Denies fever, cough, colds, abdominal pain. No chest pain.    ROS:  Please see the history of present illness. Aside from mentioned under HPI, all other systems are reviewed and negative.     Past Medical History  Diagnosis Date  . Irregular heart beat   . Thyroid disease     s/p radioactive iodine ablation 30 years ago  . Hypertension   . Hyperlipidemia   . Edema   . IBS (irritable bowel syndrome)   . 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 Declined postoperative radiation and antiestrogen therapy.  . Breast cancer ([place]) 2015    LT LUMPECTOMY    Past Surgical History  Procedure Laterality Date  . Cholecystectomy    . Cataract extraction    . Toe amputation      left 5th toe  . Breast surgery Left 02-15-14    wide excision     reports that she has never smoked. She has never used smokeless tobacco. She reports that she does not drink alcohol or use illicit drugs.    family history includes Heart disease in her maternal aunt and mother.   Current Outpatient Prescriptions  Medication Sig Dispense Refill  . aspirin 81 MG tablet Take 81 mg by mouth daily.    . fluticasone (FLONASE) 50 MCG/ACT nasal spray Place 2 sprays into both nostrils daily. 16 g 6  . furosemide (LASIX) 40 MG tablet Take 1 tablet (40 mg total) by mouth daily. 90 tablet 3  . gentamicin ointment (GARAMYCIN) 0.1 % Apply 1 application topically 3 (three) times daily. 15 g 0  . levothyroxine (SYNTHROID, LEVOTHROID) 100 MCG tablet TAKE ONE TABLET BY MOUTH ONCE DAILY BEFORE BREAKFAST 90 tablet 0  . losartan (COZAAR) 50 MG tablet Take 1 tablet (50 mg total) by mouth daily. 90 tablet 3  . Multiple Vitamin (MULTIVITAMIN) tablet Take 1 tablet by mouth daily.    . nebivolol (BYSTOLIC) 5 MG tablet Take 1 tablet (5 mg total) by mouth daily. 90 tablet 3  . timolol (BETIMOL) 0.5 % ophthalmic solution Place 1 drop into both eyes daily.     No current facility-administered medications for this visit.    Allergies: Levaquin and Sulfa antibiotics    PHYSICAL EXAM: VS:  BP 150/72 mmHg  Pulse 65  Ht 5\' 7"  (1.702 m)  Wt 185 lb 12.8 oz (84.278  kg)  BMI 29.09 kg/m2  SpO2 98% , Body mass index is 29.09 kg/(m^2). Wt Readings from Last 3 Encounters:  12/06/15 185 lb 12.8 oz (84.278 kg)  11/14/15 187 lb (84.823 kg)  11/06/15 187 lb 3.2 oz (84.913 kg)    GENERAL:  well developed, well nourished, obese, not in acute distress HEENT: normocephalic, pink conjunctivae, anicteric sclerae, no xanthelasma, normal dentition, oropharynx clear NECK:  no neck vein engorgement, JVP normal, no hepatojugular reflux, carotid upstroke brisk and symmetric, no bruit, no thyromegaly, no lymphadenopathy LUNGS:  good respiratory effort, clear to auscultation bilaterally CV:  PMI not displaced, no thrills, no lifts, S1 and S2 within normal limits, no palpable S3 or S4, no murmurs, no rubs, no gallops ABD:  Soft, nontender,  nondistended, normoactive bowel sounds, no abdominal aortic bruit, no hepatomegaly, no splenomegaly MS: nontender back, no kyphosis, no scoliosis, no joint deformities EXT:  2+ DP/PT pulses, 1+ edema, no varicosities, no cyanosis, no clubbing SKIN: warm, nondiaphoretic, normal turgor, no ulcers NEUROPSYCH: alert, oriented to person, place, and time, sensory/motor grossly intact, normal mood, appropriate affect  Recent Labs: 08/29/2015: Pro B Natriuretic peptide (BNP) 60.0 10/05/2015: ALT 11; BUN 19; Creatinine, Ser 1.14; Potassium 4.4; Sodium 137; TSH 2.02 11/06/2015: Hemoglobin 12.1; Platelets 141.0*   Lipid Panel    Component Value Date/Time   CHOL 197 01/23/2015 1002   TRIG 154.0* 01/23/2015 1002   HDL 38.50* 01/23/2015 1002   CHOLHDL 5 01/23/2015 1002   VLDL 30.8 01/23/2015 1002   LDLCALC 128* 01/23/2015 1002     Other studies Reviewed:  EKG:  The ekg from 11/14/2015 was personally reviewed by me and it revealed sinus bradycardia, 59 BPM. Minimal voltage criteria for LVH.  Additional studies/ records that were reviewed personally reviewed by me today include:  Echo 11/09/2015: (Dr. Ubaldo Glassing) Left ventricle: Systolic function was normal. The estimated  ejection fraction was in the range of 50% to 55%. - Aortic valve: There was mild regurgitation. Valve area (Vmax):  2.57 cm^2. PHT 524ms.  Holter 11/21/2015: Overall rhythm was sinus. Heart rate ranged from 53 BPM to 93 bpm, average of 65 BPM. Of note, presence of artifact on rhythm strips.  Ventricular ectopy: [id number] isolated PVCs, 2 ventricle couplets. 65 in ventricular bigeminy. No ventricular runs.  Supraventricular ectopy: 142 single PACs. 31 atrial pairs. 7 atrial bigeminy. 2 atrial runs. Longest run was 4 beat run of likely paroxysmal atrial tachycardia at 7:02 PM, 129 BPM.  No evidence of atrial fibrillation. No evidence of AV conduction disease.       ASSESSMENT AND PLAN: Shortness of breath Lungs are clear. No  evidence of neck vein engorgement. Only pedal edema. No clear evidence on physical exam of congestive heart failure. Need to rule out ischemia as cause of the shortness of breath. Patient has risk factors for CAD. Recommend pharmacologic nuclear stress test. Patient decided to hold off on this for now. She has no chest pain. Recommended that she continue to think about this and let us know if she wants to proceed.  Palpitations 24-hour Holter Holter monitor revealed PVCs and PACs. Short burst of PAT, asymptomatic. No high grade arrhythmia. Patient reassured.  Mild AI No appreciable murmur noted on physical exam. Recommend blood pressure control and serial evaluation.  Pedal edema Again as described above, may not necessarily be related to congestive heart failure. PCP managing diuretics. May try a different diuretic if patient fails to respond to higher doses of furosemide. Sodium restriction recommended as well.  Hypertension  Continue monitoring BP. Continue current medical therapy and lifestyle changes.   Current medicines are reviewed at length with the patient today.  The patient does not have concerns regarding medicines.  Labs/ tests ordered today include:  No orders of the defined types were placed in this encounter.    I had a lengthy and detailed discussion with the patient regarding diagnoses, prognosis, diagnostic options, treatment options , and side effects of medications.   I counseled the patient on importance of lifestyle modification including heart healthy diet, regular physical activity   Disposition:   FU with undersigned in 6-9 months  Signed, Wende Bushy, MD  12/06/2015 9:12 AM    [place]

## 2015-12-18 DIAGNOSIS — M5136 Other intervertebral disc degeneration, lumbar region: Secondary | ICD-10-CM | POA: Diagnosis not present

## 2015-12-18 DIAGNOSIS — M62830 Muscle spasm of back: Secondary | ICD-10-CM | POA: Diagnosis not present

## 2015-12-18 DIAGNOSIS — M9902 Segmental and somatic dysfunction of thoracic region: Secondary | ICD-10-CM | POA: Diagnosis not present

## 2015-12-18 DIAGNOSIS — M9903 Segmental and somatic dysfunction of lumbar region: Secondary | ICD-10-CM | POA: Diagnosis not present

## 2015-12-21 ENCOUNTER — Other Ambulatory Visit: Payer: Self-pay | Admitting: Internal Medicine

## 2015-12-22 ENCOUNTER — Other Ambulatory Visit: Payer: Self-pay | Admitting: Internal Medicine

## 2016-01-02 ENCOUNTER — Ambulatory Visit (INDEPENDENT_AMBULATORY_CARE_PROVIDER_SITE_OTHER): Payer: PPO | Admitting: Podiatry

## 2016-01-02 ENCOUNTER — Encounter: Payer: Self-pay | Admitting: Podiatry

## 2016-01-02 DIAGNOSIS — M79676 Pain in unspecified toe(s): Secondary | ICD-10-CM

## 2016-01-02 DIAGNOSIS — B351 Tinea unguium: Secondary | ICD-10-CM | POA: Diagnosis not present

## 2016-01-02 NOTE — Progress Notes (Signed)
She presents today with a chief complaint of painful elongated toenails.  Objective: Toenails are thick yellow dystrophic with mycotic and painful palpation.  Assessment: Pain in limb secondary to onychomycosis 1 through 5 bilateral.  Plan: Debridement of toenails 1 through 5 bilateral.

## 2016-01-08 ENCOUNTER — Encounter: Payer: Self-pay | Admitting: *Deleted

## 2016-01-15 DIAGNOSIS — M5136 Other intervertebral disc degeneration, lumbar region: Secondary | ICD-10-CM | POA: Diagnosis not present

## 2016-01-15 DIAGNOSIS — M62830 Muscle spasm of back: Secondary | ICD-10-CM | POA: Diagnosis not present

## 2016-01-15 DIAGNOSIS — M9903 Segmental and somatic dysfunction of lumbar region: Secondary | ICD-10-CM | POA: Diagnosis not present

## 2016-01-15 DIAGNOSIS — M9902 Segmental and somatic dysfunction of thoracic region: Secondary | ICD-10-CM | POA: Diagnosis not present

## 2016-01-31 ENCOUNTER — Other Ambulatory Visit: Payer: Self-pay | Admitting: Internal Medicine

## 2016-01-31 ENCOUNTER — Ambulatory Visit
Admission: RE | Admit: 2016-01-31 | Discharge: 2016-01-31 | Disposition: A | Discharge status: Home / Self Care | Payer: PPO | Source: Ambulatory Visit | Attending: Internal Medicine | Admitting: Internal Medicine

## 2016-01-31 DIAGNOSIS — N63 Unspecified lump in breast: Secondary | ICD-10-CM | POA: Diagnosis not present

## 2016-01-31 DIAGNOSIS — Z9889 Other specified postprocedural states: Secondary | ICD-10-CM | POA: Insufficient documentation

## 2016-01-31 DIAGNOSIS — N6320 Unspecified lump in the left breast, unspecified quadrant: Secondary | ICD-10-CM

## 2016-01-31 DIAGNOSIS — R928 Other abnormal and inconclusive findings on diagnostic imaging of breast: Secondary | ICD-10-CM

## 2016-02-14 ENCOUNTER — Ambulatory Visit (INDEPENDENT_AMBULATORY_CARE_PROVIDER_SITE_OTHER): Payer: PPO | Admitting: General Surgery

## 2016-02-14 ENCOUNTER — Encounter: Payer: Self-pay | Admitting: General Surgery

## 2016-02-14 VITALS — BP 132/74 | Ht 67.0 in | Wt 184.0 lb

## 2016-02-14 DIAGNOSIS — D0512 Intraductal carcinoma in situ of left breast: Secondary | ICD-10-CM | POA: Diagnosis not present

## 2016-02-14 NOTE — Patient Instructions (Addendum)
The patient is aware to call back for any questions or concerns. The patient has been asked to return to the office in one year with a bilateral diagnostic mammogram. 

## 2016-02-14 NOTE — Progress Notes (Signed)
Patient ID: Whitney Simmons, female   DOB: May 01, 1928, 82 y.o.   MRN: [id number]  Chief Complaint  Patient presents with  . Follow-up    mammogram    HPI Whitney Simmons is a 82 y.o. female  who presents for her follow up breast cancer and a breast evaluation. The most recent mammogram was done on 01-31-16.  Patient does perform regular self breast checks and gets regular mammograms done. Patient reports no new changes.    She is here today with her daughter, [name].  HPI  Past Medical History:  Diagnosis Date  . Breast cancer ([place]) 2015   LT LUMPECTOMY  . 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 Declined postoperative radiation and antiestrogen therapy.  . Edema   . Hyperlipidemia   . Hypertension   . IBS (irritable bowel syndrome)   . Irregular heart beat   . Thyroid disease    s/p radioactive iodine ablation 30 years ago    Past Surgical History:  Procedure Laterality Date  . BREAST SURGERY Left 02-15-14   wide excision  . CATARACT EXTRACTION    . CHOLECYSTECTOMY    . TOE AMPUTATION     left 5th toe    Family History  Problem Relation Age of Onset  . Heart disease Mother   . Heart disease Maternal Aunt     Social History Social History  Substance Use Topics  . Smoking status: Never Smoker  . Smokeless tobacco: Never Used  . Alcohol use No    Allergies  Allergen Reactions  . Levaquin [Levofloxacin In D5w] Other (See Comments)    hallucinations  . Sulfa Antibiotics     Hallucinations     Current Outpatient Prescriptions  Medication Sig Dispense Refill  . aspirin 81 MG tablet Take 81 mg by mouth daily.    . fluticasone (FLONASE) 50 MCG/ACT nasal spray Place 2 sprays into both nostrils daily. 16 g 6  . furosemide (LASIX) 40 MG tablet Take 1 tablet (40 mg total) by mouth daily. 90 tablet 3  . levothyroxine (SYNTHROID, LEVOTHROID) 100 MCG tablet TAKE ONE TABLET BY MOUTH ONCE DAILY BEFORE BREAKFAST 90 tablet 3   . losartan (COZAAR) 50 MG tablet Take 1 tablet (50 mg total) by mouth daily. 90 tablet 3  . Multiple Vitamin (MULTIVITAMIN) tablet Take 1 tablet by mouth daily.    . nebivolol (BYSTOLIC) 5 MG tablet Take 1 tablet (5 mg total) by mouth daily. 90 tablet 3  . timolol (BETIMOL) 0.5 % ophthalmic solution Place 1 drop into both eyes daily.     No current facility-administered medications for this visit.     Review of Systems Review of Systems  Constitutional: Negative.   Respiratory: Negative.   Cardiovascular: Negative.     Blood pressure 132/74, height 5\' 7"  (1.702 m), weight 184 lb (83.5 kg).  Physical Exam Physical Exam  Constitutional: She is oriented to person, place, and time. She appears well-developed and well-nourished.  HENT:  Mouth/Throat: Oropharynx is clear and moist.  Eyes: Conjunctivae are normal. No scleral icterus.  Neck: Neck supple.  Cardiovascular: Normal rate, regular rhythm and normal heart sounds.   Pulmonary/Chest: Effort normal and breath sounds normal. Right breast exhibits no inverted nipple, no mass, no nipple discharge, no skin change and no tenderness. Left breast exhibits no inverted nipple, no mass, no nipple discharge, no skin change and no tenderness.    Lymphadenopathy:    She has no  cervical adenopathy.    She has no axillary adenopathy.  Neurological: She is alert and oriented to person, place, and time.  Skin: Skin is warm and dry.  Psychiatric: Her behavior is normal.    Data Reviewed Bilateral mammograms dated 01/31/2016 were reviewed. Postoperative changes are noted in the upper-outer quadrant of the right breast. Oil cyst account for the palpable mass in the area below the wide excision site. BI-RADS-2.  Assessment    No evidence of recurrent disease involving the breast.    Plan    The patient had declined adjuvant radiation chemotherapy. She was pleased to hear that there is presently a trial to evaluate the present  recommendations for treatment of intermediate grade DCIS.    The patient has been asked to return to the office in one year with a bilateral diagnostic mammogram.   This information has been scribed by Karie Fetch RN, BSN,BC.  Robert Bellow 02/16/2016, 5:53 AM

## 2016-02-18 ENCOUNTER — Ambulatory Visit: Payer: PPO | Admitting: General Surgery

## 2016-02-19 ENCOUNTER — Other Ambulatory Visit: Payer: Self-pay | Admitting: Surgical

## 2016-02-19 ENCOUNTER — Encounter: Payer: Self-pay | Admitting: Family Medicine

## 2016-02-19 MED ORDER — NEBIVOLOL HCL 5 MG PO TABS: 5.0000 mg | ORAL_TABLET | Freq: Every day | ORAL | 0 refills | Status: DC

## 2016-02-20 ENCOUNTER — Ambulatory Visit: Payer: PPO | Admitting: General Surgery

## 2016-02-25 ENCOUNTER — Ambulatory Visit (INDEPENDENT_AMBULATORY_CARE_PROVIDER_SITE_OTHER): Payer: PPO | Admitting: Family

## 2016-02-25 ENCOUNTER — Encounter: Payer: Self-pay | Admitting: Family

## 2016-02-25 VITALS — BP 144/64 | HR 68 | Temp 97.7°F | Ht 66.5 in | Wt 186.4 lb

## 2016-02-25 DIAGNOSIS — R140 Abdominal distension (gaseous): Secondary | ICD-10-CM | POA: Diagnosis not present

## 2016-02-25 DIAGNOSIS — N907 Vulvar cyst: Secondary | ICD-10-CM

## 2016-02-25 NOTE — Progress Notes (Signed)
Subjective:    Patient ID: Whitney Simmons, female    DOB: Feb 01, 1928, 82 y.o.   MRN: [id number]  CC: Whitney Simmons is a 82 y.o. female who presents today for an acute visit.    HPI: Patient here for acute visit with chief complaint of cyst on left labia, improved over last week. Had drained and now smaller. No fever, chills. No history of MRSA or boils.  She also notes 'tightness' LLQ for weeks. 'Not pain'. Occasional bloated after eating, however attributes this to chronic IBS which fluctuates between constipation, diarrhea. No unintentional weight loss, night sweats. H/o breast cancer.      HISTORY:  Past Medical History:  Diagnosis Date  . Breast cancer ([place]) 2015   LT LUMPECTOMY  . 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 Declined postoperative radiation and antiestrogen therapy.  . Edema   . Hyperlipidemia   . Hypertension   . IBS (irritable bowel syndrome)   . Irregular heart beat   . Thyroid disease    s/p radioactive iodine ablation 30 years ago   Past Surgical History:  Procedure Laterality Date  . BREAST SURGERY Left 02-15-14   wide excision  . CATARACT EXTRACTION    . CHOLECYSTECTOMY    . TOE AMPUTATION     left 5th toe   Family History  Problem Relation Age of Onset  . Heart disease Mother   . Heart disease Maternal Aunt     Allergies: Levaquin [levofloxacin in d5w] and Sulfa antibiotics Current Outpatient Prescriptions on File Prior to Visit  Medication Sig Dispense Refill  . aspirin 81 MG tablet Take 81 mg by mouth daily.    . fluticasone (FLONASE) 50 MCG/ACT nasal spray Place 2 sprays into both nostrils daily. 16 g 6  . furosemide (LASIX) 40 MG tablet Take 1 tablet (40 mg total) by mouth daily. 90 tablet 3  . levothyroxine (SYNTHROID, LEVOTHROID) 100 MCG tablet TAKE ONE TABLET BY MOUTH ONCE DAILY BEFORE BREAKFAST 90 tablet 3  . losartan (COZAAR) 50 MG tablet Take 1 tablet (50 mg total) by mouth daily. 90  tablet 3  . Multiple Vitamin (MULTIVITAMIN) tablet Take 1 tablet by mouth daily.    . nebivolol (BYSTOLIC) 5 MG tablet Take 1 tablet (5 mg total) by mouth daily. 90 tablet 0  . timolol (BETIMOL) 0.5 % ophthalmic solution Place 1 drop into both eyes daily.     No current facility-administered medications on file prior to visit.     Social History  Substance Use Topics  . Smoking status: Never Smoker  . Smokeless tobacco: Never Used  . Alcohol use No    Review of Systems  Constitutional: Negative for chills and fever.  Respiratory: Negative for cough.   Cardiovascular: Negative for chest pain and palpitations.  Gastrointestinal: Positive for abdominal distention, constipation and diarrhea. Negative for abdominal pain, blood in stool, nausea and vomiting.  Genitourinary: Negative for vaginal bleeding.  Skin: Positive for wound.      Objective:    BP (!) 144/64   Pulse 68   Temp 97.7 F (36.5 C) (Oral)   Ht 5' 6.5" (1.689 m)   Wt 186 lb 6.4 oz (84.6 kg)   SpO2 97%   BMI 29.64 kg/m    Physical Exam  Constitutional: She appears well-developed and well-nourished.  Eyes: Conjunctivae are normal.  Cardiovascular: Normal rate, regular rhythm, normal heart sounds and normal pulses.   Pulmonary/Chest: Effort normal and  breath sounds normal. She has no wheezes. She has no rhonchi. She has no rales. She exhibits no tenderness.  Abdominal: Soft. Normal appearance and bowel sounds are normal. She exhibits no distension, no fluid wave, no ascites and no mass. There is no tenderness. There is no rigidity, no rebound, no guarding and no CVA tenderness.  Genitourinary: Vagina normal. There is no rash, tenderness, lesion or injury on the right labia. There is no rash, tenderness, lesion or injury on the left labia. No erythema or tenderness in the vagina. No vaginal discharge found.  Genitourinary Comments: Unable to appreciate swelling, nodule, or tenderness of labia.  Neurological: She is  alert.  Skin: Skin is warm and dry.  Psychiatric: She has a normal mood and affect. Her speech is normal and behavior is normal. Thought content normal.  Vitals reviewed.      Assessment & Plan:   1. Abdominal distension No acute abdominal  Pain to suggest acute , infectious process.Concern for ovarian cancer. H/o of breast cancer. Pending CT. Alternately, likely IBS symptom. - CT Abdomen Pelvis Wo Contrast; Future  2. Labial cyst Resolved. Return precautions given.     I am having Whitney Simmons maintain her aspirin, multivitamin, fluticasone, timolol, losartan, furosemide, levothyroxine, and nebivolol.   No orders of the defined types were placed in this encounter.   Return precautions given.   Risks, benefits, and alternatives of the medications and treatment plan prescribed today were discussed, and patient expressed understanding.   Education regarding symptom management and diagnosis given to patient on AVS.  Continue to follow with Tommi Rumps, MD for routine health maintenance.   Whitney Simmons and I agreed with plan.   Whitney Paris, FNP

## 2016-02-25 NOTE — Patient Instructions (Signed)
My pleasure meeting you today.  We will call you and schedule the CT scan of your abdomen.   Please do sitz baths and warm compresses if your vaginal cyst returns- also let us know.

## 2016-02-25 NOTE — Progress Notes (Signed)
Pre visit review using our clinic review tool, if applicable. No additional management support is needed unless otherwise documented below in the visit note. 

## 2016-03-04 DIAGNOSIS — H6121 Impacted cerumen, right ear: Secondary | ICD-10-CM | POA: Diagnosis not present

## 2016-03-04 DIAGNOSIS — H903 Sensorineural hearing loss, bilateral: Secondary | ICD-10-CM | POA: Diagnosis not present

## 2016-03-07 ENCOUNTER — Ambulatory Visit
Admission: RE | Admit: 2016-03-07 | Discharge: 2016-03-07 | Disposition: A | Discharge status: Home / Self Care | Payer: PPO | Source: Ambulatory Visit | Attending: Family | Admitting: Family

## 2016-03-07 DIAGNOSIS — I700 Atherosclerosis of aorta: Secondary | ICD-10-CM | POA: Insufficient documentation

## 2016-03-07 DIAGNOSIS — K5730 Diverticulosis of large intestine without perforation or abscess without bleeding: Secondary | ICD-10-CM | POA: Diagnosis not present

## 2016-03-07 DIAGNOSIS — R140 Abdominal distension (gaseous): Secondary | ICD-10-CM | POA: Diagnosis not present

## 2016-03-10 ENCOUNTER — Ambulatory Visit: Payer: PPO | Admitting: Family Medicine

## 2016-03-11 ENCOUNTER — Ambulatory Visit: Payer: PPO | Admitting: Internal Medicine

## 2016-03-11 DIAGNOSIS — M9903 Segmental and somatic dysfunction of lumbar region: Secondary | ICD-10-CM | POA: Diagnosis not present

## 2016-03-11 DIAGNOSIS — M62830 Muscle spasm of back: Secondary | ICD-10-CM | POA: Diagnosis not present

## 2016-03-11 DIAGNOSIS — M9902 Segmental and somatic dysfunction of thoracic region: Secondary | ICD-10-CM | POA: Diagnosis not present

## 2016-03-11 DIAGNOSIS — M5136 Other intervertebral disc degeneration, lumbar region: Secondary | ICD-10-CM | POA: Diagnosis not present

## 2016-03-18 ENCOUNTER — Ambulatory Visit (INDEPENDENT_AMBULATORY_CARE_PROVIDER_SITE_OTHER): Payer: PPO | Admitting: *Deleted

## 2016-03-18 DIAGNOSIS — Z23 Encounter for immunization: Secondary | ICD-10-CM | POA: Diagnosis not present

## 2016-03-25 ENCOUNTER — Ambulatory Visit (INDEPENDENT_AMBULATORY_CARE_PROVIDER_SITE_OTHER): Payer: PPO | Admitting: Family Medicine

## 2016-03-25 ENCOUNTER — Encounter: Payer: Self-pay | Admitting: Family Medicine

## 2016-03-25 VITALS — BP 110/72 | HR 72 | Temp 97.7°F | Wt 183.4 lb

## 2016-03-25 DIAGNOSIS — D696 Thrombocytopenia, unspecified: Secondary | ICD-10-CM | POA: Diagnosis not present

## 2016-03-25 DIAGNOSIS — E039 Hypothyroidism, unspecified: Secondary | ICD-10-CM

## 2016-03-25 DIAGNOSIS — I10 Essential (primary) hypertension: Secondary | ICD-10-CM | POA: Diagnosis not present

## 2016-03-25 LAB — CBC WITH DIFFERENTIAL/PLATELET
Basophils Absolute: 0 10*3/uL (ref 0.0–0.1)
Basophils Relative: 0.7 % (ref 0.0–3.0)
Eosinophils Absolute: 0.2 10*3/uL (ref 0.0–0.7)
Eosinophils Relative: 2.5 % (ref 0.0–5.0)
HCT: 38.7 % (ref 36.0–46.0)
Hemoglobin: 12.9 g/dL (ref 12.0–15.0)
Lymphocytes Relative: 17.2 % (ref 12.0–46.0)
Lymphs Abs: 1.1 10*3/uL (ref 0.7–4.0)
MCHC: 33.4 g/dL (ref 30.0–36.0)
MCV: 88.5 fl (ref 78.0–100.0)
Monocytes Absolute: 0.6 10*3/uL (ref 0.1–1.0)
Monocytes Relative: 8.7 % (ref 3.0–12.0)
Neutro Abs: 4.7 10*3/uL (ref 1.4–7.7)
Neutrophils Relative %: 70.9 % (ref 43.0–77.0)
Platelets: 85 10*3/uL — ABNORMAL LOW (ref 150.0–400.0)
RBC: 4.37 Mil/uL (ref 3.87–5.11)
RDW: 13.9 % (ref 11.5–15.5)
WBC: 6.6 10*3/uL (ref 4.0–10.5)

## 2016-03-25 LAB — HEMOGLOBIN A1C: Hgb A1c MFr Bld: 6 % (ref 4.6–6.5)

## 2016-03-25 LAB — COMPREHENSIVE METABOLIC PANEL
ALT: 8 U/L (ref 0–35)
AST: 14 U/L (ref 0–37)
Albumin: 3.5 g/dL (ref 3.5–5.2)
Alkaline Phosphatase: 85 U/L (ref 39–117)
BUN: 21 mg/dL (ref 6–23)
CO2: 30 mEq/L (ref 19–32)
Calcium: 8.9 mg/dL (ref 8.4–10.5)
Chloride: 102 mEq/L (ref 96–112)
Creatinine, Ser: 1.25 mg/dL — ABNORMAL HIGH (ref 0.40–1.20)
GFR: 43.16 mL/min — ABNORMAL LOW (ref >60.00)
Glucose, Bld: 89 mg/dL (ref 70–99)
Potassium: 4.3 mEq/L (ref 3.5–5.1)
Sodium: 139 mEq/L (ref 135–145)
Total Bilirubin: 0.4 mg/dL (ref 0.2–1.2)
Total Protein: 7.1 g/dL (ref 6.0–8.3)

## 2016-03-25 LAB — TSH: TSH: 3.36 u[IU]/mL (ref 0.35–4.50)

## 2016-03-25 NOTE — Assessment & Plan Note (Signed)
At goal. Continue current medications. Check a CMP and A1c today.

## 2016-03-25 NOTE — Assessment & Plan Note (Signed)
Check a TSH.  Continue Synthroid. 

## 2016-03-25 NOTE — Patient Instructions (Signed)
Nice to see you. Please continue your current medications. We will get some blood work and call you with the results.

## 2016-03-25 NOTE — Progress Notes (Signed)
  Tommi Rumps, MD Phone: [phone]  Whitney Simmons is a 82 y.o. female who presents today for follow-up.  HYPERTENSION  Disease Monitoring  Home BP Monitoring not checking very frequently Chest pain- no    Dyspnea- no Medications  Compliance-  taking Lasix, Bystolic, losartan.  Edema- no  HYPOTHYROIDISM Disease Monitoring Weight changes: Weight gain  Skin Changes: No Heat/Cold intolerance: No  Medication Monitoring Compliance:  Taking Synthroid 100 g daily   Last TSH:   Lab Results  Component Value Date   TSH 2.02 10/05/2015   Thrombocytopenia: Last platelet count increased slightly though still thrombocytopenic. No bleeding. Does bruise easily. Does take aspirin.   PMH: nonsmoker.   ROS see history of present illness  Objective  Physical Exam Vitals:   03/25/16 0840  BP: 110/72  Pulse: 72  Temp: 97.7 F (36.5 C)    BP Readings from Last 3 Encounters:  03/25/16 110/72  02/25/16 (!) 144/64  02/14/16 132/74   Wt Readings from Last 3 Encounters:  03/25/16 183 lb 6.4 oz (83.2 kg)  02/25/16 186 lb 6.4 oz (84.6 kg)  02/14/16 184 lb (83.5 kg)    Physical Exam  Constitutional: She is well-developed, well-nourished, and in no distress.  HENT:  Head: Normocephalic and atraumatic.  Cardiovascular: Normal rate, regular rhythm and normal heart sounds.   Pulmonary/Chest: Effort normal and breath sounds normal.  Musculoskeletal: She exhibits no edema.  Neurological: She is alert. Gait normal.  Skin: Skin is warm and dry.     Assessment/Plan: Please see individual problem list.  Hypertension At goal. Continue current medications. Check a CMP and A1c today.  Hypothyroidism Check a TSH. Continue Synthroid.  Thrombocytopenia (HCC) Check CBC today.   Orders Placed This Encounter  Procedures  . Comp Met (CMET)  . TSH  . CBC w/Diff  . HgB A1c     Tommi Rumps, MD [place]

## 2016-03-25 NOTE — Assessment & Plan Note (Signed)
Check CBC today.  

## 2016-03-25 NOTE — Progress Notes (Signed)
Pre visit review using our clinic review tool, if applicable. No additional management support is needed unless otherwise documented below in the visit note. 

## 2016-03-27 ENCOUNTER — Telehealth: Payer: Self-pay | Admitting: Family Medicine

## 2016-03-27 ENCOUNTER — Other Ambulatory Visit: Payer: Self-pay | Admitting: Family Medicine

## 2016-03-27 ENCOUNTER — Encounter: Payer: Self-pay | Admitting: Family Medicine

## 2016-03-27 DIAGNOSIS — D696 Thrombocytopenia, unspecified: Secondary | ICD-10-CM

## 2016-03-27 NOTE — Telephone Encounter (Signed)
Pt called returning your call regarding lab results. Thank you!  Call pt @ [phone]

## 2016-03-28 ENCOUNTER — Encounter: Payer: Self-pay | Admitting: Family Medicine

## 2016-03-28 NOTE — Telephone Encounter (Signed)
Spoke with patient and she would like to hold off on the hematology referral until you have had a chance to look at all lab work. She has seen speciality in the past about this and they explained it that it has something to do with the tubes that is used to draw the blood. She has sent you a my chart message about this as well.

## 2016-03-28 NOTE — Telephone Encounter (Signed)
Left message for patient to return call.

## 2016-03-28 NOTE — Telephone Encounter (Signed)
Noted. I sent her my chart message regarding this. Please contact her to see if she can return in about a month to have this rechecked.

## 2016-03-31 NOTE — Telephone Encounter (Signed)
Spoke with patient and she is going to come in on 04/11/16 for repeat labs.

## 2016-04-03 ENCOUNTER — Encounter: Payer: Self-pay | Admitting: Family Medicine

## 2016-04-07 ENCOUNTER — Encounter: Payer: Self-pay | Admitting: Podiatry

## 2016-04-07 ENCOUNTER — Ambulatory Visit (INDEPENDENT_AMBULATORY_CARE_PROVIDER_SITE_OTHER): Payer: PPO | Admitting: Podiatry

## 2016-04-07 DIAGNOSIS — Q828 Other specified congenital malformations of skin: Secondary | ICD-10-CM

## 2016-04-07 DIAGNOSIS — B351 Tinea unguium: Secondary | ICD-10-CM | POA: Diagnosis not present

## 2016-04-07 DIAGNOSIS — M79676 Pain in unspecified toe(s): Secondary | ICD-10-CM

## 2016-04-08 NOTE — Progress Notes (Signed)
She presents the chief complaint of painful elongated toenails. She is also complaining all reactive hyperkeratoses and painful corns and calluses.  Objective: Vital signs are stable alert and oriented 3. Pulses are palpable. Neurologic sensorium is intact. Deep tendon reflexes are intact. Muscle strength is normal. Toenails are thick yellow dystrophic onychomycotic painful palpation. Multiple porokeratotic lesions plantar aspect of the bilateral foot and tubes of the toes.  Assessment: Pain limb secondary to onychomycosis. Porokeratosis bilateral.  Plan: Debridement of toenails 1 through 5 bilateral. Follow up with her in 3 months. Debridement of all reactive hyperkeratotic tissue.

## 2016-04-09 ENCOUNTER — Ambulatory Visit: Payer: PPO | Admitting: Hematology and Oncology

## 2016-04-10 ENCOUNTER — Telehealth: Payer: Self-pay

## 2016-04-10 DIAGNOSIS — D696 Thrombocytopenia, unspecified: Secondary | ICD-10-CM

## 2016-04-10 NOTE — Telephone Encounter (Signed)
Pt coming for repeat lab 04/11/16. Need future order placed. Thank you.

## 2016-04-10 NOTE — Telephone Encounter (Signed)
Order placed. Please make sure you draw this with the largest caliber to being you can. Thanks.

## 2016-04-10 NOTE — Telephone Encounter (Signed)
Noted! Thank you

## 2016-04-11 ENCOUNTER — Other Ambulatory Visit (INDEPENDENT_AMBULATORY_CARE_PROVIDER_SITE_OTHER): Payer: PPO

## 2016-04-11 DIAGNOSIS — D696 Thrombocytopenia, unspecified: Secondary | ICD-10-CM | POA: Diagnosis not present

## 2016-04-11 NOTE — Progress Notes (Signed)
Received a call from lab stating that blood clotted & unable to run test. They recommend a recollect and she will need a lavender & blue top tube. Pt notified & will come back in on Tuesday.

## 2016-04-11 NOTE — Addendum Note (Signed)
Addended by: Leeanne Rio on: 04/11/2016 03:25 PM   Modules accepted: Orders

## 2016-04-14 DIAGNOSIS — H401131 Primary open-angle glaucoma, bilateral, mild stage: Secondary | ICD-10-CM | POA: Diagnosis not present

## 2016-04-15 ENCOUNTER — Other Ambulatory Visit (INDEPENDENT_AMBULATORY_CARE_PROVIDER_SITE_OTHER): Payer: PPO

## 2016-04-15 DIAGNOSIS — D696 Thrombocytopenia, unspecified: Secondary | ICD-10-CM | POA: Diagnosis not present

## 2016-04-15 LAB — CBC WITH DIFFERENTIAL/PLATELET
Basophils Absolute: 0 10*3/uL (ref 0.0–0.1)
Basophils Relative: 0.6 % (ref 0.0–3.0)
Eosinophils Absolute: 0.2 10*3/uL (ref 0.0–0.7)
Eosinophils Relative: 2.4 % (ref 0.0–5.0)
HCT: 38.1 % (ref 36.0–46.0)
Hemoglobin: 12.6 g/dL (ref 12.0–15.0)
Lymphocytes Relative: 16.9 % (ref 12.0–46.0)
Lymphs Abs: 1.3 10*3/uL (ref 0.7–4.0)
MCHC: 33 g/dL (ref 30.0–36.0)
MCV: 89.1 fl (ref 78.0–100.0)
Monocytes Absolute: 0.6 10*3/uL (ref 0.1–1.0)
Monocytes Relative: 8.4 % (ref 3.0–12.0)
Neutro Abs: 5.5 10*3/uL (ref 1.4–7.7)
Neutrophils Relative %: 71.7 % (ref 43.0–77.0)
Platelets: 224.3 10*3/uL (ref 150.0–400.0)
RBC: 4.28 Mil/uL (ref 3.87–5.11)
RDW: 14 % (ref 11.5–15.5)
WBC: 7.6 10*3/uL (ref 4.0–10.5)

## 2016-04-16 ENCOUNTER — Encounter: Payer: Self-pay | Admitting: Family Medicine

## 2016-04-16 ENCOUNTER — Ambulatory Visit: Payer: PPO | Admitting: Hematology and Oncology

## 2016-04-22 DIAGNOSIS — H401131 Primary open-angle glaucoma, bilateral, mild stage: Secondary | ICD-10-CM | POA: Diagnosis not present

## 2016-05-06 DIAGNOSIS — M62830 Muscle spasm of back: Secondary | ICD-10-CM | POA: Diagnosis not present

## 2016-05-06 DIAGNOSIS — M5136 Other intervertebral disc degeneration, lumbar region: Secondary | ICD-10-CM | POA: Diagnosis not present

## 2016-05-06 DIAGNOSIS — M9902 Segmental and somatic dysfunction of thoracic region: Secondary | ICD-10-CM | POA: Diagnosis not present

## 2016-05-06 DIAGNOSIS — M9903 Segmental and somatic dysfunction of lumbar region: Secondary | ICD-10-CM | POA: Diagnosis not present

## 2016-05-10 NOTE — Progress Notes (Signed)
This encounter was created in error - please disregard.

## 2016-05-28 ENCOUNTER — Other Ambulatory Visit: Payer: Self-pay | Admitting: Family Medicine

## 2016-06-03 ENCOUNTER — Telehealth: Payer: Self-pay | Admitting: Family Medicine

## 2016-06-03 MED ORDER — LOSARTAN POTASSIUM 50 MG PO TABS: 50.0000 mg | ORAL_TABLET | Freq: Every day | ORAL | 3 refills | Status: DC

## 2016-06-03 NOTE — Telephone Encounter (Signed)
Pt called and is requesting a refill on losartan (COZAAR) 50 MG tablet she has no medication for today. Thank you!  [place], [place]  Call pt @ [phone]

## 2016-06-03 NOTE — Telephone Encounter (Signed)
RX sent to pharmacy and patient notified.  

## 2016-06-18 ENCOUNTER — Ambulatory Visit: Payer: PPO | Admitting: Podiatry

## 2016-06-25 ENCOUNTER — Ambulatory Visit (INDEPENDENT_AMBULATORY_CARE_PROVIDER_SITE_OTHER): Payer: PPO | Admitting: Family Medicine

## 2016-06-25 ENCOUNTER — Encounter: Payer: Self-pay | Admitting: Family Medicine

## 2016-06-25 VITALS — BP 144/76 | HR 60 | Temp 97.7°F | Wt 184.2 lb

## 2016-06-25 DIAGNOSIS — I10 Essential (primary) hypertension: Secondary | ICD-10-CM

## 2016-06-25 DIAGNOSIS — R300 Dysuria: Secondary | ICD-10-CM | POA: Diagnosis not present

## 2016-06-25 DIAGNOSIS — N183 Chronic kidney disease, stage 3 unspecified: Secondary | ICD-10-CM

## 2016-06-25 LAB — POCT URINALYSIS DIPSTICK
Bilirubin, UA: NEGATIVE
Blood, UA: NEGATIVE
Glucose, UA: NEGATIVE
Ketones, UA: NEGATIVE
Leukocytes, UA: NEGATIVE
Nitrite, UA: NEGATIVE
Protein, UA: NEGATIVE
Spec Grav, UA: >=1.03
Urobilinogen, UA: 0.2
pH, UA: 6

## 2016-06-25 LAB — BASIC METABOLIC PANEL
BUN: 22 mg/dL (ref 6–23)
CO2: 28 mEq/L (ref 19–32)
Calcium: 9.1 mg/dL (ref 8.4–10.5)
Chloride: 104 mEq/L (ref 96–112)
Creatinine, Ser: 1.15 mg/dL (ref 0.40–1.20)
GFR: 47.49 mL/min — ABNORMAL LOW (ref >60.00)
Glucose, Bld: 85 mg/dL (ref 70–99)
Potassium: 4.4 mEq/L (ref 3.5–5.1)
Sodium: 140 mEq/L (ref 135–145)

## 2016-06-25 MED ORDER — FLUCONAZOLE 150 MG PO TABS
150.0000 mg | ORAL_TABLET | ORAL | 0 refills | Status: DC
Start: 2016-06-25 — End: 2016-09-23

## 2016-06-25 NOTE — Patient Instructions (Addendum)
Nice to see you. Please continue to take your blood pressure medications. Please avoid any anti-inflammatory such as ibuprofen and Aleve. Please try to drink plenty of fluids. We will check your kidney function today.  We will start you on Diflucan to treat possible yeast infection given her itching. If you develop fevers, abdominal pain, burning with urination, frequent urination, urgency, or any new or changing symptoms please seek medical attention immediately.

## 2016-06-25 NOTE — Assessment & Plan Note (Signed)
Repeat function today.

## 2016-06-25 NOTE — Assessment & Plan Note (Signed)
Near goal given age. Has not taken her medicines today. She will take them when she gets home. Continue to monitor in the office.

## 2016-06-25 NOTE — Assessment & Plan Note (Signed)
Patient notes mild dysuria though also notes some itching and mild discharge. Discussed pelvic exam though she declined this today. UA was checked revealing no signs of UTI. Suspect related to yeast infection given itching and discharge with no odor. We will treat with Diflucan empirically given that she declined pelvic exam. If no improvement she'll follow-up for pelvic exam. She is given return precautions.

## 2016-06-25 NOTE — Progress Notes (Signed)
  Tommi Rumps, MD Phone: [phone]  Whitney Simmons is a 82 y.o. female who presents today for f/u.  HYPERTENSION  Disease Monitoring  Home BP Monitoring not checking Chest pain- no    Dyspnea- no Medications  Compliance-  Taking bystolic, lasix, and losartan.  Edema- minimal and chronic, no orthopnea or PND  CKD Stage III: Has seen nephrologist in the past though not recently. She avoids NSAIDs. She's not taking in fluids like she should. Feels as though she doesn't urinate as much as she should.  Patient notes some mild itching with urination and mild dysuria. No urinary frequency or urgency. She notes a slight discharge. No abdominal pain. No odor to this.   PMH: nonsmoker.   ROS see history of present illness  Objective  Physical Exam Vitals:   06/25/16 0803  BP: (!) 144/76  Pulse: 60  Temp: 97.7 F (36.5 C)    BP Readings from Last 3 Encounters:  06/25/16 (!) 144/76  03/25/16 110/72  02/25/16 (!) 144/64   Wt Readings from Last 3 Encounters:  06/25/16 184 lb 3.2 oz (83.6 kg)  03/25/16 183 lb 6.4 oz (83.2 kg)  02/25/16 186 lb 6.4 oz (84.6 kg)    Physical Exam  Constitutional: No distress.  HENT:  Head: Normocephalic and atraumatic.  Mouth/Throat: Oropharynx is clear and moist. No oropharyngeal exudate.  Normal TMs bilaterally  Eyes: Conjunctivae are normal. Pupils are equal, round, and reactive to light.  Cardiovascular: Normal rate, regular rhythm and normal heart sounds.   Pulmonary/Chest: Effort normal and breath sounds normal.  Abdominal: Soft. Bowel sounds are normal. She exhibits no distension. There is no tenderness. There is no rebound and no guarding.  Genitourinary:  Genitourinary Comments: Patient declined pelvic exam  Neurological: She is alert. Gait normal.  Skin: She is not diaphoretic.     Assessment/Plan: Please see individual problem list.  Hypertension Near goal given age. Has not taken her medicines today. She will take them  when she gets home. Continue to monitor in the office.  Chronic kidney disease, stage III (moderate) Repeat function today.  Dysuria Patient notes mild dysuria though also notes some itching and mild discharge. Discussed pelvic exam though she declined this today. UA was checked revealing no signs of UTI. Suspect related to yeast infection given itching and discharge with no odor. We will treat with Diflucan empirically given that she declined pelvic exam. If no improvement she'll follow-up for pelvic exam. She is given return precautions.   Orders Placed This Encounter  Procedures  . Basic metabolic panel  . POCT Urinalysis Dipstick    Meds ordered this encounter  Medications  . fluconazole (DIFLUCAN) 150 MG tablet    Sig: Take 1 tablet (150 mg total) by mouth every 3 (three) days.    Dispense:  2 tablet    Refill:  0    Tommi Rumps, MD [place]

## 2016-06-25 NOTE — Progress Notes (Signed)
Pre visit review using our clinic review tool, if applicable. No additional management support is needed unless otherwise documented below in the visit note. 

## 2016-07-02 ENCOUNTER — Telehealth: Payer: Self-pay | Admitting: Cardiology

## 2016-07-02 NOTE — Telephone Encounter (Signed)
Patient does not wish to fu. Deleting recall.

## 2016-07-08 DIAGNOSIS — M5136 Other intervertebral disc degeneration, lumbar region: Secondary | ICD-10-CM | POA: Diagnosis not present

## 2016-07-08 DIAGNOSIS — M62830 Muscle spasm of back: Secondary | ICD-10-CM | POA: Diagnosis not present

## 2016-07-08 DIAGNOSIS — M9903 Segmental and somatic dysfunction of lumbar region: Secondary | ICD-10-CM | POA: Diagnosis not present

## 2016-07-08 DIAGNOSIS — M9902 Segmental and somatic dysfunction of thoracic region: Secondary | ICD-10-CM | POA: Diagnosis not present

## 2016-07-14 ENCOUNTER — Ambulatory Visit (INDEPENDENT_AMBULATORY_CARE_PROVIDER_SITE_OTHER): Payer: PPO | Admitting: Podiatry

## 2016-07-14 ENCOUNTER — Encounter: Payer: Self-pay | Admitting: Podiatry

## 2016-07-14 DIAGNOSIS — Q828 Other specified congenital malformations of skin: Secondary | ICD-10-CM

## 2016-07-14 DIAGNOSIS — B351 Tinea unguium: Secondary | ICD-10-CM | POA: Diagnosis not present

## 2016-07-14 DIAGNOSIS — M79676 Pain in unspecified toe(s): Secondary | ICD-10-CM | POA: Diagnosis not present

## 2016-07-14 NOTE — Progress Notes (Signed)
She presents today with chief complaint of painful elongated toenails and corns and calluses.  Objective: Pulses are palpable bilateral. Nails are thick yellow dystrophic with mycotic distally via plantar elements are noted. Porokeratosis are also noted. No open lesions or wounds.  Assessment: Limb secondary to onychomycosis porokeratosis.  Plan: Debridement of all reactive hyperkeratoses. Debridement of toenails 1 through 5 bilateral.

## 2016-09-02 DIAGNOSIS — M9902 Segmental and somatic dysfunction of thoracic region: Secondary | ICD-10-CM | POA: Diagnosis not present

## 2016-09-02 DIAGNOSIS — M9903 Segmental and somatic dysfunction of lumbar region: Secondary | ICD-10-CM | POA: Diagnosis not present

## 2016-09-02 DIAGNOSIS — M5136 Other intervertebral disc degeneration, lumbar region: Secondary | ICD-10-CM | POA: Diagnosis not present

## 2016-09-02 DIAGNOSIS — M62830 Muscle spasm of back: Secondary | ICD-10-CM | POA: Diagnosis not present

## 2016-09-11 ENCOUNTER — Telehealth: Payer: Self-pay | Admitting: Family Medicine

## 2016-09-11 NOTE — Telephone Encounter (Signed)
Left pt message asking to call Allison back directly at [phone] to schedule AWV. Thanks! °

## 2016-09-15 ENCOUNTER — Ambulatory Visit (INDEPENDENT_AMBULATORY_CARE_PROVIDER_SITE_OTHER): Payer: PPO | Admitting: Podiatry

## 2016-09-15 DIAGNOSIS — M79676 Pain in unspecified toe(s): Secondary | ICD-10-CM

## 2016-09-15 DIAGNOSIS — B351 Tinea unguium: Secondary | ICD-10-CM | POA: Diagnosis not present

## 2016-09-15 NOTE — Progress Notes (Signed)
She presents today that she has no complaints but she is here for elongated toenails and calluses.  Objective: Vital signs are stable alert and oriented 3 pulses are palpable. Toenails are long thick yellow dystrophic mycotic painful palpation.  Assessment: Pain and limb secondary to onychomycosis.  Plan: Treatment of toenails 1 through 5 bilateral covered service secondary to pain.

## 2016-09-23 ENCOUNTER — Ambulatory Visit (INDEPENDENT_AMBULATORY_CARE_PROVIDER_SITE_OTHER): Payer: PPO | Admitting: Family Medicine

## 2016-09-23 ENCOUNTER — Encounter: Payer: Self-pay | Admitting: Family Medicine

## 2016-09-23 VITALS — BP 140/70 | HR 61 | Temp 97.8°F | Wt 186.2 lb

## 2016-09-23 DIAGNOSIS — I10 Essential (primary) hypertension: Secondary | ICD-10-CM | POA: Diagnosis not present

## 2016-09-23 DIAGNOSIS — E039 Hypothyroidism, unspecified: Secondary | ICD-10-CM

## 2016-09-23 DIAGNOSIS — K580 Irritable bowel syndrome with diarrhea: Secondary | ICD-10-CM | POA: Diagnosis not present

## 2016-09-23 DIAGNOSIS — D696 Thrombocytopenia, unspecified: Secondary | ICD-10-CM | POA: Diagnosis not present

## 2016-09-23 LAB — TSH: TSH: 3.91 u[IU]/mL (ref 0.35–4.50)

## 2016-09-23 NOTE — Progress Notes (Signed)
  Tommi Rumps, MD Phone: [phone]  Whitney Simmons is a 83 y.o. female who presents today for f/u.  HYPERTENSION  Disease Monitoring  Home BP Monitoring not checking Chest pain- no    Dyspnea- no Medications  Compliance-  Taking bystolic, losartan, lasix.  Edema- rare, intermittent, resolves with lasix, no orthopnea, no PND  HYPOTHYROIDISM Disease Monitoring Weight changes: gain  Skin Changes: no  Heat/Cold intolerance: no  Medication Monitoring Compliance:  Taking synthroid   Last TSH:   Lab Results  Component Value Date   TSH 3.36 03/25/2016   IBS: Patient notes long history of intermittent diarrhea preceded by sharp discomfort in her abdomen. Discomfort resolves after the diarrhea. Notes she will have normal bowel movements for several days at a time and then have several episodes of diarrhea. She does note onions make this worse. She notes no blood in her stool. No vomiting. She is status post cholecystectomy.  She has a history of thrombocytopenia. Most recent platelet check was normal. She notes no bleeding. It has been found that if we use the bigger tubing she has no issues with her platelets being low.  PMH: nonsmoker.   ROS see history of present illness  Objective  Physical Exam Vitals:   09/23/16 0805  BP: 140/70  Pulse: 61  Temp: 97.8 F (36.6 C)    BP Readings from Last 3 Encounters:  09/23/16 140/70  06/25/16 (!) 144/76  03/25/16 110/72   Wt Readings from Last 3 Encounters:  09/23/16 186 lb 3.2 oz (84.5 kg)  06/25/16 184 lb 3.2 oz (83.6 kg)  03/25/16 183 lb 6.4 oz (83.2 kg)    Physical Exam  Constitutional: No distress.  HENT:  Head: Normocephalic and atraumatic.  Mouth/Throat: Oropharynx is clear and moist. No oropharyngeal exudate.  Cardiovascular: Normal rate, regular rhythm and normal heart sounds.   Pulmonary/Chest: Effort normal and breath sounds normal.  Abdominal: Soft. Bowel sounds are normal. She exhibits no distension.  There is no tenderness. There is no rebound and no guarding.  Musculoskeletal: She exhibits no edema.  Neurological: She is alert. Gait normal.  Skin: Skin is warm and dry. She is not diaphoretic.     Assessment/Plan: Please see individual problem list.  Hypertension At goal for age. Continue current medications.  IBS (irritable bowel syndrome) Symptoms continue to be consistent with IBS. We will have her focus on a FODMAP diet. Did discuss potential for medications to treat this though she wants to defer this at this time.  Hypothyroidism Reports some weight gain. She has had some inactivity. Suspect it's related to that. We'll check a TSH. She'll continue Synthroid.  Thrombocytopenia (HCC) No bleeding. Most recent platelets in the normal range. Suspect related to the sides of tubing that we have used in the past. Was normal with larger tubing. She'll continue to inform the lab staff that she needs larger tubing for CBCs.   Orders Placed This Encounter  Procedures  . TSH    No orders of the defined types were placed in this encounter.   Tommi Rumps, MD [place]

## 2016-09-23 NOTE — Assessment & Plan Note (Signed)
Symptoms continue to be consistent with IBS. We will have her focus on a FODMAP diet. Did discuss potential for medications to treat this though she wants to defer this at this time.

## 2016-09-23 NOTE — Patient Instructions (Signed)
Nice to see you. We'll check her TSH today. Please focus on the diet information we provided. We will see you back in 6 months.

## 2016-09-23 NOTE — Assessment & Plan Note (Signed)
Reports some weight gain. She has had some inactivity. Suspect it's related to that. We'll check a TSH. She'll continue Synthroid.

## 2016-09-23 NOTE — Progress Notes (Signed)
Pre visit review using our clinic review tool, if applicable. No additional management support is needed unless otherwise documented below in the visit note. 

## 2016-09-23 NOTE — Assessment & Plan Note (Signed)
At goal for age. Continue current medications.

## 2016-09-23 NOTE — Assessment & Plan Note (Signed)
No bleeding. Most recent platelets in the normal range. Suspect related to the sides of tubing that we have used in the past. Was normal with larger tubing. She'll continue to inform the lab staff that she needs larger tubing for CBCs.

## 2016-09-25 ENCOUNTER — Telehealth: Payer: Self-pay | Admitting: Family Medicine

## 2016-09-25 NOTE — Telephone Encounter (Signed)
Pt's husband dropped off her handicapp renewal form to by signed. Paper is up front in Dr. Ellen Henri color folder.

## 2016-09-25 NOTE — Telephone Encounter (Signed)
Noted, on Dr.Sonnenbergs desk

## 2016-09-30 ENCOUNTER — Encounter: Payer: Self-pay | Admitting: Family Medicine

## 2016-10-14 DIAGNOSIS — M9902 Segmental and somatic dysfunction of thoracic region: Secondary | ICD-10-CM | POA: Diagnosis not present

## 2016-10-14 DIAGNOSIS — M62830 Muscle spasm of back: Secondary | ICD-10-CM | POA: Diagnosis not present

## 2016-10-14 DIAGNOSIS — M9903 Segmental and somatic dysfunction of lumbar region: Secondary | ICD-10-CM | POA: Diagnosis not present

## 2016-10-14 DIAGNOSIS — M5136 Other intervertebral disc degeneration, lumbar region: Secondary | ICD-10-CM | POA: Diagnosis not present

## 2016-10-20 DIAGNOSIS — H401121 Primary open-angle glaucoma, left eye, mild stage: Secondary | ICD-10-CM | POA: Diagnosis not present

## 2016-10-28 DIAGNOSIS — M62830 Muscle spasm of back: Secondary | ICD-10-CM | POA: Diagnosis not present

## 2016-10-28 DIAGNOSIS — M9902 Segmental and somatic dysfunction of thoracic region: Secondary | ICD-10-CM | POA: Diagnosis not present

## 2016-10-28 DIAGNOSIS — M9903 Segmental and somatic dysfunction of lumbar region: Secondary | ICD-10-CM | POA: Diagnosis not present

## 2016-10-28 DIAGNOSIS — M5136 Other intervertebral disc degeneration, lumbar region: Secondary | ICD-10-CM | POA: Diagnosis not present

## 2016-10-29 NOTE — Telephone Encounter (Signed)
Scheduled 03/27/17 °

## 2016-11-06 ENCOUNTER — Telehealth: Payer: Self-pay | Admitting: Family Medicine

## 2016-11-06 ENCOUNTER — Other Ambulatory Visit: Payer: Self-pay | Admitting: Family Medicine

## 2016-11-06 DIAGNOSIS — R0600 Dyspnea, unspecified: Secondary | ICD-10-CM

## 2016-11-06 MED ORDER — FUROSEMIDE 40 MG PO TABS: 40.0000 mg | ORAL_TABLET | Freq: Every day | ORAL | 1 refills | Status: DC

## 2016-11-06 NOTE — Telephone Encounter (Signed)
Pt called and stated that her pinky toe on her right foot is bruised, red, hot, swollen and spreading across her foot. Pt states that this has been happening for about a week but the spreading across the foot the last day. Sent call to [facility] Triage.  Call pt @ [phone]

## 2016-11-06 NOTE — Telephone Encounter (Signed)
See [facility] note, appt scheduled for tommorrow

## 2016-11-06 NOTE — Telephone Encounter (Signed)
Rx refilled for #90 with one refill.

## 2016-11-06 NOTE — Telephone Encounter (Signed)
Pt called and is requesting a refill on her furosemide (LASIX) 40 MG tablet. She took her last pill today. Please advise, thank you!  [place], [place]  Call pt @ [phone]

## 2016-11-06 NOTE — Telephone Encounter (Signed)
Dr. Lacinda Axon to see tomorrow, thanks

## 2016-11-06 NOTE — Telephone Encounter (Signed)
[facility] Patient Name: Whitney Simmons DOB: 1928-05-03 Initial Comment Caller states she's complaining of pinky toe swelled, and going to the top of her foot. Pain. Looks like bruising. Nurse Assessment Nurse: Markus Daft, RN, Sherre Poot Date/Time Eilene Ghazi Time): 11/06/2016 10:22:04 AM Confirm and document reason for call. If symptomatic, describe symptoms. ---Caller states she's c/o pinky toe with pain, redness and swelling, and swelling spreading to the top of her foot. Bruising outlines the toe. No injury. New pair of shoes that were a little tight a week ago. Does the patient have any new or worsening symptoms? ---Yes Will a triage be completed? ---Yes Related visit to physician within the last 2 weeks? ---No Does the PT have any chronic conditions? (i.e. diabetes, asthma, etc.) ---No Is this a behavioral health or substance abuse call? ---No Guidelines Guideline Title Affirmed Question Affirmed Notes Toe Pain [1] Swollen toe AND [2] no fever (Exceptions: just localized bump from bunion, corns, insect bite, sting) Final Disposition User See Physician within [place], RN, Sherre Poot Comments She has switched back to her old shoes, and will elevate her foot today. Appt made for tomorrow with Dr. Thersa Salt at 10:30 am. Nothing available with Dr. Caryl Bis. Referrals REFERRED TO PCP OFFICE Disagree/Comply: Comply

## 2016-11-07 ENCOUNTER — Encounter: Payer: Self-pay | Admitting: Family Medicine

## 2016-11-07 ENCOUNTER — Ambulatory Visit (INDEPENDENT_AMBULATORY_CARE_PROVIDER_SITE_OTHER): Payer: PPO | Admitting: Family Medicine

## 2016-11-07 DIAGNOSIS — S99921A Unspecified injury of right foot, initial encounter: Secondary | ICD-10-CM | POA: Diagnosis not present

## 2016-11-07 NOTE — Patient Instructions (Signed)
Elevation, compression.  Soaks are fine.  Call with concerns.  Take care  Dr. Lacinda Axon

## 2016-11-07 NOTE — Assessment & Plan Note (Signed)
New problem. Self limited/minor. Secondary to change in shoes. Nontender on exam. Supportive care. PRN Tylenol. Advised rest, elevation.

## 2016-11-07 NOTE — Progress Notes (Signed)
   Subjective:  Patient ID: Whitney Simmons, female    DOB: 11-24-1927  Age: 83 y.o. MRN: [id number]  CC: Foot/toe bruising  HPI:  83 year old female presents with the above complaint.  Patient states that she bought new shoes 10 days ago. 3-4 days ago she noticed swelling of her right 5th toe and bruising of the lateral aspect of the foot and of the distal dorsum of the foot. No reported pain. No recent fall.  She reports leg swelling (she has venous insufficiency). She has no other complaints at this time. She is concerned about "poor circulation".  Social Hx   Social History   Social History  . Marital status: Married    Spouse name: N/A  . Number of children: N/A  . Years of education: N/A   Social History Main Topics  . Smoking status: Never Smoker  . Smokeless tobacco: Never Used  . Alcohol use No  . Drug use: No  . Sexual activity: Not Asked   Other Topics Concern  . None   Social History Narrative   Lives in [place] at [place]. From [place]. Has daughter.      Work- Retired Pharmacist, hospital    Review of Systems  Constitutional: Negative.   Musculoskeletal:       Foot bruising, toe swelling.     Objective:  BP 136/60   Pulse 69   Temp 98 F (36.7 C) (Oral)   Wt 185 lb (83.9 kg)   SpO2 96%   BMI 29.41 kg/m   BP/Weight 11/07/2016 09/23/2016 50/53/9767  Systolic BP 341 937 902  Diastolic BP 60 70 76  Wt. (Lbs) 185 186.2 184.2  BMI 29.41 29.6 29.29    Physical Exam  Constitutional: She is oriented to person, place, and time. She appears well-developed. No distress.  Pulmonary/Chest: Effort normal.  Musculoskeletal:  Right foot - 5th toe swollen. Nontender. Bruising noted of the distal dorsum of the foot and the lateral foot (at the 5th metatarsal base).  Neurological: She is alert and oriented to person, place, and time.  Psychiatric: She has a normal mood and affect.  Vitals reviewed.   Lab Results  Component Value Date   WBC 7.6 04/15/2016   HGB 12.6 04/15/2016   HCT 38.1 04/15/2016   PLT 224.3 04/15/2016   GLUCOSE 85 06/25/2016   CHOL 197 01/23/2015   TRIG 154.0 (H) 01/23/2015   HDL 38.50 (L) 01/23/2015   LDLCALC 128 (H) 01/23/2015   ALT 8 03/25/2016   AST 14 03/25/2016   NA 140 06/25/2016   K 4.4 06/25/2016   CL 104 06/25/2016   CREATININE 1.15 06/25/2016   BUN 22 06/25/2016   CO2 28 06/25/2016   TSH 3.91 09/23/2016   HGBA1C 6.0 03/25/2016   MICROALBUR 0.3 05/12/2014    Assessment & Plan:   Problem List Items Addressed This Visit    Right foot injury    New problem. Self limited/minor. Secondary to change in shoes. Nontender on exam. Supportive care. PRN Tylenol. Advised rest, elevation.         Follow-up: PRN  [place]

## 2016-11-25 DIAGNOSIS — M62830 Muscle spasm of back: Secondary | ICD-10-CM | POA: Diagnosis not present

## 2016-11-25 DIAGNOSIS — M9903 Segmental and somatic dysfunction of lumbar region: Secondary | ICD-10-CM | POA: Diagnosis not present

## 2016-11-25 DIAGNOSIS — M5136 Other intervertebral disc degeneration, lumbar region: Secondary | ICD-10-CM | POA: Diagnosis not present

## 2016-11-25 DIAGNOSIS — M9902 Segmental and somatic dysfunction of thoracic region: Secondary | ICD-10-CM | POA: Diagnosis not present

## 2016-12-11 ENCOUNTER — Other Ambulatory Visit: Payer: Self-pay

## 2016-12-11 DIAGNOSIS — D0512 Intraductal carcinoma in situ of left breast: Secondary | ICD-10-CM

## 2016-12-17 ENCOUNTER — Ambulatory Visit: Payer: PPO | Admitting: Podiatry

## 2016-12-23 DIAGNOSIS — M9903 Segmental and somatic dysfunction of lumbar region: Secondary | ICD-10-CM | POA: Diagnosis not present

## 2016-12-23 DIAGNOSIS — M62830 Muscle spasm of back: Secondary | ICD-10-CM | POA: Diagnosis not present

## 2016-12-23 DIAGNOSIS — M5136 Other intervertebral disc degeneration, lumbar region: Secondary | ICD-10-CM | POA: Diagnosis not present

## 2016-12-23 DIAGNOSIS — M9902 Segmental and somatic dysfunction of thoracic region: Secondary | ICD-10-CM | POA: Diagnosis not present

## 2016-12-29 ENCOUNTER — Ambulatory Visit (INDEPENDENT_AMBULATORY_CARE_PROVIDER_SITE_OTHER): Payer: PPO | Admitting: Podiatry

## 2016-12-29 DIAGNOSIS — M79676 Pain in unspecified toe(s): Secondary | ICD-10-CM | POA: Diagnosis not present

## 2016-12-29 DIAGNOSIS — B351 Tinea unguium: Secondary | ICD-10-CM

## 2016-12-29 NOTE — Progress Notes (Signed)
Complaint:  Visit Type: Patient returns to my office for continued preventative foot care services. Complaint: Patient states" my nails have grown long and thick and become painful to walk and wear shoes". The patient presents for preventative foot care services. No changes to ROS.  Patient says she has healing callus on her right forefoot which she does not treated.  Podiatric Exam: Vascular: dorsalis pedis and posterior tibial pulses are diminished   bilateral. Capillary return is immediate. Temperature gradient is WNL. Skin turgor WNL  Sensorium: Normal Semmes Weinstein monofilament test. Normal tactile sensation bilaterally. Nail Exam: Pt has thick disfigured discolored nails with subungual debris noted bilateral entire nail hallux through fifth toenails.   Ulcer Exam: There is no evidence of ulcer or pre-ulcerative changes or infection. Orthopedic Exam: Muscle tone and strength are WNL. No limitations in general ROM. No crepitus or effusions noted. Hammer toe fifth right.  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 Skin: No Porokeratosis. No infection or ulcers  Diagnosis:  Onychomycosis, , Pain in right toe, pain in left toes  Treatment & Plan Procedures and Treatment: Consent by patient was obtained for treatment procedures. The patient understood the discussion of treatment and procedures well. All questions were answered thoroughly reviewed. Debridement of mycotic and hypertrophic toenails, 1 through 5 bilateral and clearing of subungual debris. No ulceration, no infection noted.  Return Visit-Office Procedure: Patient instructed to return to the office for a follow up visit 4 months for continued evaluation and treatment.    Gardiner Barefoot DPM

## 2017-01-01 ENCOUNTER — Telehealth: Payer: Self-pay | Admitting: *Deleted

## 2017-01-01 NOTE — Telephone Encounter (Signed)
Void  

## 2017-01-05 ENCOUNTER — Encounter: Payer: Self-pay | Admitting: Family Medicine

## 2017-01-05 ENCOUNTER — Ambulatory Visit (INDEPENDENT_AMBULATORY_CARE_PROVIDER_SITE_OTHER): Payer: PPO | Admitting: Family Medicine

## 2017-01-05 DIAGNOSIS — L84 Corns and callosities: Secondary | ICD-10-CM | POA: Insufficient documentation

## 2017-01-05 NOTE — Progress Notes (Signed)
  Tommi Rumps, MD Phone: [phone]  Whitney Simmons is a 83 y.o. female who presents today for same-day visit.  Patient notes chronic callus on right great toe ball of foot. Has been present for some time now. Has had it debrided and cut on by the podiatrist on several occasions. The third time she saw the podiatrist they said to soak her foot. She notes it did drain a little bit. Not significantly painful. No surrounding erythema. Does appear bruised underneath.  ROS see history of present illness  Objective  Physical Exam Vitals:   01/05/17 0821  BP: 120/66  Pulse: 63  Temp: 97.9 F (36.6 C)    BP Readings from Last 3 Encounters:  01/05/17 120/66  11/07/16 136/60  09/23/16 140/70   Wt Readings from Last 3 Encounters:  01/05/17 182 lb 3.2 oz (82.6 kg)  11/07/16 185 lb (83.9 kg)  09/23/16 186 lb 3.2 oz (84.5 kg)    Physical Exam  Constitutional: No distress.  Musculoskeletal:  Plantar surface ball of foot on great toe side with 3-4 cm callus with bruising underneath, on the fibular side of this there is what appears to have been a blister that has popped, no erythema, no drainage, no tenderness  Neurological: She is alert. Gait normal.  Skin: She is not diaphoretic.     Assessment/Plan: Please see individual problem list.  Callus of foot Callus of right foot. Has been evaluated by podiatry. Good blood flow on exam. Discussed she could continue to soak this. She'll monitor for signs of infection. She does not want anything done procedurally. If develops signs of infection she'll be reevaluated.  Tommi Rumps, MD [place]

## 2017-01-05 NOTE — Assessment & Plan Note (Signed)
Callus of right foot. Has been evaluated by podiatry. Good blood flow on exam. Discussed she could continue to soak this. She'll monitor for signs of infection. She does not want anything done procedurally. If develops signs of infection she'll be reevaluated.

## 2017-01-05 NOTE — Patient Instructions (Signed)
Nice to see you. You can continue to soak her foot. I would suggest continuing to monitor your foot. If you develop redness, drainage, pain, or any new symptoms please be reevaluated.

## 2017-01-16 ENCOUNTER — Ambulatory Visit (INDEPENDENT_AMBULATORY_CARE_PROVIDER_SITE_OTHER): Payer: PPO | Admitting: *Deleted

## 2017-01-16 VITALS — BP 126/74 | HR 63 | Temp 97.5°F | Resp 18

## 2017-01-16 DIAGNOSIS — R300 Dysuria: Secondary | ICD-10-CM

## 2017-01-16 LAB — POCT URINALYSIS DIPSTICK
Bilirubin, UA: NEGATIVE
Blood, UA: NEGATIVE
Glucose, UA: NEGATIVE
Ketones, UA: NEGATIVE
Leukocytes, UA: NEGATIVE
Nitrite, UA: NEGATIVE
Protein, UA: NEGATIVE
Spec Grav, UA: <=1.005 — AB (ref 1.010–1.025)
Urobilinogen, UA: 0.2 E.U./dL
pH, UA: 6 (ref 5.0–8.0)

## 2017-01-16 NOTE — Progress Notes (Signed)
Patient has ot been going more often but feels she is not urinating as much. Denies burning  Or dysuria. Had some itching a couple of weeks ago and decided she would get OTC urine test and see if she has infection test resulted as  positive.

## 2017-01-16 NOTE — Addendum Note (Signed)
Addended by: Arby Barrette on: 01/16/2017 12:35 PM   Modules accepted: Orders

## 2017-01-16 NOTE — Progress Notes (Signed)
Urine negative. Will send for culture due to patient request.  Whitney Salt DO [facility]

## 2017-01-17 LAB — URINE CULTURE: Organism ID, Bacteria: NO GROWTH

## 2017-01-29 ENCOUNTER — Other Ambulatory Visit: Payer: Self-pay | Admitting: Family Medicine

## 2017-02-03 ENCOUNTER — Ambulatory Visit
Admission: RE | Admit: 2017-02-03 | Discharge: 2017-02-03 | Disposition: A | Discharge status: Home / Self Care | Payer: PPO | Source: Ambulatory Visit | Attending: General Surgery | Admitting: General Surgery

## 2017-02-03 DIAGNOSIS — Z9889 Other specified postprocedural states: Secondary | ICD-10-CM | POA: Diagnosis not present

## 2017-02-03 DIAGNOSIS — D0512 Intraductal carcinoma in situ of left breast: Secondary | ICD-10-CM | POA: Diagnosis not present

## 2017-02-03 DIAGNOSIS — R928 Other abnormal and inconclusive findings on diagnostic imaging of breast: Secondary | ICD-10-CM | POA: Diagnosis not present

## 2017-02-09 ENCOUNTER — Ambulatory Visit (INDEPENDENT_AMBULATORY_CARE_PROVIDER_SITE_OTHER): Payer: PPO | Admitting: General Surgery

## 2017-02-09 VITALS — BP 124/72 | HR 57 | Resp 14 | Ht 67.0 in | Wt 179.0 lb

## 2017-02-09 DIAGNOSIS — Z853 Personal history of malignant neoplasm of breast: Secondary | ICD-10-CM | POA: Diagnosis not present

## 2017-02-09 NOTE — Progress Notes (Signed)
Patient ID: Whitney Simmons, female   DOB: Mar 20, 1928, 83 y.o.   MRN: [id number]  Chief Complaint  Patient presents with  . Follow-up    mammogram    HPI Whitney Simmons is a 83 y.o. female who presents for a breast follow up. The most recent mammogram was done on 02/03/17 .  Patient does perform regular self breast checks and gets regular mammograms done. Patient state no new breast issues. Patient's husband recently had a stroke in July-he is living in an assisted living facility-she stays in the apartments nearby.     HPI  Past Medical History:  Diagnosis Date  . Breast cancer ([place]) 2015   LT LUMPECTOMY  . 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 Declined postoperative radiation and antiestrogen therapy.  . Edema   . Hyperlipidemia   . Hypertension   . IBS (irritable bowel syndrome)   . Irregular heart beat   . Thyroid disease    s/p radioactive iodine ablation 30 years ago    Past Surgical History:  Procedure Laterality Date  . BREAST SURGERY Left 02-15-14   wide excision  . CATARACT EXTRACTION    . CHOLECYSTECTOMY    . TOE AMPUTATION     left 5th toe    Family History  Problem Relation Age of Onset  . Heart disease Mother   . Heart disease Maternal Aunt   . Breast cancer Neg Hx     Social History Social History  Substance Use Topics  . Smoking status: Never Smoker  . Smokeless tobacco: Never Used  . Alcohol use No    Allergies  Allergen Reactions  . Levaquin [Levofloxacin In D5w] Other (See Comments)    hallucinations  . Sulfa Antibiotics     Hallucinations     Current Outpatient Prescriptions  Medication Sig Dispense Refill  . aspirin 81 MG tablet Take 81 mg by mouth daily.    Marland Kitchen BYSTOLIC 5 MG tablet TAKE ONE TABLET BY MOUTH ONCE DAILY 90 tablet 2  . fluticasone (FLONASE) 50 MCG/ACT nasal spray Place 2 sprays into both nostrils daily. 16 g 6  . furosemide (LASIX) 40 MG tablet Take 1 tablet (40 mg total) by  mouth daily. 90 tablet 1  . levothyroxine (SYNTHROID, LEVOTHROID) 100 MCG tablet TAKE ONE TABLET BY MOUTH ONCE DAILY BEFORE BREAKFAST 90 tablet 2  . losartan (COZAAR) 50 MG tablet Take 1 tablet (50 mg total) by mouth daily. 90 tablet 3  . Multiple Vitamin (MULTIVITAMIN) tablet Take 1 tablet by mouth daily.    . timolol (BETIMOL) 0.5 % ophthalmic solution Place 1 drop into both eyes daily.     No current facility-administered medications for this visit.     Review of Systems Review of Systems  Blood pressure 124/72, pulse (!) 57, resp. rate 14, height 5\' 7"  (1.702 m), weight 179 lb (81.2 kg).  Physical Exam Physical Exam  Constitutional: She is oriented to person, place, and time. She appears well-developed and well-nourished.  Eyes: Conjunctivae are normal. No scleral icterus.  Neck: Neck supple. No thyromegaly present.  Cardiovascular: Normal rate, regular rhythm and normal heart sounds.   Pulmonary/Chest: Effort normal and breath sounds normal. Right breast exhibits no inverted nipple, no mass, no nipple discharge, no skin change and no tenderness. Left breast exhibits no inverted nipple, no mass, no nipple discharge, no skin change and no tenderness.    Lymphadenopathy:    She has no cervical adenopathy.  She has no axillary adenopathy.       Left: Supraclavicular adenopathy present.  Neurological: She is alert and oriented to person, place, and time.  Skin: Skin is warm and dry.    Data Reviewed Bilateral diagnostic mammograms dated 02/03/2017 were reviewed. Postsurgical changes. BI-RADS-2.  Assessment    Benign breast exam.    Plan          Patient to return in one year with bilateral diagnostic mammogram. Patient aware to call with any concerns or questions.   HPI, Physical Exam, Assessment and Plan have been scribed under the direction and in the presence of Hervey Ard, MD.  Verlene Mayer, CMA  I have completed the exam and reviewed the above  documentation for accuracy and completeness.  I agree with the above.  Haematologist has been used and any errors in dictation or transcription are unintentional.  Hervey Ard, M.D., F.A.C.S.    Robert Bellow 02/09/2017, 8:32 PM

## 2017-02-09 NOTE — Patient Instructions (Signed)
Follow up in one year with bilateral diagnostic mammogram.

## 2017-02-17 DIAGNOSIS — M62830 Muscle spasm of back: Secondary | ICD-10-CM | POA: Diagnosis not present

## 2017-02-17 DIAGNOSIS — M5136 Other intervertebral disc degeneration, lumbar region: Secondary | ICD-10-CM | POA: Diagnosis not present

## 2017-02-17 DIAGNOSIS — M9902 Segmental and somatic dysfunction of thoracic region: Secondary | ICD-10-CM | POA: Diagnosis not present

## 2017-02-17 DIAGNOSIS — M9903 Segmental and somatic dysfunction of lumbar region: Secondary | ICD-10-CM | POA: Diagnosis not present

## 2017-03-03 DIAGNOSIS — M9902 Segmental and somatic dysfunction of thoracic region: Secondary | ICD-10-CM | POA: Diagnosis not present

## 2017-03-03 DIAGNOSIS — M9903 Segmental and somatic dysfunction of lumbar region: Secondary | ICD-10-CM | POA: Diagnosis not present

## 2017-03-03 DIAGNOSIS — M5136 Other intervertebral disc degeneration, lumbar region: Secondary | ICD-10-CM | POA: Diagnosis not present

## 2017-03-03 DIAGNOSIS — M62830 Muscle spasm of back: Secondary | ICD-10-CM | POA: Diagnosis not present

## 2017-03-25 ENCOUNTER — Telehealth: Payer: Self-pay | Admitting: Family Medicine

## 2017-03-25 MED ORDER — NEBIVOLOL HCL 5 MG PO TABS: 5.0000 mg | ORAL_TABLET | Freq: Every day | ORAL | 0 refills | Status: DC

## 2017-03-25 NOTE — Telephone Encounter (Signed)
Please advise 

## 2017-03-25 NOTE — Telephone Encounter (Signed)
Sent to pharmacy 

## 2017-03-25 NOTE — Telephone Encounter (Signed)
Pt had to reschedule her appt because of a personal problem. She will see Dr. Caryl Bis 05/29/17. She will need her BYSTOLIC 5 MG tablet refilled.

## 2017-03-26 ENCOUNTER — Ambulatory Visit: Payer: PPO | Admitting: Family Medicine

## 2017-03-26 DIAGNOSIS — H6122 Impacted cerumen, left ear: Secondary | ICD-10-CM | POA: Diagnosis not present

## 2017-03-26 DIAGNOSIS — H903 Sensorineural hearing loss, bilateral: Secondary | ICD-10-CM | POA: Diagnosis not present

## 2017-03-26 DIAGNOSIS — H6980 Other specified disorders of Eustachian tube, unspecified ear: Secondary | ICD-10-CM | POA: Diagnosis not present

## 2017-03-27 ENCOUNTER — Ambulatory Visit: Payer: PPO

## 2017-03-27 ENCOUNTER — Ambulatory Visit: Payer: PPO | Admitting: Family Medicine

## 2017-04-14 DIAGNOSIS — M5134 Other intervertebral disc degeneration, thoracic region: Secondary | ICD-10-CM | POA: Diagnosis not present

## 2017-04-14 DIAGNOSIS — M62830 Muscle spasm of back: Secondary | ICD-10-CM | POA: Diagnosis not present

## 2017-04-14 DIAGNOSIS — M9902 Segmental and somatic dysfunction of thoracic region: Secondary | ICD-10-CM | POA: Diagnosis not present

## 2017-04-14 DIAGNOSIS — M9901 Segmental and somatic dysfunction of cervical region: Secondary | ICD-10-CM | POA: Diagnosis not present

## 2017-04-30 DIAGNOSIS — H401121 Primary open-angle glaucoma, left eye, mild stage: Secondary | ICD-10-CM | POA: Diagnosis not present

## 2017-05-04 ENCOUNTER — Ambulatory Visit: Payer: PPO | Admitting: Podiatry

## 2017-05-04 ENCOUNTER — Encounter: Payer: Self-pay | Admitting: Podiatry

## 2017-05-04 DIAGNOSIS — M79676 Pain in unspecified toe(s): Secondary | ICD-10-CM

## 2017-05-04 DIAGNOSIS — B351 Tinea unguium: Secondary | ICD-10-CM

## 2017-05-04 NOTE — Progress Notes (Addendum)
Complaint:  Visit Type: Patient returns to my office for continued preventative foot care services. Complaint: Patient states" my nails have grown long and thick and become painful to walk and wear shoes". The patient presents for preventative foot care services. No changes to ROS.  Patient says she has healing callus on her right forefoot which she does not treated.  Podiatric Exam: Vascular: dorsalis pedis and posterior tibial pulses are diminished   bilateral. Capillary return is immediate. Temperature gradient is WNL. Skin turgor WNL  Sensorium: Normal Semmes Weinstein monofilament test. Normal tactile sensation bilaterally. Nail Exam: Pt has thick disfigured discolored nails with subungual debris noted bilateral entire nail hallux through fifth toenails.   Ulcer Exam: There is no evidence of ulcer or pre-ulcerative changes or infection. Orthopedic Exam: Muscle tone and strength are WNL. No limitations in general ROM. No crepitus or effusions noted. Hammer toe fifth right.  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 Skin: No Porokeratosis. No infection or ulcers  Diagnosis:  Onychomycosis, , Pain in right toe, pain in left toes  Treatment & Plan Procedures and Treatment: Consent by patient was obtained for treatment procedures. The patient understood the discussion of treatment and procedures well. All questions were answered thoroughly reviewed. Debridement of mycotic and hypertrophic toenails, 1 through 5 bilateral and clearing of subungual debris. No ulceration, no infection noted. ABN signed for 2018 Return Visit-Office Procedure: Patient instructed to return to the office for a follow up visit 4 months for continued evaluation and treatment.     Gardiner Barefoot DPM

## 2017-05-20 DIAGNOSIS — H401121 Primary open-angle glaucoma, left eye, mild stage: Secondary | ICD-10-CM | POA: Diagnosis not present

## 2017-05-29 ENCOUNTER — Ambulatory Visit (INDEPENDENT_AMBULATORY_CARE_PROVIDER_SITE_OTHER): Payer: PPO

## 2017-05-29 ENCOUNTER — Ambulatory Visit: Payer: PPO | Admitting: Family Medicine

## 2017-05-29 ENCOUNTER — Encounter: Payer: Self-pay | Admitting: Family Medicine

## 2017-05-29 ENCOUNTER — Other Ambulatory Visit: Payer: Self-pay

## 2017-05-29 VITALS — BP 130/70 | HR 60 | Temp 97.8°F | Ht 65.0 in | Wt 180.0 lb

## 2017-05-29 VITALS — BP 130/70 | HR 60 | Temp 97.8°F | Resp 14 | Ht 65.0 in | Wt 180.0 lb

## 2017-05-29 DIAGNOSIS — D229 Melanocytic nevi, unspecified: Secondary | ICD-10-CM | POA: Diagnosis not present

## 2017-05-29 DIAGNOSIS — Z0000 Encounter for general adult medical examination without abnormal findings: Secondary | ICD-10-CM

## 2017-05-29 DIAGNOSIS — R7303 Prediabetes: Secondary | ICD-10-CM | POA: Diagnosis not present

## 2017-05-29 DIAGNOSIS — E039 Hypothyroidism, unspecified: Secondary | ICD-10-CM

## 2017-05-29 DIAGNOSIS — I10 Essential (primary) hypertension: Secondary | ICD-10-CM | POA: Diagnosis not present

## 2017-05-29 DIAGNOSIS — Z1331 Encounter for screening for depression: Secondary | ICD-10-CM | POA: Diagnosis not present

## 2017-05-29 DIAGNOSIS — E041 Nontoxic single thyroid nodule: Secondary | ICD-10-CM | POA: Diagnosis not present

## 2017-05-29 LAB — HEMOGLOBIN A1C: Hgb A1c MFr Bld: 6 % (ref 4.6–6.5)

## 2017-05-29 LAB — COMPREHENSIVE METABOLIC PANEL
ALT: 10 U/L (ref 0–35)
AST: 13 U/L (ref 0–37)
Albumin: 3.9 g/dL (ref 3.5–5.2)
Alkaline Phosphatase: 79 U/L (ref 39–117)
BUN: 25 mg/dL — ABNORMAL HIGH (ref 6–23)
CO2: 28 mEq/L (ref 19–32)
Calcium: 9.3 mg/dL (ref 8.4–10.5)
Chloride: 101 mEq/L (ref 96–112)
Creatinine, Ser: 1.18 mg/dL (ref 0.40–1.20)
GFR: 46 mL/min — ABNORMAL LOW (ref >60.00)
Glucose, Bld: 102 mg/dL — ABNORMAL HIGH (ref 70–99)
Potassium: 5 mEq/L (ref 3.5–5.1)
Sodium: 135 mEq/L (ref 135–145)
Total Bilirubin: 0.6 mg/dL (ref 0.2–1.2)
Total Protein: 7.3 g/dL (ref 6.0–8.3)

## 2017-05-29 LAB — TSH: TSH: 3.37 u[IU]/mL (ref 0.35–4.50)

## 2017-05-29 NOTE — Assessment & Plan Note (Signed)
Appears normal although given new appearance will refer to dermatology for evaluation and consideration of removal given that it gets irritated.

## 2017-05-29 NOTE — Assessment & Plan Note (Signed)
Check TSH.  Continue Synthroid.  Possible nodule noted thyroid.  Will obtain an ultrasound.

## 2017-05-29 NOTE — Assessment & Plan Note (Signed)
Well-controlled.  Check lab work.  Continue current regimen.

## 2017-05-29 NOTE — Patient Instructions (Addendum)
  Whitney Simmons , Thank you for taking time to come for your Medicare Wellness Visit. I appreciate your ongoing commitment to your health goals. Please review the following plan we discussed and let me know if I can assist you in the future.   These are the goals we discussed: Goals    . Increase physical activity     Walk for exercise    . INCREASE WATER INTAKE     Stay hydrated       This is a list of the screening recommended for you and due dates:  Health Maintenance  Topic Date Due  . Tetanus Vaccine  12/03/1946  . Flu Shot  Completed  . DEXA scan (bone density measurement)  Completed  . Pneumonia vaccines  Completed

## 2017-05-29 NOTE — Progress Notes (Signed)
Subjective:   TRISHA KEN is a 83 y.o. female who presents for Medicare Annual (Subsequent) preventive examination.  Review of Systems:  No ROS.  Medicare Wellness Visit. Additional risk factors are reflected in the social history.  Cardiac Risk Factors include: hypertension;advanced age (>76men, >81 women)     Objective:     Vitals: BP 130/70 (BP Location: Left Arm, Patient Position: Sitting, Cuff Size: Normal)   Pulse 60   Temp 97.8 F (36.6 C) (Oral)   Resp 14   Ht 5\' 5"  (1.651 m)   Wt 180 lb (81.6 kg)   SpO2 95%   BMI 29.95 kg/m   Body mass index is 29.95 kg/m.  Advanced Directives 05/29/2017  Does Patient Have a Medical Advance Directive? Yes  Type of Advance Directive Living will;Healthcare Power of Attorney  Does patient want to make changes to medical advance directive? No - Patient declined  Copy of [place] in Chart? No - copy requested    Tobacco Social History   Tobacco Use  Smoking Status Never Smoker  Smokeless Tobacco Never Used     Counseling given: Not Answered   Clinical Intake:  Pre-visit preparation completed: Yes  Pain : No/denies pain     Nutritional Status: BMI 25 -29 Overweight Diabetes: No  Activities of Daily Living: Independent Ambulation: Independent with device- listed below Home Assistive Devices/Equipment: Angela Burke (specify Type) Medication Administration: Independent Home Management: Independent     Do you feel unsafe in your current relationship?: No Do you feel physically threatened by others?: No Anyone hurting you at home, work, or school?: No Unable to ask?: No Information provided on Community resources: No  How often do you need to have someone help you when you read instructions, pamphlets, or other written materials from your doctor or pharmacy?: 1 - Never  Interpreter Needed?: No     Past Medical History:  Diagnosis Date  . Breast cancer ([place]) 2015   LT LUMPECTOMY  .  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 Declined postoperative radiation and antiestrogen therapy.  . Edema   . Hyperlipidemia   . Hypertension   . IBS (irritable bowel syndrome)   . Irregular heart beat   . Thyroid disease    s/p radioactive iodine ablation 30 years ago   Past Surgical History:  Procedure Laterality Date  . BREAST SURGERY Left 02-15-14   wide excision  . CATARACT EXTRACTION    . CHOLECYSTECTOMY    . TOE AMPUTATION     left 5th toe   Family History  Problem Relation Age of Onset  . Heart disease Mother   . Heart disease Maternal Aunt   . Breast cancer Neg Hx    Social History   Socioeconomic History  . Marital status: Married    Spouse name: None  . Number of children: None  . Years of education: None  . Highest education level: None  Social Needs  . Financial resource strain: None  . Food insecurity - worry: None  . Food insecurity - inability: None  . Transportation needs - medical: None  . Transportation needs - non-medical: None  Occupational History  . None  Tobacco Use  . Smoking status: Never Smoker  . Smokeless tobacco: Never Used  Substance and Sexual Activity  . Alcohol use: No  . Drug use: No  . Sexual activity: None  Other Topics Concern  . None  Social History Narrative  Lives in [place]. From [place]. Has daughter.      Work- Retired Copywriter, advertising Medications as of 05/29/2017  Medication Sig  . aspirin 81 MG tablet Take 81 mg by mouth daily.  . furosemide (LASIX) 40 MG tablet Take 1 tablet (40 mg total) by mouth daily.  Marland Kitchen levothyroxine (SYNTHROID, LEVOTHROID) 100 MCG tablet TAKE ONE TABLET BY MOUTH ONCE DAILY BEFORE BREAKFAST  . losartan (COZAAR) 50 MG tablet Take 1 tablet (50 mg total) by mouth daily.  . Multiple Vitamin (MULTIVITAMIN) tablet Take 1 tablet by mouth daily.  . nebivolol (BYSTOLIC) 5 MG tablet Take 1 tablet (5 mg total) by mouth  daily.  . timolol (BETIMOL) 0.5 % ophthalmic solution Place 1 drop into both eyes daily.  . [DISCONTINUED] FLUZONE HIGH-DOSE 0.5 ML injection inject 0.5 milliliter intramuscularly  . [DISCONTINUED] fluticasone (FLONASE) 50 MCG/ACT nasal spray Place 2 sprays into both nostrils daily.   No facility-administered encounter medications on file as of 05/29/2017.     Activities of Daily Living In your present state of health, do you have any difficulty performing the following activities: 05/29/2017  Hearing? Y  Comment HOH.   Vision? N  Difficulty concentrating or making decisions? N  Walking or climbing stairs? Y  Dressing or bathing? N  Doing errands, shopping? N  Preparing Food and eating ? N  Using the Toilet? N  In the past six months, have you accidently leaked urine? Y  Comment Managed with a daily pad  Do you have problems with loss of bowel control? N  Managing your Medications? N  Managing your Finances? N  Housekeeping or managing your Housekeeping? N  Some recent data might be hidden    Patient Care Team: Leone Haven, MD as PCP - General (Family Medicine) Bary Castilla Forest Gleason, MD (General Surgery)    Assessment:    This is a routine wellness examination for Aris. The goal of the wellness visit is to assist the patient how to close the gaps in care and create a preventative care plan for the patient.   The roster of all physicians providing medical care to patient is listed in the Snapshot section of the chart.  Taking calcium VIT D as appropriate/Osteoporosis reviewed.    Safety issues reviewed; Smoke and carbon monoxide detectors in the home. No firearms in the home.  Wears seatbelts when driving or riding with others. Patient does wear sunscreen or protective clothing when in direct sunlight. No violence in the home.  Depression- PHQ 2 &9 complete.  No signs/symptoms or verbal communication regarding little pleasure in doing things, feeling down, depressed  or hopeless. No changes in sleeping, energy, eating, concentrating.  No thoughts of self harm or harm towards others.  Time spent on this topic is 8 minutes.   Patient is alert, normal appearance, oriented to person/place/and time. Correctly identified the president of the [place], recall of 2/3 words, and performing simple calculations. Displays appropriate judgement and can read correct time from watch face.   No new identified risk were noted.  No failures at ADL's or IADL's.  Ambulates with cane and roll aid walker.  BMI- discussed the importance of a healthy diet, water intake and the benefits of aerobic exercise. Educational material provided.   24 hour diet recall: Low carb foods Meals prepared on site.  Daily fluid intake: 1 cups of caffeine, 3 cups of water  Dental- every 6 months.  Dr. Okey Dupre.  Eye-  Visual acuity not assessed per patient preference since they have regular follow up with the ophthalmologist.  Wears corrective lenses.  Sleep patterns- broken sleep; restless due to husbands health change in July. Naps during the day.  TDAP vaccine deferred per patient preference.  Follow up with insurance.  Educational material provided.  Patient Concerns: None at this time. Follow up with PCP as needed.  Exercise Activities and Dietary recommendations Current Exercise Habits: Home exercise routine, Type of exercise: walking;yoga, Time (Minutes): 15, Frequency (Times/Week): 2, Weekly Exercise (Minutes/Week): 30, Intensity: Mild  Goals    . Increase physical activity     Walk for exercise    . INCREASE WATER INTAKE     Stay hydrated      Fall Risk Fall Risk  05/29/2017 02/25/2016 01/23/2015 01/20/2014 01/26/2013  Falls in the past year? No No No No No  Risk for fall due to : - - - History of fall(s) -   Depression Screen PHQ 2/9 Scores 05/29/2017 02/25/2016 01/23/2015 01/20/2014  PHQ - 2 Score 0 0 0 0  PHQ- 9 Score 0 - - -     Cognitive Function MMSE - Mini Mental State  Exam 05/29/2017  Orientation to time 5  Orientation to Place 5  Registration 3  Attention/ Calculation 5  Recall 2  Language- name 2 objects 2  Language- repeat 1  Language- follow 3 step command 3  Language- read & follow direction 1  Write a sentence 1  Copy design 1  Total score 29        Immunization History  Administered Date(s) Administered  . Influenza Split 03/30/2012  . Influenza, High Dose Seasonal PF 03/18/2016  . Influenza,inj,Quad PF,6+ Mos 03/25/2014  . Influenza-Unspecified 05/03/2013, 04/26/2015, 03/31/2017  . Pneumococcal Conjugate-13 12/09/2013  . Pneumococcal Polysaccharide-23 01/27/2011   Screening Tests Health Maintenance  Topic Date Due  . TETANUS/TDAP  12/03/1946  . INFLUENZA VACCINE  Completed  . DEXA SCAN  Completed  . PNA vac Low Risk Adult  Completed      Plan:    End of life planning; Advance aging; Advanced directives discussed. Copy of current HCPOA/Living Will requested.    I have personally reviewed and noted the following in the patient's chart:   . Medical and social history . Use of alcohol, tobacco or illicit drugs  . Current medications and supplements . Functional ability and status . Nutritional status . Physical activity . Advanced directives . List of other physicians . Hospitalizations, surgeries, and ER visits in previous 12 months . Vitals . Screenings to include cognitive, depression, and falls . Referrals and appointments  In addition, I have reviewed and discussed with patient certain preventive protocols, quality metrics, and best practice recommendations. A written personalized care plan for preventive services as well as general preventive health recommendations were provided to patient.     Varney Biles, LPN  [id number]

## 2017-05-29 NOTE — Patient Instructions (Signed)
Nice to see you. We will get you set up for an ultrasound of your thyroid. We will refer you to dermatology. We will check lab work today.

## 2017-05-29 NOTE — Progress Notes (Signed)
  Tommi Rumps, MD Phone: [phone]  INAS AVENA is a 83 y.o. female who presents today for follow-up.  Patient notes a new nevus on her left cheek that has been there for a little less than a year.  It has not grown recently.  It does catch and gets irritated when she brushes against it.  Hypertension: Not checking at home.  She is taking Lasix, losartan, Bystolic.  No chest pain, shortness of breath, or edema.  Hypothyroidism: Taking Synthroid.  No skin changes.  No weight changes.  No heat or cold intolerance.  Does note dry skin and uses coconut oil for this.  Had hyperthyroidism previously and underwent radioactive iodine.  No history of thyroid nodule.  Social History   Tobacco Use  Smoking Status Never Smoker  Smokeless Tobacco Never Used     ROS see history of present illness  Objective  Physical Exam Vitals:   05/29/17 1108  BP: 130/70  Pulse: 60  Temp: 97.8 F (36.6 C)  SpO2: 95%    BP Readings from Last 3 Encounters:  05/29/17 130/70  05/29/17 130/70  02/09/17 124/72   Wt Readings from Last 3 Encounters:  05/29/17 180 lb (81.6 kg)  05/29/17 180 lb (81.6 kg)  02/09/17 179 lb (81.2 kg)    Physical Exam  Constitutional: No distress.  Neck: No thyromegaly present.  Possible nodule in left thyroid lobe  Cardiovascular: Normal rate, regular rhythm and normal heart sounds.  Pulmonary/Chest: Effort normal and breath sounds normal.  Musculoskeletal: She exhibits no edema.  Neurological: She is alert. Gait normal.  Skin: Skin is warm and dry. She is not diaphoretic.  Normal-appearing nevus left cheek     Assessment/Plan: Please see individual problem list.  Hypertension Well-controlled.  Check lab work.  Continue current regimen.  Hypothyroidism Check TSH.  Continue Synthroid.  Possible nodule noted thyroid.  Will obtain an ultrasound.  Nevus Appears normal although given new appearance will refer to dermatology for evaluation and  consideration of removal given that it gets irritated.  Aveeno for dry skin. She will get tetanus vaccine at the pharmacy.   Daneshia was seen today for follow-up.  Diagnoses and all orders for this visit:  Nevus -     Ambulatory referral to Dermatology  Essential hypertension -     Comp Met (CMET)  Hypothyroidism, unspecified type -     TSH  Prediabetes -     HgB A1c  Thyroid nodule -     US THYROID; Future    Orders Placed This Encounter  Procedures  . US THYROID    Standing Status:   Future    Standing Expiration Date:   07/29/2018    Order Specific Question:   Reason for Exam (SYMPTOM  OR DIAGNOSIS REQUIRED)    Answer:   possible thyroid nodule left lobe on exam    Order Specific Question:   Preferred imaging location?    Answer:   [facility]  . Comp Met (CMET)  . TSH  . HgB A1c  . Ambulatory referral to Dermatology    Referral Priority:   Routine    Referral Type:   Consultation    Referral Reason:   Specialty Services Required    Requested Specialty:   Dermatology    Number of Visits Requested:   1    No orders of the defined types were placed in this encounter.    Tommi Rumps, MD [place]

## 2017-06-01 ENCOUNTER — Encounter: Payer: Self-pay | Admitting: Family Medicine

## 2017-06-02 ENCOUNTER — Other Ambulatory Visit: Payer: Self-pay | Admitting: Family Medicine

## 2017-06-02 DIAGNOSIS — R0600 Dyspnea, unspecified: Secondary | ICD-10-CM

## 2017-06-03 ENCOUNTER — Encounter: Payer: Self-pay | Admitting: Family Medicine

## 2017-06-03 ENCOUNTER — Other Ambulatory Visit: Payer: Self-pay | Admitting: Family Medicine

## 2017-06-03 DIAGNOSIS — R0600 Dyspnea, unspecified: Secondary | ICD-10-CM

## 2017-06-03 MED ORDER — NEBIVOLOL HCL 5 MG PO TABS: 5.0000 mg | ORAL_TABLET | Freq: Every day | ORAL | 3 refills | Status: DC

## 2017-06-03 MED ORDER — LOSARTAN POTASSIUM 50 MG PO TABS: 50.0000 mg | ORAL_TABLET | Freq: Every day | ORAL | 3 refills | Status: DC

## 2017-06-03 MED ORDER — FUROSEMIDE 40 MG PO TABS: 40.0000 mg | ORAL_TABLET | Freq: Every day | ORAL | 1 refills | Status: DC

## 2017-06-03 NOTE — Telephone Encounter (Signed)
Pt called to check on status of her refill. Pt is almost out.

## 2017-06-05 ENCOUNTER — Ambulatory Visit: Payer: PPO

## 2017-06-12 ENCOUNTER — Ambulatory Visit: Payer: PPO

## 2017-06-16 ENCOUNTER — Ambulatory Visit
Admission: RE | Admit: 2017-06-16 | Discharge: 2017-06-16 | Disposition: A | Discharge status: Home / Self Care | Payer: PPO | Source: Ambulatory Visit | Attending: Family Medicine | Admitting: Family Medicine

## 2017-06-16 DIAGNOSIS — M9902 Segmental and somatic dysfunction of thoracic region: Secondary | ICD-10-CM | POA: Diagnosis not present

## 2017-06-16 DIAGNOSIS — E041 Nontoxic single thyroid nodule: Secondary | ICD-10-CM | POA: Diagnosis not present

## 2017-06-16 DIAGNOSIS — M9901 Segmental and somatic dysfunction of cervical region: Secondary | ICD-10-CM | POA: Diagnosis not present

## 2017-06-16 DIAGNOSIS — M62830 Muscle spasm of back: Secondary | ICD-10-CM | POA: Diagnosis not present

## 2017-06-16 DIAGNOSIS — M5134 Other intervertebral disc degeneration, thoracic region: Secondary | ICD-10-CM | POA: Diagnosis not present

## 2017-06-18 DIAGNOSIS — L820 Inflamed seborrheic keratosis: Secondary | ICD-10-CM | POA: Diagnosis not present

## 2017-06-18 DIAGNOSIS — L720 Epidermal cyst: Secondary | ICD-10-CM | POA: Diagnosis not present

## 2017-06-18 DIAGNOSIS — L84 Corns and callosities: Secondary | ICD-10-CM | POA: Diagnosis not present

## 2017-06-18 DIAGNOSIS — L821 Other seborrheic keratosis: Secondary | ICD-10-CM | POA: Diagnosis not present

## 2017-06-18 DIAGNOSIS — L578 Other skin changes due to chronic exposure to nonionizing radiation: Secondary | ICD-10-CM | POA: Diagnosis not present

## 2017-08-04 DIAGNOSIS — M62830 Muscle spasm of back: Secondary | ICD-10-CM | POA: Diagnosis not present

## 2017-08-04 DIAGNOSIS — M9901 Segmental and somatic dysfunction of cervical region: Secondary | ICD-10-CM | POA: Diagnosis not present

## 2017-08-04 DIAGNOSIS — M5134 Other intervertebral disc degeneration, thoracic region: Secondary | ICD-10-CM | POA: Diagnosis not present

## 2017-08-04 DIAGNOSIS — M9902 Segmental and somatic dysfunction of thoracic region: Secondary | ICD-10-CM | POA: Diagnosis not present

## 2017-09-07 ENCOUNTER — Ambulatory Visit: Payer: PPO | Admitting: Podiatry

## 2017-09-07 ENCOUNTER — Encounter: Payer: Self-pay | Admitting: Podiatry

## 2017-09-07 DIAGNOSIS — B351 Tinea unguium: Secondary | ICD-10-CM

## 2017-09-07 DIAGNOSIS — M79676 Pain in unspecified toe(s): Secondary | ICD-10-CM

## 2017-09-07 NOTE — Progress Notes (Signed)
Complaint:  Visit Type: Patient returns to my office for continued preventative foot care services. Complaint: Patient states" my nails have grown long and thick and become painful to walk and wear shoes". The patient presents for preventative foot care services. No changes to ROS.  Patient says she has healing callus on her right forefoot which she does not treated.  Podiatric Exam: Vascular: dorsalis pedis and posterior tibial pulses are diminished   bilateral. Capillary return is immediate. Temperature gradient is WNL. Skin turgor WNL  Sensorium: Normal Semmes Weinstein monofilament test. Normal tactile sensation bilaterally. Nail Exam: Pt has thick disfigured discolored nails with subungual debris noted bilateral entire nail hallux through fifth toenails.   Ulcer Exam: There is no evidence of ulcer or pre-ulcerative changes or infection. Orthopedic Exam: Muscle tone and strength are WNL. No limitations in general ROM. No crepitus or effusions noted. Hammer toe fifth right.  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 Skin: No Porokeratosis. No infection or ulcers  Diagnosis:  Onychomycosis, , Pain in right toe, pain in left toes  Treatment & Plan Procedures and Treatment: Consent by patient was obtained for treatment procedures. The patient understood the discussion of treatment and procedures well. All questions were answered thoroughly reviewed. Debridement of mycotic and hypertrophic toenails, 1 through 5 bilateral and clearing of subungual debris. No ulceration, no infection noted. ABN signed for 2019 Return Visit-Office Procedure: Patient instructed to return to the office for a follow up visit 4 months for continued evaluation and treatment.     Gardiner Barefoot DPM

## 2017-09-14 ENCOUNTER — Encounter: Payer: Self-pay | Admitting: Emergency Medicine

## 2017-09-14 ENCOUNTER — Emergency Department
Admission: EM | Admit: 2017-09-14 | Discharge: 2017-09-14 | Disposition: A | Discharge status: Home / Self Care | Payer: PPO | Attending: Emergency Medicine | Admitting: Emergency Medicine

## 2017-09-14 ENCOUNTER — Ambulatory Visit: Payer: Self-pay

## 2017-09-14 ENCOUNTER — Emergency Department: Payer: PPO

## 2017-09-14 DIAGNOSIS — R5383 Other fatigue: Secondary | ICD-10-CM | POA: Diagnosis not present

## 2017-09-14 DIAGNOSIS — Z79899 Other long term (current) drug therapy: Secondary | ICD-10-CM | POA: Insufficient documentation

## 2017-09-14 DIAGNOSIS — Z7982 Long term (current) use of aspirin: Secondary | ICD-10-CM | POA: Diagnosis not present

## 2017-09-14 DIAGNOSIS — R5381 Other malaise: Secondary | ICD-10-CM | POA: Diagnosis not present

## 2017-09-14 DIAGNOSIS — N183 Chronic kidney disease, stage 3 (moderate): Secondary | ICD-10-CM | POA: Insufficient documentation

## 2017-09-14 DIAGNOSIS — E890 Postprocedural hypothyroidism: Secondary | ICD-10-CM | POA: Insufficient documentation

## 2017-09-14 DIAGNOSIS — E039 Hypothyroidism, unspecified: Secondary | ICD-10-CM | POA: Diagnosis not present

## 2017-09-14 DIAGNOSIS — I129 Hypertensive chronic kidney disease with stage 1 through stage 4 chronic kidney disease, or unspecified chronic kidney disease: Secondary | ICD-10-CM | POA: Insufficient documentation

## 2017-09-14 DIAGNOSIS — R0602 Shortness of breath: Secondary | ICD-10-CM | POA: Insufficient documentation

## 2017-09-14 LAB — BASIC METABOLIC PANEL
Anion gap: 11 (ref 5–15)
BUN: 23 mg/dL — ABNORMAL HIGH (ref 6–20)
CO2: 21 mmol/L — ABNORMAL LOW (ref 22–32)
Calcium: 8.4 mg/dL — ABNORMAL LOW (ref 8.9–10.3)
Chloride: 101 mmol/L (ref 101–111)
Creatinine, Ser: 1.23 mg/dL — ABNORMAL HIGH (ref 0.44–1.00)
GFR calc Af Amer: 44 mL/min — ABNORMAL LOW (ref >60)
GFR calc non Af Amer: 38 mL/min — ABNORMAL LOW (ref >60)
Glucose, Bld: 122 mg/dL — ABNORMAL HIGH (ref 65–99)
Potassium: 3.9 mmol/L (ref 3.5–5.1)
Sodium: 133 mmol/L — ABNORMAL LOW (ref 135–145)

## 2017-09-14 LAB — URINALYSIS, COMPLETE (UACMP) WITH MICROSCOPIC
Bilirubin Urine: NEGATIVE
Glucose, UA: NEGATIVE mg/dL
Hgb urine dipstick: NEGATIVE
Ketones, ur: NEGATIVE mg/dL
Nitrite: NEGATIVE
Protein, ur: NEGATIVE mg/dL
Specific Gravity, Urine: 1.005 (ref 1.005–1.030)
pH: 5 (ref 5.0–8.0)

## 2017-09-14 LAB — CBC
HCT: 39 % (ref 35.0–47.0)
Hemoglobin: 12.7 g/dL (ref 12.0–16.0)
MCH: 29.4 pg (ref 26.0–34.0)
MCHC: 32.5 g/dL (ref 32.0–36.0)
MCV: 90.2 fL (ref 80.0–100.0)
Platelets: 221 10*3/uL (ref 150–440)
RBC: 4.33 MIL/uL (ref 3.80–5.20)
RDW: 13.9 % (ref 11.5–14.5)
WBC: 7.5 10*3/uL (ref 3.6–11.0)

## 2017-09-14 LAB — HEPATIC FUNCTION PANEL
ALT: 10 U/L — ABNORMAL LOW (ref 14–54)
AST: 21 U/L (ref 15–41)
Albumin: 3.6 g/dL (ref 3.5–5.0)
Alkaline Phosphatase: 71 U/L (ref 38–126)
Bilirubin, Direct: <0.1 mg/dL — ABNORMAL LOW (ref 0.1–0.5)
Total Bilirubin: 0.7 mg/dL (ref 0.3–1.2)
Total Protein: 7.1 g/dL (ref 6.5–8.1)

## 2017-09-14 LAB — BRAIN NATRIURETIC PEPTIDE: B Natriuretic Peptide: 83 pg/mL (ref 0.0–100.0)

## 2017-09-14 LAB — TROPONIN I: Troponin I: <0.03 ng/mL (ref <0.03)

## 2017-09-14 NOTE — Telephone Encounter (Signed)
Patient is currently at ER.

## 2017-09-14 NOTE — ED Provider Notes (Signed)
[facility] Emergency Department Provider Note  ____________________________________________   First MD Initiated Contact with Patient 09/14/17 1417     (approximate)  I have reviewed the triage vital signs and the nursing notes.   HISTORY  Chief Complaint Shortness of Breath and Fatigue   HPI Whitney Simmons is a 84 y.o. female who comes to the emergency department with 2-3 weeks of progressive shortness of breath and malaise.  Her symptoms have been insidious onset and slowly progressive.  They are slightly worse with exertion is slightly improved with rest.  She has no history of DVT or pulmonary embolism.  She denies chest pain.  She denies leg swelling.  She denies recent surgery travel or immobilization.  No fevers or chills.  No cough.  No recent upper respiratory symptoms.  She is able to lie completely flat.  She sleeps on 2 pillows.  She does not wake at night short of breath.  Past Medical History:  Diagnosis Date  . Breast cancer ([place]) 2015   LT LUMPECTOMY  . 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 Declined postoperative radiation and antiestrogen therapy.  . Edema   . Hyperlipidemia   . Hypertension   . IBS (irritable bowel syndrome)   . Irregular heart beat   . Thyroid disease    s/p radioactive iodine ablation 30 years ago    Patient Active Problem List   Diagnosis Date Noted  . Nevus 05/29/2017  . Callus of foot 01/05/2017  . Right foot injury 11/07/2016  . Thrombocytopenia ([place]) 11/06/2015  . Osteoporosis 03/07/2015  . Postmenopausal estrogen deficiency 01/23/2015  . Glaucoma 05/12/2014  . 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 Breast microcalcification, mammographic 01/31/2014  . IBS (irritable bowel syndrome) 12/09/2013  . Palpitations 09/01/2012  . Chronic kidney disease, stage III (moderate) ([place]) 08/18/2012  . Anxiety 08/02/2012  . Hypothyroidism 07/30/2012  .  Hyperlipidemia 07/30/2012  . Hypertension 07/30/2012    Past Surgical History:  Procedure Laterality Date  . BREAST SURGERY Left 02-15-14   wide excision  . CATARACT EXTRACTION    . CHOLECYSTECTOMY    . TOE AMPUTATION     left 5th toe    Prior to Admission medications   Medication Sig Start Date End Date Taking? Authorizing Provider  aspirin 81 MG tablet Take 81 mg by mouth daily.   Yes [provider]  brimonidine (ALPHAGAN P) 0.1 % SOLN Place 1 drop into both eyes 2 (two) times daily.   Yes [provider]  furosemide (LASIX) 40 MG tablet Take 1 tablet (40 mg total) by mouth daily. 06/03/17  Yes Leone Haven, MD  levothyroxine (SYNTHROID, LEVOTHROID) 100 MCG tablet TAKE ONE TABLET BY MOUTH ONCE DAILY BEFORE BREAKFAST 01/29/17  Yes Leone Haven, MD  losartan (COZAAR) 50 MG tablet Take 1 tablet (50 mg total) by mouth daily. 06/03/17  Yes Leone Haven, MD  Multiple Vitamin (MULTIVITAMIN) tablet Take 1 tablet by mouth daily.   Yes [provider]  nebivolol (BYSTOLIC) 5 MG tablet Take 1 tablet (5 mg total) by mouth daily. 06/03/17  Yes Leone Haven, MD  timolol (BETIMOL) 0.5 % ophthalmic solution Place 1 drop into both eyes daily.   Yes [provider]    Allergies Levaquin [levofloxacin in d5w] and Sulfa antibiotics  Family History  Problem Relation Age of Onset  . Heart disease Mother   . Heart disease Maternal  Aunt   . Breast cancer Neg Hx     Social History Social History   Tobacco Use  . Smoking status: Never Smoker  . Smokeless tobacco: Never Used  Substance Use Topics  . Alcohol use: No  . Drug use: No    Review of Systems Constitutional: No fever/chills Eyes: No visual changes. ENT: No sore throat. Cardiovascular: Denies chest pain. Respiratory: Positive for shortness of breath. Gastrointestinal: No abdominal pain.  No nausea, no vomiting.  No diarrhea.  No constipation. Genitourinary: Negative for  dysuria. Musculoskeletal: Negative for back pain. Skin: Negative for rash. Neurological: Negative for headaches, focal weakness or numbness.   ____________________________________________   PHYSICAL EXAM:  VITAL SIGNS: ED Triage Vitals [09/14/17 1149]  Enc Vitals Group     BP (!) 131/45     Pulse Rate (!) 56     Resp 18     Temp 98.7 F (37.1 C)     Temp Source Oral     SpO2 98 %     Weight 175 lb (79.4 kg)     Height 5\' 7"  (1.702 m)     Head Circumference      Peak Flow      Pain Score      Pain Loc      Pain Edu?      Excl. in [place]?     Constitutional: Alert and oriented x4 pleasant cooperative speaks in full clear sentences no diaphoresis Eyes: PERRL EOMI. Head: Atraumatic. Nose: No congestion/rhinnorhea. Mouth/Throat: No trismus Neck: No stridor.   Cardiovascular: Bradycardic rate, regular rhythm. Grossly normal heart sounds.  Good peripheral circulation. Respiratory: Normal respiratory effort.  No retractions. Lungs CTAB and moving good air Gastrointestinal: Soft nontender Musculoskeletal: No lower extremity edema   Neurologic:  Normal speech and language. No gross focal neurologic deficits are appreciated. Skin:  Skin is warm, dry and intact. No rash noted. Psychiatric: Mood and affect are normal. Speech and behavior are normal.    ____________________________________________   DIFFERENTIAL includes but not limited to  Acute coronary syndrome, pulmonary embolism, urinary tract infection, dehydration ____________________________________________   LABS (all labs ordered are listed, but only abnormal results are displayed)  Labs Reviewed  BASIC METABOLIC PANEL - Abnormal; Notable for the following components:      Result Value   Sodium 133 (*)    CO2 21 (*)    Glucose, Bld 122 (*)    BUN 23 (*)    Creatinine, Ser 1.23 (*)    Calcium 8.4 (*)    GFR calc non Af Amer 38 (*)    GFR calc Af Amer 44 (*)    All other components within normal limits    URINALYSIS, COMPLETE (UACMP) WITH MICROSCOPIC - Abnormal; Notable for the following components:   Color, Urine YELLOW (*)    APPearance CLOUDY (*)    Leukocytes, UA SMALL (*)    Bacteria, UA FEW (*)    Squamous Epithelial / LPF 6-30 (*)    All other components within normal limits  HEPATIC FUNCTION PANEL - Abnormal; Notable for the following components:   ALT 10 (*)    Bilirubin, Direct <0.1 (*)    All other components within normal limits  CBC  TROPONIN I  BRAIN NATRIURETIC PEPTIDE    Lab work reviewed by me with no signs of acute ischemia __________________________________________  EKG  ED ECG REPORT I, Darel Hong, the attending physician, personally viewed and interpreted this ECG.  Date: 09/14/2017 EKG Time:  Rate: 55 Rhythm: Sinus bradycardia with occasional PVCs QRS Axis: normal Intervals: normal ST/T Wave abnormalities: normal Narrative Interpretation: no evidence of acute ischemia  ____________________________________________  RADIOLOGY Chest x-ray reviewed by me with mild atelectasis but no infiltrate ____________________________________________   PROCEDURES  Procedure(s) performed: no  Procedures  Critical Care performed: no  Observation: no ____________________________________________   INITIAL IMPRESSION / ASSESSMENT AND PLAN / ED COURSE  Pertinent labs & imaging results that were available during my care of the patient were reviewed by me and considered in my medical decision making (see chart for details).  Differential on a short of breath and fatigue 84 year old woman is extremely broad but includes infectious versus ischemic.  Fortunately the patient's EKG has no signs of acute ischemia.  She was kept on monitor for multiple hours with no ectopy.  Urinalysis and chest x-ray are reassuring.  I had a lengthy discussion with the patient regarding the diagnostic uncertainty and the importance of close primary care follow-up.  The patient  verbalizes understanding and agreement with the plan.      ____________________________________________   FINAL CLINICAL IMPRESSION(S) / ED DIAGNOSES  Final diagnoses:  Other fatigue  Shortness of breath      NEW MEDICATIONS STARTED DURING THIS VISIT:  Discharge Medication List as of 09/14/2017  4:21 PM       Note:  This document was prepared using Dragon voice recognition software and may include unintentional dictation errors.     Darel Hong, MD 09/15/17 2333

## 2017-09-14 NOTE — ED Notes (Signed)
Pt states that over the last 2-3 weeks she has had increased shortness of breath and weakness - denies pain at this time - at this time pt O2 sat is 96%, respirations are even and unlabored - pt appears in NAD

## 2017-09-14 NOTE — Telephone Encounter (Signed)
Agree with urgent care evaluation.  Please follow-up with her to make sure she got looked at.  Thanks.

## 2017-09-14 NOTE — Discharge Instructions (Signed)
Fortunately today your blood work and your EKG were reassuring.  Please make an appointment to follow-up with your primary care physician later on this week return to the emergency department sooner for any concerns whatsoever.  It was a pleasure to take care of you today, and thank you for coming to our emergency department.  If you have any questions or concerns before leaving please ask the nurse to grab me and I'm more than happy to go through your aftercare instructions again.  If you were prescribed any opioid pain medication today such as Norco, Vicodin, Percocet, morphine, hydrocodone, or oxycodone please make sure you do not drive when you are taking this medication as it can alter your ability to drive safely.  If you have any concerns once you are home that you are not improving or are in fact getting worse before you can make it to your follow-up appointment, please do not hesitate to call [phone] and come back for further evaluation.  Darel Hong, MD  Results for orders placed or performed during the hospital encounter of 16/60/63  Basic metabolic panel  Result Value Ref Range   Sodium 133 (L) 135 - 145 mmol/L   Potassium 3.9 3.5 - 5.1 mmol/L   Chloride 101 101 - 111 mmol/L   CO2 21 (L) 22 - 32 mmol/L   Glucose, Bld 122 (H) 65 - 99 mg/dL   BUN 23 (H) 6 - 20 mg/dL   Creatinine, Ser 1.23 (H) 0.44 - 1.00 mg/dL   Calcium 8.4 (L) 8.9 - 10.3 mg/dL   GFR calc non Af Amer 38 (L) >60 mL/min   GFR calc Af Amer 44 (L) >60 mL/min   Anion gap 11 5 - 15  CBC  Result Value Ref Range   WBC 7.5 3.6 - 11.0 K/uL   RBC 4.33 3.80 - 5.20 MIL/uL   Hemoglobin 12.7 12.0 - 16.0 g/dL   HCT 39.0 35.0 - 47.0 %   MCV 90.2 80.0 - 100.0 fL   MCH 29.4 26.0 - 34.0 pg   MCHC 32.5 32.0 - 36.0 g/dL   RDW 13.9 11.5 - 14.5 %   Platelets 221 150 - 440 K/uL  Urinalysis, Complete w Microscopic  Result Value Ref Range   Color, Urine YELLOW (A) YELLOW   APPearance CLOUDY (A) CLEAR   Specific Gravity, Urine  1.005 1.005 - 1.030   pH 5.0 5.0 - 8.0   Glucose, UA NEGATIVE NEGATIVE mg/dL   Hgb urine dipstick NEGATIVE NEGATIVE   Bilirubin Urine NEGATIVE NEGATIVE   Ketones, ur NEGATIVE NEGATIVE mg/dL   Protein, ur NEGATIVE NEGATIVE mg/dL   Nitrite NEGATIVE NEGATIVE   Leukocytes, UA SMALL (A) NEGATIVE   RBC / HPF 0-5 0 - 5 RBC/hpf   WBC, UA 6-30 0 - 5 WBC/hpf   Bacteria, UA FEW (A) NONE SEEN   Squamous Epithelial / LPF 6-30 (A) NONE SEEN   Mucus PRESENT    Hyaline Casts, UA PRESENT   Troponin I  Result Value Ref Range   Troponin I <0.03 <0.03 ng/mL  Brain natriuretic peptide  Result Value Ref Range   B Natriuretic Peptide 83.0 0.0 - 100.0 pg/mL  Hepatic function panel  Result Value Ref Range   Total Protein 7.1 6.5 - 8.1 g/dL   Albumin 3.6 3.5 - 5.0 g/dL   AST 21 15 - 41 U/L   ALT 10 (L) 14 - 54 U/L   Alkaline Phosphatase 71 38 - 126 U/L   Total Bilirubin  0.7 0.3 - 1.2 mg/dL   Bilirubin, Direct <0.1 (L) 0.1 - 0.5 mg/dL   Indirect Bilirubin NOT CALCULATED 0.3 - 0.9 mg/dL   Dg Chest 2 View  Result Date: 09/14/2017 CLINICAL DATA:  Shortness of breath and weakness EXAM: CHEST - 2 VIEW COMPARISON:  July 30, 2012 FINDINGS: There is mild atelectatic change in the left base. Lungs elsewhere are clear. The heart size and pulmonary vascularity are normal. No adenopathy. No evident bone lesions. IMPRESSION: Mild left base atelectasis. No edema or consolidation. Heart size within normal limits. Electronically Signed   By: Lowella Grip III M.D.   On: 09/14/2017 14:47

## 2017-09-14 NOTE — Telephone Encounter (Signed)
fyi

## 2017-09-14 NOTE — Telephone Encounter (Signed)
Patient was informed to go to UC.  Due to no appointment availability. She stated she will go to UC.

## 2017-09-14 NOTE — ED Notes (Signed)
Pt up to bathroom to urinate. Family at bedside.

## 2017-09-14 NOTE — ED Triage Notes (Signed)
Pt c/o shortness of breath and fatigue for about three to four weeks.

## 2017-09-14 NOTE — Telephone Encounter (Signed)
Pt. Reports she has increased weakness over the past couple of weeks. Has increased shortness of breath with exertion. Only new medication are eye drops for glaucoma - will see her eye doctor this Wednesday. States she knows something is going on and would like to be seen as soon as possible - daughter is here visiting and would like to come with her.  Answer Assessment - Initial Assessment Questions 1. DESCRIPTION: "Describe how you are feeling."     Extremely weakness 2. SEVERITY: "How bad is it?"  "Can you stand and walk?"   - MILD - Feels weak or tired, but does not interfere with work, school or normal activities   - [place] to stand and walk; weakness interferes with work, school, or normal activities   - SEVERE - Unable to stand or walk     Moderate - gets short of breath easily 3. ONSET:  "When did the weakness begin?"     Started a few weeks ago 4. CAUSE: "What do you think is causing the weakness?"     Maybe her new eye drops 5. MEDICINES: "Have you recently started a new medicine or had a change in the amount of a medicine?"     New eye drops 6. OTHER SYMPTOMS: "Do you have any other symptoms?" (e.g., chest pain, fever, cough, SOB, vomiting, diarrhea, bleeding)     Short of breath with exertion 7. PREGNANCY: "Is there any chance you are pregnant?" "When was your last menstrual period?"     No  Protocols used: WEAKNESS (GENERALIZED) AND FATIGUE-A-AH

## 2017-09-15 ENCOUNTER — Encounter: Payer: Self-pay | Admitting: Family Medicine

## 2017-09-15 DIAGNOSIS — M62830 Muscle spasm of back: Secondary | ICD-10-CM | POA: Diagnosis not present

## 2017-09-15 DIAGNOSIS — M9902 Segmental and somatic dysfunction of thoracic region: Secondary | ICD-10-CM | POA: Diagnosis not present

## 2017-09-15 DIAGNOSIS — M5134 Other intervertebral disc degeneration, thoracic region: Secondary | ICD-10-CM | POA: Diagnosis not present

## 2017-09-15 DIAGNOSIS — M9901 Segmental and somatic dysfunction of cervical region: Secondary | ICD-10-CM | POA: Diagnosis not present

## 2017-09-15 NOTE — Telephone Encounter (Signed)
Please contact the patient and see if she could come in on Friday at [phone].  Please also check with me when you get this message regarding another slot that may work.

## 2017-09-16 DIAGNOSIS — H401121 Primary open-angle glaucoma, left eye, mild stage: Secondary | ICD-10-CM | POA: Diagnosis not present

## 2017-09-18 ENCOUNTER — Ambulatory Visit (INDEPENDENT_AMBULATORY_CARE_PROVIDER_SITE_OTHER): Payer: PPO | Admitting: Family Medicine

## 2017-09-18 ENCOUNTER — Encounter: Payer: Self-pay | Admitting: Family Medicine

## 2017-09-18 VITALS — BP 130/58 | HR 74 | Temp 97.5°F | Wt 185.8 lb

## 2017-09-18 DIAGNOSIS — R0600 Dyspnea, unspecified: Secondary | ICD-10-CM | POA: Diagnosis not present

## 2017-09-18 LAB — CBC
HCT: 34.8 % — ABNORMAL LOW (ref 36.0–46.0)
Hemoglobin: 11.4 g/dL — ABNORMAL LOW (ref 12.0–15.0)
MCHC: 32.6 g/dL (ref 30.0–36.0)
MCV: 90 fl (ref 78.0–100.0)
Platelets: 193.6 10*3/uL (ref 150.0–400.0)
RBC: 3.87 Mil/uL (ref 3.87–5.11)
RDW: 13.8 % (ref 11.5–15.5)
WBC: 7.3 10*3/uL (ref 4.0–10.5)

## 2017-09-18 LAB — BASIC METABOLIC PANEL
BUN: 26 mg/dL — ABNORMAL HIGH (ref 6–23)
CO2: 27 mEq/L (ref 19–32)
Calcium: 9 mg/dL (ref 8.4–10.5)
Chloride: 104 mEq/L (ref 96–112)
Creatinine, Ser: 1.23 mg/dL — ABNORMAL HIGH (ref 0.40–1.20)
GFR: 43.82 mL/min — ABNORMAL LOW (ref >60.00)
Glucose, Bld: 100 mg/dL — ABNORMAL HIGH (ref 70–99)
Potassium: 4.4 mEq/L (ref 3.5–5.1)
Sodium: 140 mEq/L (ref 135–145)

## 2017-09-18 LAB — TSH: TSH: 2.66 u[IU]/mL (ref 0.35–4.50)

## 2017-09-18 NOTE — Patient Instructions (Addendum)
Nice to see you. We will get you to see cardiology. We will recheck some lab work today. If you develop chest pain, persistent palpitations, worsening breathing issues, or any new or changing symptoms please be reevaluated.

## 2017-09-18 NOTE — Assessment & Plan Note (Addendum)
Patient has had progressive dyspnea over the last several months.  No significant acute changes.  Evaluation in the emergency department was unrevealing.  EKG today is reassuring.  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  Ambulatory oxygen saturation remains in the acceptable range.  She did appear to get mildly dyspneic after prolonged walking on ambulatory testing.  Wells score is 0. Unlikely VTE related.  Potentially could be cardiac related with increased ectopic beat burden or heart failure related though BNP was normal.  Anxiety could be contributing as well.  We will recheck lab work.  We will refer to cardiology.  She is given return precautions.

## 2017-09-18 NOTE — Progress Notes (Signed)
Tommi Rumps, MD Phone: [phone]  Whitney Simmons is a 84 y.o. female who presents today for follow-up.  Patient was seen in the emergency department earlier this week for gradually progressive dyspnea on exertion and fatigue.  This has been going on for a number of months though has progressively worsened.  No sudden changes.  She notes dyspnea can occur with exertion or at rest.  Sometimes improves with rest and other times does not.  She notes no chest pain.  She has had some mild cough that is nonproductive.  No fevers.  No chills.  No orthopnea or PND.  No unilateral leg swelling and she reports her chronic bilateral leg swelling has pretty much resolved.  No DVT or PE history.  No recent travel or surgeries.  She occasionally does feel some palpitations which she reports have been going on for decades and is the reason for her being on Bystolic.  She does note some stress regarding her husband who has been moved to assisted living.  She notes no depression though does note some anxiety.  She does not want any medication for anxiety.  Social History   Tobacco Use  Smoking Status Never Smoker  Smokeless Tobacco Never Used     ROS see history of present illness  Objective  Physical Exam Vitals:   09/18/17 1413  BP: (!) 130/58  Pulse: 74  Temp: (!) 97.5 F (36.4 C)  SpO2: 96%   Ambulatory O2 sat 91%  BP Readings from Last 3 Encounters:  09/18/17 (!) 130/58  09/14/17 108/77  05/29/17 130/70   Wt Readings from Last 3 Encounters:  09/18/17 185 lb 12.8 oz (84.3 kg)  09/14/17 175 lb (79.4 kg)  05/29/17 180 lb (81.6 kg)    Physical Exam  Constitutional: No distress.  HENT:  Mouth/Throat: Oropharynx is clear and moist. No oropharyngeal exudate.  Eyes: Pupils are equal, round, and reactive to light. Conjunctivae are normal.  Cardiovascular: Normal heart sounds.  Regular rate with regular rhythm and a few scattered ectopic beats  Pulmonary/Chest: Effort normal and  breath sounds normal.  Musculoskeletal:  Very minimal bilateral ankle edema  Neurological: She is alert. Gait normal.  Skin: Skin is warm and dry. She is not diaphoretic.   EKG: Normal sinus rhythm, rate 75, 2 ectopic ventricular beats noted, baseline wander noted in several leads, no ischemic changes noted  Assessment/Plan: Please see individual problem list.  Dyspnea Patient has had progressive dyspnea over the last several months.  No significant acute changes.  Evaluation in the emergency department was unrevealing.  EKG today is reassuring.  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  Ambulatory oxygen saturation remains in the acceptable range.  She did appear to get mildly dyspneic after prolonged walking on ambulatory testing.  Wells score is 0. Unlikely VTE related.  Potentially could be cardiac related with increased ectopic beat burden or heart failure related though BNP was normal.  Anxiety could be contributing as well.  We will recheck lab work.  We will refer to cardiology.  She is given return precautions.   Orders Placed This Encounter  Procedures  . Basic Metabolic Panel (BMET)  . CBC  . TSH  . Ambulatory referral to Cardiology    Referral Priority:   Routine    Referral Type:   Consultation    Referral Reason:   Specialty Services Required    Requested Specialty:   Cardiology    Number of Visits Requested:  1  . EKG 12-Lead    No orders of the defined types were placed in this encounter.    Tommi Rumps, MD [place]

## 2017-09-21 ENCOUNTER — Telehealth: Payer: Self-pay

## 2017-09-21 DIAGNOSIS — N179 Acute kidney failure, unspecified: Secondary | ICD-10-CM

## 2017-09-21 DIAGNOSIS — D649 Anemia, unspecified: Secondary | ICD-10-CM

## 2017-09-21 NOTE — Telephone Encounter (Signed)
-----   Message from Leone Haven, MD sent at 09/19/2017 10:02 AM EDT ----- Please let the patient know her kidney function is stable though reveals she is a little dehydrated. Her hemoglobin is slightly decreased from prior. These will need to be rechecked early this week. Please place an order for a CBC and bmet for the diagnosis of anemia and AKI respectively. Thanks

## 2017-09-22 ENCOUNTER — Ambulatory Visit (INDEPENDENT_AMBULATORY_CARE_PROVIDER_SITE_OTHER): Payer: PPO

## 2017-09-22 ENCOUNTER — Ambulatory Visit: Payer: PPO | Admitting: Cardiovascular Disease

## 2017-09-22 ENCOUNTER — Encounter: Payer: Self-pay | Admitting: Cardiovascular Disease

## 2017-09-22 ENCOUNTER — Other Ambulatory Visit (INDEPENDENT_AMBULATORY_CARE_PROVIDER_SITE_OTHER): Payer: PPO

## 2017-09-22 ENCOUNTER — Telehealth: Payer: Self-pay | Admitting: Radiology

## 2017-09-22 VITALS — BP 128/59 | HR 65 | Ht 67.0 in | Wt 182.0 lb

## 2017-09-22 DIAGNOSIS — N179 Acute kidney failure, unspecified: Secondary | ICD-10-CM | POA: Diagnosis not present

## 2017-09-22 DIAGNOSIS — R600 Localized edema: Secondary | ICD-10-CM | POA: Insufficient documentation

## 2017-09-22 DIAGNOSIS — R2681 Unsteadiness on feet: Secondary | ICD-10-CM | POA: Diagnosis not present

## 2017-09-22 DIAGNOSIS — F4322 Adjustment disorder with anxiety: Secondary | ICD-10-CM | POA: Diagnosis not present

## 2017-09-22 DIAGNOSIS — D649 Anemia, unspecified: Secondary | ICD-10-CM

## 2017-09-22 DIAGNOSIS — R0600 Dyspnea, unspecified: Secondary | ICD-10-CM | POA: Diagnosis not present

## 2017-09-22 DIAGNOSIS — R531 Weakness: Secondary | ICD-10-CM | POA: Diagnosis not present

## 2017-09-22 DIAGNOSIS — R002 Palpitations: Secondary | ICD-10-CM | POA: Diagnosis not present

## 2017-09-22 LAB — BASIC METABOLIC PANEL
BUN: 25 mg/dL — ABNORMAL HIGH (ref 6–23)
CO2: 27 mEq/L (ref 19–32)
Calcium: 8.9 mg/dL (ref 8.4–10.5)
Chloride: 101 mEq/L (ref 96–112)
Creatinine, Ser: 1.42 mg/dL — ABNORMAL HIGH (ref 0.40–1.20)
GFR: 37.12 mL/min — ABNORMAL LOW (ref >60.00)
Glucose, Bld: 112 mg/dL — ABNORMAL HIGH (ref 70–99)
Potassium: 3.9 mEq/L (ref 3.5–5.1)
Sodium: 138 mEq/L (ref 135–145)

## 2017-09-22 MED ORDER — FUROSEMIDE 40 MG PO TABS: 20.0000 mg | ORAL_TABLET | Freq: Every day | ORAL | 3 refills | Status: DC

## 2017-09-22 NOTE — Patient Instructions (Addendum)
Medication Instructions:   Please decrease the lasix down to 1/2 pill daily (20 mg daily)  Labwork:  No new labs needed  Testing/Procedures:  We will order a zio monitor for palpitations, irregular rhythm   Follow-Up: It was a pleasure seeing you in the office today. Please call us if you have new issues that need to be addressed before your next appt.  [phone]  Your physician wants you to follow-up in:   As needed  If you need a refill on your cardiac medications before your next appointment, please call your pharmacy.  For educational health videos Log in to : [email or web address] Or : [email or web address], password : [email or web address]

## 2017-09-22 NOTE — Addendum Note (Signed)
Addended by: Arby Barrette on: 09/22/2017 05:04 PM   Modules accepted: Orders

## 2017-09-22 NOTE — Progress Notes (Signed)
Cardiology Office Note  Date:  09/22/2017   ID:  Whitney Simmons, DOB 1927/08/14, MRN [id number]  PCP:  Leone Haven, MD   Chief Complaint  Patient presents with  . Other    Former patient of Dr. Bard Herbert. Follow up from [facility] ER; sob & fatigue. Pt. still c/o shortness of breath and weakness. Meds reviewed by the pt. verbally.     HPI:  Whitney Simmons is a 84 y.o. female  With a history of  Many years of shortness of breath/breathlessness Irregular heart beat Previously declined stress testing Chronic leg swelling Lives at [place] presents for follow-up of her irregular rhythm, weakness, shortness of breath  In follow-up today she reports having continued episodes of Weakness associated with irregular rhythm SOB seems to come on more when she has a regular rhythm Episodes happening more  Frequently  Stress at home Lives at [facility],  Husband with CVA, they do not live together, he has memory issues  In the ER 09/14/2017 SOB, fatigue Workup was negative and follow-up with cardiology today Hospital records reviewed with the patient in detail   Findings on holter 2017 Overall rhythm was sinus.  1322 isolated PVCs, 142 single PACs.  4 beat run of likely paroxysmal atrial tachycardia 129 BPM. No evidence of atrial fibrillation. No evidence of AV conduction disease.  [facility] personally reviewed by myself on todays visit Normal sinus rhythm rate 65 bpm poor R wave progression through the anterior precordial leads, left axis deviation  Echo normal in 2017 holter in 10/2015: PVCs,   CT 2017 Mild to moderate aortic athero     PMH:   has a past medical history of Breast cancer ([place]) (2015), Cancer ([place]) (02-15-14), Edema, Hyperlipidemia, Hypertension, IBS (irritable bowel syndrome), Irregular heart beat, and Thyroid disease.  PSH:    Past Surgical History:  Procedure Laterality Date  . BREAST SURGERY Left 02-15-14   wide excision  . CATARACT EXTRACTION    .  CHOLECYSTECTOMY    . TOE AMPUTATION     left 5th toe    Current Outpatient Medications  Medication Sig Dispense Refill  . aspirin 81 MG tablet Take 81 mg by mouth daily.    . furosemide (LASIX) 40 MG tablet Take 0.5 tablets (20 mg total) by mouth daily. 45 tablet 3  . levothyroxine (SYNTHROID, LEVOTHROID) 100 MCG tablet TAKE ONE TABLET BY MOUTH ONCE DAILY BEFORE BREAKFAST 90 tablet 2  . losartan (COZAAR) 50 MG tablet Take 1 tablet (50 mg total) by mouth daily. 90 tablet 3  . Multiple Vitamin (MULTIVITAMIN) tablet Take 1 tablet by mouth daily.    . nebivolol (BYSTOLIC) 5 MG tablet Take 1 tablet (5 mg total) by mouth daily. 90 tablet 3   No current facility-administered medications for this visit.      Allergies:   Levaquin [levofloxacin in d5w] and Sulfa antibiotics   Social History:  The patient  reports that she has never smoked. She has never used smokeless tobacco. She reports that she does not drink alcohol or use drugs.   Family History:   family history includes Heart disease in her maternal aunt and mother.    Review of Systems: Review of Systems  Constitutional: Positive for malaise/fatigue.  Respiratory: Positive for shortness of breath.   Cardiovascular: Positive for palpitations.  Gastrointestinal: Negative.   Musculoskeletal: Negative.   Neurological: Negative.   Psychiatric/Behavioral: The patient is nervous/anxious.   All other systems reviewed and are negative.  PHYSICAL EXAM: VS:  BP (!) 128/59 (BP Location: Right Arm, Patient Position: Sitting, Cuff Size: Large)   Pulse 65   Ht 5\' 7"  (1.702 m)   Wt 182 lb (82.6 kg)   BMI 28.51 kg/m  , BMI Body mass index is 28.51 kg/m. GEN: Well nourished, well developed, in no acute distress  HEENT: normal  Neck: no JVD, carotid bruits, or masses Cardiac: RRR; no murmurs, rubs, or gallops,no edema  Respiratory:  clear to auscultation bilaterally, normal work of breathing GI: soft, nontender, nondistended, +  BS MS: no deformity or atrophy  Skin: warm and dry, no rash Neuro:  Strength and sensation are intact Psych: euthymic mood, full affect    Recent Labs: 09/14/2017: ALT 10; B Natriuretic Peptide 83.0 09/18/2017: Hemoglobin 11.4; Platelets 193.6; TSH 2.66 09/22/2017: BUN 25; Creatinine, Ser 1.42; Potassium 3.9; Sodium 138    Lipid Panel Lab Results  Component Value Date   CHOL 197 01/23/2015   HDL 38.50 (L) 01/23/2015   LDLCALC 128 (H) 01/23/2015   TRIG 154.0 (H) 01/23/2015      Wt Readings from Last 3 Encounters:  09/22/17 182 lb (82.6 kg)  09/18/17 185 lb 12.8 oz (84.3 kg)  09/14/17 175 lb (79.4 kg)       ASSESSMENT AND PLAN:  Palpitations - Plan: EKG 12-Lead, LONG TERM MONITOR (3-14 DAYS) Long discussion concerning her symptoms Weakness, shortness of breath episodes with a regular rhythm Possibly secondary to underlying stressors with her husband, long-standing issue Unable to definitively exclude other issues such as arrhythmia and atrial fibrillation We have ordered event monitor for 2 weeks She reports symptoms are happening almost daily If event monitor is unrevealing, would likely recommend physical therapy for strength training, conditioning  Dyspnea, unspecified type - Appears prerenal on lab work will decrease Lasix down to 20 daily Event monitor as above to rule out arrhythmia She previously declined stress testing CT scan abdomen pelvis reviewed with up to moderate aortic atherosclerosis, distal coronary arteries with no coronary calcification Shortness of breath possibly secondary to deconditioning, may benefit from physical therapy and exercise  Lower extremity edema No significant pitting edema, will decrease Lasix dosing to 20 daily  Weakness Very nonspecific, 84 years old, walks with a walker, gait instability Will rule out arrhythmia as above seems to be a chronic issue  Gait instability Denies any recent falls Walking with a  walker  Adjustment disorder with anxious mood Somewhat tearful when talking about her husband Still has difficulty adjusting with him in skilled nursing facility Uncertain if she may benefit from low-dose SSRI.  Will defer to primary care  Disposition:   F/U as needed   Total encounter time more than 45 minutes  Greater than 50% was spent in counseling and coordination of care with the patient    Orders Placed This Encounter  Procedures  . LONG TERM MONITOR (3-14 DAYS)  . EKG 12-Lead     Signed, Esmond Plants, M.D., Ph.D. 09/22/2017  [place], [place]

## 2017-09-22 NOTE — Telephone Encounter (Signed)
Yes please. Thanks. 

## 2017-09-22 NOTE — Telephone Encounter (Signed)
Santiago Glad from [place] lab called and stated pt CBC clotted and was unable to be ran. Reordered future CBC. Would you like pt to come back in for CBC draw?

## 2017-09-23 ENCOUNTER — Other Ambulatory Visit: Payer: PPO

## 2017-09-23 NOTE — Telephone Encounter (Signed)
Please call pt and make lab appt to recollect CBC.

## 2017-09-23 NOTE — Telephone Encounter (Signed)
scheduled

## 2017-09-25 ENCOUNTER — Other Ambulatory Visit (INDEPENDENT_AMBULATORY_CARE_PROVIDER_SITE_OTHER): Payer: PPO

## 2017-09-25 DIAGNOSIS — D649 Anemia, unspecified: Secondary | ICD-10-CM

## 2017-09-25 LAB — CBC
HCT: 37.9 % (ref 36.0–46.0)
Hemoglobin: 12.4 g/dL (ref 12.0–15.0)
MCHC: 32.8 g/dL (ref 30.0–36.0)
MCV: 90.5 fl (ref 78.0–100.0)
Platelets: 218 10*3/uL (ref 150.0–400.0)
RBC: 4.19 Mil/uL (ref 3.87–5.11)
RDW: 14 % (ref 11.5–15.5)
WBC: 8.1 10*3/uL (ref 4.0–10.5)

## 2017-09-26 ENCOUNTER — Other Ambulatory Visit: Payer: Self-pay | Admitting: Family Medicine

## 2017-09-26 DIAGNOSIS — N179 Acute kidney failure, unspecified: Secondary | ICD-10-CM

## 2017-10-02 ENCOUNTER — Encounter: Payer: Self-pay | Admitting: Family Medicine

## 2017-10-02 ENCOUNTER — Other Ambulatory Visit (INDEPENDENT_AMBULATORY_CARE_PROVIDER_SITE_OTHER): Payer: PPO

## 2017-10-02 DIAGNOSIS — N179 Acute kidney failure, unspecified: Secondary | ICD-10-CM

## 2017-10-02 LAB — BASIC METABOLIC PANEL
BUN: 20 mg/dL (ref 6–23)
CO2: 27 mEq/L (ref 19–32)
Calcium: 9 mg/dL (ref 8.4–10.5)
Chloride: 103 mEq/L (ref 96–112)
Creatinine, Ser: 1.25 mg/dL — ABNORMAL HIGH (ref 0.40–1.20)
GFR: 43.01 mL/min — ABNORMAL LOW (ref >60.00)
Glucose, Bld: 121 mg/dL — ABNORMAL HIGH (ref 70–99)
Potassium: 4.4 mEq/L (ref 3.5–5.1)
Sodium: 137 mEq/L (ref 135–145)

## 2017-10-06 DIAGNOSIS — R002 Palpitations: Secondary | ICD-10-CM | POA: Diagnosis not present

## 2017-10-06 DIAGNOSIS — R0600 Dyspnea, unspecified: Secondary | ICD-10-CM | POA: Diagnosis not present

## 2017-10-07 DIAGNOSIS — H401121 Primary open-angle glaucoma, left eye, mild stage: Secondary | ICD-10-CM | POA: Diagnosis not present

## 2017-10-08 ENCOUNTER — Ambulatory Visit: Payer: PPO | Admitting: Family Medicine

## 2017-10-13 DIAGNOSIS — R002 Palpitations: Secondary | ICD-10-CM | POA: Diagnosis not present

## 2017-10-14 DIAGNOSIS — D3612 Benign neoplasm of peripheral nerves and autonomic nervous system, upper limb, including shoulder: Secondary | ICD-10-CM | POA: Diagnosis not present

## 2017-10-14 DIAGNOSIS — L578 Other skin changes due to chronic exposure to nonionizing radiation: Secondary | ICD-10-CM | POA: Diagnosis not present

## 2017-10-14 DIAGNOSIS — L821 Other seborrheic keratosis: Secondary | ICD-10-CM | POA: Diagnosis not present

## 2017-10-14 DIAGNOSIS — L820 Inflamed seborrheic keratosis: Secondary | ICD-10-CM | POA: Diagnosis not present

## 2017-10-19 ENCOUNTER — Telehealth: Payer: Self-pay

## 2017-10-19 ENCOUNTER — Telehealth: Payer: Self-pay | Admitting: Cardiovascular Disease

## 2017-10-19 ENCOUNTER — Ambulatory Visit: Payer: PPO | Admitting: Family Medicine

## 2017-10-19 NOTE — Telephone Encounter (Signed)
Please call with monitor results °

## 2017-10-19 NOTE — Telephone Encounter (Signed)
Noted  

## 2017-10-19 NOTE — Telephone Encounter (Signed)
FYI  Copied from [place] [id number]. Topic: Quick Communication - Appointment Cancellation >> Oct 19, 2017  8:11 AM Boyd Kerbs wrote: Patient called to cancel appointment scheduled for TODAY at 9:40. Patient has not rescheduled their appointment. She said she is waiting on the cardiologist and was to hear from them on Friday but did not. Will reschedule when she hears from them  Route to department's PEC pool.

## 2017-10-19 NOTE — Telephone Encounter (Signed)
Spoke with patient and advised that results are pending his review and that I would call her once those have been finalized. She verbalized understanding with no further questions.

## 2017-10-19 NOTE — Telephone Encounter (Signed)
fyi

## 2017-10-27 DIAGNOSIS — M9902 Segmental and somatic dysfunction of thoracic region: Secondary | ICD-10-CM | POA: Diagnosis not present

## 2017-10-27 DIAGNOSIS — M5134 Other intervertebral disc degeneration, thoracic region: Secondary | ICD-10-CM | POA: Diagnosis not present

## 2017-10-27 DIAGNOSIS — M62830 Muscle spasm of back: Secondary | ICD-10-CM | POA: Diagnosis not present

## 2017-10-27 DIAGNOSIS — M9901 Segmental and somatic dysfunction of cervical region: Secondary | ICD-10-CM | POA: Diagnosis not present

## 2017-11-02 MED ORDER — FUROSEMIDE 20 MG PO TABS: 20.0000 mg | ORAL_TABLET | Freq: Every day | ORAL | 3 refills | Status: DC

## 2017-11-02 MED ORDER — NEBIVOLOL HCL 10 MG PO TABS: 10.0000 mg | ORAL_TABLET | Freq: Every day | ORAL | 6 refills | Status: DC

## 2017-11-02 NOTE — Telephone Encounter (Signed)
Patient states her results were pending review with Dr. Rockey Situ and has not heard anything in a couple of weeks Calling to check on status - would like to speak with Pam Please call to discuss

## 2017-11-02 NOTE — Telephone Encounter (Signed)
Spoke with patient and she wanted to know what the delay was for her results. Apologized for the delay and that provider read her report this morning. Reviewed results and recommendations and she verbalized understanding. She requested that we make the medication changes and that she would follow up with her primary care provider at this point. She also requested that we send in lower dose of fluid pill because cutting it in half has been hard for her and she would like to take the bystolic once daily. Confirmed that I would make medication changes and to call back if any further questions. She was appreciative for the information with no further concerns at this time.

## 2017-11-02 NOTE — Addendum Note (Signed)
Addended by: Valora Corporal on: 11/02/2017 10:42 AM   Modules accepted: Orders

## 2017-11-05 DIAGNOSIS — H401121 Primary open-angle glaucoma, left eye, mild stage: Secondary | ICD-10-CM | POA: Diagnosis not present

## 2017-11-19 DIAGNOSIS — H401121 Primary open-angle glaucoma, left eye, mild stage: Secondary | ICD-10-CM | POA: Diagnosis not present

## 2017-11-24 ENCOUNTER — Ambulatory Visit (INDEPENDENT_AMBULATORY_CARE_PROVIDER_SITE_OTHER): Payer: PPO | Admitting: Family Medicine

## 2017-11-24 ENCOUNTER — Other Ambulatory Visit: Payer: Self-pay

## 2017-11-24 ENCOUNTER — Encounter: Payer: Self-pay | Admitting: Family Medicine

## 2017-11-24 VITALS — BP 130/64 | HR 74 | Temp 97.7°F | Wt 187.0 lb

## 2017-11-24 DIAGNOSIS — R0602 Shortness of breath: Secondary | ICD-10-CM

## 2017-11-24 DIAGNOSIS — I10 Essential (primary) hypertension: Secondary | ICD-10-CM | POA: Diagnosis not present

## 2017-11-24 DIAGNOSIS — M810 Age-related osteoporosis without current pathological fracture: Secondary | ICD-10-CM

## 2017-11-24 DIAGNOSIS — M25361 Other instability, right knee: Secondary | ICD-10-CM | POA: Diagnosis not present

## 2017-11-24 DIAGNOSIS — R2681 Unsteadiness on feet: Secondary | ICD-10-CM

## 2017-11-24 DIAGNOSIS — Z780 Asymptomatic menopausal state: Secondary | ICD-10-CM

## 2017-11-24 DIAGNOSIS — E039 Hypothyroidism, unspecified: Secondary | ICD-10-CM

## 2017-11-24 DIAGNOSIS — H409 Unspecified glaucoma: Secondary | ICD-10-CM

## 2017-11-24 LAB — BASIC METABOLIC PANEL
BUN: 17 mg/dL (ref 6–23)
CO2: 28 mEq/L (ref 19–32)
Calcium: 8.8 mg/dL (ref 8.4–10.5)
Chloride: 106 mEq/L (ref 96–112)
Creatinine, Ser: 1.31 mg/dL — ABNORMAL HIGH (ref 0.40–1.20)
GFR: 40.73 mL/min — ABNORMAL LOW (ref >60.00)
Glucose, Bld: 123 mg/dL — ABNORMAL HIGH (ref 70–99)
Potassium: 4.2 mEq/L (ref 3.5–5.1)
Sodium: 141 mEq/L (ref 135–145)

## 2017-11-24 NOTE — Assessment & Plan Note (Signed)
She will continue to follow with ophthalmology.  She will bring in her eyedrops at her next visit so we can get them in our system.

## 2017-11-24 NOTE — Assessment & Plan Note (Signed)
Due for DEXA scan.  This will be ordered.

## 2017-11-24 NOTE — Progress Notes (Signed)
Tommi Rumps, MD Phone: [phone]  Whitney Simmons is a 84 y.o. female who presents today for f/u.  CC: htn  HYPERTENSION  Disease Monitoring  Home BP Monitoring not checking Chest pain- no    Dyspnea- no Medications  Compliance-  Taking losartan, bystolic, lasix.  Edema- chronic puffiness, no orthopnea or PND Patient has been on Lasix for some time now for her chronic swelling.  This dose was decreased by cardiology after they found that she was prerenal.  She notes a little bit of fluid buildup since decreasing the dose and she will occasionally take a whole pill twice a week as opposed to half a pill the other days.  She was seen by cardiology for her dyspnea and palpitations.  She notes the dyspnea improved after they changed her atenolol eyedrops through her ophthalmologist.  She has not had any significant palpitations recently.  She did wear a long-term heart monitor revealing 17 episodes of tachycardia with the longest being 14 seconds and also some single isolated APCs and PVCs.  She has glaucoma.  She was on atenolol though that has been changed given her prior fatigue and dyspnea.  She notes she is on a new eyedrop and there following her every 2 weeks until they get her pressure correct.  HYPOTHYROIDISM Disease Monitoring Weight changes: no  Skin Changes: no Palpitations: no Heat/Cold intolerance: no  Medication Monitoring Compliance:  Taking synthroid   Last TSH:   Lab Results  Component Value Date   TSH 2.66 09/18/2017   She reports occasionally her right knee will give out on her.  She walks with a walker and is not having issues with falls related to this though potentially could fall. Due for dexa scan. Does take vitamin d.     Social History   Tobacco Use  Smoking Status Never Smoker  Smokeless Tobacco Never Used     ROS see history of present illness  Objective  Physical Exam Vitals:   11/24/17 1409  BP: 130/64  Pulse: 74  Temp: 97.7 F  (36.5 C)  SpO2: 96%    BP Readings from Last 3 Encounters:  11/24/17 130/64  09/22/17 (!) 128/59  09/18/17 (!) 130/58   Wt Readings from Last 3 Encounters:  11/24/17 187 lb (84.8 kg)  09/22/17 182 lb (82.6 kg)  09/18/17 185 lb 12.8 oz (84.3 kg)    Physical Exam  Constitutional: No distress.  Cardiovascular: Normal rate, regular rhythm and normal heart sounds.  Pulmonary/Chest: Effort normal and breath sounds normal.  Musculoskeletal: She exhibits no edema.  Neurological: She is alert.  Skin: Skin is warm and dry. She is not diaphoretic.  Right knee nontender with no swelling noted   Assessment/Plan: Please see individual problem list.  Hypertension Well-controlled.  Check BMP.  Dyspnea These symptoms have resolved with discontinuing her atenolol eyedrops.  I wonder if she was getting some systemic exposure to this and in combination with her Bystolic this was causing an issue.  She will monitor and if symptoms recur let us know.  Hypothyroidism Continue Synthroid.  Glaucoma She will continue to follow with ophthalmology.  She will bring in her eyedrops at her next visit so we can get them in our system.  Osteoporosis Due for DEXA scan.  This will be ordered.  Gait instability Physical therapy ordered.  I suspect possible arthritis in her right knee contributing to her right knee issues.  She will continue to use her walker.   Health Maintenance: DEXA scan  ordered.  Orders Placed This Encounter  Procedures  . DG Bone Density    Standing Status:   Future    Standing Expiration Date:   01/25/2019    Order Specific Question:   Reason for Exam (SYMPTOM  OR DIAGNOSIS REQUIRED)    Answer:   osteopenia, postmenopausal estrogen deficiency    Order Specific Question:   Preferred imaging location?    Answer:   [facility]  . Basic Metabolic Panel (BMET)  . Ambulatory referral to Physical Therapy    Referral Priority:   Routine    Referral Type:   Physical  Medicine    Referral Reason:   Specialty Services Required    Requested Specialty:   Physical Therapy    Number of Visits Requested:   1    No orders of the defined types were placed in this encounter.    Tommi Rumps, MD [place]

## 2017-11-24 NOTE — Assessment & Plan Note (Signed)
Well-controlled.  Check BMP. 

## 2017-11-24 NOTE — Patient Instructions (Signed)
Nice to see you. We will check lab work today and contact you with the results. 

## 2017-11-24 NOTE — Assessment & Plan Note (Signed)
Physical therapy ordered.  I suspect possible arthritis in her right knee contributing to her right knee issues.  She will continue to use her walker.

## 2017-11-24 NOTE — Assessment & Plan Note (Signed)
These symptoms have resolved with discontinuing her atenolol eyedrops.  I wonder if she was getting some systemic exposure to this and in combination with her Bystolic this was causing an issue.  She will monitor and if symptoms recur let us know.

## 2017-11-24 NOTE — Assessment & Plan Note (Signed)
Continue Synthroid °

## 2017-11-25 ENCOUNTER — Telehealth: Payer: Self-pay

## 2017-11-25 DIAGNOSIS — I10 Essential (primary) hypertension: Secondary | ICD-10-CM

## 2017-11-25 NOTE — Telephone Encounter (Signed)
-----   Message from Leone Haven, MD sent at 11/24/2017  5:23 PM EDT ----- Please let the patient know that her kidney function is relatively stable compared to previously.  She should try to stick to 20 mg of Lasix daily as her cardiologist advised.  Her blood sugar is mildly elevated likely related to her having not been fasting.  I would like to recheck her renal function in another month or so.  Please place an order for a BMP for a diagnosis of hypertension.  Thanks.

## 2017-12-03 DIAGNOSIS — H401121 Primary open-angle glaucoma, left eye, mild stage: Secondary | ICD-10-CM | POA: Diagnosis not present

## 2017-12-08 DIAGNOSIS — M9902 Segmental and somatic dysfunction of thoracic region: Secondary | ICD-10-CM | POA: Diagnosis not present

## 2017-12-08 DIAGNOSIS — M5134 Other intervertebral disc degeneration, thoracic region: Secondary | ICD-10-CM | POA: Diagnosis not present

## 2017-12-08 DIAGNOSIS — M9901 Segmental and somatic dysfunction of cervical region: Secondary | ICD-10-CM | POA: Diagnosis not present

## 2017-12-08 DIAGNOSIS — M62830 Muscle spasm of back: Secondary | ICD-10-CM | POA: Diagnosis not present

## 2017-12-14 ENCOUNTER — Other Ambulatory Visit: Payer: Self-pay | Admitting: Family Medicine

## 2017-12-23 ENCOUNTER — Other Ambulatory Visit: Payer: Self-pay

## 2017-12-23 DIAGNOSIS — D0512 Intraductal carcinoma in situ of left breast: Secondary | ICD-10-CM

## 2017-12-30 DIAGNOSIS — H401121 Primary open-angle glaucoma, left eye, mild stage: Secondary | ICD-10-CM | POA: Diagnosis not present

## 2018-01-08 ENCOUNTER — Telehealth: Payer: Self-pay | Admitting: Radiology

## 2018-01-08 DIAGNOSIS — I10 Essential (primary) hypertension: Secondary | ICD-10-CM

## 2018-01-08 NOTE — Telephone Encounter (Signed)
Pt coming in for labs Monday, please place future orders. Thank you 

## 2018-01-10 NOTE — Addendum Note (Signed)
Addended by: Caryl Bis Raliegh Scobie G on: 01/10/2018 04:07 PM   Modules accepted: Orders

## 2018-01-11 ENCOUNTER — Telehealth (INDEPENDENT_AMBULATORY_CARE_PROVIDER_SITE_OTHER): Payer: PPO

## 2018-01-11 ENCOUNTER — Ambulatory Visit: Payer: PPO | Admitting: Podiatry

## 2018-01-11 ENCOUNTER — Ambulatory Visit: Payer: PPO

## 2018-01-11 ENCOUNTER — Ambulatory Visit (INDEPENDENT_AMBULATORY_CARE_PROVIDER_SITE_OTHER): Payer: PPO | Admitting: Family Medicine

## 2018-01-11 ENCOUNTER — Encounter: Payer: Self-pay | Admitting: Family Medicine

## 2018-01-11 ENCOUNTER — Other Ambulatory Visit (INDEPENDENT_AMBULATORY_CARE_PROVIDER_SITE_OTHER): Payer: PPO

## 2018-01-11 VITALS — BP 116/78 | HR 66 | Temp 98.3°F | Resp 18

## 2018-01-11 DIAGNOSIS — R0602 Shortness of breath: Secondary | ICD-10-CM | POA: Diagnosis not present

## 2018-01-11 DIAGNOSIS — R0989 Other specified symptoms and signs involving the circulatory and respiratory systems: Secondary | ICD-10-CM | POA: Diagnosis not present

## 2018-01-11 DIAGNOSIS — R002 Palpitations: Secondary | ICD-10-CM | POA: Diagnosis not present

## 2018-01-11 DIAGNOSIS — F419 Anxiety disorder, unspecified: Secondary | ICD-10-CM | POA: Diagnosis not present

## 2018-01-11 DIAGNOSIS — I10 Essential (primary) hypertension: Secondary | ICD-10-CM | POA: Diagnosis not present

## 2018-01-11 LAB — BASIC METABOLIC PANEL
BUN: 21 mg/dL (ref 6–23)
CO2: 18 mEq/L — ABNORMAL LOW (ref 19–32)
Calcium: 9.5 mg/dL (ref 8.4–10.5)
Chloride: 106 mEq/L (ref 96–112)
Creatinine, Ser: 1.46 mg/dL — ABNORMAL HIGH (ref 0.40–1.20)
GFR: 35.93 mL/min — ABNORMAL LOW (ref >60.00)
Glucose, Bld: 111 mg/dL — ABNORMAL HIGH (ref 70–99)
Potassium: 4.2 mEq/L (ref 3.5–5.1)
Sodium: 138 mEq/L (ref 135–145)

## 2018-01-11 NOTE — Assessment & Plan Note (Signed)
I suspect this is related to anxiety and she has had an extensive work-up for this with no obvious cause found.  EKG is reassuring.  She did have potential crackles on lung exam and she opted to return tomorrow for the chest x-ray and CBC.  Discussed anxiety treatment and she will think about this.  Given return precautions.

## 2018-01-11 NOTE — Assessment & Plan Note (Signed)
She continues to have these intermittently.  She did undergo extended monitoring for this with occasional episodes of tachycardia and PACs and PVCs.  I agree with cardiology's interpretation that those findings were not occurring very frequently and would be less likely to lead to her symptoms.  Could consider increasing Bystolic to see if that would be beneficial though we will hold off on that at this time.

## 2018-01-11 NOTE — Assessment & Plan Note (Signed)
I suspect this is the source of her symptoms to some degree.  Discussed treatment options medication and therapy.  She would like to think about this first.  She will contact us within the next week or so and let us know what she decides.

## 2018-01-11 NOTE — Telephone Encounter (Signed)
Pt came in for labs and was triaged for SOB. No acute distress noted at this time. EKG completed. Spoke with PCP and placed pt on schedule for evaluation.

## 2018-01-11 NOTE — Progress Notes (Signed)
Tommi Rumps, MD Phone: [phone]  Whitney Simmons is a 84 y.o. female who presents today for same day visit.  CC: dyspnea, palpitations, anxiety  Patient has had chronic dyspnea and palpitations.  She feels like she cannot get a breath particularly when talking though it does also occur when she gets up and moves around.  She had an extensive evaluation while hospitalized and also saw cardiology in follow-up.  No obvious cause has been found.  She had an event monitor with some PACs and PVCs as well as some runs of tachycardia that did not last long.  It appears they questioned whether or not those findings could cause her symptoms.  They talked about increasing her Bystolic though she has remained on 5 mg.  She continues on Lasix.  She has no chest pain with this.  Not much swelling.  She does have quite a bit of anxiety.  She is worried about her husband who has dementia and is in memory care.  She is worried about her own health issues.  She does not have much support.  She notes no depression.  She has not been eating great.  She cannot lie flat due to sinus drainage though not due to breathing issues.  Social History   Tobacco Use  Smoking Status Never Smoker  Smokeless Tobacco Never Used     ROS see history of present illness  Objective  Physical Exam Vitals:   01/11/18 1111  BP: 116/78  Pulse: 66  Resp: 18  Temp: 98.3 F (36.8 C)  SpO2: 97%    BP Readings from Last 3 Encounters:  01/11/18 116/78  11/24/17 130/64  09/22/17 (!) 128/59   Wt Readings from Last 3 Encounters:  11/24/17 187 lb (84.8 kg)  09/22/17 182 lb (82.6 kg)  09/18/17 185 lb 12.8 oz (84.3 kg)    Physical Exam  Constitutional: No distress.  Cardiovascular: Normal rate, regular rhythm and normal heart sounds.  Pulmonary/Chest: Effort normal. No stridor. No respiratory distress. She has no wheezes.  Possible crackles in the right upper lung field  Musculoskeletal: She exhibits no edema.    Neurological: She is alert.  Skin: Skin is warm and dry. She is not diaphoretic.   EKG: Normal sinus rhythm, rate 68, no ischemic changes  Assessment/Plan: Please see individual problem list.  Anxiety I suspect this is the source of her symptoms to some degree.  Discussed treatment options medication and therapy.  She would like to think about this first.  She will contact us within the next week or so and let us know what she decides.  Dyspnea I suspect this is related to anxiety and she has had an extensive work-up for this with no obvious cause found.  EKG is reassuring.  She did have potential crackles on lung exam and she opted to return tomorrow for the chest x-ray and CBC.  Discussed anxiety treatment and she will think about this.  Given return precautions.   Palpitations She continues to have these intermittently.  She did undergo extended monitoring for this with occasional episodes of tachycardia and PACs and PVCs.  I agree with cardiology's interpretation that those findings were not occurring very frequently and would be less likely to lead to her symptoms.  Could consider increasing Bystolic to see if that would be beneficial though we will hold off on that at this time.   Orders Placed This Encounter  Procedures  . DG Chest 2 View    Standing Status:  Future    Number of Occurrences:   1    Standing Expiration Date:   03/15/2019    Order Specific Question:   Reason for Exam (SYMPTOM  OR DIAGNOSIS REQUIRED)    Answer:   chronic dyspnea, abnormal lung sounds right upper lung field    Order Specific Question:   Preferred imaging location?    Answer:   [facility] Specific Question:   Radiology Contrast Protocol - do NOT remove file path    Answer:   [email or web address]  . CBC    Standing Status:   Future    Standing Expiration Date:   01/12/2019    No orders of the defined types were placed in this  encounter.    Tommi Rumps, MD [place]

## 2018-01-11 NOTE — Patient Instructions (Signed)
Nice to see you. I believe your symptoms are anxiety related.  Please let us know if you would like to start medication or go to therapy. We will have you return tomorrow for x-ray and lab work.

## 2018-01-12 ENCOUNTER — Other Ambulatory Visit (INDEPENDENT_AMBULATORY_CARE_PROVIDER_SITE_OTHER): Payer: PPO

## 2018-01-12 ENCOUNTER — Ambulatory Visit (INDEPENDENT_AMBULATORY_CARE_PROVIDER_SITE_OTHER): Payer: PPO

## 2018-01-12 DIAGNOSIS — R0602 Shortness of breath: Secondary | ICD-10-CM

## 2018-01-12 DIAGNOSIS — R0989 Other specified symptoms and signs involving the circulatory and respiratory systems: Secondary | ICD-10-CM

## 2018-01-12 LAB — CBC
HCT: 38.8 % (ref 36.0–46.0)
Hemoglobin: 12.7 g/dL (ref 12.0–15.0)
MCHC: 32.8 g/dL (ref 30.0–36.0)
MCV: 89.3 fl (ref 78.0–100.0)
Platelets: 130 10*3/uL — ABNORMAL LOW (ref 150.0–400.0)
RBC: 4.35 Mil/uL (ref 3.87–5.11)
RDW: 13.9 % (ref 11.5–15.5)
WBC: 10.4 10*3/uL (ref 4.0–10.5)

## 2018-01-12 NOTE — Telephone Encounter (Signed)
Patient seen in the office. 

## 2018-01-13 ENCOUNTER — Encounter: Payer: Self-pay | Admitting: Family Medicine

## 2018-01-13 ENCOUNTER — Telehealth: Payer: Self-pay

## 2018-01-13 DIAGNOSIS — D696 Thrombocytopenia, unspecified: Secondary | ICD-10-CM

## 2018-01-13 NOTE — Addendum Note (Signed)
Addended by: Juanda Chance on: 01/13/2018 02:27 PM   Modules accepted: Orders

## 2018-01-13 NOTE — Telephone Encounter (Signed)
Copied from [place] [id number]. Topic: General - Other >> Jan 13, 2018 10:34 AM Yvette Rack wrote: Reason for CRM: Pt requesting update on lab results. Cb# [phone]

## 2018-01-13 NOTE — Telephone Encounter (Signed)
Patient notified and scheduled. Please release results so patient can see them on mychart

## 2018-01-13 NOTE — Telephone Encounter (Signed)
Please let the patient know that she was not anemic though her platelets are slightly low.  We should have her return in several weeks to recheck these.  Please place an order for a CBC for thrombocytopenia.  Thanks.

## 2018-01-13 NOTE — Telephone Encounter (Signed)
Please advise 

## 2018-01-14 ENCOUNTER — Telehealth: Payer: Self-pay

## 2018-01-14 DIAGNOSIS — N179 Acute kidney failure, unspecified: Secondary | ICD-10-CM

## 2018-01-14 NOTE — Telephone Encounter (Signed)
-----   Message from Leone Haven, MD sent at 01/13/2018  6:10 PM EDT ----- Please let the patient know that her kidney function slightly worse from prior.  I would suggest we recheck this later this week.  Please place an order for a BMP for AKI.  Thanks.

## 2018-01-19 DIAGNOSIS — M9901 Segmental and somatic dysfunction of cervical region: Secondary | ICD-10-CM | POA: Diagnosis not present

## 2018-01-19 DIAGNOSIS — M5134 Other intervertebral disc degeneration, thoracic region: Secondary | ICD-10-CM | POA: Diagnosis not present

## 2018-01-19 DIAGNOSIS — M9902 Segmental and somatic dysfunction of thoracic region: Secondary | ICD-10-CM | POA: Diagnosis not present

## 2018-01-19 DIAGNOSIS — M62830 Muscle spasm of back: Secondary | ICD-10-CM | POA: Diagnosis not present

## 2018-01-27 ENCOUNTER — Other Ambulatory Visit (INDEPENDENT_AMBULATORY_CARE_PROVIDER_SITE_OTHER): Payer: PPO

## 2018-01-27 DIAGNOSIS — N179 Acute kidney failure, unspecified: Secondary | ICD-10-CM

## 2018-01-27 DIAGNOSIS — D696 Thrombocytopenia, unspecified: Secondary | ICD-10-CM | POA: Diagnosis not present

## 2018-01-27 LAB — CBC
HCT: 38 % (ref 35.0–45.0)
Hemoglobin: 12.2 g/dL (ref 11.7–15.5)
MCH: 28.8 pg (ref 27.0–33.0)
MCHC: 32.1 g/dL (ref 32.0–36.0)
MCV: 89.6 fL (ref 80.0–100.0)
MPV: 12 fL (ref 7.5–12.5)
Platelets: 213 10*3/uL (ref 140–400)
RBC: 4.24 10*6/uL (ref 3.80–5.10)
RDW: 13.1 % (ref 11.0–15.0)
WBC: 8.3 10*3/uL (ref 3.8–10.8)

## 2018-01-27 LAB — BASIC METABOLIC PANEL
BUN: 18 mg/dL (ref 6–23)
CO2: 26 mEq/L (ref 19–32)
Calcium: 8.8 mg/dL (ref 8.4–10.5)
Chloride: 102 mEq/L (ref 96–112)
Creatinine, Ser: 1.27 mg/dL — ABNORMAL HIGH (ref 0.40–1.20)
GFR: 42.19 mL/min — ABNORMAL LOW (ref >60.00)
Glucose, Bld: 103 mg/dL — ABNORMAL HIGH (ref 70–99)
Potassium: 4.1 mEq/L (ref 3.5–5.1)
Sodium: 135 mEq/L (ref 135–145)

## 2018-01-27 NOTE — Addendum Note (Signed)
Addended by: Arby Barrette on: 01/27/2018 10:33 AM   Modules accepted: Orders

## 2018-02-01 ENCOUNTER — Encounter: Payer: Self-pay | Admitting: Podiatry

## 2018-02-01 ENCOUNTER — Ambulatory Visit: Payer: PPO | Admitting: Podiatry

## 2018-02-01 DIAGNOSIS — M79676 Pain in unspecified toe(s): Secondary | ICD-10-CM | POA: Diagnosis not present

## 2018-02-01 DIAGNOSIS — B351 Tinea unguium: Secondary | ICD-10-CM | POA: Diagnosis not present

## 2018-02-01 NOTE — Progress Notes (Signed)
Complaint:  Visit Type: Patient returns to my office for continued preventative foot care services. Complaint: Patient states" my nails have grown long and thick and become painful to walk and wear shoes". The patient presents for preventative foot care services. No changes to ROS.  Patient says she has healing callus on her right forefoot which she does not treated.  Podiatric Exam: Vascular: dorsalis pedis and posterior tibial pulses are diminished   bilateral. Capillary return is immediate. Temperature gradient is WNL. Skin turgor WNL  Sensorium: Normal Semmes Weinstein monofilament test. Normal tactile sensation bilaterally. Nail Exam: Pt has thick disfigured discolored nails with subungual debris noted bilateral entire nail hallux through fifth toenails.   Ulcer Exam: There is no evidence of ulcer or pre-ulcerative changes or infection. Orthopedic Exam: Muscle tone and strength are WNL. No limitations in general ROM. No crepitus or effusions noted.  Contracted  Hammer toe fifth right.  Amputation of fifth toe left foot . Skin: No Porokeratosis. No infection or ulcers.  Asymptomatic clavi second right.  Diagnosis:  Onychomycosis, , Pain in right toe, pain in left toes  Treatment & Plan Procedures and Treatment: Consent by patient was obtained for treatment procedures. The patient understood the discussion of treatment and procedures well. All questions were answered thoroughly reviewed. Debridement of mycotic and hypertrophic toenails, 1 through 5 bilateral and clearing of subungual debris. No ulceration, no infection noted. ABN signed for 2019 Return Visit-Office Procedure: Patient instructed to return to the office for a follow up visit 4 months for continued evaluation and treatment.     Gardiner Barefoot DPM

## 2018-02-11 ENCOUNTER — Ambulatory Visit
Admission: RE | Admit: 2018-02-11 | Discharge: 2018-02-11 | Disposition: A | Discharge status: Home / Self Care | Payer: PPO | Source: Ambulatory Visit | Attending: General Surgery | Admitting: General Surgery

## 2018-02-11 DIAGNOSIS — D0512 Intraductal carcinoma in situ of left breast: Secondary | ICD-10-CM | POA: Insufficient documentation

## 2018-02-11 DIAGNOSIS — R921 Mammographic calcification found on diagnostic imaging of breast: Secondary | ICD-10-CM | POA: Diagnosis not present

## 2018-02-18 ENCOUNTER — Ambulatory Visit: Payer: PPO | Admitting: General Surgery

## 2018-03-02 DIAGNOSIS — M5136 Other intervertebral disc degeneration, lumbar region: Secondary | ICD-10-CM | POA: Diagnosis not present

## 2018-03-02 DIAGNOSIS — M9902 Segmental and somatic dysfunction of thoracic region: Secondary | ICD-10-CM | POA: Diagnosis not present

## 2018-03-02 DIAGNOSIS — M5134 Other intervertebral disc degeneration, thoracic region: Secondary | ICD-10-CM | POA: Diagnosis not present

## 2018-03-02 DIAGNOSIS — M9903 Segmental and somatic dysfunction of lumbar region: Secondary | ICD-10-CM | POA: Diagnosis not present

## 2018-03-18 ENCOUNTER — Encounter: Payer: Self-pay | Admitting: General Surgery

## 2018-03-18 ENCOUNTER — Ambulatory Visit (INDEPENDENT_AMBULATORY_CARE_PROVIDER_SITE_OTHER): Payer: PPO | Admitting: General Surgery

## 2018-03-18 VITALS — BP 142/70 | HR 68 | Resp 14 | Ht 66.0 in | Wt 175.0 lb

## 2018-03-18 DIAGNOSIS — D0512 Intraductal carcinoma in situ of left breast: Secondary | ICD-10-CM | POA: Diagnosis not present

## 2018-03-18 NOTE — Progress Notes (Signed)
Patient ID: Whitney Simmons, female   DOB: [birth year], 85 y.o.   MRN: [id number]  Chief Complaint  Patient presents with  . Follow-up    HPI Whitney Simmons is a 84 y.o. female who presents for a breast evaluation. The most recent mammogram was done on  02/11/2018.  Patient does perform regular self breast checks and gets regular mammograms done.    The patient's husband still remains in the assisted living section of [facility].  The patient remains in independent living.  HPI  Past Medical History:  Diagnosis Date  . Breast cancer ([place]) 2015   LT LUMPECTOMY  . 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 Declined postoperative radiation and antiestrogen therapy.  . Edema   . Hyperlipidemia   . Hypertension   . IBS (irritable bowel syndrome)   . Irregular heart beat   . Thyroid disease    s/p radioactive iodine ablation 30 years ago    Past Surgical History:  Procedure Laterality Date  . BREAST LUMPECTOMY Left 2015  . BREAST SURGERY Left 02-15-14   wide excision  . CATARACT EXTRACTION    . CHOLECYSTECTOMY    . TOE AMPUTATION     left 5th toe    Family History  Problem Relation Age of Onset  . Heart disease Mother   . Heart disease Maternal Aunt   . Breast cancer Neg Hx     Social History Social History   Tobacco Use  . Smoking status: Never Smoker  . Smokeless tobacco: Never Used  Substance Use Topics  . Alcohol use: No  . Drug use: No    Allergies  Allergen Reactions  . Levaquin [Levofloxacin In D5w] Other (See Comments)    hallucinations  . Sulfa Antibiotics     Hallucinations     Current Outpatient Medications  Medication Sig Dispense Refill  . aspirin 81 MG tablet Take 81 mg by mouth daily.    . furosemide (LASIX) 20 MG tablet Take 1 tablet (20 mg total) by mouth daily. 90 tablet 3  . levothyroxine (SYNTHROID, LEVOTHROID) 100 MCG tablet TAKE 1 TABLET BY MOUTH ONCE DAILY BEFORE BREAKFAST 90 tablet 2  .  losartan (COZAAR) 50 MG tablet Take 1 tablet (50 mg total) by mouth daily. 90 tablet 3  . Multiple Vitamin (MULTIVITAMIN) tablet Take 1 tablet by mouth daily.    . nebivolol (BYSTOLIC) 10 MG tablet Take 1 tablet (10 mg total) by mouth daily. 30 tablet 6   No current facility-administered medications for this visit.     Review of Systems Review of Systems  Constitutional: Negative.   Respiratory: Negative.   Cardiovascular: Negative.     Blood pressure (!) 142/70, pulse 68, resp. rate 14, height 5\' 6"  (1.676 m), weight 175 lb (79.4 kg).  Physical Exam Physical Exam  Constitutional: She is oriented to person, place, and time. She appears well-developed and well-nourished.  Eyes: Conjunctivae are normal. No scleral icterus.  Neck: Neck supple.  Cardiovascular: Normal rate, regular rhythm and normal heart sounds.  Pulmonary/Chest: Effort normal and breath sounds normal. Right breast exhibits no inverted nipple, no mass, no nipple discharge, no skin change and no tenderness. Left breast exhibits no inverted nipple, no mass, no nipple discharge, no skin change and no tenderness.    Lymphadenopathy:    She has no cervical adenopathy.    She has no axillary adenopathy.  Neurological: She is alert and oriented to person, place, and  time.  Skin: Skin is warm and dry.    Data Reviewed Bilateral diagnostic mammogram dated February 11, 2018 was reviewed.  Postsurgical changes.  BI-RADS-2.  Assessment    Doing well now 4 years status post breast conservation for an intermediate-grade DCIS.    Plan  The patient has been asked to return to the office in one year with a bilateral diagnostic mammogram.The patient is aware to call back for any questions or concerns.  The patient had previously declined adjuvant antiestrogen therapy or radiation therapy.   HPI, Physical Exam, Assessment and Plan have been scribed under the direction and in the presence of Hervey Ard, MD.  Gaspar Cola, CMA  I have completed the exam and reviewed the above documentation for accuracy and completeness.  I agree with the above.  Haematologist has been used and any errors in dictation or transcription are unintentional.  Hervey Ard, M.D., F.A.C.S.  Forest Gleason Patriece Archbold 03/18/2018, 9:30 PM

## 2018-03-18 NOTE — Patient Instructions (Signed)
The patient has been asked to return to the office in one year with a bilateral diagnostic mammogram.The patient is aware to call back for any questions or concerns. 

## 2018-03-30 DIAGNOSIS — H401121 Primary open-angle glaucoma, left eye, mild stage: Secondary | ICD-10-CM | POA: Diagnosis not present

## 2018-04-06 DIAGNOSIS — Z961 Presence of intraocular lens: Secondary | ICD-10-CM | POA: Diagnosis not present

## 2018-04-13 DIAGNOSIS — M9902 Segmental and somatic dysfunction of thoracic region: Secondary | ICD-10-CM | POA: Diagnosis not present

## 2018-04-13 DIAGNOSIS — M5134 Other intervertebral disc degeneration, thoracic region: Secondary | ICD-10-CM | POA: Diagnosis not present

## 2018-04-13 DIAGNOSIS — M9903 Segmental and somatic dysfunction of lumbar region: Secondary | ICD-10-CM | POA: Diagnosis not present

## 2018-04-13 DIAGNOSIS — M5136 Other intervertebral disc degeneration, lumbar region: Secondary | ICD-10-CM | POA: Diagnosis not present

## 2018-04-23 ENCOUNTER — Ambulatory Visit: Payer: PPO | Admitting: Family Medicine

## 2018-05-25 DIAGNOSIS — M9903 Segmental and somatic dysfunction of lumbar region: Secondary | ICD-10-CM | POA: Diagnosis not present

## 2018-05-25 DIAGNOSIS — M5136 Other intervertebral disc degeneration, lumbar region: Secondary | ICD-10-CM | POA: Diagnosis not present

## 2018-05-25 DIAGNOSIS — M9902 Segmental and somatic dysfunction of thoracic region: Secondary | ICD-10-CM | POA: Diagnosis not present

## 2018-05-25 DIAGNOSIS — M5134 Other intervertebral disc degeneration, thoracic region: Secondary | ICD-10-CM | POA: Diagnosis not present

## 2018-05-31 ENCOUNTER — Encounter: Payer: Self-pay | Admitting: Family Medicine

## 2018-05-31 ENCOUNTER — Ambulatory Visit (INDEPENDENT_AMBULATORY_CARE_PROVIDER_SITE_OTHER): Payer: PPO | Admitting: Family Medicine

## 2018-05-31 ENCOUNTER — Ambulatory Visit: Payer: PPO | Admitting: Podiatry

## 2018-05-31 ENCOUNTER — Ambulatory Visit: Payer: PPO

## 2018-05-31 VITALS — BP 98/62 | HR 56 | Temp 97.7°F | Ht 66.0 in | Wt 184.2 lb

## 2018-05-31 DIAGNOSIS — E039 Hypothyroidism, unspecified: Secondary | ICD-10-CM | POA: Diagnosis not present

## 2018-05-31 DIAGNOSIS — T148XXA Other injury of unspecified body region, initial encounter: Secondary | ICD-10-CM | POA: Diagnosis not present

## 2018-05-31 DIAGNOSIS — N183 Chronic kidney disease, stage 3 unspecified: Secondary | ICD-10-CM

## 2018-05-31 DIAGNOSIS — I10 Essential (primary) hypertension: Secondary | ICD-10-CM

## 2018-05-31 DIAGNOSIS — K580 Irritable bowel syndrome with diarrhea: Secondary | ICD-10-CM | POA: Diagnosis not present

## 2018-05-31 LAB — CBC
HCT: 39.9 % (ref 36.0–46.0)
Hemoglobin: 13 g/dL (ref 12.0–15.0)
MCHC: 32.6 g/dL (ref 30.0–36.0)
MCV: 90.1 fl (ref 78.0–100.0)
Platelets: 114 10*3/uL — ABNORMAL LOW (ref 150.0–400.0)
RBC: 4.43 Mil/uL (ref 3.87–5.11)
RDW: 14.3 % (ref 11.5–15.5)
WBC: 9.2 10*3/uL (ref 4.0–10.5)

## 2018-05-31 LAB — COMPREHENSIVE METABOLIC PANEL
ALT: 104 U/L — ABNORMAL HIGH (ref 0–35)
AST: 52 U/L — ABNORMAL HIGH (ref 0–37)
Albumin: 3.8 g/dL (ref 3.5–5.2)
Alkaline Phosphatase: 104 U/L (ref 39–117)
BUN: 18 mg/dL (ref 6–23)
CO2: 26 mEq/L (ref 19–32)
Calcium: 9 mg/dL (ref 8.4–10.5)
Chloride: 102 mEq/L (ref 96–112)
Creatinine, Ser: 1.3 mg/dL — ABNORMAL HIGH (ref 0.40–1.20)
GFR: 41.04 mL/min — ABNORMAL LOW (ref >60.00)
Glucose, Bld: 102 mg/dL — ABNORMAL HIGH (ref 70–99)
Potassium: 4.1 mEq/L (ref 3.5–5.1)
Sodium: 136 mEq/L (ref 135–145)
Total Bilirubin: 0.8 mg/dL (ref 0.2–1.2)
Total Protein: 7.1 g/dL (ref 6.0–8.3)

## 2018-05-31 LAB — TSH: TSH: 6.87 u[IU]/mL — ABNORMAL HIGH (ref 0.35–4.50)

## 2018-05-31 MED ORDER — POLYETHYLENE GLYCOL 3350 17 GM/SCOOP PO POWD: 17.0000 g | Freq: Every day | ORAL | 0 refills | Status: DC | PRN

## 2018-05-31 NOTE — Assessment & Plan Note (Signed)
Check TSH.  This could be contributing to her constipation.  Continue Synthroid.

## 2018-05-31 NOTE — Assessment & Plan Note (Signed)
Check renal function. 

## 2018-05-31 NOTE — Patient Instructions (Signed)
Nice to see you. We will get lab work today and contact you with results. Please try the MiraLAX for your constipation.  If this is not improving over the next week please let us know.

## 2018-05-31 NOTE — Progress Notes (Signed)
  Tommi Rumps, MD Phone: [phone]  Whitney Simmons is a 84 y.o. female who presents today for f/u.  CC: htn, bruising, hypothyroidism, IBS  Hypertension: Not checking at home.  Taking losartan, Bystolic, and Lasix.  No chest pain, shortness of breath, or edema.  No lightheadedness.  Bruising: Patient notes she has had some bruises on her right leg.  Question of whether or not she hit anything with her leg.  She also notes some varicose veins on her right and left legs.  She notes no bruising elsewhere.  She does take a baby aspirin though not every day.  No NSAIDs.  Hypothyroidism: Taking Synthroid.  No skin changes or heat or cold intolerance.  IBS: Patient previously had diarrhea predominant IBS though has now switched to constipation predominant IBS.  She gets some bloating with eating.  Feels rumbling in her stomach though no pain.  Last bowel movement was 3 days ago.  No nausea.  No blood in her stool.  No straining.  She does pass gas.  Notes her stool is normal color.  She has not taken any medicine for this.  Social History   Tobacco Use  Smoking Status Never Smoker  Smokeless Tobacco Never Used     ROS see history of present illness  Objective  Physical Exam Vitals:   05/31/18 0942  BP: 98/62  Pulse: (!) 56  Temp: 97.7 F (36.5 C)  SpO2: 97%    BP Readings from Last 3 Encounters:  05/31/18 98/62  03/18/18 (!) 142/70  01/11/18 116/78   Wt Readings from Last 3 Encounters:  05/31/18 184 lb 3.2 oz (83.6 kg)  03/18/18 175 lb (79.4 kg)  11/24/17 187 lb (84.8 kg)    Physical Exam  Constitutional: No distress.  Cardiovascular: Normal rate, regular rhythm and normal heart sounds.  Pulmonary/Chest: Effort normal and breath sounds normal.  Abdominal: Soft. Bowel sounds are normal. She exhibits no distension. There is no tenderness. There is no rebound and no guarding.  Musculoskeletal: She exhibits no edema.  Neurological: She is alert.  Skin: Skin is warm  and dry. She is not diaphoretic.  Right lower leg with some bruising near the ankle, there are varicose veins bilateral lower extremities, no calf tenderness, no swelling     Assessment/Plan: Please see individual problem list.  Hypertension Adequately controlled.  Continue current regimen.  Check lab work.  IBS (irritable bowel syndrome) Seems to have constipation predominant IBS now.  Discussed MiraLAX for this.  If not improving in the next week she will let us know.  Hypothyroidism Check TSH.  This could be contributing to her constipation.  Continue Synthroid.  Chronic kidney disease, stage III (moderate) Check renal function.  Bruising Possibly related to aspirin and an unknown injury.  She does seem to have some varicose veins as well.  We will check a CBC to evaluate her platelets.  She will monitor.  If worsening she will let us know.   Orders Placed This Encounter  Procedures  . CBC  . Comp Met (CMET)  . TSH    Meds ordered this encounter  Medications  . polyethylene glycol powder (GLYCOLAX/MIRALAX) powder    Sig: Take 17 g by mouth daily as needed for mild constipation.    Dispense:  500 g    Refill:  0     Tommi Rumps, MD [place]

## 2018-05-31 NOTE — Assessment & Plan Note (Signed)
Possibly related to aspirin and an unknown injury.  She does seem to have some varicose veins as well.  We will check a CBC to evaluate her platelets.  She will monitor.  If worsening she will let us know.

## 2018-05-31 NOTE — Assessment & Plan Note (Signed)
Seems to have constipation predominant IBS now.  Discussed MiraLAX for this.  If not improving in the next week she will let us know.

## 2018-05-31 NOTE — Assessment & Plan Note (Signed)
Adequately controlled.  Continue current regimen.  Check lab work. 

## 2018-06-01 ENCOUNTER — Other Ambulatory Visit: Payer: Self-pay | Admitting: Family Medicine

## 2018-06-01 DIAGNOSIS — R945 Abnormal results of liver function studies: Principal | ICD-10-CM

## 2018-06-01 DIAGNOSIS — D696 Thrombocytopenia, unspecified: Secondary | ICD-10-CM

## 2018-06-01 DIAGNOSIS — R7989 Other specified abnormal findings of blood chemistry: Secondary | ICD-10-CM

## 2018-06-14 ENCOUNTER — Other Ambulatory Visit: Payer: PPO

## 2018-06-14 ENCOUNTER — Ambulatory Visit: Payer: PPO | Admitting: Podiatry

## 2018-06-17 ENCOUNTER — Ambulatory Visit: Payer: PPO | Admitting: Podiatry

## 2018-06-21 ENCOUNTER — Ambulatory Visit: Payer: PPO

## 2018-06-30 ENCOUNTER — Other Ambulatory Visit: Payer: Self-pay | Admitting: Family Medicine

## 2018-07-05 ENCOUNTER — Encounter: Payer: Self-pay | Admitting: Family Medicine

## 2018-07-05 ENCOUNTER — Other Ambulatory Visit (INDEPENDENT_AMBULATORY_CARE_PROVIDER_SITE_OTHER): Payer: PPO

## 2018-07-05 ENCOUNTER — Ambulatory Visit: Payer: PPO

## 2018-07-05 DIAGNOSIS — R945 Abnormal results of liver function studies: Secondary | ICD-10-CM

## 2018-07-05 DIAGNOSIS — D696 Thrombocytopenia, unspecified: Secondary | ICD-10-CM

## 2018-07-05 DIAGNOSIS — R7989 Other specified abnormal findings of blood chemistry: Secondary | ICD-10-CM

## 2018-07-05 DIAGNOSIS — E039 Hypothyroidism, unspecified: Secondary | ICD-10-CM

## 2018-07-05 LAB — CBC
HCT: 39.8 % (ref 36.0–46.0)
Hemoglobin: 12.8 g/dL (ref 12.0–15.0)
MCHC: 32.1 g/dL (ref 30.0–36.0)
MCV: 91.6 fl (ref 78.0–100.0)
Platelets: 202 10*3/uL (ref 150.0–400.0)
RBC: 4.34 Mil/uL (ref 3.87–5.11)
RDW: 14.4 % (ref 11.5–15.5)
WBC: 7.4 10*3/uL (ref 4.0–10.5)

## 2018-07-05 LAB — HEPATIC FUNCTION PANEL
ALT: 7 U/L (ref 0–35)
AST: 13 U/L (ref 0–37)
Albumin: 3.7 g/dL (ref 3.5–5.2)
Alkaline Phosphatase: 69 U/L (ref 39–117)
Bilirubin, Direct: 0.1 mg/dL (ref 0.0–0.3)
Total Bilirubin: 0.6 mg/dL (ref 0.2–1.2)
Total Protein: 6.8 g/dL (ref 6.0–8.3)

## 2018-07-12 ENCOUNTER — Ambulatory Visit: Payer: PPO | Admitting: Podiatry

## 2018-07-12 ENCOUNTER — Encounter: Payer: Self-pay | Admitting: Podiatry

## 2018-07-12 DIAGNOSIS — M79676 Pain in unspecified toe(s): Secondary | ICD-10-CM

## 2018-07-12 DIAGNOSIS — B351 Tinea unguium: Secondary | ICD-10-CM

## 2018-07-12 NOTE — Progress Notes (Signed)
Complaint:  Visit Type: Patient returns to my office for continued preventative foot care services. Complaint: Patient states" my nails have grown long and thick and become painful to walk and wear shoes". The patient presents for preventative foot care services. No changes to ROS.    Podiatric Exam: Vascular: dorsalis pedis and posterior tibial pulses are diminished   bilateral. Capillary return is immediate. Temperature gradient is WNL. Skin turgor WNL  Sensorium: Normal Semmes Weinstein monofilament test. Normal tactile sensation bilaterally. Nail Exam: Pt has thick disfigured discolored nails with subungual debris noted bilateral entire nail hallux through fifth toenails.   Ulcer Exam: There is no evidence of ulcer or pre-ulcerative changes or infection. Orthopedic Exam: Muscle tone and strength are WNL. No limitations in general ROM. No crepitus or effusions noted.  Contracted  Hammer toe fifth right.  Amputation of fifth toe left foot . Skin: No Porokeratosis. No infection or ulcers.  Asymptomatic clavi second right.  Diagnosis:  Onychomycosis, , Pain in right toe, pain in left toes  Treatment & Plan Procedures and Treatment: Consent by patient was obtained for treatment procedures. The patient understood the discussion of treatment and procedures well. All questions were answered thoroughly reviewed. Debridement of mycotic and hypertrophic toenails, 1 through 5 bilateral and clearing of subungual debris. No ulceration, no infection noted.  Return Visit-Office Procedure: Patient instructed to return to the office for a follow up visit 4 months for continued evaluation and treatment.     Gardiner Barefoot DPM

## 2018-07-19 ENCOUNTER — Other Ambulatory Visit (INDEPENDENT_AMBULATORY_CARE_PROVIDER_SITE_OTHER): Payer: PPO

## 2018-07-19 DIAGNOSIS — E039 Hypothyroidism, unspecified: Secondary | ICD-10-CM

## 2018-07-19 LAB — TSH: TSH: 1.15 u[IU]/mL (ref 0.35–4.50)

## 2018-07-21 ENCOUNTER — Other Ambulatory Visit: Payer: Self-pay | Admitting: Family Medicine

## 2018-07-23 DIAGNOSIS — H903 Sensorineural hearing loss, bilateral: Secondary | ICD-10-CM | POA: Diagnosis not present

## 2018-07-30 ENCOUNTER — Other Ambulatory Visit: Payer: Self-pay | Admitting: Pharmacist

## 2018-07-30 NOTE — Patient Outreach (Signed)
[facility]) Care Management  [place]   07/30/2018  CHRISANN MELARAGNO 09-17-1927 [id number]  [place] Medication(s): losartan Current insurance:[employer]   Outreach:  Incoming call from [employer] in response to the [facility] Medication Adherence Campaign. Speak with patient, but phone connection is poor. Patient requests a call back later in the day.   Call back to patient in the afternoon. No answer. Leave a HIPAA compliant message on the patient's voicemail.   Harlow Asa, PharmD, [place] [facility] [phone]

## 2018-08-03 DIAGNOSIS — H401121 Primary open-angle glaucoma, left eye, mild stage: Secondary | ICD-10-CM | POA: Diagnosis not present

## 2018-08-09 IMAGING — CT CT ABD-PELV W/O CM
1 of 2 series · 15 of 32 positions shown, 19 images · non-contrast
Comparison: None.

CLINICAL DATA: Pt c/o for approximately 2-3 months after eating she
is having abdominal bloating. She states she is having intermittent
diarrhea. NKI. Hx Left Breast CA with lumpectomy in 7379. Hx
Cholecystectomy. Hx Hiatal Hernia.

EXAM:
CT ABDOMEN AND PELVIS WITHOUT CONTRAST
TECHNIQUE: Multidetector CT imaging of the abdomen and pelvis was performed
following the standard protocol without IV contrast.

[Series 2: axial st · axial · 0.81mm/px · z∈[-936,-532]mm · 15 of 89 slices shown, 19 images]
[im 4/89  soft-tissue]
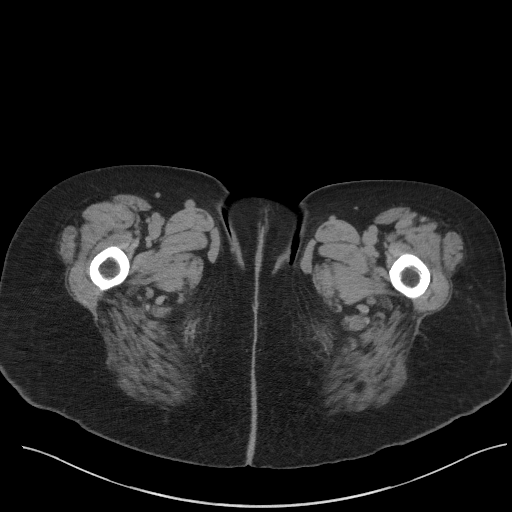
[im 4/89  bone]
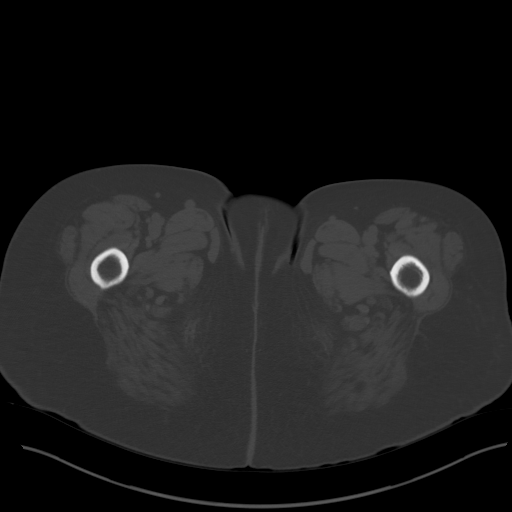
[im 11/89  soft-tissue]
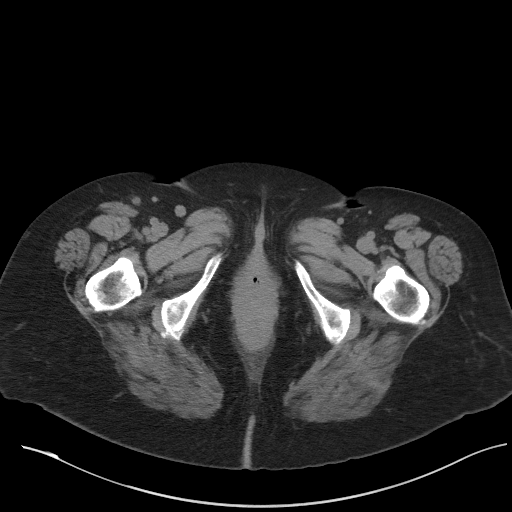
[im 18/89  soft-tissue]
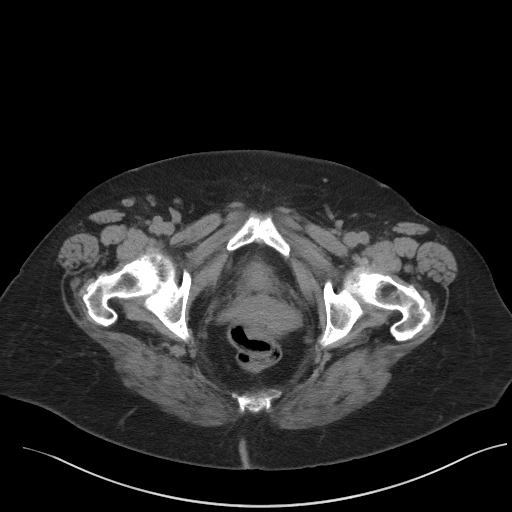
[im 25/89  soft-tissue]
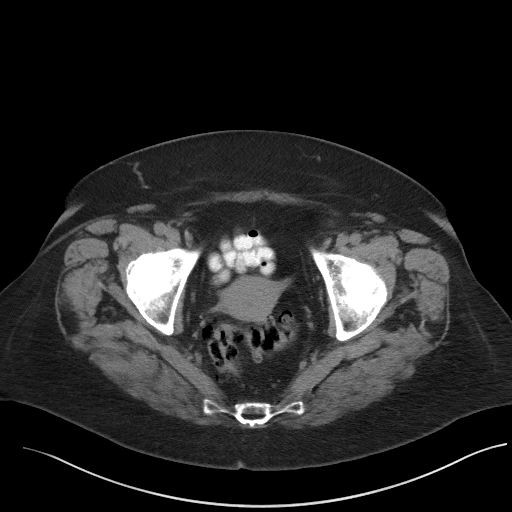
[im 32/89  soft-tissue]
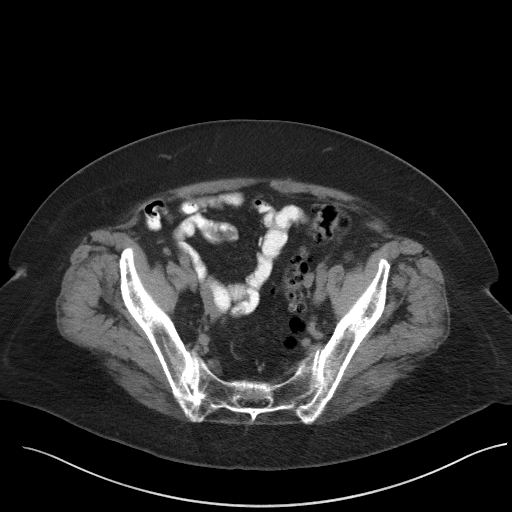
[im 39/89  soft-tissue]
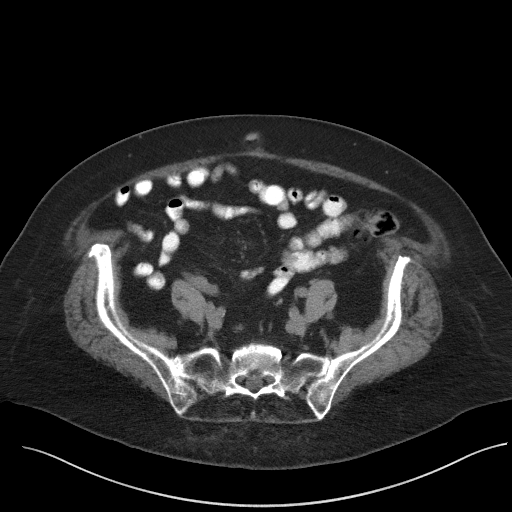
[im 46/89  soft-tissue]
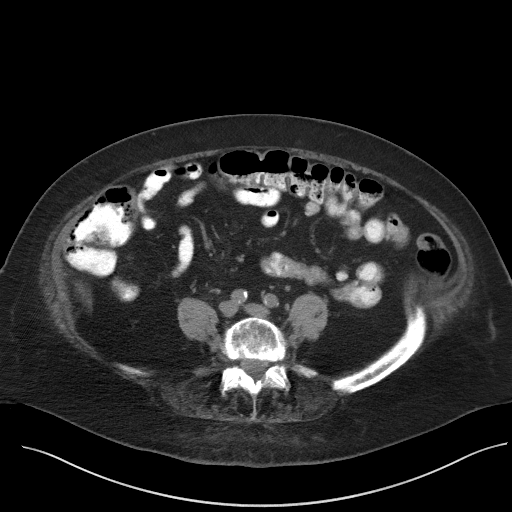
[im 50/89  soft-tissue]
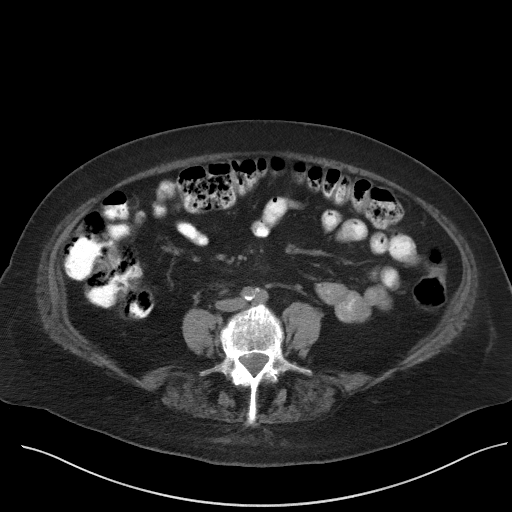
[im 57/89  soft-tissue]
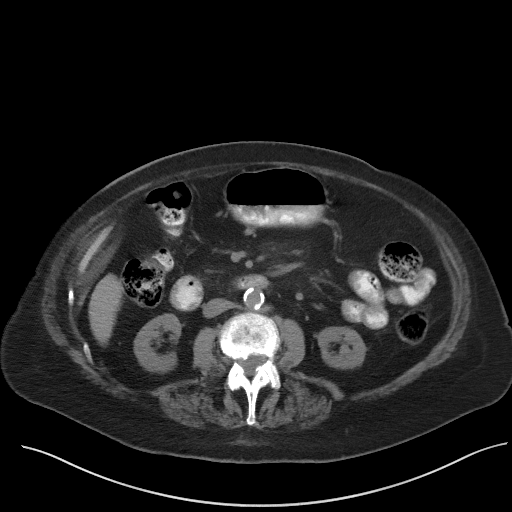
[im 57/89  bone]
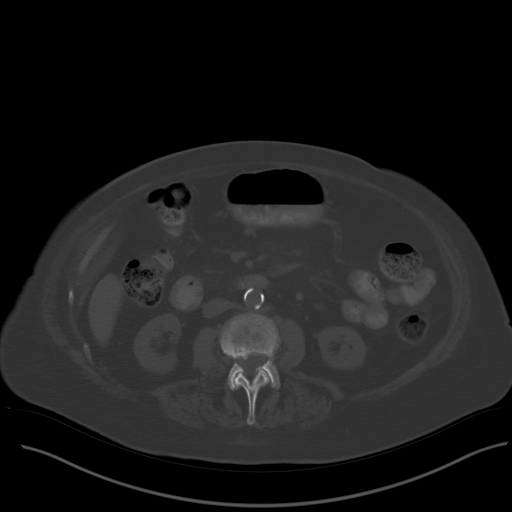
[im 64/89  soft-tissue]
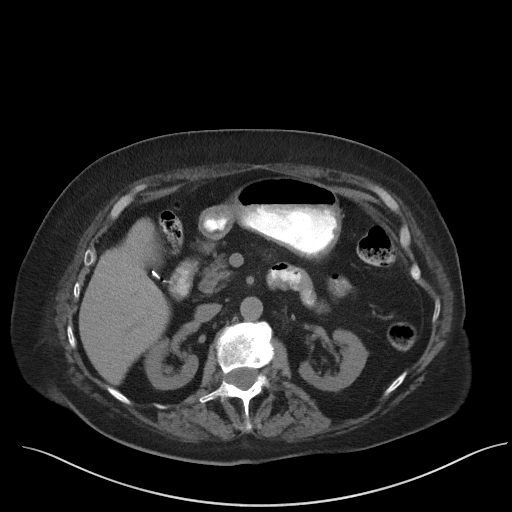
[im 71/89  soft-tissue]
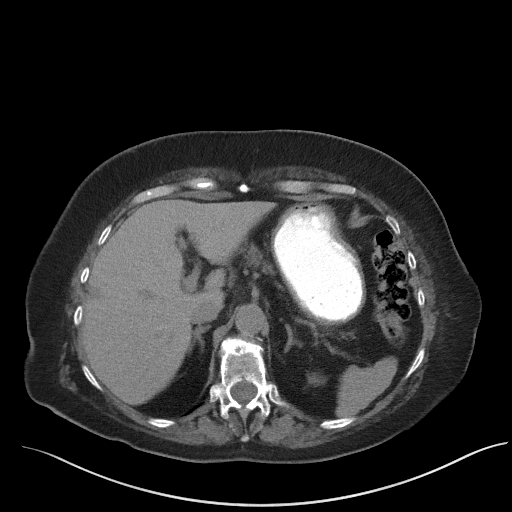
[im 74/89  lung]
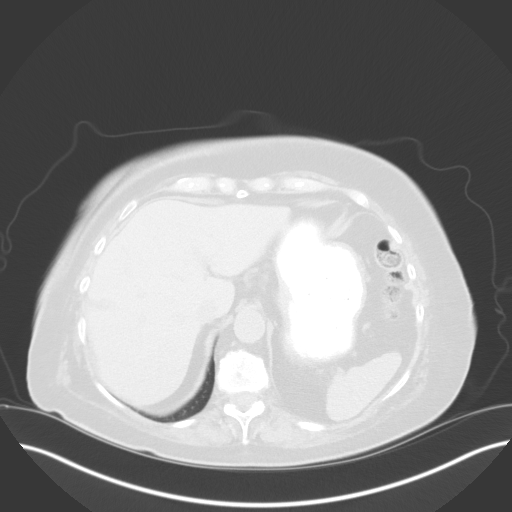
[im 78/89  soft-tissue]
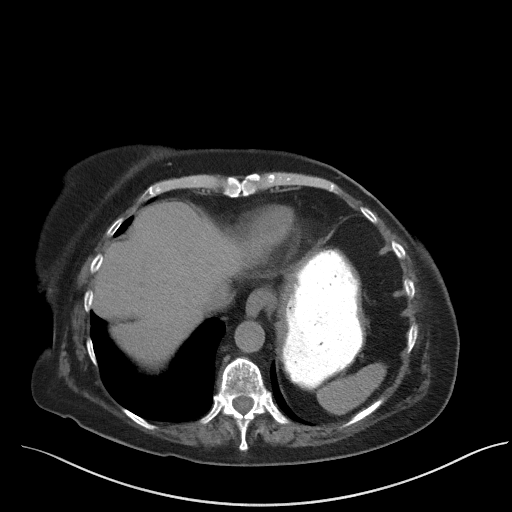
[im 78/89  lung]
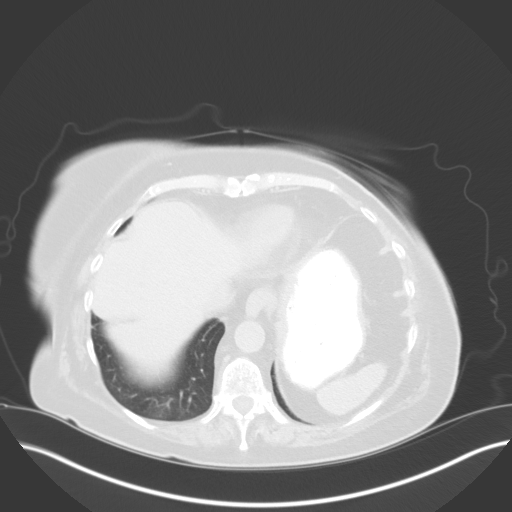
[im 81/89  lung]
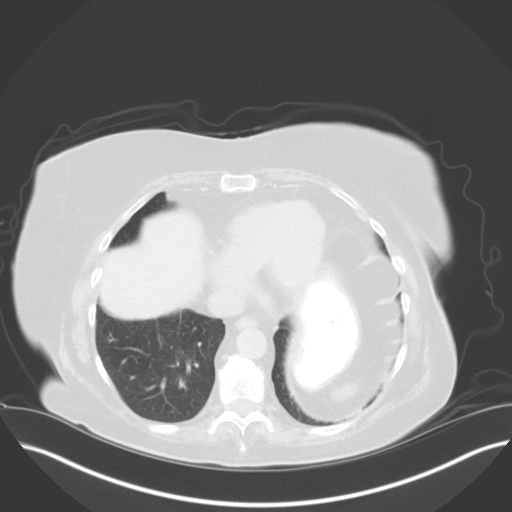
[im 85/89  soft-tissue]
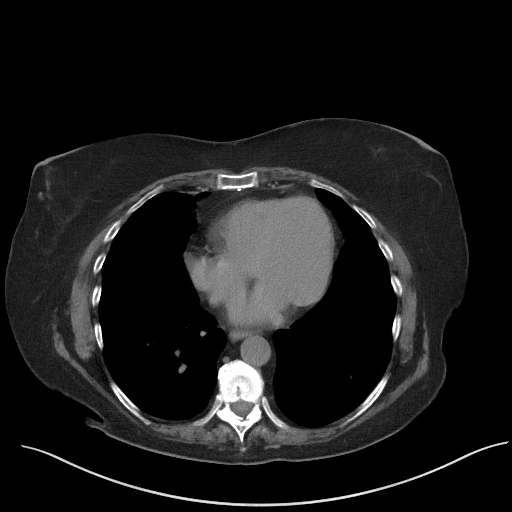
[im 85/89  lung]
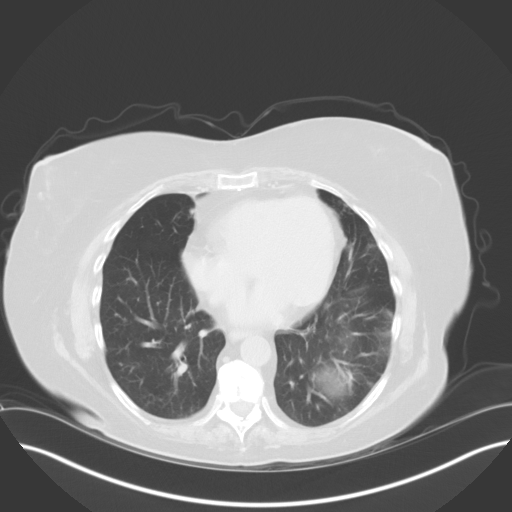

[15 of 32 positions shown; findings below may reference images not displayed]

FINDINGS: Lower chest:  No acute findings.

Hepatobiliary: Cholecystectomy clips. Unremarkable unenhanced liver.

Pancreas: Mild atrophy. No mass, ductal dilatation, or inflammatory
change.

Spleen: Within normal limits in size.

Adrenals/Urinary Tract: No evidence of urolithiasis or
hydronephrosis. No definite mass visualized on this un-enhanced
exam.

Stomach/Bowel: Stomach incompletely distended. Small bowel
nondilated. Appendix not identified. The colon is nondilated.
Multiple sigmoid diverticula without significant adjacent
inflammatory/ edematous change.

Vascular/Lymphatic: Patchy aortoiliac arterial plaque without
aneurysm. Retroaortic left renal vein, an anatomic variant. No
adenopathy localized.

Reproductive: No mass or other significant abnormality.

Other: No ascites.  No free air.

Musculoskeletal: Spondylitic changes in the lower lumbar spine.
Negative for fracture.
IMPRESSION: 1. No acute abdominal process.
2. Sigmoid diverticulosis
3.  Aortic Atherosclerosis ([id number])

## 2018-08-10 DIAGNOSIS — M5134 Other intervertebral disc degeneration, thoracic region: Secondary | ICD-10-CM | POA: Diagnosis not present

## 2018-08-10 DIAGNOSIS — M9903 Segmental and somatic dysfunction of lumbar region: Secondary | ICD-10-CM | POA: Diagnosis not present

## 2018-08-10 DIAGNOSIS — M5136 Other intervertebral disc degeneration, lumbar region: Secondary | ICD-10-CM | POA: Diagnosis not present

## 2018-08-10 DIAGNOSIS — M9902 Segmental and somatic dysfunction of thoracic region: Secondary | ICD-10-CM | POA: Diagnosis not present

## 2018-09-10 ENCOUNTER — Other Ambulatory Visit: Payer: Self-pay | Admitting: Family Medicine

## 2018-10-14 ENCOUNTER — Ambulatory Visit (INDEPENDENT_AMBULATORY_CARE_PROVIDER_SITE_OTHER): Payer: PPO | Admitting: Family Medicine

## 2018-10-14 ENCOUNTER — Other Ambulatory Visit: Payer: Self-pay

## 2018-10-14 ENCOUNTER — Encounter: Payer: Self-pay | Admitting: Family Medicine

## 2018-10-14 ENCOUNTER — Telehealth: Payer: Self-pay

## 2018-10-14 DIAGNOSIS — N390 Urinary tract infection, site not specified: Secondary | ICD-10-CM | POA: Diagnosis not present

## 2018-10-14 MED ORDER — CEFDINIR 300 MG PO CAPS: 300.0000 mg | ORAL_CAPSULE | Freq: Two times a day (BID) | ORAL | 0 refills | Status: AC

## 2018-10-14 NOTE — Progress Notes (Signed)
Patient ID: Whitney Simmons, female   DOB: 07/21/1927, 85 y.o.   MRN: [id number]  Virtual Visit via video Note  This visit type was conducted due to national recommendations for restrictions regarding the COVID-19 pandemic (e.g. social distancing).  This format is felt to be most appropriate for this patient at this time.  All issues noted in this document were discussed and addressed.  No physical exam was performed (except for noted visual exam findings with Video Visits).   I connected with Whitney Simmons on 10/14/18 at  1:00 PM EDT by a video enabled telemedicine application and verified that I am speaking with the correct person using two identifiers. Location patient: home Location provider: [facility] Persons participating in the virtual visit: patient, patient's daughter & provider  I discussed the limitations, risks, security and privacy concerns of performing an evaluation and management service by video and the availability of in person appointments. I also discussed with the patient that there may be a patient responsible charge related to this service. The patient expressed understanding and agreed to proceed.  Video communication was attempted between this provider patient and her daughter, but the video application on patient's daughter's phone was not operating appropriately.  We decided to proceed forward with audio only.   HPI:  Patient and I connected today due to complaining of urinary frequency, urgency and some pressure/burning with urination.  Patient states his symptoms have been off and on for 1 week.  Daughter brought over over-the-counter Azo urinary dipsticks, and when they dipped her urine did show positive for bacteria according to the directions on the Azo box.  Patient denies any fever.  She has no nausea or vomiting.  Review of Systems  Constitutional: Negative for chills, fatigue and fever.  HENT: Negative for congestion, ear pain, sinus pain and sore throat.    Eyes: Negative.   Respiratory: Negative for cough, shortness of breath and wheezing.   Cardiovascular: Negative for chest pain, palpitations and leg swelling.  Gastrointestinal: Negative for abdominal pain, diarrhea, nausea and vomiting.  Genitourinary: +dysuria, frequency and urgency.  Musculoskeletal: Negative for arthralgias and myalgias.  Skin: Negative for color change, pallor and rash.  Neurological: Negative for syncope, light-headedness and headaches.  Psychiatric/Behavioral: The patient is not nervous/anxious.     Past Medical History:  Diagnosis Date  . Breast cancer ([place]) 2015   LT LUMPECTOMY  . 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 Declined postoperative radiation and antiestrogen therapy.  . Edema   . Hyperlipidemia   . Hypertension   . IBS (irritable bowel syndrome)   . Irregular heart beat   . Thyroid disease    s/p radioactive iodine ablation 30 years ago    Past Surgical History:  Procedure Laterality Date  . BREAST LUMPECTOMY Left 2015  . BREAST SURGERY Left 02-15-14   wide excision  . CATARACT EXTRACTION    . CHOLECYSTECTOMY    . TOE AMPUTATION     left 5th toe    Family History  Problem Relation Age of Onset  . Heart disease Mother   . Heart disease Maternal Aunt   . Breast cancer Neg Hx    Social History   Tobacco Use  . Smoking status: Never Smoker  . Smokeless tobacco: Never Used  Substance Use Topics  . Alcohol use: No    Current Outpatient Medications:  .  aspirin 81 MG tablet, Take 81 mg by mouth daily., Disp: , Rfl:  .  BYSTOLIC 5 MG tablet, Take 1 tablet by mouth once daily, Disp: 90 tablet, Rfl: 0 .  levothyroxine (SYNTHROID, LEVOTHROID) 100 MCG tablet, TAKE 1 TABLET BY MOUTH ONCE DAILY BEFORE BREAKFAST, Disp: 90 tablet, Rfl: 0 .  losartan (COZAAR) 50 MG tablet, Take 1 tablet by mouth once daily, Disp: 90 tablet, Rfl: 0 .  Multiple Vitamin (MULTIVITAMIN) tablet, Take 1 tablet by mouth  daily., Disp: , Rfl:  .  nebivolol (BYSTOLIC) 10 MG tablet, Take 1 tablet (10 mg total) by mouth daily., Disp: 30 tablet, Rfl: 6 .  polyethylene glycol powder (GLYCOLAX/MIRALAX) powder, Take 17 g by mouth daily as needed for mild constipation., Disp: 500 g, Rfl: 0 .  ROCKLATAN 0.02-0.005 % SOLN, INSTILL 1 DROP INTO EACH EYE IN THE EVENING FOR 30 DAYS, Disp: , Rfl: 6 .  cefdinir (OMNICEF) 300 MG capsule, Take 1 capsule (300 mg total) by mouth 2 (two) times daily for 7 days., Disp: 14 capsule, Rfl: 0 .  furosemide (LASIX) 20 MG tablet, Take 1 tablet (20 mg total) by mouth daily., Disp: 90 tablet, Rfl: 3  EXAM:   GENERAL: alert, oriented, sounds well and in no acute distress  LUNGS: Speaking in full sentences, does not sound breathless.  PSYCH/NEURO: pleasant and cooperative, no obvious depression or anxiety, speech and thought processing grossly intact  ASSESSMENT AND PLAN:  Discussed the following assessment and plan:  Urinary tract infection without hematuria, site unspecified - Plan: cefdinir (OMNICEF) 300 MG capsule  Patient will take Omnicef twice daily for 7 days to treat urinary tract infection.  Advised to keep up good water intake, avoid excess sugary and caffeinated beverages, always wipe front to back and to wear cotton underwear.  Advised that if symptoms persist even after antibiotic treatment, she will need to come in and do a urine sample at our lab.  Advised if any alarm symptoms occur including development of fever, development of vomiting or diarrhea, inability to urinate she must call office right away and or go to an urgent care/ER for evaluation.   I discussed the assessment and treatment plan with the patient. The patient was provided an opportunity to ask questions and all were answered. The patient agreed with the plan and demonstrated an understanding of the instructions.   The patient was advised to call back or seek an in-person evaluation if the symptoms worsen  or if the condition fails to improve as anticipated.   Jodelle Green, FNP

## 2018-10-14 NOTE — Telephone Encounter (Signed)
Copied from [place] [id number]. Topic: General - Inquiry >> Oct 14, 2018 12:26 PM Virl Axe D wrote: Reason for CRM: Pt's daughter Whitney Simmons stated they have not received doxy text. They were informed it would be sent before appointment today at 1pm.

## 2018-10-14 NOTE — Telephone Encounter (Signed)
Pt had a Doxy visit with NP Philis Nettle at 1:00pm today

## 2018-11-08 ENCOUNTER — Ambulatory Visit: Payer: PPO | Admitting: Podiatry

## 2018-11-25 ENCOUNTER — Encounter: Payer: Self-pay | Admitting: Podiatry

## 2018-11-25 ENCOUNTER — Other Ambulatory Visit: Payer: Self-pay

## 2018-11-25 ENCOUNTER — Ambulatory Visit: Payer: PPO | Admitting: Podiatry

## 2018-11-25 VITALS — Temp 98.0°F

## 2018-11-25 DIAGNOSIS — L84 Corns and callosities: Secondary | ICD-10-CM

## 2018-11-25 DIAGNOSIS — M79676 Pain in unspecified toe(s): Secondary | ICD-10-CM

## 2018-11-25 DIAGNOSIS — B351 Tinea unguium: Secondary | ICD-10-CM

## 2018-11-25 NOTE — Progress Notes (Signed)
Complaint:  Visit Type: Patient returns to my office for continued preventative foot care services. Complaint: Patient states" my nails have grown long and thick and become painful to walk and wear shoes". The patient presents for preventative foot care services. No changes to ROS.    Podiatric Exam: Vascular: dorsalis pedis and posterior tibial pulses are diminished   bilateral. Capillary return is immediate. Temperature gradient is WNL. Skin turgor WNL  Sensorium: Normal Semmes Weinstein monofilament test. Normal tactile sensation bilaterally. Nail Exam: Pt has thick disfigured discolored nails with subungual debris noted bilateral entire nail hallux through fifth toenails.   Ulcer Exam: There is no evidence of ulcer or pre-ulcerative changes or infection. Orthopedic Exam: Muscle tone and strength are WNL. No limitations in general ROM. No crepitus or effusions noted.  Contracted  Hammer toe fifth right.  Amputation of fifth toe left foot . Skin: No Porokeratosis. No infection or ulcers. Ssymptomatic clavi second right.  Diagnosis:  Onychomycosis, , Pain in right toe, pain in left toes Clavi  Treatment & Plan Procedures and Treatment: Consent by patient was obtained for treatment procedures. The patient understood the discussion of treatment and procedures well. All questions were answered thoroughly reviewed. Debridement of mycotic and hypertrophic toenails, 1 through 5 bilateral and clearing of subungual debris. No ulceration, no infection noted. Debride clavi. Return Visit-Office Procedure: Patient instructed to return to the office for a follow up visit 4 months for continued evaluation and treatment.     Gardiner Barefoot DPM

## 2018-12-06 ENCOUNTER — Encounter: Payer: Self-pay | Admitting: Family Medicine

## 2018-12-06 ENCOUNTER — Other Ambulatory Visit: Payer: Self-pay

## 2018-12-06 ENCOUNTER — Ambulatory Visit (INDEPENDENT_AMBULATORY_CARE_PROVIDER_SITE_OTHER): Payer: PPO | Admitting: Family Medicine

## 2018-12-06 DIAGNOSIS — N3000 Acute cystitis without hematuria: Secondary | ICD-10-CM | POA: Diagnosis not present

## 2018-12-06 DIAGNOSIS — H9193 Unspecified hearing loss, bilateral: Secondary | ICD-10-CM | POA: Diagnosis not present

## 2018-12-06 DIAGNOSIS — E039 Hypothyroidism, unspecified: Secondary | ICD-10-CM

## 2018-12-06 DIAGNOSIS — D0512 Intraductal carcinoma in situ of left breast: Secondary | ICD-10-CM

## 2018-12-06 DIAGNOSIS — H9190 Unspecified hearing loss, unspecified ear: Secondary | ICD-10-CM | POA: Insufficient documentation

## 2018-12-06 DIAGNOSIS — I10 Essential (primary) hypertension: Secondary | ICD-10-CM | POA: Diagnosis not present

## 2018-12-06 DIAGNOSIS — F419 Anxiety disorder, unspecified: Secondary | ICD-10-CM | POA: Diagnosis not present

## 2018-12-06 DIAGNOSIS — N390 Urinary tract infection, site not specified: Secondary | ICD-10-CM | POA: Insufficient documentation

## 2018-12-06 MED ORDER — LEVOTHYROXINE SODIUM 100 MCG PO TABS: ORAL_TABLET | ORAL | 1 refills | Status: DC

## 2018-12-06 MED ORDER — FUROSEMIDE 20 MG PO TABS: 20.0000 mg | ORAL_TABLET | Freq: Every day | ORAL | 2 refills | Status: DC

## 2018-12-06 MED ORDER — LOSARTAN POTASSIUM 50 MG PO TABS: 50.0000 mg | ORAL_TABLET | Freq: Every day | ORAL | 2 refills | Status: DC

## 2018-12-06 MED ORDER — NEBIVOLOL HCL 5 MG PO TABS: 5.0000 mg | ORAL_TABLET | Freq: Every day | ORAL | 2 refills | Status: DC

## 2018-12-06 NOTE — Assessment & Plan Note (Signed)
Previously well controlled.  Undetermined control currently given lack of blood pressure machine at home.  She will buy a cuff and start checking at home.  Continue current medication.  She will come in for lab work.

## 2018-12-06 NOTE — Assessment & Plan Note (Signed)
I encouraged patient to follow-up with ENT.

## 2018-12-06 NOTE — Progress Notes (Signed)
Virtual Visit via Telephone Note  This visit type was conducted due to national recommendations for restrictions regarding the COVID-19 pandemic (e.g. social distancing).  This format is felt to be most appropriate for this patient at this time.  All issues noted in this document were discussed and addressed.  No physical exam was performed (except for noted visual exam findings with Video Visits).   I connected with Whitney Simmons today at  9:30 AM EDT by telephone and verified that I am speaking with the correct person using two identifiers. Location patient: home Location provider: work Persons participating in the virtual visit: patient, provider  I discussed the limitations, risks, security and privacy concerns of performing an evaluation and management service by telephone and the availability of in person appointments. I also discussed with the patient that there may be a patient responsible charge related to this service. The patient expressed understanding and agreed to proceed.  Interactive audio and video telecommunications were attempted between this provider and patient, however failed, due to patient having technical difficulties OR patient did not have access to video capability.  We continued and completed visit with audio only.   Reason for visit: follow-up  HPI: Hypertension: Not checking blood pressures.  She is taking Bystolic, losartan, and Lasix.  No chest pain, shortness of breath, or edema.  Hypothyroidism: Taking Synthroid.  No skin changes.  No heat or cold intolerance.  Anxiety: Patient does note it has been hard recently as she has not been able to see her husband who is in the assisted living facility.  She notes no depression.  No excessive anxiety.  Difficulty hearing: She notes this is getting worse.  She is having trouble hearing some voices compared to others.  She notes both ears are affected.  No tinnitus.  No vertigo.  History of breast cancer: Patient  had a negative mammogram 02/11/2018.  She continues to see Dr. Bary Castilla.  She notes no lymph lumps.  UTI: Notes her symptoms resolved with Omnicef.   ROS: See pertinent positives and negatives per HPI.  Past Medical History:  Diagnosis Date  . Breast cancer ([place]) 2015   LT LUMPECTOMY  . Breast microcalcification, mammographic 01/31/2014  . 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 Declined postoperative radiation and antiestrogen therapy.  . Edema   . Hyperlipidemia   . Hypertension   . IBS (irritable bowel syndrome)   . Irregular heart beat   . Thyroid disease    s/p radioactive iodine ablation 30 years ago    Past Surgical History:  Procedure Laterality Date  . BREAST LUMPECTOMY Left 2015  . BREAST SURGERY Left 02-15-14   wide excision  . CATARACT EXTRACTION    . CHOLECYSTECTOMY    . TOE AMPUTATION     left 5th toe    Family History  Problem Relation Age of Onset  . Heart disease Mother   . Heart disease Maternal Aunt   . Breast cancer Neg Hx     SOCIAL HX: Non-smoker.   Current Outpatient Medications:  .  aspirin 81 MG tablet, Take 81 mg by mouth daily., Disp: , Rfl:  .  levothyroxine (SYNTHROID) 100 MCG tablet, TAKE 1 TABLET BY MOUTH ONCE DAILY BEFORE BREAKFAST, Disp: 90 tablet, Rfl: 1 .  losartan (COZAAR) 50 MG tablet, Take 1 tablet (50 mg total) by mouth daily., Disp: 90 tablet, Rfl: 2 .  Multiple Vitamin (MULTIVITAMIN) tablet, Take 1 tablet by mouth daily.,  Disp: , Rfl:  .  nebivolol (BYSTOLIC) 5 MG tablet, Take 1 tablet (5 mg total) by mouth daily., Disp: 90 tablet, Rfl: 2 .  polyethylene glycol powder (GLYCOLAX/MIRALAX) powder, Take 17 g by mouth daily as needed for mild constipation., Disp: 500 g, Rfl: 0 .  ROCKLATAN 0.02-0.005 % SOLN, INSTILL 1 DROP INTO EACH EYE IN THE EVENING FOR 30 DAYS, Disp: , Rfl: 6 .  furosemide (LASIX) 20 MG tablet, Take 1 tablet (20 mg total) by mouth daily., Disp: 90 tablet, Rfl: 2  EXAM:  This was a telehealth telephone visit and thus no physical exam was completed.  ASSESSMENT AND PLAN:  Discussed the following assessment and plan:  Essential hypertension - Plan: Basic Metabolic Panel (BMET)  Hypothyroidism, unspecified type - Plan: TSH  Anxiety  Ductal carcinoma in situ (DCIS) of left breast  Hearing difficulty of both ears  Acute cystitis without hematuria  Hypertension Previously well controlled.  Undetermined control currently given lack of blood pressure machine at home.  She will buy a cuff and start checking at home.  Continue current medication.  She will come in for lab work.  Hypothyroidism Check TSH.  Continue Synthroid.  Anxiety She will monitor for worsening symptoms.  DCIS (ductal carcinoma in situ) of breast History of breast cancer.  She will continue yearly mammograms and follow-up with general surgery.  Difficulty hearing I encouraged patient to follow-up with ENT.  UTI (urinary tract infection) Symptoms resolved with treatment.  She will monitor for recurrence and contact us if she has any recurrent symptoms.  [place] office staff will contact patient to schedule follow-up in 6 months.  She will have lab work scheduled for about a month.  Social distancing precautions and sick precautions given regarding COVID-19.   I discussed the assessment and treatment plan with the patient. The patient was provided an opportunity to ask questions and all were answered. The patient agreed with the plan and demonstrated an understanding of the instructions.   The patient was advised to call back or seek an in-person evaluation if the symptoms worsen or if the condition fails to improve as anticipated.  I provided 21 minutes of non-face-to-face time during this encounter.   Tommi Rumps, MD

## 2018-12-06 NOTE — Assessment & Plan Note (Signed)
She will monitor for worsening symptoms.

## 2018-12-06 NOTE — Assessment & Plan Note (Signed)
Check TSH.  Continue Synthroid. 

## 2018-12-06 NOTE — Assessment & Plan Note (Signed)
History of breast cancer.  She will continue yearly mammograms and follow-up with general surgery.

## 2018-12-06 NOTE — Assessment & Plan Note (Signed)
Symptoms resolved with treatment.  She will monitor for recurrence and contact us if she has any recurrent symptoms.

## 2018-12-21 ENCOUNTER — Other Ambulatory Visit: Payer: Self-pay

## 2018-12-21 ENCOUNTER — Ambulatory Visit (INDEPENDENT_AMBULATORY_CARE_PROVIDER_SITE_OTHER): Payer: PPO

## 2018-12-21 DIAGNOSIS — Z0000 Encounter for general adult medical examination without abnormal findings: Secondary | ICD-10-CM | POA: Diagnosis not present

## 2018-12-21 NOTE — Patient Instructions (Addendum)
  Whitney Simmons , Thank you for taking time to come for your Medicare Wellness Visit. I appreciate your ongoing commitment to your health goals. Please review the following plan we discussed and let me know if I can assist you in the future.   These are the goals we discussed: Goals      Patient Stated   . Blood Pressure < 140/90 (pt-stated)     Purchase new blood pressure cuff and log for pcp as directed       This is a list of the screening recommended for you and due dates:  Health Maintenance  Topic Date Due  . Flu Shot  01/29/2019  . Tetanus Vaccine  06/02/2027  . DEXA scan (bone density measurement)  Completed  . Pneumonia vaccines  Completed

## 2018-12-21 NOTE — Progress Notes (Signed)
I have reviewed the above note and agree.  Cuauhtemoc Huegel, M.D.  

## 2018-12-21 NOTE — Progress Notes (Signed)
Subjective:   Whitney Simmons is a 85 y.o. female who presents for Medicare Annual (Subsequent) preventive examination.  Review of Systems:  No ROS.  Medicare Wellness Virtual Visit.  Visual/audio telehealth visit, UTA vital signs.   See social history for additional risk factors.   Cardiac Risk Factors include: advanced age (>4men, >3 women);hypertension     Objective:     Vitals: There were no vitals taken for this visit.  There is no height or weight on file to calculate BMI.  Advanced Directives 12/21/2018 09/14/2017 05/29/2017  Does Patient Have a Medical Advance Directive? Yes No Yes  Type of Advance Directive Living will;Healthcare Power of Attorney - Living will;Healthcare Power of Attorney  Does patient want to make changes to medical advance directive? No - Patient declined - No - Patient declined  Copy of [place] in Chart? No - copy requested - No - copy requested  Would patient like information on creating a medical advance directive? - No - Patient declined -    Tobacco Social History   Tobacco Use  Smoking Status Never Smoker  Smokeless Tobacco Never Used     Counseling given: Not Answered   Clinical Intake:  Pre-visit preparation completed: Yes        Diabetes: No  How often do you need to have someone help you when you read instructions, pamphlets, or other written materials from your doctor or pharmacy?: 1 - Never  Interpreter Needed?: No     Past Medical History:  Diagnosis Date  . Breast cancer ([place]) 2015   LT LUMPECTOMY  . Breast microcalcification, mammographic 01/31/2014  . 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 Declined postoperative radiation and antiestrogen therapy.  . Edema   . Hyperlipidemia   . Hypertension   . IBS (irritable bowel syndrome)   . Irregular heart beat   . Thyroid disease    s/p radioactive iodine ablation 30 years ago   Past Surgical History:   Procedure Laterality Date  . BREAST LUMPECTOMY Left 2015  . BREAST SURGERY Left 02-15-14   wide excision  . CATARACT EXTRACTION    . CHOLECYSTECTOMY    . TOE AMPUTATION     left 5th toe   Family History  Problem Relation Age of Onset  . Heart disease Mother   . Heart disease Maternal Aunt   . Breast cancer Neg Hx    Social History   Socioeconomic History  . Marital status: Married    Spouse name: Not on file  . Number of children: Not on file  . Years of education: Not on file  . Highest education level: Not on file  Occupational History  . Not on file  Social Needs  . Financial resource strain: Not hard at all  . Food insecurity    Worry: Never true    Inability: Never true  . Transportation needs    Medical: No    Non-medical: No  Tobacco Use  . Smoking status: Never Smoker  . Smokeless tobacco: Never Used  Substance and Sexual Activity  . Alcohol use: No  . Drug use: No  . Sexual activity: Not on file  Lifestyle  . Physical activity    Days per week: Not on file    Minutes per session: Not on file  . Stress: Not at all  Relationships  . Social connections    Talks on phone: Not on file  Gets together: Not on file    Attends religious service: Not on file    Active member of club or organization: Not on file    Attends meetings of clubs or organizations: Not on file    Relationship status: Not on file  Other Topics Concern  . Not on file  Social History Narrative   Lives in [place] at [place]. From [place]. Has daughter.      Work- Retired Copywriter, advertising Medications as of 12/21/2018  Medication Sig  . aspirin 81 MG tablet Take 81 mg by mouth daily.  . furosemide (LASIX) 20 MG tablet Take 1 tablet (20 mg total) by mouth daily.  Marland Kitchen levothyroxine (SYNTHROID) 100 MCG tablet TAKE 1 TABLET BY MOUTH ONCE DAILY BEFORE BREAKFAST  . losartan (COZAAR) 50 MG tablet Take 1 tablet (50 mg total) by mouth daily.  . Multiple Vitamin  (MULTIVITAMIN) tablet Take 1 tablet by mouth daily.  . nebivolol (BYSTOLIC) 5 MG tablet Take 1 tablet (5 mg total) by mouth daily.  . polyethylene glycol powder (GLYCOLAX/MIRALAX) powder Take 17 g by mouth daily as needed for mild constipation.  Marland Kitchen ROCKLATAN 0.02-0.005 % SOLN INSTILL 1 DROP INTO EACH EYE IN THE EVENING FOR 30 DAYS   No facility-administered encounter medications on file as of 12/21/2018.     Activities of Daily Living In your present state of health, do you have any difficulty performing the following activities: 12/21/2018  Hearing? Y  Comment Hearing aids  Vision? N  Difficulty concentrating or making decisions? N  Walking or climbing stairs? Y  Comment Unsteady gait. Rollator walker in use when ambulating.  Dressing or bathing? N  Doing errands, shopping? N  Preparing Food and eating ? N  Comment Meals prepared at facility. She does not cook but uses microwave.  Using the Toilet? N  In the past six months, have you accidently leaked urine? N  Do you have problems with loss of bowel control? N  Managing your Medications? N  Managing your Finances? Y  Comment Daughter assists  Housekeeping or managing your Housekeeping? Y  Comment Maid assist  Some recent data might be hidden    Patient Care Team: Leone Haven, MD as PCP - General (Family Medicine) Bary Castilla Forest Gleason, MD (General Surgery)    Assessment:   This is a routine wellness examination for Whitney Simmons.  I connected with patient 12/21/18 at 11:00 AM EDT by a video/audio enabled telemedicine application and verified that I am speaking with the correct person using two identifiers. Patient stated full name and DOB. Patient gave permission to continue with virtual visit. Patient's location was at home and Nurse's location was at [place] office.   Health Screenings  Mammogram - 01/2018 Colonoscopy - 12/2010 Bone Density - 01/2015 Glaucoma -yes; drops in use Hearing -hearing aids Labs followed by pcp  Dental- UTD Vision- visits within the last 12 months.  Social  Alcohol intake - no        Smoking history- never    Smokers in home? none Illicit drug use? none Exercise - walking Diet - regular Sexually Active -not currently BMI- discussed the importance of a healthy diet, water intake and the benefits of aerobic exercise.  Educational material provided.   Safety  Patient feels safe at home- yes Patient does have smoke detectors at home- yes Patient does wear sunscreen or protective clothing when in direct sunlight -yes Patient does wear seat belt when in a moving  vehicle -yes Patient drives- yes Patient has life alert- yes  Covid-19 precautions and sickness symptoms discussed.   Activities of Daily Living Patient denies needing assistance with: driving, feeding themselves, getting from bed to chair, getting to the toilet, bathing/showering, dressing, managing money, or preparing meals.  Daughter helps manage finances. Maid assist with household chores when available. Facility prepares meals; she does not cook stove top but has use of microwave.   Depression Screen Patient denies losing interest in daily life, feeling hopeless, or crying easily over simple problems.   Medication-taking as directed and without issues.   Fall Screen Patient denies being afraid of falling or falling in the last year.   Memory Screen Patient is alert.  Patient denies difficulty focusing, concentrating or misplacing items. Correctly identified the president of the [place] , season and recall. Patient likes to read some and do crafts for brain stimulation.  Immunizations The following Immunizations were discussed: Influenza, shingles, pneumonia, and tetanus.   Other Providers Patient Care Team: Leone Haven, MD as PCP - General (Family Medicine) Bary Castilla Forest Gleason, MD (General Surgery)  Exercise Activities and Dietary recommendations Current Exercise Habits: Home exercise routine, Type  of exercise: walking, Intensity: Mild  Goals      Patient Stated   . Blood Pressure < 140/90 (pt-stated)     Purchase new blood pressure cuff and log for pcp as directed       Fall Risk Fall Risk  12/21/2018 05/31/2018 05/29/2017 02/25/2016 01/23/2015  Falls in the past year? 0 0 No No No  Number falls in past yr: - 0 - - -  Injury with Fall? - 0 - - -  Risk for fall due to : - - - - -   Depression Screen PHQ 2/9 Scores 12/21/2018 05/31/2018 05/29/2017 02/25/2016  PHQ - 2 Score 0 0 0 0  PHQ- 9 Score - - 0 -     Cognitive Function MMSE - Mini Mental State Exam 05/29/2017  Orientation to time 5  Orientation to Place 5  Registration 3  Attention/ Calculation 5  Recall 2  Language- name 2 objects 2  Language- repeat 1  Language- follow 3 step command 3  Language- read & follow direction 1  Write a sentence 1  Copy design 1  Total score 29     6CIT Screen 12/21/2018  What Year? 0 points  What month? 0 points  What time? 0 points  Count back from 20 0 points  Months in reverse 0 points    Immunization History  Administered Date(s) Administered  . Influenza Split 03/30/2012  . Influenza, High Dose Seasonal PF 03/18/2016, 04/30/2018  . Influenza,inj,Quad PF,6+ Mos 03/25/2014  . Influenza-Unspecified 05/03/2013, 04/26/2015, 03/31/2017  . Pneumococcal Conjugate-13 12/09/2013  . Pneumococcal Polysaccharide-23 01/27/2011   Screening Tests Health Maintenance  Topic Date Due  . INFLUENZA VACCINE  01/29/2019  . TETANUS/TDAP  06/02/2027  . DEXA SCAN  Completed  . PNA vac Low Risk Adult  Completed      Plan:    End of life planning; Advance aging; Advanced directives discussed.  Copy of current HCPOA/Living Will requested.    I have personally reviewed and noted the following in the patient's chart:   . Medical and social history . Use of alcohol, tobacco or illicit drugs  . Current medications and supplements . Functional ability and status . Nutritional status .  Physical activity . Advanced directives . List of other physicians . Hospitalizations, surgeries, and ER visits  in previous 12 months . Vitals . Screenings to include cognitive, depression, and falls . Referrals and appointments  In addition, I have reviewed and discussed with patient certain preventive protocols, quality metrics, and best practice recommendations. A written personalized care plan for preventive services as well as general preventive health recommendations were provided to patient.     Varney Biles, LPN  9/[id number]/7800

## 2019-01-05 ENCOUNTER — Other Ambulatory Visit: Payer: Self-pay

## 2019-01-05 ENCOUNTER — Other Ambulatory Visit (INDEPENDENT_AMBULATORY_CARE_PROVIDER_SITE_OTHER): Payer: PPO

## 2019-01-05 DIAGNOSIS — I10 Essential (primary) hypertension: Secondary | ICD-10-CM | POA: Diagnosis not present

## 2019-01-05 DIAGNOSIS — E039 Hypothyroidism, unspecified: Secondary | ICD-10-CM

## 2019-01-05 LAB — BASIC METABOLIC PANEL
BUN: 21 mg/dL (ref 6–23)
CO2: 25 mEq/L (ref 19–32)
Calcium: 8.5 mg/dL (ref 8.4–10.5)
Chloride: 106 mEq/L (ref 96–112)
Creatinine, Ser: 1.23 mg/dL — ABNORMAL HIGH (ref 0.40–1.20)
GFR: 41.1 mL/min — ABNORMAL LOW (ref >60.00)
Glucose, Bld: 99 mg/dL (ref 70–99)
Potassium: 4 mEq/L (ref 3.5–5.1)
Sodium: 140 mEq/L (ref 135–145)

## 2019-01-05 LAB — TSH: TSH: 0.17 u[IU]/mL — ABNORMAL LOW (ref 0.35–4.50)

## 2019-01-08 ENCOUNTER — Encounter: Payer: Self-pay | Admitting: Family Medicine

## 2019-01-10 ENCOUNTER — Ambulatory Visit: Payer: Self-pay

## 2019-01-10 MED ORDER — LEVOTHYROXINE SODIUM 88 MCG PO TABS: ORAL_TABLET | ORAL | 1 refills | Status: DC

## 2019-01-10 NOTE — Telephone Encounter (Signed)
Responded to patient's MyChart message. 

## 2019-01-10 NOTE — Telephone Encounter (Signed)
Pt called stating that she is waiting for instructions on what dose of Levothyroxine she is to take. Per lab note 7/8 she feels Dr Caryl Bis wanted to change her dose. Pt states she takes her levothyroxine every morning without other medications and waits as he has instructed in the past before eating. She states she is retaining fluid. Per Butch Penny at office note will be routed. Pt will wait for call back today.  Reason for Disposition . [1] Caller has URGENT medication question about med that PCP or specialist prescribed AND [2] triager unable to answer question  Answer Assessment - Initial Assessment Questions 1.   NAME of MEDICATION: "What medicine are you calling about?"     Levothyroxine 2.   QUESTION: "What is your question?"    Do I change dose 3.   PRESCRIBING HCP: "Who prescribed it?" Reason: if prescribed by specialist, call should be referred to that group.     [place] 4. SYMPTOMS: "Do you have any symptoms?"     None  Fluid retension 5. SEVERITY: If symptoms are present, ask "Are they mild, moderate or severe?"     no 6.  PREGNANCY:  "Is there any chance that you are pregnant?" "When was your last menstrual period?"    N/A  Protocols used: MEDICATION QUESTION CALL-A-AH

## 2019-01-11 ENCOUNTER — Telehealth: Payer: Self-pay

## 2019-01-11 NOTE — Telephone Encounter (Signed)
Noted.  Plan to see in office.

## 2019-01-11 NOTE — Telephone Encounter (Signed)
Please call the patient.  The prescription she picked up last week was a refill from her prior prescription that was sent in prior to her labs being checked.  I had not been able to send in the new dose of her levothyroxine at that time and thus had not sent the new dose in until this week.  The dose that was sent in this week is the correct dose and if needed we can explain this to her insurance company.

## 2019-01-13 ENCOUNTER — Telehealth: Payer: Self-pay

## 2019-01-13 ENCOUNTER — Telehealth: Payer: Self-pay | Admitting: *Deleted

## 2019-01-13 NOTE — Telephone Encounter (Signed)
Copied from [place] [id number]. Topic: General - Other >> Jan 13, 2019 12:00 PM [name] wrote: Reason for CRM:   Patient would like to cancel her appt for tomorrow 01/14/2019.  She said she will reschedule it if she has anymore problems.

## 2019-01-13 NOTE — Telephone Encounter (Signed)
Dr Bary Castilla notified and he did speak with the patient.

## 2019-01-13 NOTE — Telephone Encounter (Signed)
Appt cancelled

## 2019-01-13 NOTE — Telephone Encounter (Signed)
Patient states this is her second call today.   The patient is concerned about a breast lump that she has at previous incision site. States she has a hard time raising her arm, feels that arm is tight, and area warm to the touch.   Patient is in the recalls but was told that mammography is behind on scheduling mammograms due to [place].   I did offer to make patient an appointment for office evaluation but she is asking that we speak with Dr. Bary Castilla first.   The patient wants to see how she can speed the process up and wants to know if she needs an office appointment first or mammogram.   Note routed to [facility] Clinical Pool.

## 2019-01-14 ENCOUNTER — Ambulatory Visit: Payer: PPO | Admitting: Family Medicine

## 2019-01-18 ENCOUNTER — Ambulatory Visit: Payer: PPO | Admitting: Family Medicine

## 2019-01-18 ENCOUNTER — Telehealth: Payer: Self-pay | Admitting: *Deleted

## 2019-01-18 ENCOUNTER — Encounter: Payer: Self-pay | Admitting: General Surgery

## 2019-01-18 NOTE — Telephone Encounter (Signed)
Patient will have a letter set up for mammograms and follow up with Dr. Canary Brim.

## 2019-01-18 NOTE — Telephone Encounter (Signed)
Please call patient to get her set up for a  mammogram and to see either Dr.Pabon or Dr.Piscoya, she is in recalls for September

## 2019-01-31 DIAGNOSIS — H401121 Primary open-angle glaucoma, left eye, mild stage: Secondary | ICD-10-CM | POA: Diagnosis not present

## 2019-02-03 ENCOUNTER — Other Ambulatory Visit: Payer: Self-pay

## 2019-02-03 DIAGNOSIS — D0512 Intraductal carcinoma in situ of left breast: Secondary | ICD-10-CM

## 2019-03-03 ENCOUNTER — Encounter: Payer: Self-pay | Admitting: Family Medicine

## 2019-03-03 DIAGNOSIS — E039 Hypothyroidism, unspecified: Secondary | ICD-10-CM

## 2019-03-09 DIAGNOSIS — M9902 Segmental and somatic dysfunction of thoracic region: Secondary | ICD-10-CM | POA: Diagnosis not present

## 2019-03-09 DIAGNOSIS — M5136 Other intervertebral disc degeneration, lumbar region: Secondary | ICD-10-CM | POA: Diagnosis not present

## 2019-03-09 DIAGNOSIS — M5134 Other intervertebral disc degeneration, thoracic region: Secondary | ICD-10-CM | POA: Diagnosis not present

## 2019-03-09 DIAGNOSIS — M9903 Segmental and somatic dysfunction of lumbar region: Secondary | ICD-10-CM | POA: Diagnosis not present

## 2019-03-14 DIAGNOSIS — M5136 Other intervertebral disc degeneration, lumbar region: Secondary | ICD-10-CM | POA: Diagnosis not present

## 2019-03-14 DIAGNOSIS — M5134 Other intervertebral disc degeneration, thoracic region: Secondary | ICD-10-CM | POA: Diagnosis not present

## 2019-03-14 DIAGNOSIS — H401121 Primary open-angle glaucoma, left eye, mild stage: Secondary | ICD-10-CM | POA: Diagnosis not present

## 2019-03-14 DIAGNOSIS — M9903 Segmental and somatic dysfunction of lumbar region: Secondary | ICD-10-CM | POA: Diagnosis not present

## 2019-03-14 DIAGNOSIS — M9902 Segmental and somatic dysfunction of thoracic region: Secondary | ICD-10-CM | POA: Diagnosis not present

## 2019-03-15 ENCOUNTER — Other Ambulatory Visit: Payer: PPO

## 2019-03-16 ENCOUNTER — Other Ambulatory Visit: Payer: PPO

## 2019-03-18 ENCOUNTER — Other Ambulatory Visit (INDEPENDENT_AMBULATORY_CARE_PROVIDER_SITE_OTHER): Payer: PPO

## 2019-03-18 ENCOUNTER — Other Ambulatory Visit: Payer: Self-pay

## 2019-03-18 ENCOUNTER — Ambulatory Visit (INDEPENDENT_AMBULATORY_CARE_PROVIDER_SITE_OTHER): Payer: PPO

## 2019-03-18 DIAGNOSIS — Z23 Encounter for immunization: Secondary | ICD-10-CM

## 2019-03-18 DIAGNOSIS — E039 Hypothyroidism, unspecified: Secondary | ICD-10-CM

## 2019-03-18 LAB — TSH: TSH: 1.11 u[IU]/mL (ref 0.35–4.50)

## 2019-03-21 DIAGNOSIS — M9903 Segmental and somatic dysfunction of lumbar region: Secondary | ICD-10-CM | POA: Diagnosis not present

## 2019-03-21 DIAGNOSIS — M9902 Segmental and somatic dysfunction of thoracic region: Secondary | ICD-10-CM | POA: Diagnosis not present

## 2019-03-21 DIAGNOSIS — M5134 Other intervertebral disc degeneration, thoracic region: Secondary | ICD-10-CM | POA: Diagnosis not present

## 2019-03-21 DIAGNOSIS — M5136 Other intervertebral disc degeneration, lumbar region: Secondary | ICD-10-CM | POA: Diagnosis not present

## 2019-03-23 ENCOUNTER — Ambulatory Visit
Admission: RE | Admit: 2019-03-23 | Discharge: 2019-03-23 | Disposition: A | Discharge status: Home / Self Care | Payer: PPO | Source: Ambulatory Visit | Attending: Surgery | Admitting: Surgery

## 2019-03-23 DIAGNOSIS — D0512 Intraductal carcinoma in situ of left breast: Secondary | ICD-10-CM | POA: Insufficient documentation

## 2019-03-23 DIAGNOSIS — R928 Other abnormal and inconclusive findings on diagnostic imaging of breast: Secondary | ICD-10-CM | POA: Diagnosis not present

## 2019-03-24 ENCOUNTER — Ambulatory Visit (INDEPENDENT_AMBULATORY_CARE_PROVIDER_SITE_OTHER): Payer: PPO | Admitting: Podiatry

## 2019-03-24 ENCOUNTER — Telehealth: Payer: Self-pay | Admitting: *Deleted

## 2019-03-24 ENCOUNTER — Encounter: Payer: Self-pay | Admitting: Podiatry

## 2019-03-24 ENCOUNTER — Other Ambulatory Visit: Payer: Self-pay

## 2019-03-24 DIAGNOSIS — M79676 Pain in unspecified toe(s): Secondary | ICD-10-CM

## 2019-03-24 DIAGNOSIS — B351 Tinea unguium: Secondary | ICD-10-CM

## 2019-03-24 NOTE — Progress Notes (Signed)
Complaint:  Visit Type: Patient returns to my office for continued preventative foot care services. Complaint: Patient states" my nails have grown long and thick and become painful to walk and wear shoes". The patient presents for preventative foot care services. No changes to ROS.    Podiatric Exam: Vascular: dorsalis pedis and posterior tibial pulses are diminished   bilateral. Capillary return is immediate. Temperature gradient is WNL. Skin turgor WNL  Sensorium: Normal Semmes Weinstein monofilament test. Normal tactile sensation bilaterally. Nail Exam: Pt has thick disfigured discolored nails with subungual debris noted bilateral entire nail hallux through fifth toenails.   Ulcer Exam: There is no evidence of ulcer or pre-ulcerative changes or infection. Orthopedic Exam: Muscle tone and strength are WNL. No limitations in general ROM. No crepitus or effusions noted.  Contracted  Hammer toe fifth right.  Amputation of fifth toe left foot . Skin: No Porokeratosis. No infection or ulcers. Asymptomatic clavi second right.  Diagnosis:  Onychomycosis, , Pain in right toe, pain in left toes Clavi  Treatment & Plan Procedures and Treatment: Consent by patient was obtained for treatment procedures. The patient understood the discussion of treatment and procedures well. All questions were answered thoroughly reviewed. Debridement of mycotic and hypertrophic toenails, 1 through 5 bilateral and clearing of subungual debris. No ulceration, no infection noted.  Return Visit-Office Procedure: Patient instructed to return to the office for a follow up visit 3 months for continued evaluation and treatment.     Gardiner Barefoot DPM

## 2019-03-24 NOTE — Telephone Encounter (Signed)
Mammogram normal follow up on 03/30/2019.

## 2019-03-28 DIAGNOSIS — M5136 Other intervertebral disc degeneration, lumbar region: Secondary | ICD-10-CM | POA: Diagnosis not present

## 2019-03-28 DIAGNOSIS — M5134 Other intervertebral disc degeneration, thoracic region: Secondary | ICD-10-CM | POA: Diagnosis not present

## 2019-03-28 DIAGNOSIS — H903 Sensorineural hearing loss, bilateral: Secondary | ICD-10-CM | POA: Diagnosis not present

## 2019-03-28 DIAGNOSIS — M9902 Segmental and somatic dysfunction of thoracic region: Secondary | ICD-10-CM | POA: Diagnosis not present

## 2019-03-28 DIAGNOSIS — M9903 Segmental and somatic dysfunction of lumbar region: Secondary | ICD-10-CM | POA: Diagnosis not present

## 2019-03-28 DIAGNOSIS — H6123 Impacted cerumen, bilateral: Secondary | ICD-10-CM | POA: Diagnosis not present

## 2019-03-30 ENCOUNTER — Ambulatory Visit (INDEPENDENT_AMBULATORY_CARE_PROVIDER_SITE_OTHER): Payer: PPO | Admitting: Surgery

## 2019-03-30 ENCOUNTER — Other Ambulatory Visit: Payer: Self-pay

## 2019-03-30 ENCOUNTER — Encounter: Payer: Self-pay | Admitting: Surgery

## 2019-03-30 VITALS — Ht 66.0 in | Wt 176.8 lb

## 2019-03-30 DIAGNOSIS — D0512 Intraductal carcinoma in situ of left breast: Secondary | ICD-10-CM | POA: Diagnosis not present

## 2019-03-30 NOTE — Patient Instructions (Signed)
Please call if you have questions or concerns.  

## 2019-04-02 ENCOUNTER — Encounter: Payer: Self-pay | Admitting: Surgery

## 2019-04-02 NOTE — Progress Notes (Signed)
Outpatient Surgical Follow Up  04/02/2019  Whitney Simmons is an 85 y.o. female.   Chief Complaint  Patient presents with  . Follow-up    mammogram    HPI: 85 yo female f/w after mammo. 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 Doing well. No new masses, no DC, no nipple issues. Mammo personally reviewed and no evidence of concerning lesions. PT ask me not to do a breast exam today. She also wishes not to pursue any furhter images given her age. I had a good conversation w her and this is very reasonable  Past Medical History:  Diagnosis Date  . Breast cancer ([place]) 2015   LT LUMPECTOMY  . Breast microcalcification, mammographic 01/31/2014  . 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 Declined postoperative radiation and antiestrogen therapy.  . Edema   . Hyperlipidemia   . Hypertension   . IBS (irritable bowel syndrome)   . Irregular heart beat   . Thyroid disease    s/p radioactive iodine ablation 30 years ago    Past Surgical History:  Procedure Laterality Date  . BREAST LUMPECTOMY Left 2015   DUCTAL CARCINOMA IN SITU WITH CALCIFICATIONS  . BREAST SURGERY Left 02-15-14   wide excision  . CATARACT EXTRACTION    . CHOLECYSTECTOMY    . TOE AMPUTATION     left 5th toe    Family History  Problem Relation Age of Onset  . Heart disease Mother   . Heart disease Maternal Aunt   . Breast cancer Neg Hx     Social History:  reports that she has never smoked. She has never used smokeless tobacco. She reports that she does not drink alcohol or use drugs.  Allergies:  Allergies  Allergen Reactions  . Levaquin [Levofloxacin In D5w] Other (See Comments)    hallucinations  . Sulfa Antibiotics     Hallucinations     Medications reviewed.    ROS Full ROS performed and is otherwise negative other than what is stated in HPI   Ht 5\' 6"  (1.676 m)   Wt 176 lb 12.8 oz (80.2 kg)   BMI 28.54 kg/m   Physical Exam Vitals signs  and nursing note reviewed.  Constitutional:      Appearance: Normal appearance. She is normal weight.  Pulmonary:     Effort: Pulmonary effort is normal. No respiratory distress.  Skin:    Capillary Refill: Capillary refill takes less than 2 seconds.  Neurological:     General: No focal deficit present.     Mental Status: She is alert and oriented to person, place, and time.  Psychiatric:        Mood and Affect: Mood normal.        Behavior: Behavior normal.        Thought Content: Thought content normal.        Judgment: Judgment normal.       Assessment/Plan: 85 yo female w nml mammo and per pt no new breast concerns. No f/u or images indicated per pt wishes. F/U prn   Greater than 50% of the 25 minutes  visit was spent in counseling/coordination of care   Caroleen Hamman, MD [place] Surgeon

## 2019-04-18 DIAGNOSIS — M5136 Other intervertebral disc degeneration, lumbar region: Secondary | ICD-10-CM | POA: Diagnosis not present

## 2019-04-18 DIAGNOSIS — M9903 Segmental and somatic dysfunction of lumbar region: Secondary | ICD-10-CM | POA: Diagnosis not present

## 2019-04-18 DIAGNOSIS — M5134 Other intervertebral disc degeneration, thoracic region: Secondary | ICD-10-CM | POA: Diagnosis not present

## 2019-04-18 DIAGNOSIS — M9902 Segmental and somatic dysfunction of thoracic region: Secondary | ICD-10-CM | POA: Diagnosis not present

## 2019-05-09 DIAGNOSIS — M5136 Other intervertebral disc degeneration, lumbar region: Secondary | ICD-10-CM | POA: Diagnosis not present

## 2019-05-09 DIAGNOSIS — M9902 Segmental and somatic dysfunction of thoracic region: Secondary | ICD-10-CM | POA: Diagnosis not present

## 2019-05-09 DIAGNOSIS — M9903 Segmental and somatic dysfunction of lumbar region: Secondary | ICD-10-CM | POA: Diagnosis not present

## 2019-05-09 DIAGNOSIS — M5134 Other intervertebral disc degeneration, thoracic region: Secondary | ICD-10-CM | POA: Diagnosis not present

## 2019-06-07 ENCOUNTER — Other Ambulatory Visit: Payer: Self-pay

## 2019-06-07 ENCOUNTER — Ambulatory Visit (INDEPENDENT_AMBULATORY_CARE_PROVIDER_SITE_OTHER): Payer: PPO | Admitting: Family Medicine

## 2019-06-07 ENCOUNTER — Encounter: Payer: Self-pay | Admitting: Family Medicine

## 2019-06-07 DIAGNOSIS — E039 Hypothyroidism, unspecified: Secondary | ICD-10-CM | POA: Diagnosis not present

## 2019-06-07 DIAGNOSIS — K580 Irritable bowel syndrome with diarrhea: Secondary | ICD-10-CM | POA: Diagnosis not present

## 2019-06-07 DIAGNOSIS — Z853 Personal history of malignant neoplasm of breast: Secondary | ICD-10-CM | POA: Diagnosis not present

## 2019-06-07 DIAGNOSIS — M810 Age-related osteoporosis without current pathological fracture: Secondary | ICD-10-CM | POA: Diagnosis not present

## 2019-06-07 DIAGNOSIS — I10 Essential (primary) hypertension: Secondary | ICD-10-CM

## 2019-06-07 NOTE — Progress Notes (Signed)
Virtual Visit via telephone Note  This visit type was conducted due to national recommendations for restrictions regarding the COVID-19 pandemic (e.g. social distancing).  This format is felt to be most appropriate for this patient at this time.  All issues noted in this document were discussed and addressed.  No physical exam was performed (except for noted visual exam findings with Video Visits).   I connected with Whitney Simmons today at  3:00 PM EST by telephone and verified that I am speaking with the correct person using two identifiers. Location patient: home Location provider: home office Persons participating in the virtual visit: patient, provider  I discussed the limitations, risks, security and privacy concerns of performing an evaluation and management service by telephone and the availability of in person appointments. I also discussed with the patient that there may be a patient responsible charge related to this service. The patient expressed understanding and agreed to proceed.  Interactive audio and video telecommunications were attempted between this provider and patient, however failed, due to patient having technical difficulties OR patient did not have access to video capability.  We continued and completed visit with audio only.  Reason for visit: Follow-up of  HPI: History of breast cancer: Patient had a negative mammogram and followed up with surgery.  She did not want any further follow-up on this and the surgeon felt this was reasonable.  Hypertension: Not checking blood pressures.  She is taking losartan, Bystolic, and Lasix.  No chest pain or shortness of breath.  She has some mild lower extremity swelling every few days.  This goes down overnight.  No orthopnea or PND.  She wonders if she can take an extra dose of Lasix when that occurs.  Hypothyroidism: Taking Synthroid.  No skin changes.  She has chronic cold intolerance that is unchanged.  No fatigue.   Osteoporosis: She has not been on any medications.  She is reluctant to go out to have a follow-up DEXA scan given the COVID-19 pandemic.  IBS: Patient reports immediately after eating she will feel as though she has to have a bowel movement and she will have bloating.  She will have some diarrhea.  She will occasionally have constipation.  She takes a stool softener for the constipation.  She takes Imodium to help with the diarrhea though does so after her first episode of diarrhea.  She had some left upper quadrant discomfort prior to the diarrhea though no significant pain.   ROS: See pertinent positives and negatives per HPI.  Past Medical History:  Diagnosis Date  . Breast cancer ([place]) 2015   LT LUMPECTOMY  . Breast microcalcification, mammographic 01/31/2014  . 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 Declined postoperative radiation and antiestrogen therapy.  . Edema   . Hyperlipidemia   . Hypertension   . IBS (irritable bowel syndrome)   . Irregular heart beat   . Thyroid disease    s/p radioactive iodine ablation 30 years ago    Past Surgical History:  Procedure Laterality Date  . BREAST LUMPECTOMY Left 2015   DUCTAL CARCINOMA IN SITU WITH CALCIFICATIONS  . BREAST SURGERY Left 02-15-14   wide excision  . CATARACT EXTRACTION    . CHOLECYSTECTOMY    . TOE AMPUTATION     left 5th toe    Family History  Problem Relation Age of Onset  . Heart disease Mother   . Heart disease Maternal Aunt   . Breast  cancer Neg Hx     SOCIAL HX: Non-smoker   Current Outpatient Medications:  .  aspirin 81 MG tablet, Take 81 mg by mouth daily., Disp: , Rfl:  .  furosemide (LASIX) 20 MG tablet, Take 1 tablet (20 mg total) by mouth daily., Disp: 90 tablet, Rfl: 2 .  latanoprost (XALATAN) 0.005 % ophthalmic solution, INSTILL 1 DROP INTO EACH EYE ONCE DAILY, Disp: , Rfl:  .  levothyroxine (SYNTHROID) 88 MCG tablet, TAKE 1 TABLET BY MOUTH ONCE DAILY  BEFORE BREAKFAST, Disp: 90 tablet, Rfl: 1 .  losartan (COZAAR) 50 MG tablet, Take 1 tablet (50 mg total) by mouth daily., Disp: 90 tablet, Rfl: 2 .  Multiple Vitamin (MULTIVITAMIN) tablet, Take 1 tablet by mouth daily., Disp: , Rfl:  .  nebivolol (BYSTOLIC) 5 MG tablet, Take 1 tablet (5 mg total) by mouth daily., Disp: 90 tablet, Rfl: 2 .  polyethylene glycol powder (GLYCOLAX/MIRALAX) powder, Take 17 g by mouth daily as needed for mild constipation., Disp: 500 g, Rfl: 0  EXAM: This was a telehealth telephone visit and thus no physical exam was completed.   ASSESSMENT AND PLAN:  Discussed the following assessment and plan:  Hypertension She will continue her current medication.  Plan for lab work.  IBS (irritable bowel syndrome) Mixed type with diarrhea and constipation.  She can continue stool softener when she feels constipated.  Discussed taking Imodium 45 minutes prior to eating each meal to see if that is beneficial.  If not helpful she will let us know.  Hypothyroidism Check TSH.  Osteoporosis She declines follow-up DEXA scan relating to the COVID-19 pandemic.  She can have this done when she feels safe enough to do so.  History of breast cancer She has deferred any further follow-up regarding this.  I think this is reasonable given her age.    I discussed the assessment and treatment plan with the patient. The patient was provided an opportunity to ask questions and all were answered. The patient agreed with the plan and demonstrated an understanding of the instructions.   The patient was advised to call back or seek an in-person evaluation if the symptoms worsen or if the condition fails to improve as anticipated.  I provided 21 minutes of non-face-to-face time during this encounter.   Tommi Rumps, MD

## 2019-06-09 ENCOUNTER — Telehealth: Payer: Self-pay | Admitting: Family Medicine

## 2019-06-09 NOTE — Assessment & Plan Note (Signed)
Mixed type with diarrhea and constipation.  She can continue stool softener when she feels constipated.  Discussed taking Imodium 45 minutes prior to eating each meal to see if that is beneficial.  If not helpful she will let us know.

## 2019-06-09 NOTE — Assessment & Plan Note (Signed)
She will continue her current medication.  Plan for lab work.

## 2019-06-09 NOTE — Assessment & Plan Note (Signed)
She declines follow-up DEXA scan relating to the COVID-19 pandemic.  She can have this done when she feels safe enough to do so.

## 2019-06-09 NOTE — Telephone Encounter (Signed)
I did not get this patient's follow-up put in after her visit earlier this week.  Can you call her and get her scheduled for lab work and a BP check with nursing sometime in the next several weeks and follow-up with me in 6 months?  Thanks.

## 2019-06-09 NOTE — Assessment & Plan Note (Signed)
She has deferred any further follow-up regarding this.  I think this is reasonable given her age.

## 2019-06-09 NOTE — Assessment & Plan Note (Signed)
Check TSH 

## 2019-06-15 ENCOUNTER — Telehealth: Payer: Self-pay | Admitting: Family Medicine

## 2019-06-15 NOTE — Telephone Encounter (Signed)
She needs to be evaluated for this. She needs an in person exam given the abdominal pain. I would suggest she be seen at urgent care for this as she would not pass the screening criteria to come in to the office.

## 2019-06-15 NOTE — Telephone Encounter (Signed)
I informed patient that she needed to be evaluated in person and to go to urgetn care, patient stated thanks for calling.  Whitney Simmons,cma

## 2019-06-15 NOTE — Telephone Encounter (Signed)
Patient states she has IBS and she is having a brown discharge and she can not tell if it is from her vagina or not because of the IBS, she is having abdominal pain as well and the pain comes and goes.  She has no appetite because she is experiencing some nausea.  Please advise.  Hartlyn Reigel,cma

## 2019-06-15 NOTE — Telephone Encounter (Signed)
Pt states she is noticing a brown discharge and pain lower part of abdomen for about 1 week off and on. Please advise and Thank you!  Call pt @ [phone].

## 2019-06-16 ENCOUNTER — Encounter: Payer: Self-pay | Admitting: Emergency Medicine

## 2019-06-16 ENCOUNTER — Encounter: Payer: Self-pay | Admitting: Family Medicine

## 2019-06-16 ENCOUNTER — Ambulatory Visit: Payer: PPO | Admitting: Podiatry

## 2019-06-16 ENCOUNTER — Ambulatory Visit
Admission: EM | Admit: 2019-06-16 | Discharge: 2019-06-16 | Disposition: A | Discharge status: Home / Self Care | Payer: PPO | Attending: Nurse Practitioner | Admitting: Nurse Practitioner

## 2019-06-16 ENCOUNTER — Other Ambulatory Visit: Payer: Self-pay

## 2019-06-16 DIAGNOSIS — N3001 Acute cystitis with hematuria: Secondary | ICD-10-CM | POA: Insufficient documentation

## 2019-06-16 LAB — POCT URINALYSIS DIP (MANUAL ENTRY)
Bilirubin, UA: NEGATIVE
Glucose, UA: NEGATIVE mg/dL
Ketones, POC UA: NEGATIVE mg/dL
Nitrite, UA: NEGATIVE
Protein Ur, POC: 30 mg/dL — AB
Spec Grav, UA: 1.02 (ref 1.010–1.025)
Urobilinogen, UA: 0.2 E.U./dL
pH, UA: 7 (ref 5.0–8.0)

## 2019-06-16 MED ORDER — FLUCONAZOLE 150 MG PO TABS: 150.0000 mg | ORAL_TABLET | ORAL | 0 refills | Status: AC

## 2019-06-16 MED ORDER — CEPHALEXIN 500 MG PO CAPS: 500.0000 mg | ORAL_CAPSULE | Freq: Four times a day (QID) | ORAL | 0 refills | Status: AC

## 2019-06-16 MED ORDER — PHENAZOPYRIDINE HCL 200 MG PO TABS: 200.0000 mg | ORAL_TABLET | Freq: Three times a day (TID) | ORAL | 0 refills | Status: DC

## 2019-06-16 NOTE — ED Triage Notes (Signed)
Patient in office today c/o UTI ?  [id number]

## 2019-06-16 NOTE — ED Provider Notes (Signed)
Whitney Simmons    CSN: [id number] Arrival date & time: 06/16/19  1305      History   Chief Complaint Chief Complaint  Patient presents with  . Urinary Tract Infection    HPI Whitney Simmons is a 85 y.o. female.   Subjective:  Whitney Simmons is a 85 y.o. female who complains of dysuria, urinary frequency, suprapubic pressure, lower back pain and brownish colored vaginal discharge for 4 days. Patient denies fever, nausea, vomiting or flank pain. Patient does not have a history of recurrent UTI.  Patient does not have a history of pyelonephritis.  The following portions of the patient's history were reviewed and updated as appropriate: allergies, current medications, past family history, past medical history, past social history, past surgical history and problem list.        Past Medical History:  Diagnosis Date  . Breast cancer ([place]) 2015   LT LUMPECTOMY  . Breast microcalcification, mammographic 01/31/2014  . 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 Declined postoperative radiation and antiestrogen therapy.  . Edema   . Hyperlipidemia   . Hypertension   . IBS (irritable bowel syndrome)   . Irregular heart beat   . Thyroid disease    s/p radioactive iodine ablation 30 years ago    Patient Active Problem List   Diagnosis Date Noted  . Difficulty hearing 12/06/2018  . Bruising 05/31/2018  . Gait instability 09/22/2017  . Nevus 05/29/2017  . Callus of foot 01/05/2017  . Thrombocytopenia ([place]) 11/06/2015  . Osteoporosis 03/07/2015  . Postmenopausal estrogen deficiency 01/23/2015  . Glaucoma 05/12/2014  . History of breast cancer 02/13/2014  . IBS (irritable bowel syndrome) 12/09/2013  . Palpitations 09/01/2012  . Chronic kidney disease, stage III (moderate) 08/18/2012  . Anxiety 08/02/2012  . Hypothyroidism 07/30/2012  . Hyperlipidemia 07/30/2012  . Hypertension 07/30/2012    Past Surgical History:    Procedure Laterality Date  . BREAST LUMPECTOMY Left 2015   DUCTAL CARCINOMA IN SITU WITH CALCIFICATIONS  . BREAST SURGERY Left 02-15-14   wide excision  . CATARACT EXTRACTION    . CHOLECYSTECTOMY    . TOE AMPUTATION     left 5th toe    OB History   No obstetric history on file.      Home Medications    Prior to Admission medications   Medication Sig Start Date End Date Taking? Authorizing Provider  aspirin 81 MG tablet Take 81 mg by mouth daily.    [provider]  cephALEXin (KEFLEX) 500 MG capsule Take 1 capsule (500 mg total) by mouth 4 (four) times daily for 7 days. 06/16/19 06/23/19  Enrique Sack, FNP  fluconazole (DIFLUCAN) 150 MG tablet Take 1 tablet (150 mg total) by mouth every 3 (three) days for 2 doses. 06/16/19 06/20/19  Enrique Sack, FNP  furosemide (LASIX) 20 MG tablet Take 1 tablet (20 mg total) by mouth daily. 12/06/18 09/02/19  Leone Haven, MD  latanoprost (XALATAN) 0.005 % ophthalmic solution INSTILL 1 DROP INTO EACH EYE ONCE DAILY 01/31/19   [provider]  levothyroxine (SYNTHROID) 88 MCG tablet TAKE 1 TABLET BY MOUTH ONCE DAILY BEFORE BREAKFAST 01/10/19   Leone Haven, MD  losartan (COZAAR) 50 MG tablet Take 1 tablet (50 mg total) by mouth daily. 12/06/18   Leone Haven, MD  Multiple Vitamin (MULTIVITAMIN) tablet Take 1 tablet by mouth daily.    [provider]  nebivolol (BYSTOLIC) 5 MG  tablet Take 1 tablet (5 mg total) by mouth daily. 12/06/18   Leone Haven, MD  phenazopyridine (PYRIDIUM) 200 MG tablet Take 1 tablet (200 mg total) by mouth 3 (three) times daily. 06/16/19   Enrique Sack, FNP  polyethylene glycol powder (GLYCOLAX/MIRALAX) powder Take 17 g by mouth daily as needed for mild constipation. 05/31/18   Leone Haven, MD    Family History Family History  Problem Relation Age of Onset  . Heart disease Mother   . Heart disease Maternal Aunt   . Breast cancer Neg Hx     Social  History Social History   Tobacco Use  . Smoking status: Never Smoker  . Smokeless tobacco: Never Used  Substance Use Topics  . Alcohol use: No  . Drug use: No     Allergies   Levaquin [levofloxacin in d5w] and Sulfa antibiotics   Review of Systems Review of Systems  Constitutional: Negative for fever.  Gastrointestinal: Negative for nausea and vomiting.  Genitourinary: Positive for dysuria and vaginal discharge. Negative for flank pain and frequency.  Musculoskeletal: Positive for back pain.  All other systems reviewed and are negative.    Physical Exam Triage Vital Signs ED Triage Vitals  Enc Vitals Group     BP      Pulse      Resp      Temp      Temp src      SpO2      Weight      Height      Head Circumference      Peak Flow      Pain Score      Pain Loc      Pain Edu?      Excl. in [place]?    No data found.  Updated Vital Signs BP 140/66   Pulse 96   Temp 97.7 F (36.5 C) (Oral)   Resp 16   Wt 171 lb (77.6 kg)   SpO2 99%   BMI 27.60 kg/m   Visual Acuity Right Eye Distance:   Left Eye Distance:   Bilateral Distance:    Right Eye Near:   Left Eye Near:    Bilateral Near:     Physical Exam Vitals reviewed.  Constitutional:      Appearance: Normal appearance.  HENT:     Head: Normocephalic.  Cardiovascular:     Rate and Rhythm: Normal rate and regular rhythm.  Pulmonary:     Effort: Pulmonary effort is normal.     Breath sounds: Normal breath sounds.  Musculoskeletal:        General: Normal range of motion.     Cervical back: Normal range of motion and neck supple.  Skin:    General: Skin is warm and dry.  Neurological:     General: No focal deficit present.     Mental Status: She is alert and oriented to person, place, and time.      UC Treatments / Results  Labs (all labs ordered are listed, but only abnormal results are displayed) Labs Reviewed  POCT URINALYSIS DIP (MANUAL ENTRY) - Abnormal; Notable for the following  components:      Result Value   Clarity, UA cloudy (*)    Blood, UA large (*)    Protein Ur, POC =30 (*)    Leukocytes, UA Moderate (2+) (*)    All other components within normal limits  URINE CULTURE    EKG   Radiology No results  found.  Procedures Procedures (including critical care time)  Medications Ordered in UC Medications - No data to display  Initial Impression / Assessment and Plan / UC Course  I have reviewed the triage vital signs and the nursing notes.  Pertinent labs & imaging results that were available during my care of the patient were reviewed by me and considered in my medical decision making (see chart for details).    85 yo female presenting with a four-day history of ale who complains of dysuria, urinary frequency, suprapubic pressure, lower back pain and brownish colored vaginal discharge. Patient is afebrile. Nontoxic appearing. Physical exam unremarkable. UA indicative of a UTI. Urine cultures pending.  1. Medications: ciprofloxacin, diflucan, pyridium  2. Maintain adequate hydration 3. Follow up with PCP if symptoms not improving  Today's evaluation has revealed no signs of a dangerous process. Discussed diagnosis with patient and/or guardian. Patient and/or guardian aware of their diagnosis, possible red flag symptoms to watch out for and need for close follow up. Patient and/or guardian understands verbal and written discharge instructions. Patient and/or guardian comfortable with plan and disposition.  Patient and/or guardian has a clear mental status at this time, good insight into illness (after discussion and teaching) and has clear judgment to make decisions regarding their care  This care was provided during an unprecedented National Emergency due to the Novel Coronavirus (COVID-19) pandemic. COVID-19 infections and transmission risks place heavy strains on healthcare resources.  As this pandemic evolves, our facility, providers, and staff strive  to respond fluidly, to remain operational, and to provide care relative to available resources and information. Outcomes are unpredictable and treatments are without well-defined guidelines. Further, the impact of COVID-19 on all aspects of urgent care, including the impact to patients seeking care for reasons other than COVID-19, is unavoidable during this national emergency. At this time of the global pandemic, management of patients has significantly changed, even for non-COVID positive patients given high local and regional COVID volumes at this time requiring high healthcare system and resource utilization. The standard of care for management of both COVID suspected and non-COVID suspected patients continues to change rapidly at the local, regional, national, and global levels. This patient was worked up and treated to the best available but ever changing evidence and resources available at this current time.   Documentation was completed with the aid of voice recognition software. Transcription may contain typographical errors. Final Clinical Impressions(s) / UC Diagnoses   Final diagnoses:  Acute cystitis with hematuria     Discharge Instructions     . Take medications as prescribed  . Drink plenty of fluids  . Follow-up with your doctor in 1 week   Merry Christmas!!!  Aldona Bar, [employer]     ED Prescriptions    Medication Sig Dispense Auth. Provider   cephALEXin (KEFLEX) 500 MG capsule Take 1 capsule (500 mg total) by mouth 4 (four) times daily for 7 days. 28 capsule Enrique Sack, FNP   fluconazole (DIFLUCAN) 150 MG tablet Take 1 tablet (150 mg total) by mouth every 3 (three) days for 2 doses. 2 tablet Enrique Sack, FNP   phenazopyridine (PYRIDIUM) 200 MG tablet Take 1 tablet (200 mg total) by mouth 3 (three) times daily. 6 tablet Enrique Sack, FNP     PDMP not reviewed this encounter.   Enrique Sack, [place] 06/16/19 1330

## 2019-06-16 NOTE — Discharge Instructions (Addendum)
Take medications as prescribed  Drink plenty of fluids  Follow-up with your doctor in 1 week   Merry Christmas!!!  Aldona Bar, FNP-C

## 2019-06-17 ENCOUNTER — Telehealth: Payer: Self-pay | Admitting: Family Medicine

## 2019-06-17 ENCOUNTER — Encounter: Payer: Self-pay | Admitting: Family Medicine

## 2019-06-17 LAB — URINE CULTURE
Culture: NO GROWTH
Special Requests: NORMAL

## 2019-06-17 NOTE — Telephone Encounter (Signed)
LMTCB. [place] for [employer] to advise.  Her culture did not grow bacteria.therefore she does not need to take the antibiotic called phenazopyride (pyridium 200 mg tablet.  I would suggest she only take the diflucan that was prescribed as she does not have a UTI. We do need to recheck her urine as there was some blood on the dipstick. If her symptoms are not improving with the diflucan we can consider different treatment once a repeat urine is completed. Rockey Guarino,cma

## 2019-06-17 NOTE — Telephone Encounter (Signed)
Her culture did not grow bacteria. I would suggest she only take the diflucan that was prescribed as she does not have a UTI. We do need to recheck her urine as there was some blood on the dipstick. If her symptoms are not improving with the diflucan we can consider different treatment once a repeat urine is completed.

## 2019-06-17 NOTE — Telephone Encounter (Signed)
See phone note from today.

## 2019-06-17 NOTE — Telephone Encounter (Signed)
Patient went across the street from office to [facility] care. She has a UTI, and was given 3 different medications. She is concerned about the phenazopyridine (PYRIDIUM) 200 MG tablet affecting her kidney's. Please advise patient does not know if she should take medication or not. Please call her [phone].

## 2019-06-17 NOTE — Telephone Encounter (Signed)
Patient went across the street from office to [facility]gent care. She has a UTI, and was given 3 different medications. She is concerned about the phenazopyridine (PYRIDIUM) 200 MG tablet affecting her kidney's. Please advise patient does not know if she should take medication or not.  Araf Clugston,cma

## 2019-06-17 NOTE — Telephone Encounter (Signed)
Called and informed the patient to not take the antibiotic or the pain medication, to only take the diflucan and if any symptoms to call back, patient understood.  Clance Baquero,cma

## 2019-06-27 ENCOUNTER — Encounter: Payer: Self-pay | Admitting: Family Medicine

## 2019-06-28 ENCOUNTER — Telehealth: Payer: Self-pay

## 2019-06-28 ENCOUNTER — Other Ambulatory Visit: Payer: Self-pay | Admitting: Family Medicine

## 2019-06-28 ENCOUNTER — Other Ambulatory Visit (INDEPENDENT_AMBULATORY_CARE_PROVIDER_SITE_OTHER): Payer: PPO

## 2019-06-28 ENCOUNTER — Other Ambulatory Visit: Payer: Self-pay

## 2019-06-28 ENCOUNTER — Ambulatory Visit (INDEPENDENT_AMBULATORY_CARE_PROVIDER_SITE_OTHER): Payer: PPO

## 2019-06-28 VITALS — BP 142/60 | HR 75 | Temp 96.1°F

## 2019-06-28 DIAGNOSIS — E039 Hypothyroidism, unspecified: Secondary | ICD-10-CM

## 2019-06-28 DIAGNOSIS — I10 Essential (primary) hypertension: Secondary | ICD-10-CM | POA: Diagnosis not present

## 2019-06-28 LAB — COMPREHENSIVE METABOLIC PANEL
ALT: 7 U/L (ref 0–35)
AST: 13 U/L (ref 0–37)
Albumin: 3.9 g/dL (ref 3.5–5.2)
Alkaline Phosphatase: 72 U/L (ref 39–117)
BUN: 19 mg/dL (ref 6–23)
CO2: 23 mEq/L (ref 19–32)
Calcium: 8.9 mg/dL (ref 8.4–10.5)
Chloride: 104 mEq/L (ref 96–112)
Creatinine, Ser: 1.39 mg/dL — ABNORMAL HIGH (ref 0.40–1.20)
GFR: 35.65 mL/min — ABNORMAL LOW (ref >60.00)
Glucose, Bld: 114 mg/dL — ABNORMAL HIGH (ref 70–99)
Potassium: 4.2 mEq/L (ref 3.5–5.1)
Sodium: 137 mEq/L (ref 135–145)
Total Bilirubin: 0.5 mg/dL (ref 0.2–1.2)
Total Protein: 6.8 g/dL (ref 6.0–8.3)

## 2019-06-28 LAB — TSH: TSH: 2.16 u[IU]/mL (ref 0.35–4.50)

## 2019-06-28 NOTE — Progress Notes (Signed)
Santiago Glad called from lab needed to reorder labs.orders in

## 2019-06-28 NOTE — Progress Notes (Signed)
Patient came in today for BP check done in left arm with regular adult cuff. BP reading was 142/60. Patient was feeling fine, so I let her go. I am sending to you for review. Patient also has in person appointment with you 12/31 @ 11am.

## 2019-06-29 ENCOUNTER — Telehealth: Payer: Self-pay | Admitting: Family Medicine

## 2019-06-29 ENCOUNTER — Other Ambulatory Visit: Payer: Self-pay

## 2019-06-29 ENCOUNTER — Other Ambulatory Visit (INDEPENDENT_AMBULATORY_CARE_PROVIDER_SITE_OTHER): Payer: PPO

## 2019-06-29 DIAGNOSIS — I10 Essential (primary) hypertension: Secondary | ICD-10-CM

## 2019-06-29 DIAGNOSIS — E039 Hypothyroidism, unspecified: Secondary | ICD-10-CM | POA: Diagnosis not present

## 2019-06-29 LAB — COMPREHENSIVE METABOLIC PANEL
ALT: 6 U/L (ref 0–35)
AST: 15 U/L (ref 0–37)
Albumin: 3.9 g/dL (ref 3.5–5.2)
Alkaline Phosphatase: 72 U/L (ref 39–117)
BUN: 20 mg/dL (ref 6–23)
CO2: 25 mEq/L (ref 19–32)
Calcium: 8.9 mg/dL (ref 8.4–10.5)
Chloride: 103 mEq/L (ref 96–112)
Creatinine, Ser: 1.24 mg/dL — ABNORMAL HIGH (ref 0.40–1.20)
GFR: 40.68 mL/min — ABNORMAL LOW (ref >60.00)
Glucose, Bld: 94 mg/dL (ref 70–99)
Potassium: 4.2 mEq/L (ref 3.5–5.1)
Sodium: 136 mEq/L (ref 135–145)
Total Bilirubin: 0.6 mg/dL (ref 0.2–1.2)
Total Protein: 6.8 g/dL (ref 6.0–8.3)

## 2019-06-29 LAB — CBC WITH DIFFERENTIAL/PLATELET
Basophils Absolute: 0.1 10*3/uL (ref 0.0–0.1)
Basophils Relative: 1.6 % (ref 0.0–3.0)
Eosinophils Absolute: 0.1 10*3/uL (ref 0.0–0.7)
Eosinophils Relative: 1.7 % (ref 0.0–5.0)
HCT: 43 % (ref 36.0–46.0)
Hemoglobin: 13.8 g/dL (ref 12.0–15.0)
Lymphocytes Relative: 20.5 % (ref 12.0–46.0)
Lymphs Abs: 1.6 10*3/uL (ref 0.7–4.0)
MCHC: 32 g/dL (ref 30.0–36.0)
MCV: 91.9 fl (ref 78.0–100.0)
Monocytes Absolute: 0.8 10*3/uL (ref 0.1–1.0)
Monocytes Relative: 9.7 % (ref 3.0–12.0)
Neutro Abs: 5.2 10*3/uL (ref 1.4–7.7)
Neutrophils Relative %: 66.5 % (ref 43.0–77.0)
Platelets: 208 10*3/uL (ref 150.0–400.0)
RBC: 4.68 Mil/uL (ref 3.87–5.11)
RDW: 14 % (ref 11.5–15.5)
WBC: 7.8 10*3/uL (ref 4.0–10.5)

## 2019-06-29 LAB — TSH: TSH: 2.21 u[IU]/mL (ref 0.35–4.50)

## 2019-06-29 NOTE — Telephone Encounter (Signed)
I called pt twice and left a vm to call ofc. °

## 2019-06-29 NOTE — Telephone Encounter (Signed)
Whitney Simmons will RTC for redraw 12/30. She is on the lab schedule and is aware of reason for redraw.  I reassured Whitney Simmons that the lab equipment was not functioning properly and for her not to worry about the values being reflections of anything wrong with her (as explained to me by Santiago Glad from the lab). Also reassured her that her results would be in her chart before her scheduled appt. With Dr. Caryl Bis.

## 2019-06-30 ENCOUNTER — Encounter: Payer: Self-pay | Admitting: Family Medicine

## 2019-06-30 ENCOUNTER — Ambulatory Visit (INDEPENDENT_AMBULATORY_CARE_PROVIDER_SITE_OTHER): Payer: PPO | Admitting: Family Medicine

## 2019-06-30 ENCOUNTER — Other Ambulatory Visit: Payer: Self-pay

## 2019-06-30 ENCOUNTER — Other Ambulatory Visit (HOSPITAL_COMMUNITY)
Admission: RE | Admit: 2019-06-30 | Discharge: 2019-06-30 | Disposition: A | Discharge status: Home / Self Care | Payer: PPO | Source: Ambulatory Visit | Attending: Family Medicine | Admitting: Family Medicine

## 2019-06-30 VITALS — BP 140/70 | HR 60 | Temp 96.5°F | Ht 66.0 in | Wt 174.0 lb

## 2019-06-30 DIAGNOSIS — N950 Postmenopausal bleeding: Secondary | ICD-10-CM

## 2019-06-30 DIAGNOSIS — N898 Other specified noninflammatory disorders of vagina: Secondary | ICD-10-CM | POA: Insufficient documentation

## 2019-06-30 NOTE — Progress Notes (Signed)
Tommi Rumps, MD Phone: [phone]  Whitney Simmons is a 85 y.o. female who presents today for same-day visit.  Vaginal discharge: Patient notes this is been going on for about a month.  She started with some pressure sensation and then developed discharge that has been clear mucus were slightly brown.  She notes occasional discomfort in her abdomen though nothing significant.  Some itching when she urinates.  No dysuria.  She has had blood on her pad on a couple of occasions though she has trouble telling if it is from her vagina or her rectum.  No fevers.  She went to urgent care and had a urinalysis though no exam.  UTI was ruled out with negative culture.  She is not sexually active.  Social History   Tobacco Use  Smoking Status Never Smoker  Smokeless Tobacco Never Used     ROS see history of present illness  Objective  Physical Exam Vitals:   06/30/19 1106  BP: 140/70  Pulse: 60  Temp: (!) 96.5 F (35.8 C)    BP Readings from Last 3 Encounters:  06/30/19 140/70  06/28/19 (!) 142/60  06/16/19 140/66   Wt Readings from Last 3 Encounters:  06/30/19 174 lb (78.9 kg)  06/16/19 171 lb (77.6 kg)  06/07/19 176 lb (79.8 kg)    Physical Exam Constitutional:      General: She is not in acute distress.    Appearance: She is not diaphoretic.  Cardiovascular:     Rate and Rhythm: Normal rate and regular rhythm.     Heart sounds: Normal heart sounds.  Pulmonary:     Effort: Pulmonary effort is normal.     Breath sounds: Normal breath sounds.  Abdominal:     General: Bowel sounds are normal. There is no distension.     Palpations: Abdomen is soft.     Tenderness: There is no abdominal tenderness. There is no guarding or rebound.  Genitourinary:    Comments: Fulton Mole, CMA served as chaperone for this exam, right labia with extra tissue projecting out in a fingerlike shape, this is soft, it is skin colored, otherwise normal external genitalia, atrophic vaginal  mucosa, creamy discharge noted in the vaginal vault, normal cervix with no discharge from the cervical os, no cervical motion tenderness, no adnexal tenderness or masses Skin:    General: Skin is warm and dry.  Neurological:     Mental Status: She is alert.      Assessment/Plan: Please see individual problem list.  Vaginal discharge New onset issue.  Discharge noted on exam.  Will send for BV and yeast infection testing.  Will refer to gynecology given report of blood.  They can also evaluate the extra tissue seen in the right labia.    Orders Placed This Encounter  Procedures  . Ambulatory referral to Gynecology    Referral Priority:   Routine    Referral Type:   Consultation    Referral Reason:   Specialty Services Required    Requested Specialty:   Gynecology    Number of Visits Requested:   1    No orders of the defined types were placed in this encounter.   This visit occurred during the SARS-CoV-2 public health emergency.  Safety protocols were in place, including screening questions prior to the visit, additional usage of staff PPE, and extensive cleaning of exam room while observing appropriate contact time as indicated for disinfecting solutions.    Tommi Rumps, MD [facility] -  [employer]

## 2019-06-30 NOTE — Assessment & Plan Note (Signed)
New onset issue.  Discharge noted on exam.  Will send for BV and yeast infection testing.  Will refer to gynecology given report of blood.  They can also evaluate the extra tissue seen in the right labia.

## 2019-06-30 NOTE — Patient Instructions (Signed)
Nice to see you. We will get you set up with gynecology. We will call you with your swab results when they return.

## 2019-07-04 ENCOUNTER — Telehealth: Payer: Self-pay | Admitting: Obstetrics & Gynecology

## 2019-07-04 NOTE — Telephone Encounter (Signed)
[facility] referring for Postmenopausal bleeding. Called and left voicemail for patient to call back to be schedule

## 2019-07-05 ENCOUNTER — Other Ambulatory Visit: Payer: Self-pay | Admitting: Family Medicine

## 2019-07-05 LAB — CERVICOVAGINAL ANCILLARY ONLY
Bacterial Vaginitis (gardnerella): NEGATIVE
Candida Glabrata: POSITIVE — AB
Comment: NEGATIVE
Comment: NEGATIVE
Comment: NEGATIVE
Trichomonas: NEGATIVE

## 2019-07-05 MED ORDER — FLUCONAZOLE 150 MG PO TABS: 150.0000 mg | ORAL_TABLET | ORAL | 0 refills | Status: DC

## 2019-07-12 ENCOUNTER — Other Ambulatory Visit: Payer: Self-pay | Admitting: Family Medicine

## 2019-07-13 NOTE — Telephone Encounter (Signed)
Pt called follow up on her rx refill. Thank you!

## 2019-07-14 ENCOUNTER — Encounter: Payer: Self-pay | Admitting: Family Medicine

## 2019-07-15 ENCOUNTER — Telehealth: Payer: Self-pay

## 2019-07-15 DIAGNOSIS — E039 Hypothyroidism, unspecified: Secondary | ICD-10-CM

## 2019-07-15 NOTE — Telephone Encounter (Signed)
Patient called upset that the pharmacy changed her manufacturer for the levothyroxine and she read reviews on the Internet and was crying because she did not want to take the medicine.  After speaking with Dr. Caryl Bis I called her back and assured her it was the same medication and that she needed to do labs in 6 weeks to see her values. I reassured her of taking the medication and she understood and she scheduled her lab appointment in 6 weeks and I ordered the TSH for labs.  Dequandre Cordova,cma

## 2019-07-28 ENCOUNTER — Ambulatory Visit: Payer: PPO | Admitting: Podiatry

## 2019-08-01 ENCOUNTER — Encounter: Payer: Self-pay | Admitting: Family Medicine

## 2019-08-02 ENCOUNTER — Encounter: Payer: PPO | Admitting: Obstetrics and Gynecology

## 2019-08-11 ENCOUNTER — Encounter: Payer: PPO | Admitting: Obstetrics and Gynecology

## 2019-08-17 ENCOUNTER — Ambulatory Visit (INDEPENDENT_AMBULATORY_CARE_PROVIDER_SITE_OTHER): Payer: PPO | Admitting: Obstetrics and Gynecology

## 2019-08-17 ENCOUNTER — Encounter: Payer: Self-pay | Admitting: Obstetrics and Gynecology

## 2019-08-17 ENCOUNTER — Other Ambulatory Visit: Payer: Self-pay

## 2019-08-17 VITALS — BP 124/60 | Ht 67.0 in | Wt 174.0 lb

## 2019-08-17 DIAGNOSIS — N950 Postmenopausal bleeding: Secondary | ICD-10-CM

## 2019-08-17 NOTE — Progress Notes (Signed)
Patient ID: Whitney Simmons, female   DOB: Nov 17, 1927, 86 y.o.   MRN: [id number]  Reason for Consult: Referral (Having some brown discharge,and  feels might also have a prolapse, she might also have a yeast infection )   Referred by Leone Haven, MD  Subjective:     HPI:  Whitney Simmons is a 86 y.o. female.  She presents today as a referral from her primary care physician for postmenopausal bleeding.  She reports that she has been having small brown bleeding for 2 to 3 months with a mucus-like discharge.  She sometimes sees traces of red.  She was examined by Dr. Caryl Bis who felt something like a finger.  She has had the sensation of things "falling out" of her vagina.  She has not been having any itching burning or pain.  She does not have any pelvic pain.  She does have a history of urinary incontinence.  She wears a incontinence pad to help with this.  She is satisfied with this management and reports that her urinary leakage is not a problem.  She does have a history of irritable bowel syndrome.  She has not had any recent changes in her bowel movements.  She denies any nausea vomiting.  She reports she entered menopause sometime in her 38s but is unsure of the exact age.  She denies any history of fibroids polyps or ovarian cyst.  She has never had any abnormal Pap smears.  She reports that Dr. Caryl Bis felt a fingerlike projection on her exam.  She wonders if this was related to birth trauma she had from her breech delivery.   Whitney Simmons states very strongly that she would not desire a hysterectomy if this bleeding was related to a uterine problem.  She feels like she is too old to recover successfully from hysterectomy.  She has a history of 2 vaginal deliveries 62 female fetus full-term breech delivery complicated by laceration 1959 female fetus full-term vaginal delivery uncomplicated    Past Medical History:  Diagnosis Date  . Breast cancer ([place]) 2015   LT LUMPECTOMY  .  Breast microcalcification, mammographic 01/31/2014  . 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 Declined postoperative radiation and antiestrogen therapy.  . Edema   . Hyperlipidemia   . Hypertension   . IBS (irritable bowel syndrome)   . Irregular heart beat   . Thyroid disease    s/p radioactive iodine ablation 30 years ago   Family History  Problem Relation Age of Onset  . Heart disease Mother   . Heart disease Maternal Aunt   . Breast cancer Neg Hx    Past Surgical History:  Procedure Laterality Date  . BREAST LUMPECTOMY Left 2015   DUCTAL CARCINOMA IN SITU WITH CALCIFICATIONS  . BREAST SURGERY Left 02-15-14   wide excision  . CATARACT EXTRACTION    . CHOLECYSTECTOMY    . TOE AMPUTATION     left 5th toe    Short Social History:  Social History   Tobacco Use  . Smoking status: Never Smoker  . Smokeless tobacco: Never Used  Substance Use Topics  . Alcohol use: No    Allergies  Allergen Reactions  . Levaquin [Levofloxacin In D5w] Other (See Comments)    hallucinations  . Sulfa Antibiotics     Hallucinations     Current Outpatient Medications  Medication Sig Dispense Refill  . aspirin 81 MG tablet Take 81 mg by mouth  daily.    Arna Medici 88 MCG tablet TAKE 1 TABLET BY MOUTH ONCE DAILY BEFORE BREAKFAST 90 tablet 0  . fluconazole (DIFLUCAN) 150 MG tablet Take 1 tablet (150 mg total) by mouth every 3 (three) days. 2 tablet 0  . furosemide (LASIX) 20 MG tablet Take 1 tablet (20 mg total) by mouth daily. 90 tablet 2  . latanoprost (XALATAN) 0.005 % ophthalmic solution INSTILL 1 DROP INTO EACH EYE ONCE DAILY    . losartan (COZAAR) 50 MG tablet Take 1 tablet (50 mg total) by mouth daily. 90 tablet 2  . Multiple Vitamin (MULTIVITAMIN) tablet Take 1 tablet by mouth daily.    . nebivolol (BYSTOLIC) 5 MG tablet Take 1 tablet (5 mg total) by mouth daily. 90 tablet 2  . phenazopyridine (PYRIDIUM) 200 MG tablet Take 1 tablet (200 mg  total) by mouth 3 (three) times daily. 6 tablet 0  . polyethylene glycol powder (GLYCOLAX/MIRALAX) powder Take 17 g by mouth daily as needed for mild constipation. 500 g 0   No current facility-administered medications for this visit.    Review of Systems  Constitutional: Negative for chills, fatigue, fever and unexpected weight change.  HENT: Negative for trouble swallowing.  Eyes: Negative for loss of vision.  Respiratory: Negative for cough, shortness of breath and wheezing.  Cardiovascular: Negative for chest pain, leg swelling, palpitations and syncope.  GI: Negative for abdominal pain, blood in stool, diarrhea, nausea and vomiting.  GU: Negative for difficulty urinating, dysuria, frequency and hematuria.  Musculoskeletal: Negative for back pain, leg pain and joint pain.  Skin: Negative for rash.  Neurological: Negative for dizziness, headaches, light-headedness, numbness and seizures.  Psychiatric: Negative for behavioral problem, confusion, depressed mood and sleep disturbance.       Objective:  Objective   Vitals:   08/17/19 0918  BP: 124/60  Weight: 174 lb (78.9 kg)  Height: 5\' 7"  (1.702 m)   Body mass index is 27.25 kg/m.  Physical Exam Vitals and nursing note reviewed.  Constitutional:      Appearance: She is well-developed.  HENT:     Head: Normocephalic and atraumatic.  Eyes:     Pupils: Pupils are equal, round, and reactive to light.  Cardiovascular:     Rate and Rhythm: Normal rate and regular rhythm.  Pulmonary:     Effort: Pulmonary effort is normal. No respiratory distress.  Abdominal:     General: Abdomen is flat.     Palpations: Abdomen is soft.  Genitourinary:    Comments: External: Normal appearing vulva. Right labia is long, No lesions noted.  Speculum examination: Normal appearing cervix. No blood in the vaginal vault. Scant yellow discharge.  Bimanual examination: Uterus midline, non-tender, normal in size, shape and contour.  No CMT. No  adnexal masses. No adnexal tenderness. Pelvis not fixed.  Skin:    General: Skin is warm and dry.  Neurological:     Mental Status: She is alert and oriented to person, place, and time.  Psychiatric:        Behavior: Behavior normal.        Thought Content: Thought content normal.        Judgment: Judgment normal.         Assessment/Plan:     86 year old with postmenopausal bleeding.  Will have her follow-up for a pelvic ultrasound to evaluate the endometrium, uterus and ovaries.  Atrophic discharge present on exam.  This is normal at her age and has largely been nonbothersome to the patient.  This could be treated with once weekly iodine douches if desired.  No evidence of yeast infection.  Exam somewhat limited but there does not to appear to be significant prolapse or complete procidentia at this time.  Given that patient is currently unbothered by urinary incontinence or prolapse would not recommend further treatment at this time.   More than 30 minutes were spent face to face with the patient in the room with more than 50% of the time spent providing counseling and discussing the plan of management.   Adrian Prows MD [facility], [facility] 08/17/2019 10:19 AM

## 2019-08-25 ENCOUNTER — Ambulatory Visit: Payer: PPO | Admitting: Podiatry

## 2019-08-25 ENCOUNTER — Encounter: Payer: Self-pay | Admitting: Podiatry

## 2019-08-25 ENCOUNTER — Other Ambulatory Visit: Payer: Self-pay

## 2019-08-25 DIAGNOSIS — L84 Corns and callosities: Secondary | ICD-10-CM

## 2019-08-25 DIAGNOSIS — B351 Tinea unguium: Secondary | ICD-10-CM | POA: Diagnosis not present

## 2019-08-25 DIAGNOSIS — M79676 Pain in unspecified toe(s): Secondary | ICD-10-CM

## 2019-08-25 NOTE — Progress Notes (Signed)
Complaint:  Visit Type: Patient returns to my office for continued preventative foot care services. Complaint: Patient states" my nails have grown long and thick and become painful to walk and wear shoes". The patient presents for preventative foot care services. No changes to ROS.    Podiatric Exam: Vascular: dorsalis pedis and posterior tibial pulses are diminished   bilateral. Capillary return is immediate. Temperature gradient is WNL. Skin turgor WNL  Sensorium: Normal Semmes Weinstein monofilament test. Normal tactile sensation bilaterally. Nail Exam: Pt has thick disfigured discolored nails with subungual debris noted bilateral entire nail hallux through fifth toenails.   Ulcer Exam: There is no evidence of ulcer or pre-ulcerative changes or infection. Orthopedic Exam: Muscle tone and strength are WNL. No limitations in general ROM. No crepitus or effusions noted.  Contracted  Hammer toe fifth right.  Amputation of fifth toe left foot . Skin: No Porokeratosis. No infection or ulcers. Asymptomatic clavi second right.  Diagnosis:  Onychomycosis, , Pain in right toe, pain in left toes Clavi  Treatment & Plan Procedures and Treatment: Consent by patient was obtained for treatment procedures. The patient understood the discussion of treatment and procedures well. All questions were answered thoroughly reviewed. Debridement of mycotic and hypertrophic toenails, 1 through 5 bilateral and clearing of subungual debris. No ulceration, no infection noted.  Return Visit-Office Procedure: Patient instructed to return to the office for a follow up visit 3 months for continued evaluation and treatment.     Gardiner Barefoot DPM

## 2019-09-01 ENCOUNTER — Telehealth: Payer: Self-pay | Admitting: *Deleted

## 2019-09-01 NOTE — Telephone Encounter (Signed)
Please place future orders for lab appt.  

## 2019-09-02 ENCOUNTER — Encounter: Payer: Self-pay | Admitting: Family Medicine

## 2019-09-02 ENCOUNTER — Emergency Department
Admission: EM | Admit: 2019-09-02 | Discharge: 2019-09-02 | Disposition: A | Discharge status: Home / Self Care | Payer: PPO | Attending: Emergency Medicine | Admitting: Emergency Medicine

## 2019-09-02 ENCOUNTER — Other Ambulatory Visit: Payer: Self-pay

## 2019-09-02 ENCOUNTER — Other Ambulatory Visit: Payer: PPO

## 2019-09-02 DIAGNOSIS — Z853 Personal history of malignant neoplasm of breast: Secondary | ICD-10-CM | POA: Insufficient documentation

## 2019-09-02 DIAGNOSIS — E039 Hypothyroidism, unspecified: Secondary | ICD-10-CM | POA: Diagnosis not present

## 2019-09-02 DIAGNOSIS — I959 Hypotension, unspecified: Secondary | ICD-10-CM | POA: Diagnosis not present

## 2019-09-02 DIAGNOSIS — N183 Chronic kidney disease, stage 3 unspecified: Secondary | ICD-10-CM | POA: Diagnosis not present

## 2019-09-02 DIAGNOSIS — R442 Other hallucinations: Secondary | ICD-10-CM | POA: Diagnosis not present

## 2019-09-02 DIAGNOSIS — Z9049 Acquired absence of other specified parts of digestive tract: Secondary | ICD-10-CM | POA: Insufficient documentation

## 2019-09-02 DIAGNOSIS — Z79899 Other long term (current) drug therapy: Secondary | ICD-10-CM | POA: Insufficient documentation

## 2019-09-02 DIAGNOSIS — R441 Visual hallucinations: Secondary | ICD-10-CM | POA: Diagnosis not present

## 2019-09-02 DIAGNOSIS — Z7982 Long term (current) use of aspirin: Secondary | ICD-10-CM | POA: Diagnosis not present

## 2019-09-02 DIAGNOSIS — I129 Hypertensive chronic kidney disease with stage 1 through stage 4 chronic kidney disease, or unspecified chronic kidney disease: Secondary | ICD-10-CM | POA: Diagnosis not present

## 2019-09-02 DIAGNOSIS — R0902 Hypoxemia: Secondary | ICD-10-CM | POA: Diagnosis not present

## 2019-09-02 LAB — CBC
HCT: 41 % (ref 36.0–46.0)
Hemoglobin: 13.3 g/dL (ref 12.0–15.0)
MCH: 30 pg (ref 26.0–34.0)
MCHC: 32.4 g/dL (ref 30.0–36.0)
MCV: 92.3 fL (ref 80.0–100.0)
Platelets: 193 10*3/uL (ref 150–400)
RBC: 4.44 MIL/uL (ref 3.87–5.11)
RDW: 13.2 % (ref 11.5–15.5)
WBC: 8.9 10*3/uL (ref 4.0–10.5)
nRBC: 0 % (ref 0.0–0.2)

## 2019-09-02 LAB — COMPREHENSIVE METABOLIC PANEL
ALT: 8 U/L (ref 0–44)
AST: 14 U/L — ABNORMAL LOW (ref 15–41)
Albumin: 3.8 g/dL (ref 3.5–5.0)
Alkaline Phosphatase: 70 U/L (ref 38–126)
Anion gap: 7 (ref 5–15)
BUN: 20 mg/dL (ref 8–23)
CO2: 28 mmol/L (ref 22–32)
Calcium: 9.2 mg/dL (ref 8.9–10.3)
Chloride: 104 mmol/L (ref 98–111)
Creatinine, Ser: 1.16 mg/dL — ABNORMAL HIGH (ref 0.44–1.00)
GFR calc Af Amer: 48 mL/min — ABNORMAL LOW (ref >60)
GFR calc non Af Amer: 42 mL/min — ABNORMAL LOW (ref >60)
Glucose, Bld: 102 mg/dL — ABNORMAL HIGH (ref 70–99)
Potassium: 4.4 mmol/L (ref 3.5–5.1)
Sodium: 139 mmol/L (ref 135–145)
Total Bilirubin: 0.8 mg/dL (ref 0.3–1.2)
Total Protein: 7.2 g/dL (ref 6.5–8.1)

## 2019-09-02 NOTE — Consult Note (Signed)
[facility] Face-to-Face Psychiatry Consult   Reason for Consult: Visual hallucinations Referring Physician: Dr. Corky Downs Patient Identification: Whitney Simmons MRN:  [id number] Principal Diagnosis: <principal problem not specified> Diagnosis:  Active Problems:   * No active hospital problems. *   Total Time spent with patient: 30 minutes  Subjective:   Whitney Simmons is a 86 y.o. female patient who presented with visual hallucinations.  HPI: Patient is a 86 year old female with no significant past psychiatric history who presents with complaint of visual hallucinations.  Per the patient she has seen in the past a movement of shadowy-like figures as well as at one time thinking that there was a growth on her leg.  Patient also described an episode recently where s she saw that chandelier in her house fall and break despite it not doing so.  Patient states that these are new symptoms that have just arise within the last month or so.  She states that she is not distressed by this as she is able to have some insight into them and realized that it is just her "mind playing tricks" on her.  Patient denies any previous psychiatric diagnosis or any other previous visual hallucinations.  She denies any dementia or migraines.  Patient states that she feels safe returning home at this time.  No delusions or false beliefs evident.  Patient states that she had recently changed her medication for her thyroid approximately 1 month ago and she has felt some changes to her body including increased anxiety and tremors.  Patient was educated that while this medication specifically is probably not responsible for her visual hallucinations, that thyroid states can sometimes lead to psychosis.  Patient has no other symptoms of psychosis aside from the visual hallucinations.  Past Psychiatric History: None  Risk to Self:  No Risk to Others:  No Prior Inpatient Therapy:  No Prior Outpatient Therapy:  No  Past Medical  History:  Past Medical History:  Diagnosis Date  . Breast cancer ([place]) 2015   LT LUMPECTOMY  . Breast microcalcification, mammographic 01/31/2014  . 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 Declined postoperative radiation and antiestrogen therapy.  . Edema   . Hyperlipidemia   . Hypertension   . IBS (irritable bowel syndrome)   . Irregular heart beat   . Thyroid disease    s/p radioactive iodine ablation 30 years ago    Past Surgical History:  Procedure Laterality Date  . BREAST LUMPECTOMY Left 2015   DUCTAL CARCINOMA IN SITU WITH CALCIFICATIONS  . BREAST SURGERY Left 02-15-14   wide excision  . CATARACT EXTRACTION    . CHOLECYSTECTOMY    . TOE AMPUTATION     left 5th toe   Family History:  Family History  Problem Relation Age of Onset  . Heart disease Mother   . Heart disease Maternal Aunt   . Breast cancer Neg Hx    Family Psychiatric  History: Denies Social History:  Social History   Substance and Sexual Activity  Alcohol Use No     Social History   Substance and Sexual Activity  Drug Use No    Social History   Socioeconomic History  . Marital status: Married    Spouse name: Not on file  . Number of children: Not on file  . Years of education: Not on file  . Highest education level: Not on file  Occupational History  . Not on file  Tobacco Use  .  Smoking status: Never Smoker  . Smokeless tobacco: Never Used  Substance and Sexual Activity  . Alcohol use: No  . Drug use: No  . Sexual activity: Not Currently    Birth control/protection: Post-menopausal  Other Topics Concern  . Not on file  Social History Narrative   Lives in [place] at [place]. From [place]. Has daughter.      Work- Retired Psychologist, prison and probation services Strain: [place]   . Difficulty of Paying Living Expenses: Not hard at all  Food Insecurity: No Food Insecurity  . Worried About Sales executive in the Last Year: Never true  . Ran Out of Food in the Last Year: Never true  Transportation Needs: No Transportation Needs  . Lack of Transportation (Medical): No  . Lack of Transportation (Non-Medical): No  Physical Activity:   . Days of Exercise per Week: Not on file  . Minutes of Exercise per Session: Not on file  Stress: No Stress Concern Present  . Feeling of Stress : Not at all  Social Connections:   . Frequency of Communication with Friends and Family: Not on file  . Frequency of Social Gatherings with Friends and Family: Not on file  . Attends Religious Services: Not on file  . Active Member of Clubs or Organizations: Not on file  . Attends Archivist Meetings: Not on file  . Marital Status: Not on file   Additional Social History: Patient states that she lives at home.  She has a daughter close by who comes to check on her.  She reports that she used to live with her husband but he has now been moved to an assisted living facility.    Allergies:   Allergies  Allergen Reactions  . Levaquin [Levofloxacin In D5w] Other (See Comments)    hallucinations  . Sulfa Antibiotics     Hallucinations     Labs:  Results for orders placed or performed during the hospital encounter of 09/02/19 (from the past 48 hour(s))  CBC     Status: None   Collection Time: 09/02/19 11:11 AM  Result Value Ref Range   WBC 8.9 4.0 - 10.5 K/uL   RBC 4.44 3.87 - 5.11 MIL/uL   Hemoglobin 13.3 12.0 - 15.0 g/dL   HCT 41.0 36.0 - 46.0 %   MCV 92.3 80.0 - 100.0 fL   MCH 30.0 26.0 - 34.0 pg   MCHC 32.4 30.0 - 36.0 g/dL   RDW 13.2 11.5 - 15.5 %   Platelets 193 150 - 400 K/uL   nRBC 0.0 0.0 - 0.2 %    Comment: Performed at [facility], [address] [place], [place] [address]  Comprehensive metabolic panel     Status: Abnormal   Collection Time: 09/02/19 11:11 AM  Result Value Ref Range   Sodium 139 135 - 145 mmol/L   Potassium 4.4 3.5 - 5.1 mmol/L   Chloride 104 98 -  111 mmol/L   CO2 28 22 - 32 mmol/L   Glucose, Bld 102 (H) 70 - 99 mg/dL    Comment: Glucose reference range applies only to samples taken after fasting for at least 8 hours.   BUN 20 8 - 23 mg/dL   Creatinine, Ser 1.16 (H) 0.44 - 1.00 mg/dL   Calcium 9.2 8.9 - 10.3 mg/dL   Total Protein 7.2 6.5 - 8.1 g/dL   Albumin 3.8 3.5 - 5.0 g/dL  AST 14 (L) 15 - 41 U/L   ALT 8 0 - 44 U/L   Alkaline Phosphatase 70 38 - 126 U/L   Total Bilirubin 0.8 0.3 - 1.2 mg/dL   GFR calc non Af Amer 42 (L) >60 mL/min   GFR calc Af Amer 48 (L) >60 mL/min   Anion gap 7 5 - 15    Comment: Performed at [facility], [address] [place], [place] [address]    No current facility-administered medications for this encounter.   Current Outpatient Medications  Medication Sig Dispense Refill  . aspirin 81 MG tablet Take 81 mg by mouth daily.    Arna Medici 88 MCG tablet TAKE 1 TABLET BY MOUTH ONCE DAILY BEFORE BREAKFAST 90 tablet 0  . fluconazole (DIFLUCAN) 150 MG tablet Take 1 tablet (150 mg total) by mouth every 3 (three) days. 2 tablet 0  . furosemide (LASIX) 20 MG tablet Take 1 tablet (20 mg total) by mouth daily. 90 tablet 2  . latanoprost (XALATAN) 0.005 % ophthalmic solution INSTILL 1 DROP INTO EACH EYE ONCE DAILY    . losartan (COZAAR) 50 MG tablet Take 1 tablet (50 mg total) by mouth daily. 90 tablet 2  . Multiple Vitamin (MULTIVITAMIN) tablet Take 1 tablet by mouth daily.    . nebivolol (BYSTOLIC) 5 MG tablet Take 1 tablet (5 mg total) by mouth daily. 90 tablet 2  . phenazopyridine (PYRIDIUM) 200 MG tablet Take 1 tablet (200 mg total) by mouth 3 (three) times daily. 6 tablet 0  . polyethylene glycol powder (GLYCOLAX/MIRALAX) powder Take 17 g by mouth daily as needed for mild constipation. 500 g 0    Musculoskeletal: Strength & Muscle Tone: within normal limits Gait & Station: normal Patient leans: N/A  Psychiatric Specialty Exam: Physical Exam  Review of Systems  Psychiatric/Behavioral:  Negative for agitation, behavioral problems, dysphoric mood, hallucinations, sleep disturbance and suicidal ideas. The patient is nervous/anxious.     Blood pressure (!) 162/57, pulse 65, temperature 97.8 F (36.6 C), temperature source Oral, resp. rate 16, SpO2 99 %.There is no height or weight on file to calculate BMI.  General Appearance: Casual  Eye Contact:  Good  Speech:  Clear and Coherent  Volume:  Normal  Mood:  Euthymic  Affect:  Appropriate  Thought Process:  Coherent  Orientation:  Full (Time, Place, and Person)  Thought Content:  Logical and Hallucinations: Visual  Suicidal Thoughts:  No  Homicidal Thoughts:  No  Memory:  Recent;   Good  Judgement:  Good  Insight:  Good  Psychomotor Activity:  Restlessness  Concentration:  Concentration: Good  Recall:  Good  Fund of Knowledge:  Good  Language:  Good  Akathisia:  No  Handed:  Right  AIMS (if indicated):     Assets:  Communication Skills Desire for Improvement Housing Leisure Time Physical Health Resilience  ADL's:  Intact  Cognition:  WNL  Sleep:        Treatment Plan Summary: This is a 86 year old woman with new onset visual hallucinations.  Is difficult to say whether or not the medication adjustment of her thyroid medication could be responsible for this, although patient is reporting an increase in anxiety due to this medication.  In this patient without any significant psych history, these new onset visual hallucinations are unlikely to be psychiatric in origin.  Patient is evaluated for safety and at this time reports feeling safe returning home.  She is lucid and clear with her thoughts and  statements.  Despite experiencing these visual hallucinations she is not distressed by them and has good insight into them as well.  Psychiatry's recommendation is for neurologic work-up to rule out Lewy body dementia, Parkinson's dementia, optic nerve issues, migraine with aura, seizures.  Given the patient's stability at  this time, will defer to ER physician as to whether or not this can be done on outpatient basis.   Diagnosis : Visual hallucinations  Disposition: No evidence of imminent risk to self or others at present.   Patient does not meet criteria for psychiatric inpatient admission. Supportive therapy provided about ongoing stressors. Discussed crisis plan, support from social network, calling 911, coming to the Emergency Department, and calling Suicide Hotline.  Dixie Dials, MD 09/02/2019 4:17 PM

## 2019-09-02 NOTE — Telephone Encounter (Signed)
Pt was sent to ER (see mychart message)

## 2019-09-02 NOTE — Telephone Encounter (Signed)
Patient started a few ago, having hallucinations, feels it started with getting her nights and days mixed up and she could not sleep and she has extreme anxiety,  After changing thyroid medication. They start in the afternoons and into the night . Yesterday she was awake sitting in her recliner and saw her light in the ceiling explode and then was back and everything normal. A few nights ago something masked was crawling up her legs she went to brush it away and nothing was there. Patient has labs today I cautioned her she should not drive, she says she is fine during the morning and day  Time hours this just happens in the evening and at night.

## 2019-09-02 NOTE — Discharge Instructions (Addendum)
Please follow-up with neurology for further evaluation, you were seen by our psychiatrist who felt these hallucinations were not caused by a psychiatric disease.

## 2019-09-02 NOTE — Telephone Encounter (Signed)
Noted. With the hallucinations she should be seen in the ED for evaluation with psychiatry and to consider if she needs imaging of her brain. Hallucinations are not listed as a potential side effect of the thyroid medication.

## 2019-09-02 NOTE — ED Provider Notes (Signed)
[facility] Emergency Department Provider Note   ____________________________________________    I have reviewed the triage vital signs and the nursing notes.   HISTORY  Chief Complaint Hallucinations     HPI Whitney Simmons is a 86 y.o. female with history as noted below who presents with complaints of approximately 3 weeks of intermittent visual hallucinations.  Patient notes that occasionally she thinks that she sees a bug running across something and when she goes to wipe away it is not there.  She reports she is doing okay but that the hallucinations are "annoying ".  She denies headache.  No nausea or vomiting.  Has no physical complaints at this time.  Has never had hallucinations before.  No new medications.  Past Medical History:  Diagnosis Date  . Breast cancer ([place]) 2015   LT LUMPECTOMY  . Breast microcalcification, mammographic 01/31/2014  . 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 Declined postoperative radiation and antiestrogen therapy.  . Edema   . Hyperlipidemia   . Hypertension   . IBS (irritable bowel syndrome)   . Irregular heart beat   . Thyroid disease    s/p radioactive iodine ablation 30 years ago    Patient Active Problem List   Diagnosis Date Noted  . Vaginal discharge 06/30/2019  . Difficulty hearing 12/06/2018  . Bruising 05/31/2018  . Gait instability 09/22/2017  . Nevus 05/29/2017  . Callus of foot 01/05/2017  . Thrombocytopenia ([place]) 11/06/2015  . Osteoporosis 03/07/2015  . Postmenopausal estrogen deficiency 01/23/2015  . Glaucoma 05/12/2014  . History of breast cancer 02/13/2014  . IBS (irritable bowel syndrome) 12/09/2013  . Palpitations 09/01/2012  . Chronic kidney disease, stage III (moderate) 08/18/2012  . Anxiety 08/02/2012  . Hypothyroidism 07/30/2012  . Hyperlipidemia 07/30/2012  . Hypertension 07/30/2012    Past Surgical History:  Procedure Laterality  Date  . BREAST LUMPECTOMY Left 2015   DUCTAL CARCINOMA IN SITU WITH CALCIFICATIONS  . BREAST SURGERY Left 02-15-14   wide excision  . CATARACT EXTRACTION    . CHOLECYSTECTOMY    . TOE AMPUTATION     left 5th toe    Prior to Admission medications   Medication Sig Start Date End Date Taking? Authorizing Provider  aspirin 81 MG tablet Take 81 mg by mouth daily.    [provider]  EUTHYROX 88 MCG tablet TAKE 1 TABLET BY MOUTH ONCE DAILY BEFORE BREAKFAST 07/13/19   Leone Haven, MD  fluconazole (DIFLUCAN) 150 MG tablet Take 1 tablet (150 mg total) by mouth every 3 (three) days. 07/05/19   Leone Haven, MD  furosemide (LASIX) 20 MG tablet Take 1 tablet (20 mg total) by mouth daily. 12/06/18 09/02/19  Leone Haven, MD  latanoprost (XALATAN) 0.005 % ophthalmic solution INSTILL 1 DROP INTO EACH EYE ONCE DAILY 01/31/19   [provider]  losartan (COZAAR) 50 MG tablet Take 1 tablet (50 mg total) by mouth daily. 12/06/18   Leone Haven, MD  Multiple Vitamin (MULTIVITAMIN) tablet Take 1 tablet by mouth daily.    [provider]  nebivolol (BYSTOLIC) 5 MG tablet Take 1 tablet (5 mg total) by mouth daily. 12/06/18   Leone Haven, MD  phenazopyridine (PYRIDIUM) 200 MG tablet Take 1 tablet (200 mg total) by mouth 3 (three) times daily. 06/16/19   Enrique Sack, FNP  polyethylene glycol powder (GLYCOLAX/MIRALAX) powder Take 17 g by mouth daily as needed for mild  constipation. 05/31/18   Leone Haven, MD     Allergies Levaquin [levofloxacin in d5w] and Sulfa antibiotics  Family History  Problem Relation Age of Onset  . Heart disease Mother   . Heart disease Maternal Aunt   . Breast cancer Neg Hx     Social History Social History   Tobacco Use  . Smoking status: Never Smoker  . Smokeless tobacco: Never Used  Substance Use Topics  . Alcohol use: No  . Drug use: No    Review of Systems  Constitutional: No fever/chills Eyes: No visual  changes.  ENT: No sore throat. Cardiovascular: Denies chest pain. Respiratory: Denies shortness of breath. Gastrointestinal: No abdominal pain.   Genitourinary: Negative for dysuria. Musculoskeletal: Negative for back pain. Skin: Negative for rash. Neurological: Negative for headaches    ____________________________________________   PHYSICAL EXAM:  VITAL SIGNS: ED Triage Vitals  Enc Vitals Group     BP 09/02/19 1053 (!) 154/60     Pulse Rate 09/02/19 1053 (!) 54     Resp 09/02/19 1053 19     Temp 09/02/19 1053 97.8 F (36.6 C)     Temp Source 09/02/19 1053 Oral     SpO2 09/02/19 1053 98 %     Weight --      Height --      Head Circumference --      Peak Flow --      Pain Score 09/02/19 1048 0     Pain Loc --      Pain Edu? --      Excl. in [place]? --     Constitutional: Alert and oriented  Nose: No congestion/rhinnorhea.  Cardiovascular: Normal rate, regular rhythm. Grossly normal heart sounds.  Good peripheral circulation. Respiratory: Normal respiratory effort.  No retractions. Lungs CTAB. Gastrointestinal: Soft and nontender. No distention.  No CVA tenderness.  Musculoskeletal: No lower extremity tenderness nor edema.  Warm and well perfused Neurologic:  Normal speech and language. No gross focal neurologic deficits are appreciated.  Skin:  Skin is warm, dry and intact. No rash noted. Psychiatric: Mood and affect are normal. Speech and behavior are normal.  ____________________________________________   LABS (all labs ordered are listed, but only abnormal results are displayed)  Labs Reviewed  COMPREHENSIVE METABOLIC PANEL - Abnormal; Notable for the following components:      Result Value   Glucose, Bld 102 (*)    Creatinine, Ser 1.16 (*)    AST 14 (*)    GFR calc non Af Amer 42 (*)    GFR calc Af Amer 48 (*)    All other components within normal limits  CBC    ____________________________________________  EKG  None ____________________________________________  RADIOLOGY  None ____________________________________________   PROCEDURES  Procedure(s) performed: No  Procedures   Critical Care performed: No ____________________________________________   INITIAL IMPRESSION / ASSESSMENT AND PLAN / ED COURSE  Pertinent labs & imaging results that were available during my care of the patient were reviewed by me and considered in my medical decision making (see chart for details).  Patient well-appearing in no acute distress.  Intermittent visual hallucinations which she states happens perhaps once a day.  Will ask psychiatry to consult.  Very much appreciate psychiatry consult, they feel hallucinations unlikely to be related to psychiatric disease, recommend neurology follow-up.  Patient is deemed safe for discharge, she will follow-up with neurology.    ____________________________________________   FINAL CLINICAL IMPRESSION(S) / ED DIAGNOSES  Final diagnoses:  Visual hallucination  Note:  This document was prepared using Dragon voice recognition software and may include unintentional dictation errors.   Lavonia Drafts, MD 09/02/19 1324

## 2019-09-02 NOTE — ED Triage Notes (Signed)
BIBA from [facility]. Patient presented for routine lab work, but patient told staff she was experiencing hallucinations of light bulbs shattering, bugs crawling x several weeks. No prior psychiatric history noted. Patient is awake, alert, in NAD.

## 2019-09-02 NOTE — Telephone Encounter (Signed)
Patient was seen in office and sent to ER.

## 2019-09-05 ENCOUNTER — Ambulatory Visit (INDEPENDENT_AMBULATORY_CARE_PROVIDER_SITE_OTHER): Payer: PPO

## 2019-09-05 ENCOUNTER — Ambulatory Visit (INDEPENDENT_AMBULATORY_CARE_PROVIDER_SITE_OTHER): Payer: PPO | Admitting: Obstetrics and Gynecology

## 2019-09-05 ENCOUNTER — Other Ambulatory Visit: Payer: Self-pay

## 2019-09-05 ENCOUNTER — Encounter: Payer: Self-pay | Admitting: Obstetrics and Gynecology

## 2019-09-05 VITALS — BP 120/70 | Ht 67.0 in | Wt 174.0 lb

## 2019-09-05 DIAGNOSIS — N950 Postmenopausal bleeding: Secondary | ICD-10-CM

## 2019-09-05 DIAGNOSIS — R9389 Abnormal findings on diagnostic imaging of other specified body structures: Secondary | ICD-10-CM

## 2019-09-05 DIAGNOSIS — N859 Noninflammatory disorder of uterus, unspecified: Secondary | ICD-10-CM

## 2019-09-05 DIAGNOSIS — N852 Hypertrophy of uterus: Secondary | ICD-10-CM

## 2019-09-05 NOTE — Progress Notes (Signed)
Patient ID: Whitney Simmons, female   DOB: 1927-07-24, 86 y.o.   MRN: [id number]  Reason for Consult: Follow-up (U/S follow up, )   Referred by Whitney Haven, MD  Subjective:     HPI:  Whitney Simmons is a 86 y.o. female she is following up today for pelvic ultrasound.  No new complaints.  Past Medical History:  Diagnosis Date  . Breast cancer ([place]) 2015   LT LUMPECTOMY  . Breast microcalcification, mammographic 01/31/2014  . 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 Declined postoperative radiation and antiestrogen therapy.  . Edema   . Hyperlipidemia   . Hypertension   . IBS (irritable bowel syndrome)   . Irregular heart beat   . Thyroid disease    s/p radioactive iodine ablation 30 years ago   Family History  Problem Relation Age of Onset  . Heart disease Mother   . Heart disease Maternal Aunt   . Breast cancer Neg Hx    Past Surgical History:  Procedure Laterality Date  . BREAST LUMPECTOMY Left 2015   DUCTAL CARCINOMA IN SITU WITH CALCIFICATIONS  . BREAST SURGERY Left 02-15-14   wide excision  . CATARACT EXTRACTION    . CHOLECYSTECTOMY    . TOE AMPUTATION     left 5th toe    Short Social History:  Social History   Tobacco Use  . Smoking status: Never Smoker  . Smokeless tobacco: Never Used  Substance Use Topics  . Alcohol use: No    Allergies  Allergen Reactions  . Levaquin [Levofloxacin In D5w] Other (See Comments)    hallucinations  . Sulfa Antibiotics     Hallucinations     Current Outpatient Medications  Medication Sig Dispense Refill  . aspirin 81 MG tablet Take 81 mg by mouth daily.    Arna Medici 88 MCG tablet TAKE 1 TABLET BY MOUTH ONCE DAILY BEFORE BREAKFAST 90 tablet 0  . fluconazole (DIFLUCAN) 150 MG tablet Take 1 tablet (150 mg total) by mouth every 3 (three) days. 2 tablet 0  . latanoprost (XALATAN) 0.005 % ophthalmic solution INSTILL 1 DROP INTO EACH EYE ONCE DAILY    . losartan (COZAAR)  50 MG tablet Take 1 tablet (50 mg total) by mouth daily. 90 tablet 2  . Multiple Vitamin (MULTIVITAMIN) tablet Take 1 tablet by mouth daily.    . nebivolol (BYSTOLIC) 5 MG tablet Take 1 tablet (5 mg total) by mouth daily. 90 tablet 2  . phenazopyridine (PYRIDIUM) 200 MG tablet Take 1 tablet (200 mg total) by mouth 3 (three) times daily. 6 tablet 0  . polyethylene glycol powder (GLYCOLAX/MIRALAX) powder Take 17 g by mouth daily as needed for mild constipation. 500 g 0  . furosemide (LASIX) 20 MG tablet Take 1 tablet (20 mg total) by mouth daily. 90 tablet 2   No current facility-administered medications for this visit.    Review of Systems  Constitutional: Negative for chills, fatigue, fever and unexpected weight change.  HENT: Negative for trouble swallowing.  Eyes: Negative for loss of vision.  Respiratory: Negative for cough, shortness of breath and wheezing.  Cardiovascular: Negative for chest pain, leg swelling, palpitations and syncope.  GI: Negative for abdominal pain, blood in stool, diarrhea, nausea and vomiting.  GU: Negative for difficulty urinating, dysuria, frequency and hematuria.  Musculoskeletal: Negative for back pain, leg pain and joint pain.  Skin: Negative for rash.  Neurological: Negative for dizziness, headaches, light-headedness, numbness  and seizures.  Psychiatric: Negative for behavioral problem, confusion, depressed mood and sleep disturbance.        Objective:  Objective   Vitals:   09/05/19 1442  BP: 120/70  Weight: 174 lb (78.9 kg)  Height: 5\' 7"  (1.702 m)   Body mass index is 27.25 kg/m.  Physical Exam Vitals and nursing note reviewed.  Constitutional:      Appearance: She is well-developed.  HENT:     Head: Normocephalic and atraumatic.  Eyes:     Pupils: Pupils are equal, round, and reactive to light.  Cardiovascular:     Rate and Rhythm: Normal rate and regular rhythm.  Pulmonary:     Effort: Pulmonary effort is normal. No respiratory  distress.  Skin:    General: Skin is warm and dry.  Neurological:     Mental Status: She is alert and oriented to person, place, and time.  Psychiatric:        Behavior: Behavior normal.        Thought Content: Thought content normal.        Judgment: Judgment normal.          Assessment/Plan:     86 year old with postmenopausal bleeding following up for pelvic ultrasound Pelvic ultrasound showed thickened endometrium at 5 mm as well as a heterogeneous uterine mass.  Discussed with patient and daughter who was on the phone options for endometrial biopsy versus hysteroscopy D&C.  Discussed the biopsy would let us know if bleeding was related to atrophic endometrium or hyperplasia or malignancy.  She reports that she is interested in knowing if there is any hyperplasia and malignancy although she is undecided if she would take any action and treatment plans for these diseases.  Patient currently lives alone.  She also drives herself.  Because patient was uncomfortable during her last examination she declines endometrial biopsy today.  Her and her daughter will consider hysteroscopy D&C.  She reports that she will pray and speak to her daughter about it further at home.  We will have the office staff call her next week regarding possible scheduling of hysteroscopy D&C.  Would need medical clearance prior to this procedure.  More than 25 minutes were spent face to face with the patient in the room with more than 50% of the time spent providing counseling and discussing the plan of management.     Adrian Prows MD [facility] 09/05/2019 3:17 PM

## 2019-09-07 ENCOUNTER — Telehealth: Payer: Self-pay | Admitting: Obstetrics and Gynecology

## 2019-09-07 NOTE — Telephone Encounter (Signed)
Patient is calling for labs results. Please advise. 

## 2019-09-13 NOTE — Telephone Encounter (Signed)
Spoke to pt and discussed surgery dates. She indicated that she could have nothing done prior to the 2nd week in April because she lives alone and will not have anyuone to help her until then.  DOS 4/20 w Dr Gilman Schmidt  H&P - she requested it to be after 4/12 - we scheduled it for 4/14 at 1:50.  I adv of Covid testing done prior to surgeries. She questioned as to why there were so many appts being scheduled. I adv the hosp req the Covid testing for surgery and that her date to test is 4/16 at 8-10:30, [address], drive up and wear a mask. Adv pt to quar after tested until day of surgery.  Adv pt she will also have a preadmit phone call and the date/time will be on her MyChart. Also she may receive phone calls from the pharmacy and pt care.

## 2019-09-13 NOTE — Telephone Encounter (Signed)
-----   Message from Homero Fellers, MD sent at 09/05/2019  3:23 PM EST ----- Surgery Booking Request Patient Full Name:  Whitney Simmons  MRN: [id number]  DOB: 1928-03-24  Surgeon: Homero Fellers, MD  Requested Surgery Date and Time: Anytime  Primary Diagnosis AND Code: postmenopausal bleeding- thickened endometrium Secondary Diagnosis and Code:  Surgical Procedure: Hysteroscopy D&C L&D Notification: No Admission Status: same day surgery Length of Surgery: 1 hour Special Case Needs: No H&P: Yes Phone Interview???:  No Interpreter: No Language:  Medical Clearance:  Yes Special Scheduling Instructions: Patient is considering whether or not she would like to have a hysteroscopy D&C.  She is going to pray about the situation and discuss with her daughter.  She may plan to have the procedure in 3 to 6 weeks whenever her daughter can be present.  Patient is okay with you speaking with her daughter as well her contact information is in patient information demographics.   Any known health/anesthesia issues, diabetes, sleep apnea, latex allergy, defibrillator/pacemaker?: Yes Acuity: P3   (P1 highest, P2 delay may cause harm, P3 low, elective gyn, P4 lowest)

## 2019-09-14 NOTE — Telephone Encounter (Signed)
It's not there yet because I needed to find out what health issues and what medical clearance was needed per surgery request.

## 2019-09-15 ENCOUNTER — Telehealth: Payer: Self-pay

## 2019-09-15 NOTE — Telephone Encounter (Signed)
I called and scheduled a medical clearaance exam with the patient for 10/07/2019 @ 11:30 am .  Lindia Garms,cma

## 2019-09-15 NOTE — Telephone Encounter (Signed)
Pre-admit requests that Dr Gilman Schmidt place orders prior to 4/8. Due to pt age they need her to come in at least 2 days before Covid testing.

## 2019-09-15 NOTE — Telephone Encounter (Signed)
She will have to go in one of the 11:30 slots.

## 2019-09-19 DIAGNOSIS — H401121 Primary open-angle glaucoma, left eye, mild stage: Secondary | ICD-10-CM | POA: Diagnosis not present

## 2019-09-26 ENCOUNTER — Telehealth: Payer: Self-pay | Admitting: Family Medicine

## 2019-09-26 DIAGNOSIS — H903 Sensorineural hearing loss, bilateral: Secondary | ICD-10-CM | POA: Diagnosis not present

## 2019-09-26 DIAGNOSIS — H6123 Impacted cerumen, bilateral: Secondary | ICD-10-CM | POA: Diagnosis not present

## 2019-09-26 MED ORDER — FUROSEMIDE 20 MG PO TABS: 20.0000 mg | ORAL_TABLET | Freq: Every day | ORAL | 2 refills | Status: DC

## 2019-09-26 MED ORDER — LOSARTAN POTASSIUM 50 MG PO TABS: 50.0000 mg | ORAL_TABLET | Freq: Every day | ORAL | 2 refills | Status: DC

## 2019-09-26 NOTE — Telephone Encounter (Signed)
Pt needs refill on losartan and furosemide (LASIX) 20 MG tablet. Pt is out of medication.

## 2019-10-05 ENCOUNTER — Ambulatory Visit (INDEPENDENT_AMBULATORY_CARE_PROVIDER_SITE_OTHER): Payer: PPO | Admitting: Family Medicine

## 2019-10-05 ENCOUNTER — Other Ambulatory Visit: Payer: Self-pay

## 2019-10-05 ENCOUNTER — Encounter: Payer: Self-pay | Admitting: Family Medicine

## 2019-10-05 VITALS — BP 120/70 | HR 74 | Temp 96.6°F | Ht 67.0 in | Wt 174.8 lb

## 2019-10-05 DIAGNOSIS — Z01818 Encounter for other preprocedural examination: Secondary | ICD-10-CM | POA: Insufficient documentation

## 2019-10-05 DIAGNOSIS — E039 Hypothyroidism, unspecified: Secondary | ICD-10-CM

## 2019-10-05 DIAGNOSIS — R441 Visual hallucinations: Secondary | ICD-10-CM | POA: Diagnosis not present

## 2019-10-05 LAB — TSH: TSH: 1.02 u[IU]/mL (ref 0.35–4.50)

## 2019-10-05 MED ORDER — LEVOTHYROXINE SODIUM 88 MCG PO TABS: ORAL_TABLET | ORAL | 3 refills | Status: DC

## 2019-10-05 NOTE — Assessment & Plan Note (Addendum)
She has had no recurrence of these.  Stress certainly could have played a role.  We will check with the patient to see if she would be willing to see neurology to consider further evaluation for this.

## 2019-10-05 NOTE — Patient Instructions (Signed)
Nice to see you. We will check a TSH today.

## 2019-10-05 NOTE — Assessment & Plan Note (Signed)
Patient is low risk for cardiac issues related to her procedure.  Her medical issues are well controlled at this time.  We will complete her paperwork.

## 2019-10-05 NOTE — Assessment & Plan Note (Signed)
Check TSH.  Refill levothyroxine.

## 2019-10-05 NOTE — Progress Notes (Signed)
Tommi Rumps, MD Phone: [phone]  Whitney Simmons is a 86 y.o. female who presents today for preoperative clearance  Pt is a 86 y.o. female who is here for preoperative clearance for hysteroscopy and D&C.  1) High Risk Cardiac Conditions  1) Recent MI - No.  2) Decompensated Heart Failure - No.  3) unstable angina - No.  4) Symptomatic arrythmia - No.  5) Sx Valvular Disease - No.  2) Intermediate Risk Factors - DM, CKD, CVA, CHF, CAD - No.  2) Functional Status - > 4 mets ( Walk, run, climb stairs ) Yes.    3) Surgery Specific Risk -       Low (Endoscopic, Cataract, Breast )  4) Further Noninvasive evaluation -   1) EKG - No.   1) Hx of CVA, CAD, DM, CKD  2) Echo - No.   1) Worsening dyspnea   3) Stress Testing - Active Cardiac Disease - No.  5) Need for medical therapy - Beta Blocker, Statins indicated ? No.   Hypothyroidism: Due for TSH.  She needs a refill on Euthyrox.  Hallucinations: She has not had any additional issues with this.  She believes it was related to stress.  No depression.  No memory difficulty.  She saw psychiatry in the hospital and they did not believe it was a psychiatric issue.  They recommended work-up for underlying neurological cause.  Social History   Tobacco Use  Smoking Status Never Smoker  Smokeless Tobacco Never Used     ROS see history of present illness  Objective  Physical Exam Vitals:   10/05/19 1140  BP: 120/70  Pulse: 74  Temp: (!) 96.6 F (35.9 C)  SpO2: 96%    BP Readings from Last 3 Encounters:  10/05/19 120/70  09/05/19 120/70  09/02/19 (!) 162/57   Wt Readings from Last 3 Encounters:  10/05/19 174 lb 12.8 oz (79.3 kg)  09/05/19 174 lb (78.9 kg)  08/17/19 174 lb (78.9 kg)    Physical Exam Constitutional:      General: She is not in acute distress.    Appearance: She is not diaphoretic.  Cardiovascular:     Rate and Rhythm: Normal rate and regular rhythm.     Heart sounds: Normal heart sounds.    Pulmonary:     Effort: Pulmonary effort is normal.     Breath sounds: Normal breath sounds.  Musculoskeletal:     Right lower leg: No edema.     Left lower leg: No edema.  Skin:    General: Skin is warm and dry.  Neurological:     Mental Status: She is alert.      Assessment/Plan: Please see individual problem list.  Preoperative exam for gynecologic surgery Patient is low risk for cardiac issues related to her procedure.  Her medical issues are well controlled at this time.  We will complete her paperwork.  Hallucination, visual She has had no recurrence of these.  Stress certainly could have played a role.  We will check with the patient to see if she would be willing to see neurology to consider further evaluation for this.  Hypothyroidism Check TSH.  Refill levothyroxine.   Orders Placed This Encounter  Procedures  . TSH    Meds ordered this encounter  Medications  . levothyroxine (EUTHYROX) 88 MCG tablet    Sig: TAKE 1 TABLET BY MOUTH ONCE DAILY BEFORE BREAKFAST    Dispense:  90 tablet    Refill:  3  This visit occurred during the SARS-CoV-2 public health emergency.  Safety protocols were in place, including screening questions prior to the visit, additional usage of staff PPE, and extensive cleaning of exam room while observing appropriate contact time as indicated for disinfecting solutions.    Rasheen Bells, MD [facility] [place]  

## 2019-10-06 ENCOUNTER — Other Ambulatory Visit: Payer: Self-pay

## 2019-10-06 ENCOUNTER — Other Ambulatory Visit: Payer: PPO

## 2019-10-06 ENCOUNTER — Telehealth: Payer: Self-pay | Admitting: Obstetrics and Gynecology

## 2019-10-06 MED ORDER — NEBIVOLOL HCL 5 MG PO TABS: 5.0000 mg | ORAL_TABLET | Freq: Every day | ORAL | 2 refills | Status: DC

## 2019-10-06 NOTE — Telephone Encounter (Signed)
Orders placed.

## 2019-10-06 NOTE — Telephone Encounter (Signed)
DOS 4/20 H&P 4/14 @ 1:50  Medical clearance has been received from Dr Caryl Bis on 4/7. I have faxed it to [facility] in [place].  Dr Gilman Schmidt, please place orders for procedure today. Because of pt age, they require additional time for case prep per Pre-admit. Thank you!

## 2019-10-06 NOTE — Progress Notes (Signed)
I called and spoke to the patient and she stated she did not want to go to neurology because the hallucinations has stopped she seems to think it was all stress related.  Ashika Apuzzo,cma

## 2019-10-07 ENCOUNTER — Inpatient Hospital Stay: Payer: PPO | Admitting: Family Medicine

## 2019-10-10 ENCOUNTER — Encounter
Admission: RE | Admit: 2019-10-10 | Discharge: 2019-10-10 | Disposition: A | Discharge status: Home / Self Care | Payer: PPO | Source: Ambulatory Visit | Attending: Obstetrics and Gynecology | Admitting: Obstetrics and Gynecology

## 2019-10-10 ENCOUNTER — Other Ambulatory Visit: Payer: Self-pay

## 2019-10-10 HISTORY — DX: Hypothyroidism, unspecified: E03.9

## 2019-10-10 NOTE — Patient Instructions (Addendum)
Your pre-op EKG visit is scheduled: Thursday 10/13/2019 @ 1:00 pm.  [place] entrance and you will be directed to Pre-Admit Testing. Your pre-op COVID test is scheduled:  Friday 10/14/2019.  DRIVE UP in front of [place] any time 8 am to 1 pm.  Your surgery is scheduled: Tuesday 10/18/2019 Report to Same Day Surgery [facility] Entrance-take elevator to 2nd floor and check in with Surgery Information desk.) To find out your arrival time, call [phone] 1:00-3:00 PM on Monday 10/17/2019  Remember: Instructions that are not followed completely may result in serious medical risk, up to and including death, or upon the discretion of your surgeon and anesthesiologist your surgery may need to be rescheduled.   __x__ 1. Do not eat food (including mints, candies, chewing gum) after midnight the night before your procedure. You may drink clear liquids up to 2 hours before you are scheduled to arrive at the hospital for your procedure.  Do not drink anything within 2 hours of your scheduled arrival to the hospital.  Approved clear liquids:  --Water or Apple juice without pulp  --Clear carbohydrate beverage such as Gatorade or Powerade  --Black Coffee or Clear Tea (No milk, no creamers, do not add anything to the coffee or tea)  __x__ 2. Finish your provided Ensure pre-surgery drink 2 hours before your scheduled arrival time on the day of surgery.  __x__ 3. No Alcohol or Smoking for 24 hours before or after surgery.  __x__ 4. Notify your doctor if there is any change in your medical condition (cold, fever, infections).  __x__ 5. On the morning of surgery brush your teeth with toothpaste and water.  You may rinse your mouth with mouthwash if you wish.  Do not swallow any toothpaste or mouthwash.   __x__ 6. Use antibacterial soap such as Dial, if available, to shower/bathe before arriving for surgery.   Do not wear jewelry, make-up, hairpins, clips or nail polish on the day of  surgery.  Do not wear lotions, powders, deodorant, or perfumes.   Do not shave below the face/neck 48 hours prior to surgery.   Do not bring valuables to the hospital.  [facility] is not responsible for any belongings or valuables.               Eyeglasses and hearing aids may not be worn into surgery.  Bring a protective case for your hearing aids and glasses on the day of surgery.  For patients discharged on the day of surgery, you will NOT be permitted to drive yourself home.  You must have a responsible adult with you for 24 hours after surgery.  __x__ Take these medicines on the morning of surgery with a SMALL SIP OF WATER:  1. Levothyroxine (Euthyrox)  2. Nebivolol (Bystolic)  __x__ Skip only on the morning of surgery:  Furosemide (Lasix) and Losartan (Cozaar).  You do NOT need to stop taking these ahead of time.  _n/a_ Follow recommendations from Cardiologist, Pulmonologist or PCP regarding stopping blood thinners.  __x__ STARTING TODAY (1 WEEK BEFORE SURGERY): Do not take any anti-inflammatories such as aspirin, Advil, Ibuprofen, Motrin, Aleve, Naproxen, Naprosyn, BC/Goodies powders or aspirin products. You may take Tylenol if needed.   __x__ STARTING TODAY UNTIL AFTER SURGERY: Do not take any over the counter vitamins, herbal or nutritional supplements. You may continue taking your multivitamin.  __x__ If possible, please bring copy of medical advance directive ([place], Living Will) on the day  of surgery.

## 2019-10-12 ENCOUNTER — Other Ambulatory Visit: Payer: Self-pay

## 2019-10-12 ENCOUNTER — Encounter: Payer: Self-pay | Admitting: Obstetrics and Gynecology

## 2019-10-12 ENCOUNTER — Ambulatory Visit (INDEPENDENT_AMBULATORY_CARE_PROVIDER_SITE_OTHER): Payer: PPO | Admitting: Obstetrics and Gynecology

## 2019-10-12 VITALS — BP 142/70 | Ht 67.0 in | Wt 175.0 lb

## 2019-10-12 DIAGNOSIS — N950 Postmenopausal bleeding: Secondary | ICD-10-CM

## 2019-10-12 DIAGNOSIS — R9389 Abnormal findings on diagnostic imaging of other specified body structures: Secondary | ICD-10-CM | POA: Diagnosis not present

## 2019-10-12 NOTE — Progress Notes (Signed)
Patient ID: Whitney Simmons, female   DOB: 02-03-1928, 86 y.o.   MRN: [id number]  Reason for Consult: Pre-op Exam   Referred by Leone Haven, MD  Subjective:     HPI:  Whitney Simmons is a 86 y.o. female she is here today for a preoperative visit.  She has been having postmenopausal bleeding.  On ultrasound she had a thickened endometrium as well as a 2 cm mass seen inside the endometrial cavity.  We have discussed options for endometrial biopsy in office versus in the OR.  Patient is interested in a hysteroscopy D&C for diagnostic evaluation of the thickened endometrium postmenopausal bleeding and endometrial mass.  Patient's daughter is present in office today.  She has flown from [place] to help care for her mother while she undergoes this procedure.  At her last visit I spoke with her daughter over the phone.  They understand that this could be related to a malignancy. Given her advanced age, she is a surgical risk, but it is is their preference to have the procedure performed.   Neda reports that her bleeding has improved although it is still present.  She has a small amount of bleeding sometimes dark brown in color.  In the past her period was as heavy as a regular menstrual cycle.  Past Medical History:  Diagnosis Date  . Breast cancer ([place]) 2015   LT LUMPECTOMY  . Breast microcalcification, mammographic 01/31/2014  . 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 Declined postoperative radiation and antiestrogen therapy.  . Edema   . Hyperlipidemia   . Hypertension   . Hypothyroidism   . IBS (irritable bowel syndrome)   . Irregular heart beat   . Thyroid disease    s/p radioactive iodine ablation 30 years ago   Family History  Problem Relation Age of Onset  . Heart disease Mother   . Heart disease Maternal Aunt   . Breast cancer Neg Hx    Past Surgical History:  Procedure Laterality Date  . BREAST LUMPECTOMY Left 2015   DUCTAL  CARCINOMA IN SITU WITH CALCIFICATIONS  . BREAST SURGERY Left 02-15-14   wide excision  . CATARACT EXTRACTION    . CHOLECYSTECTOMY    . TOE AMPUTATION     left 5th toe    Short Social History:  Social History   Tobacco Use  . Smoking status: Never Smoker  . Smokeless tobacco: Never Used  Substance Use Topics  . Alcohol use: No    Allergies  Allergen Reactions  . Levaquin [Levofloxacin In D5w] Other (See Comments)    hallucinations  . Sulfa Antibiotics     Hallucinations     Current Outpatient Medications  Medication Sig Dispense Refill  . furosemide (LASIX) 20 MG tablet Take 1 tablet (20 mg total) by mouth daily. 90 tablet 2  . latanoprost (XALATAN) 0.005 % ophthalmic solution Place 1 drop into both eyes at bedtime.     Marland Kitchen levothyroxine (EUTHYROX) 88 MCG tablet TAKE 1 TABLET BY MOUTH ONCE DAILY BEFORE BREAKFAST 90 tablet 3  . losartan (COZAAR) 50 MG tablet Take 1 tablet (50 mg total) by mouth daily. 90 tablet 2  . Multiple Vitamin (MULTIVITAMIN) tablet Take 1 tablet by mouth daily.    . nebivolol (BYSTOLIC) 5 MG tablet Take 1 tablet (5 mg total) by mouth daily. 90 tablet 2   No current facility-administered medications for this visit.    Review of Systems  Constitutional: Negative for chills, fatigue, fever and unexpected weight change.  HENT: Negative for trouble swallowing.  Eyes: Negative for loss of vision.  Respiratory: Negative for cough, shortness of breath and wheezing.  Cardiovascular: Negative for chest pain, leg swelling, palpitations and syncope.  GI: Negative for abdominal pain, blood in stool, diarrhea, nausea and vomiting.  GU: Negative for difficulty urinating, dysuria, frequency and hematuria.  Musculoskeletal: Negative for back pain, leg pain and joint pain.  Skin: Negative for rash.  Neurological: Negative for dizziness, headaches, light-headedness, numbness and seizures.  Psychiatric: Negative for behavioral problem, confusion, depressed mood and  sleep disturbance.        Objective:  Objective   Vitals:   10/12/19 1355  BP: (!) 142/70  Weight: 175 lb (79.4 kg)  Height: 5\' 7"  (1.702 m)   Body mass index is 27.41 kg/m.  Physical Exam Vitals and nursing note reviewed.  Constitutional:      Appearance: She is well-developed.  HENT:     Head: Normocephalic and atraumatic.  Eyes:     Pupils: Pupils are equal, round, and reactive to light.  Cardiovascular:     Rate and Rhythm: Normal rate and regular rhythm.  Pulmonary:     Effort: Pulmonary effort is normal. No respiratory distress.  Skin:    General: Skin is warm and dry.  Neurological:     Mental Status: She is alert and oriented to person, place, and time.  Psychiatric:        Behavior: Behavior normal.        Thought Content: Thought content normal.        Judgment: Judgment normal.          Assessment/Plan:     86 year old with thickened endometrium and postpartum menopausal bleeding.  Have discussed risk benefits and alternatives to hysteroscopy D&C in detail.  Risks include risk of infection risk of bleeding and risk of perforation and risk of emergent procedures as well as possible damage to surrounding pelvic organs.  Patient desires to proceed with the procedure.  Have discussed preoperative procedures in detail with the patient.  She understands she has appointments with preop as well as Covid testing.  More than 30 minutes were spent face to face with the patient in the room, reviewing the medical record, labs and images, and coordinating care for the patient. The plan of management was discussed in detail and counseling was provided.    Adrian Prows MD [facility], [facility] 10/12/2019 5:00 PM

## 2019-10-12 NOTE — H&P (View-Only) (Signed)
Patient ID: Whitney Simmons, female   DOB: Nov 10, 1927, 86 y.o.   MRN: [id number]  Reason for Consult: Pre-op Exam   Referred by Leone Haven, MD  Subjective:     HPI:  Whitney Simmons is a 86 y.o. female she is here today for a preoperative visit.  She has been having postmenopausal bleeding.  On ultrasound she had a thickened endometrium as well as a 2 cm mass seen inside the endometrial cavity.  We have discussed options for endometrial biopsy in office versus in the OR.  Patient is interested in a hysteroscopy D&C for diagnostic evaluation of the thickened endometrium postmenopausal bleeding and endometrial mass.  Patient's daughter is present in office today.  She has flown from [place] to help care for her mother while she undergoes this procedure.  At her last visit I spoke with her daughter over the phone.  They understand that this could be related to a malignancy. Given her advanced age, she is a surgical risk, but it is is their preference to have the procedure performed.   [name] reports that her bleeding has improved although it is still present.  She has a small amount of bleeding sometimes dark brown in color.  In the past her period was as heavy as a regular menstrual cycle.  Past Medical History:  Diagnosis Date  . Breast cancer ([place]) 2015   LT LUMPECTOMY  . Breast microcalcification, mammographic 01/31/2014  . 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 Declined postoperative radiation and antiestrogen therapy.  . Edema   . Hyperlipidemia   . Hypertension   . Hypothyroidism   . IBS (irritable bowel syndrome)   . Irregular heart beat   . Thyroid disease    s/p radioactive iodine ablation 30 years ago   Family History  Problem Relation Age of Onset  . Heart disease Mother   . Heart disease Maternal Aunt   . Breast cancer Neg Hx    Past Surgical History:  Procedure Laterality Date  . BREAST LUMPECTOMY Left 2015   DUCTAL  CARCINOMA IN SITU WITH CALCIFICATIONS  . BREAST SURGERY Left 02-15-14   wide excision  . CATARACT EXTRACTION    . CHOLECYSTECTOMY    . TOE AMPUTATION     left 5th toe    Short Social History:  Social History   Tobacco Use  . Smoking status: Never Smoker  . Smokeless tobacco: Never Used  Substance Use Topics  . Alcohol use: No    Allergies  Allergen Reactions  . Levaquin [Levofloxacin In D5w] Other (See Comments)    hallucinations  . Sulfa Antibiotics     Hallucinations     Current Outpatient Medications  Medication Sig Dispense Refill  . furosemide (LASIX) 20 MG tablet Take 1 tablet (20 mg total) by mouth daily. 90 tablet 2  . latanoprost (XALATAN) 0.005 % ophthalmic solution Place 1 drop into both eyes at bedtime.     Marland Kitchen levothyroxine (EUTHYROX) 88 MCG tablet TAKE 1 TABLET BY MOUTH ONCE DAILY BEFORE BREAKFAST 90 tablet 3  . losartan (COZAAR) 50 MG tablet Take 1 tablet (50 mg total) by mouth daily. 90 tablet 2  . Multiple Vitamin (MULTIVITAMIN) tablet Take 1 tablet by mouth daily.    . nebivolol (BYSTOLIC) 5 MG tablet Take 1 tablet (5 mg total) by mouth daily. 90 tablet 2   No current facility-administered medications for this visit.    Review of Systems  Constitutional: Negative for chills, fatigue, fever and unexpected weight change.  HENT: Negative for trouble swallowing.  Eyes: Negative for loss of vision.  Respiratory: Negative for cough, shortness of breath and wheezing.  Cardiovascular: Negative for chest pain, leg swelling, palpitations and syncope.  GI: Negative for abdominal pain, blood in stool, diarrhea, nausea and vomiting.  GU: Negative for difficulty urinating, dysuria, frequency and hematuria.  Musculoskeletal: Negative for back pain, leg pain and joint pain.  Skin: Negative for rash.  Neurological: Negative for dizziness, headaches, light-headedness, numbness and seizures.  Psychiatric: Negative for behavioral problem, confusion, depressed mood and  sleep disturbance.        Objective:  Objective   Vitals:   10/12/19 1355  BP: (!) 142/70  Weight: 175 lb (79.4 kg)  Height: 5\' 7"  (1.702 m)   Body mass index is 27.41 kg/m.  Physical Exam Vitals and nursing note reviewed.  Constitutional:      Appearance: She is well-developed.  HENT:     Head: Normocephalic and atraumatic.  Eyes:     Pupils: Pupils are equal, round, and reactive to light.  Cardiovascular:     Rate and Rhythm: Normal rate and regular rhythm.  Pulmonary:     Effort: Pulmonary effort is normal. No respiratory distress.  Skin:    General: Skin is warm and dry.  Neurological:     Mental Status: She is alert and oriented to person, place, and time.  Psychiatric:        Behavior: Behavior normal.        Thought Content: Thought content normal.        Judgment: Judgment normal.          Assessment/Plan:     86 year old with thickened endometrium and postpartum menopausal bleeding.  Have discussed risk benefits and alternatives to hysteroscopy D&C in detail.  Risks include risk of infection risk of bleeding and risk of perforation and risk of emergent procedures as well as possible damage to surrounding pelvic organs.  Patient desires to proceed with the procedure.  Have discussed preoperative procedures in detail with the patient.  She understands she has appointments with preop as well as Covid testing.  More than 30 minutes were spent face to face with the patient in the room, reviewing the medical record, labs and images, and coordinating care for the patient. The plan of management was discussed in detail and counseling was provided.    Adrian Prows MD [facility], [facility] 10/12/2019 5:00 PM

## 2019-10-13 ENCOUNTER — Other Ambulatory Visit
Admission: RE | Admit: 2019-10-13 | Discharge: 2019-10-13 | Disposition: A | Discharge status: Home / Self Care | Payer: PPO | Source: Ambulatory Visit | Attending: Obstetrics and Gynecology | Admitting: Obstetrics and Gynecology

## 2019-10-13 DIAGNOSIS — Z01818 Encounter for other preprocedural examination: Secondary | ICD-10-CM | POA: Diagnosis not present

## 2019-10-13 DIAGNOSIS — Z20822 Contact with and (suspected) exposure to covid-19: Secondary | ICD-10-CM | POA: Diagnosis not present

## 2019-10-13 DIAGNOSIS — I10 Essential (primary) hypertension: Secondary | ICD-10-CM | POA: Diagnosis not present

## 2019-10-13 LAB — COMPREHENSIVE METABOLIC PANEL
ALT: 10 U/L (ref 0–44)
AST: 14 U/L — ABNORMAL LOW (ref 15–41)
Albumin: 3.6 g/dL (ref 3.5–5.0)
Alkaline Phosphatase: 67 U/L (ref 38–126)
Anion gap: 10 (ref 5–15)
BUN: 21 mg/dL (ref 8–23)
CO2: 25 mmol/L (ref 22–32)
Calcium: 8.7 mg/dL — ABNORMAL LOW (ref 8.9–10.3)
Chloride: 103 mmol/L (ref 98–111)
Creatinine, Ser: 1.36 mg/dL — ABNORMAL HIGH (ref 0.44–1.00)
GFR calc Af Amer: 40 mL/min — ABNORMAL LOW (ref >60)
GFR calc non Af Amer: 34 mL/min — ABNORMAL LOW (ref >60)
Glucose, Bld: 128 mg/dL — ABNORMAL HIGH (ref 70–99)
Potassium: 3.6 mmol/L (ref 3.5–5.1)
Sodium: 138 mmol/L (ref 135–145)
Total Bilirubin: 1 mg/dL (ref 0.3–1.2)
Total Protein: 6.8 g/dL (ref 6.5–8.1)

## 2019-10-13 LAB — TYPE AND SCREEN
ABO/RH(D): O POS
Antibody Screen: NEGATIVE

## 2019-10-13 LAB — CBC
HCT: 38.9 % (ref 36.0–46.0)
Hemoglobin: 12.4 g/dL (ref 12.0–15.0)
MCH: 29.7 pg (ref 26.0–34.0)
MCHC: 31.9 g/dL (ref 30.0–36.0)
MCV: 93.3 fL (ref 80.0–100.0)
Platelets: 120 10*3/uL — ABNORMAL LOW (ref 150–400)
RBC: 4.17 MIL/uL (ref 3.87–5.11)
RDW: 13.5 % (ref 11.5–15.5)
WBC: 8.2 10*3/uL (ref 4.0–10.5)
nRBC: 0 % (ref 0.0–0.2)

## 2019-10-14 ENCOUNTER — Other Ambulatory Visit: Payer: Self-pay

## 2019-10-14 ENCOUNTER — Other Ambulatory Visit
Admission: RE | Admit: 2019-10-14 | Discharge: 2019-10-14 | Disposition: A | Discharge status: Home / Self Care | Payer: PPO | Source: Ambulatory Visit | Attending: Obstetrics and Gynecology | Admitting: Obstetrics and Gynecology

## 2019-10-14 DIAGNOSIS — I10 Essential (primary) hypertension: Secondary | ICD-10-CM | POA: Insufficient documentation

## 2019-10-14 DIAGNOSIS — Z01818 Encounter for other preprocedural examination: Secondary | ICD-10-CM | POA: Diagnosis not present

## 2019-10-14 DIAGNOSIS — Z20822 Contact with and (suspected) exposure to covid-19: Secondary | ICD-10-CM | POA: Insufficient documentation

## 2019-10-14 LAB — SARS CORONAVIRUS 2 (TAT 6-24 HRS): SARS Coronavirus 2: NEGATIVE

## 2019-10-18 ENCOUNTER — Other Ambulatory Visit: Payer: Self-pay

## 2019-10-18 ENCOUNTER — Encounter
Admission: RE | Disposition: A | Discharge status: Home / Self Care | Payer: Self-pay | Source: Home / Self Care | Attending: Obstetrics and Gynecology

## 2019-10-18 ENCOUNTER — Ambulatory Visit: Payer: PPO | Admitting: Certified Registered Nurse Anesthetist

## 2019-10-18 ENCOUNTER — Ambulatory Visit
Admission: RE | Admit: 2019-10-18 | Discharge: 2019-10-18 | Disposition: A | Discharge status: Home / Self Care | Payer: PPO | Attending: Obstetrics and Gynecology | Admitting: Obstetrics and Gynecology

## 2019-10-18 ENCOUNTER — Encounter: Payer: Self-pay | Admitting: Obstetrics and Gynecology

## 2019-10-18 DIAGNOSIS — Z881 Allergy status to other antibiotic agents status: Secondary | ICD-10-CM | POA: Insufficient documentation

## 2019-10-18 DIAGNOSIS — Z7689 Persons encountering health services in other specified circumstances: Secondary | ICD-10-CM | POA: Diagnosis not present

## 2019-10-18 DIAGNOSIS — F419 Anxiety disorder, unspecified: Secondary | ICD-10-CM | POA: Diagnosis not present

## 2019-10-18 DIAGNOSIS — Z9049 Acquired absence of other specified parts of digestive tract: Secondary | ICD-10-CM | POA: Diagnosis not present

## 2019-10-18 DIAGNOSIS — E039 Hypothyroidism, unspecified: Secondary | ICD-10-CM | POA: Diagnosis not present

## 2019-10-18 DIAGNOSIS — Z882 Allergy status to sulfonamides status: Secondary | ICD-10-CM | POA: Insufficient documentation

## 2019-10-18 DIAGNOSIS — C541 Malignant neoplasm of endometrium: Secondary | ICD-10-CM | POA: Diagnosis not present

## 2019-10-18 DIAGNOSIS — E785 Hyperlipidemia, unspecified: Secondary | ICD-10-CM | POA: Insufficient documentation

## 2019-10-18 DIAGNOSIS — N8502 Endometrial intraepithelial neoplasia [EIN]: Secondary | ICD-10-CM

## 2019-10-18 DIAGNOSIS — I129 Hypertensive chronic kidney disease with stage 1 through stage 4 chronic kidney disease, or unspecified chronic kidney disease: Secondary | ICD-10-CM | POA: Diagnosis not present

## 2019-10-18 DIAGNOSIS — Z888 Allergy status to other drugs, medicaments and biological substances status: Secondary | ICD-10-CM | POA: Insufficient documentation

## 2019-10-18 DIAGNOSIS — Z8249 Family history of ischemic heart disease and other diseases of the circulatory system: Secondary | ICD-10-CM | POA: Insufficient documentation

## 2019-10-18 DIAGNOSIS — Z89422 Acquired absence of other left toe(s): Secondary | ICD-10-CM | POA: Insufficient documentation

## 2019-10-18 DIAGNOSIS — Z853 Personal history of malignant neoplasm of breast: Secondary | ICD-10-CM | POA: Diagnosis not present

## 2019-10-18 DIAGNOSIS — K589 Irritable bowel syndrome without diarrhea: Secondary | ICD-10-CM | POA: Insufficient documentation

## 2019-10-18 DIAGNOSIS — E890 Postprocedural hypothyroidism: Secondary | ICD-10-CM | POA: Diagnosis not present

## 2019-10-18 DIAGNOSIS — N950 Postmenopausal bleeding: Secondary | ICD-10-CM

## 2019-10-18 DIAGNOSIS — I10 Essential (primary) hypertension: Secondary | ICD-10-CM | POA: Diagnosis not present

## 2019-10-18 DIAGNOSIS — Z79899 Other long term (current) drug therapy: Secondary | ICD-10-CM | POA: Insufficient documentation

## 2019-10-18 DIAGNOSIS — N183 Chronic kidney disease, stage 3 unspecified: Secondary | ICD-10-CM | POA: Diagnosis not present

## 2019-10-18 HISTORY — PX: HYSTEROSCOPY WITH D & C: SHX1775

## 2019-10-18 LAB — ABO/RH: ABO/RH(D): O POS

## 2019-10-18 SURGERY — DILATATION AND CURETTAGE /HYSTEROSCOPY
Anesthesia: General

## 2019-10-18 MED ORDER — ONDANSETRON HCL 4 MG/2ML IJ SOLN: 4.0000 mg | Freq: Once | INTRAMUSCULAR | Status: DC | PRN

## 2019-10-18 MED ORDER — ONDANSETRON HCL 4 MG/2ML IJ SOLN
INTRAMUSCULAR | Status: DC | PRN
  Administered 2019-10-18: 4 mg via INTRAVENOUS

## 2019-10-18 MED ORDER — DEXAMETHASONE SODIUM PHOSPHATE 10 MG/ML IJ SOLN
INTRAMUSCULAR | Status: DC | PRN
  Administered 2019-10-18: 6 mg via INTRAVENOUS

## 2019-10-18 MED ORDER — FAMOTIDINE 20 MG PO TABS: 20.0000 mg | ORAL_TABLET | Freq: Once | ORAL | Status: AC

## 2019-10-18 MED ORDER — FENTANYL CITRATE (PF) 100 MCG/2ML IJ SOLN: 25.0000 ug | INTRAMUSCULAR | Status: DC | PRN

## 2019-10-18 MED ORDER — LACTATED RINGERS IV SOLN: INTRAVENOUS | Status: DC

## 2019-10-18 MED ORDER — ACETAMINOPHEN 500 MG PO TABS: 1000.0000 mg | ORAL_TABLET | Freq: Four times a day (QID) | ORAL | 0 refills | Status: AC | PRN

## 2019-10-18 MED ORDER — DEXAMETHASONE SODIUM PHOSPHATE 10 MG/ML IJ SOLN
INTRAMUSCULAR | Status: AC
  Filled 2019-10-18: qty 1

## 2019-10-18 MED ORDER — PROPOFOL 10 MG/ML IV BOLUS
INTRAVENOUS | Status: AC
  Filled 2019-10-18: qty 20

## 2019-10-18 MED ORDER — FENTANYL CITRATE (PF) 100 MCG/2ML IJ SOLN
INTRAMUSCULAR | Status: DC | PRN
  Administered 2019-10-18 (×4): 25 ug via INTRAVENOUS

## 2019-10-18 MED ORDER — LIDOCAINE HCL (PF) 2 % IJ SOLN
INTRAMUSCULAR | Status: AC
  Filled 2019-10-18: qty 5

## 2019-10-18 MED ORDER — FENTANYL CITRATE (PF) 100 MCG/2ML IJ SOLN
INTRAMUSCULAR | Status: AC
  Filled 2019-10-18: qty 2

## 2019-10-18 MED ORDER — ONDANSETRON HCL 4 MG/2ML IJ SOLN
INTRAMUSCULAR | Status: AC
  Filled 2019-10-18: qty 2

## 2019-10-18 MED ORDER — EPHEDRINE SULFATE 50 MG/ML IJ SOLN
INTRAMUSCULAR | Status: DC | PRN
  Administered 2019-10-18: 5 mg via INTRAVENOUS

## 2019-10-18 MED ORDER — PROPOFOL 10 MG/ML IV BOLUS
INTRAVENOUS | Status: DC | PRN
  Administered 2019-10-18: 140 mg via INTRAVENOUS

## 2019-10-18 MED ORDER — SOD CITRATE-CITRIC ACID 500-334 MG/5ML PO SOLN
30.0000 mL | ORAL | Status: DC
  Filled 2019-10-18: qty 30

## 2019-10-18 MED ORDER — FAMOTIDINE 20 MG PO TABS
ORAL_TABLET | ORAL | Status: AC
  Administered 2019-10-18: 20 mg via ORAL
  Filled 2019-10-18: qty 1

## 2019-10-18 MED ORDER — LIDOCAINE HCL (CARDIAC) PF 100 MG/5ML IV SOSY
PREFILLED_SYRINGE | INTRAVENOUS | Status: DC | PRN
  Administered 2019-10-18: 100 mg via INTRAVENOUS

## 2019-10-18 SURGICAL SUPPLY — 23 items
CATH ROBINSON RED A/P 16FR (CATHETERS) ×3 IMPLANT
CNTNR SPEC 2.5X3XGRAD LEK (MISCELLANEOUS) ×1
CONT SPEC 4OZ STER OR WHT (MISCELLANEOUS) ×2
CONTAINER SPEC 2.5X3XGRAD LEK (MISCELLANEOUS) IMPLANT
DEVICE MYOSURE LITE (MISCELLANEOUS) ×2 IMPLANT
DEVICE MYOSURE REACH (MISCELLANEOUS) IMPLANT
ELECT REM PT RETURN 9FT ADLT (ELECTROSURGICAL)
ELECTRODE REM PT RTRN 9FT ADLT (ELECTROSURGICAL) IMPLANT
GLOVE BIOGEL PI IND STRL 6.5 (GLOVE) ×2 IMPLANT
GLOVE BIOGEL PI INDICATOR 6.5 (GLOVE) ×4
GLOVE SURG SYN 6.5 ES PF (GLOVE) ×3 IMPLANT
GLOVE SURG SYN 6.5 PF PI (GLOVE) ×1 IMPLANT
GOWN STRL REUS W/ TWL LRG LVL3 (GOWN DISPOSABLE) ×2 IMPLANT
GOWN STRL REUS W/TWL LRG LVL3 (GOWN DISPOSABLE) ×4
KIT PROCEDURE FLUENT (KITS) ×2 IMPLANT
PACK DNC HYST (MISCELLANEOUS) ×3 IMPLANT
PAD OB MATERNITY 4.3X12.25 (PERSONAL CARE ITEMS) ×3 IMPLANT
PAD PREP 24X41 OB/GYN DISP (PERSONAL CARE ITEMS) ×3 IMPLANT
SEAL ROD LENS SCOPE MYOSURE (ABLATOR) ×3 IMPLANT
SOL .9 NS 3000ML IRR  AL (IV SOLUTION) ×2
SOL .9 NS 3000ML IRR UROMATIC (IV SOLUTION) ×1 IMPLANT
SYR 10ML LL (SYRINGE) ×2 IMPLANT
TOWEL OR 17X26 4PK STRL BLUE (TOWEL DISPOSABLE) ×3 IMPLANT

## 2019-10-18 NOTE — Transfer of Care (Signed)
Immediate Anesthesia Transfer of Care Note  Patient: Whitney Simmons  Procedure(s) Performed: DILATATION AND CURETTAGE /HYSTEROSCOPY (N/A )  Patient Location: PACU  Anesthesia Type:General  Level of Consciousness: awake and alert   Airway & Oxygen Therapy: Patient Spontanous Breathing and Patient connected to face mask oxygen  Post-op Assessment: Report given to RN and Post -op Vital signs reviewed and stable  Post vital signs: Reviewed and stable  Last Vitals:  Vitals Value Taken Time  BP 153/61 10/18/19 1037  Temp    Pulse 76 10/18/19 1039  Resp 15 10/18/19 1039  SpO2 100 % 10/18/19 1039  Vitals shown include unvalidated device data.  Last Pain:  Vitals:   10/18/19 0808  TempSrc: Oral  PainSc: 0-No pain         Complications: No apparent anesthesia complications

## 2019-10-18 NOTE — Op Note (Signed)
Operative Note  10/18/2019  PRE-OP DIAGNOSIS: Postmenopausal bleeding  POST-OP DIAGNOSIS: same   SURGEON: Jeanmarie Mccowen MD  PROCEDURE: Procedure(s): DILATATION AND CURETTAGE Whitney Simmons  ANESTHESIA: Choice   ESTIMATED BLOOD LOSS: 5 cc   SPECIMENS:  Endometrial curetting   FLUID DEFICIT: [id number] cc  COMPLICATIONS: None  DISPOSITION: PACU - hemodynamically stable.  CONDITION: stable  FINDINGS: Exam under anesthesia revealed  9 cm uterus with bilateral adnexa without masses or fullness. Hysteroscopy revealed an anterior uterine mass with irregular contour, not consistent with fibroid or polyp. Mass obscured the tubal ostia. Normal appearing endocervical canal.  PROCEDURE IN DETAIL: After informed consent was obtained, the patient was taken to the operating room where anesthesia was obtained without difficulty. The patient was positioned in the dorsal lithotomy position in [place]. The patient's bladder was catheterized with an in and out foley catheter. The patient was examined under anesthesia, with the above noted findings. The weighted speculum was placed inside the patient's vagina, and the the anterior lip of the cervix was seen and grasped with the tenaculum.  The uterine cavity was sounded to 9 cm, and then the cervix was progressively dilated to a 15 French-Pratt dilator. The 0 degree hysteroscope was introduced, with saline fluid used to distend the intrauterine cavity, with the above noted findings.  The Myosure was used to sample the anterior mass under direct visualization and sample the endometrium. Once the cavity was sampled entirely the hysteroscope was removed.   The uterine cavity was gently curetted until a gritty texture was noted, yielding endometrial curettings. Excellent hemostasis was noted, and all instruments were removed, with excellent hemostasis noted throughout. She was then taken out of dorsal lithotomy. Minimal discrepancy in fluid was  noted.  The patient tolerated the procedure well. Sponge, lap and needle counts were correct x2. The patient was taken to recovery room in excellent condition.  Adrian Prows MD [facility], [facility] 10/18/2019 10:37 AM

## 2019-10-18 NOTE — Discharge Instructions (Signed)
AMBULATORY SURGERY  DISCHARGE INSTRUCTIONS   1) The drugs that you were given will stay in your system until tomorrow so for the next 24 hours you should not:  A) Drive an automobile B) Make any legal decisions C) Drink any alcoholic beverage   2) You may resume regular meals tomorrow.  Today it is better to start with liquids and gradually work up to solid foods.  You may eat anything you prefer, but it is better to start with liquids, then soup and crackers, and gradually work up to solid foods.   3) Please notify your doctor immediately if you have any unusual bleeding, trouble breathing, redness and pain at the surgery site, drainage, fever, or pain not relieved by medication.    4) Additional Instructions:        Please contact your physician with any problems or Same Day Surgery at [phone], Monday through Friday 6 am to 4 pm, or [place] [facility] number at [phone].Hysteroscopy, Care After This sheet gives you information about how to care for yourself after your procedure. Your health care provider may also give you more specific instructions. If you have problems or questions, contact your health care provider. What can I expect after the procedure? After the procedure, it is common to have:  Cramping.  Bleeding. This can vary from light spotting to menstrual-like bleeding. Follow these instructions at home: Activity  Rest for 1-2 days after the procedure.  Do not douche, use tampons, or have sex for 2 weeks after the procedure, or until your health care provider approves.  Do not drive for 24 hours after the procedure, or for as long as told by your health care provider.  Do not drive, use heavy machinery, or drink alcohol while taking prescription pain medicines. Medicines   Take over-the-counter and prescription medicines only as told by your health care provider.  Do not take aspirin during recovery. It can increase the risk of  bleeding. General instructions  Do not take baths, swim, or use a hot tub until your health care provider approves. Take showers instead of baths for 2 weeks, or for as long as told by your health care provider.  To prevent or treat constipation while you are taking prescription pain medicine, your health care provider may recommend that you: ? Drink enough fluid to keep your urine clear or pale yellow. ? Take over-the-counter or prescription medicines. ? Eat foods that are high in fiber, such as fresh fruits and vegetables, whole grains, and beans. ? Limit foods that are high in fat and processed sugars, such as fried and sweet foods.  Keep all follow-up visits as told by your health care provider. This is important. Contact a health care provider if:  You feel dizzy or lightheaded.  You feel nauseous.  You have abnormal vaginal discharge.  You have a rash.  You have pain that does not get better with medicine.  You have chills. Get help right away if:  You have bleeding that is heavier than a normal menstrual period.  You have a fever.  You have pain or cramps that get worse.  You develop new abdominal pain.  You faint.  You have pain in your shoulders.  You have shortness of breath. Summary  After the procedure, you may have cramping and some vaginal bleeding.  Do not douche, use tampons, or have sex for 2 weeks after the procedure, or until your health care provider approves.  Do not take   baths, swim, or use a hot tub until your health care provider approves. Take showers instead of baths for 2 weeks, or for as long as told by your health care provider.  Report any unusual symptoms to your health care provider.  Keep all follow-up visits as told by your health care provider. This is important. This information is not intended to replace advice given to you by your health care provider. Make sure you discuss any questions you have with your health care  provider. Document Revised: 05/29/2017 Document Reviewed: 07/15/2016 Elsevier Patient Education  2020 [employer] Inc[employer]  

## 2019-10-18 NOTE — Anesthesia Procedure Notes (Signed)
Procedure Name: LMA Insertion Performed by: Zissel Biederman, CRNA Pre-anesthesia Checklist: Patient identified, Patient being monitored, Timeout performed, Emergency Drugs available and Suction available Patient Re-evaluated:Patient Re-evaluated prior to induction Oxygen Delivery Method: Circle system utilized Preoxygenation: Pre-oxygenation with 100% oxygen Induction Type: IV induction Ventilation: Mask ventilation without difficulty LMA: LMA inserted LMA Size: 3.5 Tube type: Oral Number of attempts: 1 Placement Confirmation: positive ETCO2 and breath sounds checked- equal and bilateral Tube secured with: Tape Dental Injury: Teeth and Oropharynx as per pre-operative assessment        

## 2019-10-18 NOTE — Anesthesia Preprocedure Evaluation (Signed)
Anesthesia Evaluation  Patient identified by MRN, date of birth, ID band Patient awake    Reviewed: Allergy & Precautions, NPO status , Patient's Chart, lab work & pertinent test results  History of Anesthesia Complications Negative for: history of anesthetic complications  Airway Mallampati: II  TM Distance: >3 FB Neck ROM: Full    Dental no notable dental hx.    Pulmonary neg pulmonary ROS, neg sleep apnea, neg COPD,    breath sounds clear to auscultation- rhonchi (-) wheezing      Cardiovascular hypertension, Pt. on medications (-) CAD, (-) Past MI, (-) Cardiac Stents and (-) CABG  Rhythm:Regular Rate:Normal - Systolic murmurs and - Diastolic murmurs    Neuro/Psych neg Seizures Anxiety negative neurological ROS     GI/Hepatic negative GI ROS, Neg liver ROS,   Endo/Other  neg diabetesHypothyroidism   Renal/GU CRFRenal disease     Musculoskeletal negative musculoskeletal ROS (+)   Abdominal (+) - obese,   Peds  Hematology negative hematology ROS (+)   Anesthesia Other Findings Past Medical History: 2015: Breast cancer ([place])     Comment:  LT LUMPECTOMY 01/31/2014: Breast microcalcification, mammographic 02-15-14: Cancer ([place])     Comment:  DCIS left breast, 3.2 cm intermediate grade, ER/PR:               100%. Declined postoperative radiation and antiestrogen               therapy. No date: Edema No date: Hyperlipidemia No date: Hypertension No date: Hypothyroidism No date: IBS (irritable bowel syndrome) No date: Irregular heart beat No date: Thyroid disease     Comment:  s/p radioactive iodine ablation 30 years ago   Reproductive/Obstetrics                             Anesthesia Physical Anesthesia Plan  ASA: III  Anesthesia Plan: General   Post-op Pain Management:    Induction: Intravenous  PONV Risk Score and Plan: 2 and Ondansetron, Dexamethasone and Treatment may vary  due to age or medical condition  Airway Management Planned: LMA  Additional Equipment:   Intra-op Plan:   Post-operative Plan:   Informed Consent: I have reviewed the patients History and Physical, chart, labs and discussed the procedure including the risks, benefits and alternatives for the proposed anesthesia with the patient or authorized representative who has indicated his/her understanding and acceptance.     Dental advisory given  Plan Discussed with: CRNA and Anesthesiologist  Anesthesia Plan Comments:         Anesthesia Quick Evaluation

## 2019-10-18 NOTE — Anesthesia Postprocedure Evaluation (Signed)
Anesthesia Post Note  Patient: Tyshema Kult Mcdowell  Procedure(s) Performed: DILATATION AND CURETTAGE /HYSTEROSCOPY (N/A )  Patient location during evaluation: PACU Anesthesia Type: General Level of consciousness: awake and alert and oriented Pain management: pain level controlled Vital Signs Assessment: post-procedure vital signs reviewed and stable Respiratory status: spontaneous breathing, nonlabored ventilation and respiratory function stable Cardiovascular status: blood pressure returned to baseline and stable Postop Assessment: no signs of nausea or vomiting Anesthetic complications: no     Last Vitals:  Vitals:   10/18/19 1102 10/18/19 1109  BP: (!) 154/58 (!) 152/62  Pulse: 71 72  Resp: 18 16  Temp: (!) 36.1 C (!) 36.3 C  SpO2:  96%    Last Pain:  Vitals:   10/18/19 1109  TempSrc: Temporal  PainSc: 0-No pain                 Danyah Guastella

## 2019-10-18 NOTE — Interval H&P Note (Signed)
History and Physical Interval Note:  10/18/2019 9:08 AM  Whitney Simmons  has presented today for surgery, with the diagnosis of postmenopausal bleeding.  The various methods of treatment have been discussed with the patient and family. After consideration of risks, benefits and other options for treatment, the patient has consented to  Procedure(s): DILATATION AND CURETTAGE /HYSTEROSCOPY (N/A) as a surgical intervention.  The patient's history has been reviewed, patient examined, no change in status, stable for surgery.  I have reviewed the patient's chart and labs.  Questions were answered to the patient's satisfaction.     [name]

## 2019-10-20 LAB — SURGICAL PATHOLOGY

## 2019-10-21 ENCOUNTER — Other Ambulatory Visit: Payer: Self-pay | Admitting: Obstetrics and Gynecology

## 2019-10-21 DIAGNOSIS — C541 Malignant neoplasm of endometrium: Secondary | ICD-10-CM

## 2019-10-25 ENCOUNTER — Encounter: Payer: Self-pay | Admitting: Obstetrics and Gynecology

## 2019-10-25 ENCOUNTER — Other Ambulatory Visit: Payer: Self-pay

## 2019-10-25 ENCOUNTER — Ambulatory Visit (INDEPENDENT_AMBULATORY_CARE_PROVIDER_SITE_OTHER): Payer: PPO | Admitting: Obstetrics and Gynecology

## 2019-10-25 VITALS — BP 132/72 | Ht 67.0 in | Wt 175.0 lb

## 2019-10-25 DIAGNOSIS — C541 Malignant neoplasm of endometrium: Secondary | ICD-10-CM | POA: Insufficient documentation

## 2019-10-25 DIAGNOSIS — Z9889 Other specified postprocedural states: Secondary | ICD-10-CM

## 2019-10-25 DIAGNOSIS — N950 Postmenopausal bleeding: Secondary | ICD-10-CM

## 2019-10-25 NOTE — Progress Notes (Signed)
Postoperative Follow-up Patient presents post op from diagnostic hysteroscopy for postmenopausal bleeding, 1 week ago.  Subjective: Patient reports marked improvement in her preop symptoms. Eating a regular diet without difficulty. The patient is not having any pain.  Activity: normal activities of daily living. Patient reports additional symptom's since surgery of None.  Objective: BP 132/72   Ht 5\' 7"  (1.702 m)   Wt 175 lb (79.4 kg)   BMI 27.41 kg/m  Physical Exam Constitutional:      Appearance: She is well-developed.  Genitourinary:     Vagina and uterus normal.     No lesions in the vagina.     No cervical motion tenderness.     No right or left adnexal mass present.  HENT:     Head: Normocephalic and atraumatic.  Neck:     Thyroid: No thyromegaly.  Cardiovascular:     Rate and Rhythm: Normal rate and regular rhythm.     Heart sounds: Normal heart sounds.  Pulmonary:     Effort: Pulmonary effort is normal.     Breath sounds: Normal breath sounds.  Chest:     Breasts:        Right: No inverted nipple, mass, nipple discharge or skin change.        Left: No inverted nipple, mass, nipple discharge or skin change.  Abdominal:     General: Bowel sounds are normal. There is no distension.     Palpations: Abdomen is soft. There is no mass.  Musculoskeletal:     Cervical back: Neck supple.  Neurological:     Mental Status: She is alert and oriented to person, place, and time.  Skin:    General: Skin is warm and dry.  Psychiatric:        Behavior: Behavior normal.        Thought Content: Thought content normal.        Judgment: Judgment normal.  Vitals reviewed.   Assessment: s/p :  diagnostic hysteroscopy stable  Plan: Patient has done well after surgery with no apparent complications.  I have discussed the post-operative course to date, and the expected progress moving forward.  The patient understands what complications to be concerned about.  I will see the  patient in routine follow up, or sooner if needed.    Discussed pathology result with patient which showed endometrial adenocarcinoma, endometrioid type, FIGO grade 1.  Have previously discussed this on the phone with the patient.  She is seeing gynecological oncology tomorrow for consultation.  We have discussed surgical management of endometrial adenocarcinoma and she is considering this procedure.  She expresses today that given her advanced age she is not sure if she would like to proceed with surgery.  She feels like the [facility]liche has blesses her with many long years and she is not certain if she would like to have surgery.  We have previously discussed a pelvic MRI and she is not interested in having an MRI because she struggles with claustrophobia as well as lower back pain.  I advised her to get the opinion of gynecological oncology.  They may be okay with her having a chest x-ray alone preoperatively.  She wonders if she can see the chiropractor for her degenerative disc disease and I told her that this would be okay to do so.  She is present in the office today with [name], her daughter.  We discussed postoperative expectations if she does decide to have surgery.  We will follow up in  2 weeks with the patient.  Activity plan: No restriction. Pelvic rest.  Telma Pyeatt R Tafari Humiston 10/25/2019, 5:45 PM

## 2019-10-26 ENCOUNTER — Other Ambulatory Visit: Payer: Self-pay

## 2019-10-26 ENCOUNTER — Inpatient Hospital Stay: Payer: PPO | Attending: Obstetrics and Gynecology | Admitting: Obstetrics and Gynecology

## 2019-10-26 ENCOUNTER — Encounter: Payer: Self-pay | Admitting: Obstetrics and Gynecology

## 2019-10-26 VITALS — BP 135/54 | HR 68 | Temp 97.9°F | Resp 16 | Ht 67.0 in | Wt 173.6 lb

## 2019-10-26 DIAGNOSIS — C541 Malignant neoplasm of endometrium: Secondary | ICD-10-CM | POA: Diagnosis not present

## 2019-10-26 NOTE — Progress Notes (Signed)
Gynecologic Oncology Consult Visit   Referring Provider: Adrian Prows, MD  Chief Concern: endometrial adenocarcinoma, endometrioid type, FIGO grade 1  Subjective:  Whitney Simmons is a 86 y.o. female who is seen in consultation from Dr. Gilman Schmidt for endometrial adenocarcinoma, endometrioid type, FIGO grade 1.    She presented with postmenopausal bleeding. Work up included ultrasound, D&C, Myosure/Hysteroscopy.  09/05/2019 Pelvic ultrasound Findings:  The uterus is anteverted and measures 8.3 x 5.0 x 3.9 cm. Echo texture is heterogenous without evidence of focal masses. The Endometrium measures 5.2 mm. There is an echogenic mass within the endometrium measuring 22.2 x 15.2 x 21.0 mm. Minimal blood flow is seen within.   Bilateral ovaries are not visualized.  Survey of the adnexa demonstrates no adnexal masses. There is no free fluid in the cul de sac.  Impression: 1. There is an echogenic mass within the endometrial canal.  2. The endometrial lining is slightly thick for a postmenopausal woman. 3. Neither ovary is visualized.    10/18/2019 D&C, Myosure/Hysteroscopy Exam under anesthesia revealed  9 cm uterus with bilateral adnexa without masses or fullness. Hysteroscopy revealed an anterior uterine mass with irregular contour, not consistent with fibroid or polyp. Mass obscured the tubal ostia. Normal appearing endocervical canal.  A. ENDOMETRIUM, CURETTAGE:  - ENDOMETRIAL ADENOCARCINOMA, ENDOMETRIOID TYPE, FIGO GRADE 1.  - BACKGROUND ATYPICAL HYPERPLASIA / EIN.   She presents today for evaluation and management. Since the procedure she has some vaginal spotting. She is concerned about surgery due to risks. She is interested in alternative options. She is accompanied to clinic with her daughter Whitney Simmons who leaves near [place], [place].     Problem List: Patient Active Problem List   Diagnosis Date Noted  . Endometrial cancer, grade I ([place]) 10/25/2019  .  Postmenopausal bleeding   . Preoperative exam for gynecologic surgery 10/05/2019  . Hallucination, visual 10/05/2019  . Vaginal discharge 06/30/2019  . Difficulty hearing 12/06/2018  . Bruising 05/31/2018  . Gait instability 09/22/2017  . Nevus 05/29/2017  . Callus of foot 01/05/2017  . Thrombocytopenia ([place]) 11/06/2015  . Osteoporosis 03/07/2015  . Postmenopausal estrogen deficiency 01/23/2015  . Glaucoma 05/12/2014  . History of breast cancer 02/13/2014  . IBS (irritable bowel syndrome) 12/09/2013  . Palpitations 09/01/2012  . Chronic kidney disease, stage III (moderate) 08/18/2012  . Anxiety 08/02/2012  . Hypothyroidism 07/30/2012  . Hyperlipidemia 07/30/2012  . Hypertension 07/30/2012    Past Medical History: Past Medical History:  Diagnosis Date  . Breast microcalcification, mammographic 01/31/2014  . 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 Declined postoperative radiation and antiestrogen therapy.  . Edema   . Hyperlipidemia   . Hypertension   . Hypothyroidism   . IBS (irritable bowel syndrome)   . Irregular heart beat   . stage III kidney disease   . Thyroid disease    s/p radioactive iodine ablation 30 years ago    Past Surgical History: Past Surgical History:  Procedure Laterality Date  . BREAST LUMPECTOMY Left 2015   DUCTAL CARCINOMA IN SITU WITH CALCIFICATIONS  . BREAST SURGERY Left 02-15-14   wide excision  . CATARACT EXTRACTION    . CHOLECYSTECTOMY    . HYSTEROSCOPY WITH D & C N/A 10/18/2019   Procedure: DILATATION AND CURETTAGE /HYSTEROSCOPY;  Surgeon: Homero Fellers, MD;  Location: [facility] ORS;  Service: Gynecology;  Laterality: N/A;  . TOE AMPUTATION     left 5th toe    Past Gynecologic  History:  Menarche: 15 Menses regular: She does not remember her menstrual h/o or when she underwent menopause. History of Abnormal pap: No Last pap: Unknown  Mammogram 03/2019  OB History:  OB History  Gravida  Para Term Preterm AB Living  2         2  SAB TAB Ectopic Multiple Live Births               # Outcome Date GA Lbr Len/2nd Weight Sex Delivery Anes PTL Lv  2 Gravida           1 Gravida             Family History: Family History  Problem Relation Age of Onset  . Heart disease Mother   . Heart disease Father   . Heart attack Father   . Heart disease Maternal Aunt   . Heart disease Brother   . AAA (abdominal aortic aneurysm) Other   . Stroke Maternal Aunt   . Breast cancer Neg Hx     Social History: Social History   Socioeconomic History  . Marital status: Married    Spouse name: Not on file  . Number of children: Not on file  . Years of education: Not on file  . Highest education level: Not on file  Occupational History  . Not on file  Tobacco Use  . Smoking status: Never Smoker  . Smokeless tobacco: Never Used  Substance and Sexual Activity  . Alcohol use: No  . Drug use: No  . Sexual activity: Not Currently    Birth control/protection: Post-menopausal  Other Topics Concern  . Not on file  Social History Narrative   Lives in [place]. From [place]. Has daughter.      Work- Retired Psychologist, prison and probation services Strain: [place]   . Difficulty of Paying Living Expenses: Not hard at all  Food Insecurity: No Food Insecurity  . Worried About Charity fundraiser in the Last Year: Never true  . Ran Out of Food in the Last Year: Never true  Transportation Needs: No Transportation Needs  . Lack of Transportation (Medical): No  . Lack of Transportation (Non-Medical): No  Physical Activity:   . Days of Exercise per Week:   . Minutes of Exercise per Session:   Stress: No Stress Concern Present  . Feeling of Stress : Not at all  Social Connections:   . Frequency of Communication with Friends and Family:   . Frequency of Social Gatherings with Friends and Family:   . Attends Religious Services:   . Active Member  of Clubs or Organizations:   . Attends Archivist Meetings:   Marland Kitchen Marital Status:   Intimate Partner Violence: Not At Risk  . Fear of Current or Ex-Partner: No  . Emotionally Abused: No  . Physically Abused: No  . Sexually Abused: No    Allergies: Allergies  Allergen Reactions  . Levaquin [Levofloxacin In D5w] Other (See Comments)    hallucinations  . Sulfa Antibiotics     Hallucinations     Current Medications: Current Outpatient Medications  Medication Sig Dispense Refill  . acetaminophen (TYLENOL) 500 MG tablet Take 2 tablets (1,000 mg total) by mouth every 6 (six) hours as needed for up to 8 days for moderate pain. 60 tablet 0  . furosemide (LASIX) 20 MG tablet Take 1 tablet (20 mg total) by mouth daily. 90 tablet 2  .  latanoprost (XALATAN) 0.005 % ophthalmic solution Place 1 drop into both eyes at bedtime.     Marland Kitchen levothyroxine (EUTHYROX) 88 MCG tablet TAKE 1 TABLET BY MOUTH ONCE DAILY BEFORE BREAKFAST 90 tablet 3  . losartan (COZAAR) 50 MG tablet Take 1 tablet (50 mg total) by mouth daily. 90 tablet 2  . Multiple Vitamin (MULTIVITAMIN) tablet Take 1 tablet by mouth daily.    . nebivolol (BYSTOLIC) 5 MG tablet Take 1 tablet (5 mg total) by mouth daily. 90 tablet 2   No current facility-administered medications for this visit.    Review of Systems General: weight loss; o/w negative for fevers, changes or night sweats Skin: negative for changes in moles or sores or rash Eyes: vision issues  HEENT: hard of hearing Pulmonary: sinus congestion/cough o/w negative for dyspnea, orthopnea,or  wheezing Cardiac: negative for palpitations, pain Gastrointestinal: intermittent diarrhea/constipation/nausea and hemorrhoids: negative for vomiting, hematemesis, hematochezia Genitourinary/Sexual/Obgyn: as noted in HPI; she also has a h/o urinary incontinence/urinary frequency.  Musculoskeletal: back pain due to degenerative joint disease Hematology: abnormal bleeding/spotting as  per HPI Neurologic/Psych: negative for headaches, seizures, paralysis, weakness, numbness     Objective:  Physical Examination:  BP (!) 135/54   Pulse 68   Temp 97.9 F (36.6 C) (Tympanic)   Resp 16   Ht 5\' 7"  (1.702 m)   Wt 173 lb 9.6 oz (78.7 kg)   BMI 27.19 kg/m    ECOG Performance Status: 2 - Symptomatic, <50% confined to bed  GENERAL: Patient is a well appearing female in no acute distress; she does use a walker and required assistance to get up and down from the bed.  HEENT:  Atraumatic and normocephalic; sclera anicteric NODES:  No cervical, supraclavicular, axillary, or inguinal lymphadenopathy palpated.  LUNGS:  Clear to auscultation bilaterally.  Mild expiratory wheezes.  HEART:  Regular rate and rhythm. ABDOMEN:  Soft, nontender.  Positive, normoactive bowel sounds.  MSK:  No focal spinal tenderness to palpation. Full range of motion bilaterally in the upper extremities. EXTREMITIES:  No peripheral edema.   SKIN:  Clear with no obvious rashes or skin changes. No nail dyscrasia. NEURO:  Nonfocal. Well oriented.  Appropriate affect.  Pelvic: chaperoned by CMA EGBUS: no lesions Cervix: no lesions, nontender, mobile Vagina: no lesions, no discharge or bleeding Uterus: normal size, nontender, mobile Adnexa: no palpable masses; parametria smooth and without nodularity.  Rectovaginal: deferred  Lab Review Lab Results  Component Value Date   WBC 8.2 10/13/2019   HGB 12.4 10/13/2019   HCT 38.9 10/13/2019   MCV 93.3 10/13/2019   PLT 120 (L) 10/13/2019     Chemistry      Component Value Date/Time   NA 138 10/13/2019 1326   K 3.6 10/13/2019 1326   CL 103 10/13/2019 1326   CO2 25 10/13/2019 1326   BUN 21 10/13/2019 1326   CREATININE 1.36 (H) 10/13/2019 1326   CREATININE 1.09 12/09/2013 1619      Component Value Date/Time   CALCIUM 8.7 (L) 10/13/2019 1326   ALKPHOS 67 10/13/2019 1326   AST 14 (L) 10/13/2019 1326   ALT 10 10/13/2019 1326   BILITOT 1.0  10/13/2019 1326        Radiologic Imaging:  U/S pelvis noted in HPI.   CT C/A/P without contrast ordered.      Assessment:  Whitney Simmons is a 86 y.o. female diagnosed with grade 1 endometrioid endometrial cancer.   Performance status 2.   Medical co-morbidities complicating care: Frailty;  HTN; hypothyroidism; irregular heartbeat; stage III kidney disease (last creatinine 1.36).   Plan:   Problem List Items Addressed This Visit      Genitourinary   Endometrial cancer, grade I ([place]) - Primary   Relevant Orders   CT Chest W Contrast   CT Abdomen Pelvis W Contrast      We discussed options for management including surgery, hormonal therapy, and radiation. She is very interested in hormonal therapy. Information given including the following below:   Surgery:  Risks of surgery include infection, anesthesia, bleeding, transfusion, wound separation, vaginal cuff dehiscence, medical issues (blood clots, stroke, heart attack, fluid in the lungs, pneumonia, abnormal heart rhythm, death), possible exploratory surgery with larger incision, lymphedema, lymphocyst, allergic reaction, injury to adjacent organs (bowel, bladder, blood vessels, nerves, ureters, uterus). Surgery is most effective for curing endometrial cancer.  but risk of surgery.   Hormonal therapy (Megestrol oral tablets or Mirena progestin IUD): Megesterol (MEGACE) side effect most common side effects shortness of breath, increased appetite, and shortness of breath. See information provided below.   MIRENA IUD - Less systemic absorption of progestin medication. Provided handout. Complete response in 43% of patients. Avoid most side effects of hormones noted with Megace and risks of surgery. Repeat biopsy will be needed in 3-4 months.   She is not interested in radiation therapy.   She does not want an MRI due to claustrophobia. I explained that is the way we evaluate for muscle invasion of the uterine wall. But given  her degree of claustrophobia I don't think we can obtain this test. Therefore, we need to obtain the CT scan to evaluate for metastatic disease. If negative she thinks she would like to pursue the Mirena IUD and that will be placed by Dr. Gilman Schmidt. She would like to go over all the information first before she makes a decision.   Suggested return to clinic in  4 months.    The patient's diagnosis, an outline of the further diagnostic and laboratory studies which will be required, the recommendation, and alternatives were discussed.  All questions were answered to the patient's satisfaction.  A total of 90 minutes were spent with the patient/family today; >50% was spent in education, counseling and coordination of care for endometrial cancer.    Demitrious Mccannon Gaetana Michaelis, MD

## 2019-10-26 NOTE — Patient Instructions (Signed)
Uterine Cancer  Uterine cancer is an abnormal growth of cancer tissue (malignant tumor) in the uterus. Unlike noncancerous (benign) tumors, malignant tumors can spread to other parts of the body. Uterine cancer usually occurs after menopause. However, it may also occur around the time that menopause begins. The wall of the uterus has an inner layer of tissue (endometrium) and an outer layer of muscle tissue (myometrium). The most common type of uterine cancer begins in the endometrium (endometrial cancer). Cancer that begins in the myometrium (uterine sarcoma) is very rare. What are the causes? The exact cause of this condition is not known. What increases the risk? You are more likely to develop this condition if you:  Are older than 50.  Have an enlarged endometrium (endometrial hyperplasia).  Use hormone therapy.  Are severely overweight (obese).  Use the medicine tamoxifen.  You are white (Caucasian).  Cannot bear children (are infertile).  Have never been pregnant.  Started menstruating at an age younger than 12 years.  Are older than 52 and are still having menstrual periods.  Have a history of cancer of the ovaries, intestines, or colon or rectum (colorectal cancer).  Have a history of enlarged ovaries with small cysts (polycystic ovarian syndrome).  Have a family history of: ? Uterine cancer. ? Hereditary nonpolyposis colon cancer (HNPCC).  Have diabetes, high blood pressure, thyroid disease, or gallbladder disease.  Use long-term, high-dose birth control pills.  Have been exposed to radiation.  Smoke. What are the signs or symptoms? Symptoms of this condition include:  Abnormal vaginal bleeding or discharge. Bleeding may start as a watery, blood-streaked flow that gradually contains more blood. This is the most common symptom. If you experience abnormal vaginal bleeding, do not assume that it is part of menopause.  Vaginal bleeding after  menopause.  Unexplained weight loss.  Bleeding between periods.  Urination that is difficult, painful, or more frequent than usual.  A lump (mass) in the vagina.  Pain, bloating, or fullness in the abdomen.  Pain in the pelvic area.  Pain during sex. How is this diagnosed? This condition may be diagnosed based on:  Your medical history and your symptoms.  A physical and pelvic exam. Your health care provider will feel your pelvis for any growths or enlarged lymph nodes.  Blood and urine tests.  Imaging tests, such as X-rays, CT scans, ultrasound, or MRIs.  A procedure in which a thin, flexible tube with a light and camera on the end is inserted through the vagina and used to look inside the uterus (hysteroscopy).  A Pap test to check for abnormal cells in the lower part of the uterus (cervix) and the upper vagina.  Removing a tissue sample (biopsy) from the uterine lining to check for cancer cells.  Dilation and curettage (D&C). This is a procedure that involves stretching (dilation) the cervix and scraping (curettage) the inside lining of the uterus to get a biopsy and check for cancer cells. Your cancer will be staged to determine its severity and extent. Staging is an assessment of:  The size of the tumor.  Whether the cancer has spread.  Where the cancer has spread. The stages of uterine cancer are as follows:  Stage I. The cancer is only found in the uterus.  Stage II. The cancer has spread to the cervix.  Stage III. The cancer has spread outside the uterus, but not outside the pelvis. The cancer may have spread to the lymph nodes in the pelvis. Lymph nodes are   part of your body's disease-fighting (immune) system. Lymph nodes are found in many locations in your body, including the neck, underarm, and groin.  Stage IV. The cancer has spread to other parts of the body, such as the bladder or rectum. How is this treated? This condition is often treated with surgery  to remove:  The uterus, cervix, fallopian tubes, and ovaries (total hysterectomy).  The uterus and cervix (simple hysterectomy). The type of hysterectomy you will have depends on the extent of your cancer. Lymph nodes near the uterus may also be removed in some cases. Treatment may also include one or more of the following:  Chemotherapy. This uses medicines to kill the cancer cells and prevent their spread.  Radiation therapy. This uses high-energy rays to kill the cancer cells and prevent the spread of cancer.  Chemoradiation. This is a combination treatment that alternates chemotherapy with radiation treatments to enhance the way radiation works.  Brachytherapy. This involves placing radioactive materials inside the body where the cancer was removed.  Hormone therapy. This includes taking medicines that lower the levels of estrogen in the body. Follow these instructions at home: Activity  Return to your normal activities as told by your health care provider. Ask your health care provider what activities are safe for you.  Exercise regularly as told by your health care provider.  Do not drive or use heavy machinery while taking prescription pain medicine. General instructions  Take over-the-counter and prescription medicines only as told by your health care provider.  Maintain a healthy diet.  Work with your health care provider to: ? Manage any long-term (chronic) conditions you have, such as diabetes, high blood pressure, thyroid disease, or gallbladder disease. ? Manage any side effects of your treatment.  Do not use any products that contain nicotine or tobacco, such as cigarettes and e-cigarettes. If you need help quitting, ask your health care provider.  Consider joining a support group to help you cope with stress. Your health care provider may be able to recommend a local or online support group.  Keep all follow-up visits as told by your health care provider. This is  important. Where to find more information  American Cancer Society: https://www.cancer.[place]): https://www.cancer.gov Contact a health care provider if:  You have pain in your pelvis or abdomen that gets worse.  You cannot urinate.  You have abnormal bleeding.  You have a fever. Get help right away if:  You develop sudden or new severe symptoms, such as: ? Heavy bleeding. ? Severe weakness. ? Pain that is severe or does not get better with medicine. Summary  Uterine cancer is an abnormal growth of cancer tissue (malignant tumor) in the uterus. The most common type of uterine cancer begins in the endometrium (endometrial cancer).  This condition is often treated with surgery to remove the uterus, cervix, fallopian tubes, and ovaries (total hysterectomy) or the uterus and cervix (simple hysterectomy).  Work with your health care provider to manage any long-term (chronic) conditions you have, such as diabetes, high blood pressure, thyroid disease, or gallbladder disease.  Consider joining a support group to help you cope with stress. Your health care provider may be able to recommend a local or online support group. This information is not intended to replace advice given to you by your health care provider. Make sure you discuss any questions you have with your health care provider. Document Revised: 05/29/2017 Document Reviewed: 06/13/2016 Elsevier Patient Education  2020 [employer].  TREATMENT OPTIONS  Surgery:  Risks of surgery include infection, anesthesia, bleeding, transfusion, wound separation, vaginal cuff dehiscence, medical issues (blood clots, stroke, heart attack, fluid in the lungs, pneumonia, abnormal heart rhythm, death), possible exploratory surgery with larger incision, lymphedema, lymphocyst, allergic reaction, injury to adjacent organs (bowel, bladder, blood vessels, nerves, ureters, uterus). Surgery is most effective for curing  endometrial cancer.  but risk of surgery.   Hormonal therapy (Megestrol oral tablets or Mirena progestin IUD): Megesterol (MEGACE) side effect most common side effects shortness of breath, increased appetite, and shortness of breath. See information provided below.   MIRENA IUD - Less systemic absorption of progestin medication. See handout. Complete response in 43% of patients. Avoid most side effects of hormones noted with Megace and risks of surgery.   Radiation therapy:    Megestrol tablets What is this medicine? MEGESTROL (me JES trol) belongs to a class of drugs known as progestins. Megestrol tablets are used to treat advanced breast or endometrial cancer. This medicine may be used for other purposes; ask your health care provider or pharmacist if you have questions. COMMON BRAND NAME(S): Megace What should I tell my health care provider before I take this medicine? They need to know if you have any of these conditions:  adrenal gland problems  history of blood clots of the legs, lungs, or other parts of the body  diabetes  kidney disease  liver disease  stroke  an unusual or allergic reaction to megestrol, other medicines, foods, dyes, or preservatives  pregnant or trying to get pregnant  breast-feeding How should I use this medicine? Take this medicine by mouth. Follow the directions on the prescription label. Do not take your medicine more often than directed. Take your doses at regular intervals. Do not stop taking except on the advice of your doctor or health care professional. Talk to your pediatrician regarding the use of this medicine in children. Special care may be needed. Overdosage: If you think you have taken too much of this medicine contact a poison control center or emergency room at once. NOTE: This medicine is only for you. Do not share this medicine with others. What if I miss a dose? If you miss a dose, take it as soon as you can. If it is almost  time for your next dose, take only that dose. Do not take double or extra doses. What may interact with this medicine? Do not take this medicine with any of the following medications:  dofetilide This medicine may also interact with the following medications:  indinavir This list may not describe all possible interactions. Give your health care provider a list of all the medicines, herbs, non-prescription drugs, or dietary supplements you use. Also tell them if you smoke, drink alcohol, or use illegal drugs. Some items may interact with your medicine. What should I watch for while using this medicine? Visit your doctor or health care professional for regular checks on your progress. Continue taking this medicine even if you feel better. It may take 2 months of regular use before you know if this medicine is working for your condition. This medicine can cause birth defects. Do not get pregnant while taking this drug. Females with child-bearing potential will need to have a negative pregnancy test before starting this medicine. Use an effective method of birth control while you are taking this medicine. If you think that you might be pregnant talk to your doctor right away. If you have diabetes, this medicine may affect  blood sugar levels. Check your blood sugar and talk to your doctor or health care professional if you notice changes. What side effects may I notice from receiving this medicine? Side effects that you should report to your doctor or health care professional as soon as possible: Side effects that you should report to your doctor or health care professional as soon as possible:  allergic reactions like skin rash, itching or hives, swelling of the face, lips, or tongue  breathing problems  dizziness  increased blood pressure  signs and symptoms of a blood clot such as breathing problems; changes in vision; chest pain; severe, sudden headache; pain, swelling, warmth in the leg;  trouble speaking; sudden numbness or weakness of the face, arm or leg  swelling of the ankles, feet, hands  unusually weak or tired  vomiting Side effects that usually do not require medical attention (report to your doctor or health care professional if they continue or are bothersome):  breakthrough menstrual bleeding  changes in sex drive or performance  diarrhea  gas  hot flashes or flushing  increased appetite  upset stomach  weight gain This list may not describe all possible side effects. Call your doctor for medical advice about side effects. You may report side effects to FDA at 1-800-FDA-1088. Where should I keep my medicine? Keep out of the reach of children. Store at controlled room temperature between 15 and 30 degrees C (59 and 86 degrees F). Protect from heat above 40 degrees C (104 degrees F). Throw away any unused medicine after the expiration date. NOTE: This sheet is a summary. It may not cover all possible information. If you have questions about this medicine, talk to your doctor, pharmacist, or health care provider.  2020 Elsevier/[employer] (2017-06-24 10:07:34)

## 2019-10-29 DIAGNOSIS — I2699 Other pulmonary embolism without acute cor pulmonale: Secondary | ICD-10-CM

## 2019-10-29 HISTORY — DX: Other pulmonary embolism without acute cor pulmonale: I26.99

## 2019-10-31 DIAGNOSIS — M9902 Segmental and somatic dysfunction of thoracic region: Secondary | ICD-10-CM | POA: Diagnosis not present

## 2019-10-31 DIAGNOSIS — M5134 Other intervertebral disc degeneration, thoracic region: Secondary | ICD-10-CM | POA: Diagnosis not present

## 2019-10-31 DIAGNOSIS — M9903 Segmental and somatic dysfunction of lumbar region: Secondary | ICD-10-CM | POA: Diagnosis not present

## 2019-10-31 DIAGNOSIS — M5136 Other intervertebral disc degeneration, lumbar region: Secondary | ICD-10-CM | POA: Diagnosis not present

## 2019-11-04 ENCOUNTER — Other Ambulatory Visit: Payer: Self-pay

## 2019-11-08 ENCOUNTER — Ambulatory Visit
Admission: RE | Admit: 2019-11-08 | Discharge: 2019-11-08 | Disposition: A | Discharge status: Home / Self Care | Payer: PPO | Source: Ambulatory Visit | Attending: Obstetrics and Gynecology | Admitting: Obstetrics and Gynecology

## 2019-11-08 ENCOUNTER — Other Ambulatory Visit: Payer: Self-pay

## 2019-11-08 DIAGNOSIS — C541 Malignant neoplasm of endometrium: Secondary | ICD-10-CM

## 2019-11-08 DIAGNOSIS — R0602 Shortness of breath: Secondary | ICD-10-CM | POA: Diagnosis not present

## 2019-11-09 ENCOUNTER — Ambulatory Visit (INDEPENDENT_AMBULATORY_CARE_PROVIDER_SITE_OTHER): Payer: PPO | Admitting: Obstetrics and Gynecology

## 2019-11-09 ENCOUNTER — Other Ambulatory Visit: Payer: PPO

## 2019-11-09 ENCOUNTER — Encounter: Payer: Self-pay | Admitting: Obstetrics and Gynecology

## 2019-11-09 VITALS — BP 138/76 | HR 83 | Ht 67.0 in

## 2019-11-09 DIAGNOSIS — Z30430 Encounter for insertion of intrauterine contraceptive device: Secondary | ICD-10-CM

## 2019-11-09 DIAGNOSIS — C541 Malignant neoplasm of endometrium: Secondary | ICD-10-CM

## 2019-11-09 DIAGNOSIS — R300 Dysuria: Secondary | ICD-10-CM

## 2019-11-09 NOTE — Patient Instructions (Addendum)
IUD PLACEMENT POST-PROCEDURE INSTRUCTIONS  1. You may take Ibuprofen, Aleve or Tylenol for pain if needed.  Cramping should resolve within in 24 hours.  2. You may have a small amount of spotting.  You should wear a mini pad for the next few days.  3. You may have intercourse after 24 hours.  If you using this for birth control, it is effective immediately.  4. You need to call if you have any pelvic pain, fever, heavy bleeding or foul smelling vaginal discharge.  Irregular bleeding is common the first several months after having an IUD placed. You do not need to call for this reason unless you are concerned.  5. Shower or bathe as normal  6. You should have a follow-up appointment in 4-8 weeks for a re-check to make sure you are not having any problems.  Uterine Cancer  Uterine cancer is an abnormal growth of cancer tissue (malignant tumor) in the uterus. Unlike noncancerous (benign) tumors, malignant tumors can spread to other parts of the body. Uterine cancer usually occurs after menopause. However, it may also occur around the time that menopause begins. The wall of the uterus has an inner layer of tissue (endometrium) and an outer layer of muscle tissue (myometrium). The most common type of uterine cancer begins in the endometrium (endometrial cancer). Cancer that begins in the myometrium (uterine sarcoma) is very rare. What are the causes? The exact cause of this condition is not known. What increases the risk? You are more likely to develop this condition if you:  Are older than 50.  Have an enlarged endometrium (endometrial hyperplasia).  Use hormone therapy.  Are severely overweight (obese).  Use the medicine tamoxifen.  You are white (Caucasian).  Cannot bear children (are infertile).  Have never been pregnant.  Started menstruating at an age younger than 12 years.  Are older than 52 and are still having menstrual periods.  Have a history of cancer of the ovaries,  intestines, or colon or rectum (colorectal cancer).  Have a history of enlarged ovaries with small cysts (polycystic ovarian syndrome).  Have a family history of: ? Uterine cancer. ? Hereditary nonpolyposis colon cancer (HNPCC).  Have diabetes, high blood pressure, thyroid disease, or gallbladder disease.  Use long-term, high-dose birth control pills.  Have been exposed to radiation.  Smoke. What are the signs or symptoms? Symptoms of this condition include:  Abnormal vaginal bleeding or discharge. Bleeding may start as a watery, blood-streaked flow that gradually contains more blood. This is the most common symptom. If you experience abnormal vaginal bleeding, do not assume that it is part of menopause.  Vaginal bleeding after menopause.  Unexplained weight loss.  Bleeding between periods.  Urination that is difficult, painful, or more frequent than usual.  A lump (mass) in the vagina.  Pain, bloating, or fullness in the abdomen.  Pain in the pelvic area.  Pain during sex. How is this diagnosed? This condition may be diagnosed based on:  Your medical history and your symptoms.  A physical and pelvic exam. Your health care provider will feel your pelvis for any growths or enlarged lymph nodes.  Blood and urine tests.  Imaging tests, such as X-rays, CT scans, ultrasound, or MRIs.  A procedure in which a thin, flexible tube with a light and camera on the end is inserted through the vagina and used to look inside the uterus (hysteroscopy).  A Pap test to check for abnormal cells in the lower part of the uterus (  cervix) and the upper vagina.  Removing a tissue sample (biopsy) from the uterine lining to check for cancer cells.  Dilation and curettage (D&C). This is a procedure that involves stretching (dilation) the cervix and scraping (curettage) the inside lining of the uterus to get a biopsy and check for cancer cells. Your cancer will be staged to determine its  severity and extent. Staging is an assessment of:  The size of the tumor.  Whether the cancer has spread.  Where the cancer has spread. The stages of uterine cancer are as follows:  Stage I. The cancer is only found in the uterus.  Stage II. The cancer has spread to the cervix.  Stage III. The cancer has spread outside the uterus, but not outside the pelvis. The cancer may have spread to the lymph nodes in the pelvis. Lymph nodes are part of your body's disease-fighting (immune) system. Lymph nodes are found in many locations in your body, including the neck, underarm, and groin.  Stage IV. The cancer has spread to other parts of the body, such as the bladder or rectum. How is this treated? This condition is often treated with surgery to remove:  The uterus, cervix, fallopian tubes, and ovaries (total hysterectomy).  The uterus and cervix (simple hysterectomy). The type of hysterectomy you will have depends on the extent of your cancer. Lymph nodes near the uterus may also be removed in some cases. Treatment may also include one or more of the following:  Chemotherapy. This uses medicines to kill the cancer cells and prevent their spread.  Radiation therapy. This uses high-energy rays to kill the cancer cells and prevent the spread of cancer.  Chemoradiation. This is a combination treatment that alternates chemotherapy with radiation treatments to enhance the way radiation works.  Brachytherapy. This involves placing radioactive materials inside the body where the cancer was removed.  Hormone therapy. This includes taking medicines that lower the levels of estrogen in the body. Follow these instructions at home: Activity  Return to your normal activities as told by your health care provider. Ask your health care provider what activities are safe for you.  Exercise regularly as told by your health care provider.  Do not drive or use heavy machinery while taking prescription  pain medicine. General instructions  Take over-the-counter and prescription medicines only as told by your health care provider.  Maintain a healthy diet.  Work with your health care provider to: ? Manage any long-term (chronic) conditions you have, such as diabetes, high blood pressure, thyroid disease, or gallbladder disease. ? Manage any side effects of your treatment.  Do not use any products that contain nicotine or tobacco, such as cigarettes and e-cigarettes. If you need help quitting, ask your health care provider.  Consider joining a support group to help you cope with stress. Your health care provider may be able to recommend a local or online support group.  Keep all follow-up visits as told by your health care provider. This is important. Where to find more information  American Cancer Society: https://www.cancer.[place] ([place]): https://www.cancer.gov Contact a health care provider if:  You have pain in your pelvis or abdomen that gets worse.  You cannot urinate.  You have abnormal bleeding.  You have a fever. Get help right away if:  You develop sudden or new severe symptoms, such as: ? Heavy bleeding. ? Severe weakness. ? Pain that is severe or does not get better with medicine. Summary  Uterine cancer  is an abnormal growth of cancer tissue (malignant tumor) in the uterus. The most common type of uterine cancer begins in the endometrium (endometrial cancer).  This condition is often treated with surgery to remove the uterus, cervix, fallopian tubes, and ovaries (total hysterectomy) or the uterus and cervix (simple hysterectomy).  Work with your health care provider to manage any long-term (chronic) conditions you have, such as diabetes, high blood pressure, thyroid disease, or gallbladder disease.  Consider joining a support group to help you cope with stress. Your health care provider may be able to recommend a local or online support  group. This information is not intended to replace advice given to you by your health care provider. Make sure you discuss any questions you have with your health care provider. Document Revised: 05/29/2017 Document Reviewed: 06/13/2016 Elsevier Patient Education  [place].     Hysterectomy Information  A hysterectomy is a surgery in which the uterus is removed. The fallopian tubes and ovaries may be removed (bilateral salpingo-oophorectomy) as well. This procedure may be done to treat various medical problems. After the procedure, a woman will no longer have menstrual periods nor will she be able to become pregnant (sterile). What are the reasons for a hysterectomy? There are many reasons why a woman might have this procedure. They include:  Persistent, abnormal vaginal bleeding.  Long-term (chronic) pelvic pain or infection.  Endometriosis. This is when the lining of the uterus (endometrium) starts to grow outside the uterus.  Adenomyosis. This is when the endometrium starts to grow in the muscle of the uterus.  Pelvic organ prolapse. This is a condition in which the uterus falls down into the vagina.  Noncancerous growths in the uterus (uterine fibroids) that cause symptoms.  The presence of precancerous cells.  Cervical or uterine cancer. What are the different types of hysterectomy? There are three different types of hysterectomy:  Supracervical hysterectomy. In this type, the top part of the uterus is removed, but not the cervix.  Total hysterectomy. In this type, the uterus and cervix are removed.  Radical hysterectomy. In this type, the uterus, the cervix, and the tissue that holds the uterus in place (parametrium) are removed. What are the different ways a hysterectomy can be performed? There are many different ways a hysterectomy can be performed, including:  Abdominal hysterectomy. In this type, an incision is made in the abdomen. The uterus is removed  through this incision.  Vaginal hysterectomy. In this type, an incision is made in the vagina. The uterus is removed through this incision. There are no abdominal incisions.  Conventional laparoscopic hysterectomy. In this type, three or four small incisions are made in the abdomen. A thin, lighted tube with a camera (laparoscope) is inserted into one of the incisions. Other tools are put through the other incisions. The uterus is cut into small pieces. The small pieces are removed through the incisions or through the vagina.  Laparoscopically assisted vaginal hysterectomy (LAVH). In this type, three or four small incisions are made in the abdomen. Part of the surgery is performed laparoscopically and the other part is done vaginally. The uterus is removed through the vagina.  Robot-assisted laparoscopic hysterectomy. In this type, a laparoscope and other tools are inserted into three or four small incisions in the abdomen. A computer-controlled device is used to give the surgeon a 3D image and to help control the surgical instruments. This allows for more precise movements of surgical instruments. The uterus is cut into small  pieces and removed through the incisions or removed through the vagina. Discuss the options with your health care provider to determine which type is the right one for you. What are the risks? Generally, this is a safe procedure. However, problems may occur, including:  Bleeding and risk of blood transfusion. Tell your health care provider if you do not want to receive any blood products.  Blood clots in the legs or lung.  Infection.  Damage to other structures or organs.  Allergic reactions to medicines.  Changing to an abdominal hysterectomy from one of the other techniques. What to expect after a hysterectomy  You will be given pain medicine.  You may need to stay in the hospital for 1- 2 days to recover, depending on the type of hysterectomy you had.  Follow  your health care provider's instructions about exercise, driving, and general activities. Ask your health care provider what activities are safe for you.  You will need to have someone with you for the first 3-5 days after you go home.  You will need to follow up with your surgeon in 2-4 weeks after surgery to evaluate your progress.  If the ovaries are removed, you will have early menopause symptoms such as hot flashes, night sweats, and insomnia.  If you had a hysterectomy for a problem that was not cancer or not a condition that could lead to cancer, then you no longer need Pap tests. However, even if you no longer need a Pap test, a regular pelvic exam is a good idea to make sure no other problems are developing. Questions to ask your health care provider  Is a hysterectomy medically necessary? Do I have other treatment options for my condition?  What are my options for hysterectomy procedure?  What organs and tissues need to be removed?  What are the risks?  What are the benefits?  How long will I need to stay in the hospital after the procedure?  How long will I need to recover at home?  What symptoms can I expect after the procedure? Summary  A hysterectomy is a surgery in which the uterus is removed. The fallopian tubes and ovaries may be removed (bilateral salpingo-oophorectomy) as well.  This procedure may be done to treat various medical problems. After the procedure, a woman will no longer have menstrual periods nor will she be able to become pregnant.  Discuss the options with your health care provider to determine which type of hysterectomy is the right one for you. This information is not intended to replace advice given to you by your health care provider. Make sure you discuss any questions you have with your health care provider. Document Revised: 05/29/2017 Document Reviewed: 07/23/2016 Elsevier Patient Education  2020 [employer].

## 2019-11-09 NOTE — Progress Notes (Signed)
Patient ID: Whitney Simmons, female   DOB: 08/14/1927, 86 y.o.   MRN: [id number]  Reason for Consult: Follow-up (post op )   Referred by Leone Haven, MD  Subjective:     HPI:  Whitney Simmons is a 86 y.o. female she is following up today after her appointment with gynecological oncology.  They discussed options for surgical versus medical options for management.  Thank he has been debating this decision is still somewhat uncertain about what she desires.  Her daughter Whitney Simmons is with her today in the office.  She reports that she has been having minimal bleeding just sometimes small amounts of spotting.  She is happy with this amount of bleeding.   Past Medical History:  Diagnosis Date  . Breast microcalcification, mammographic 01/31/2014  . 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 Declined postoperative radiation and antiestrogen therapy.  . Edema   . Hyperlipidemia   . Hypertension   . Hypothyroidism   . IBS (irritable bowel syndrome)   . Irregular heart beat   . stage III kidney disease   . Thyroid disease    s/p radioactive iodine ablation 30 years ago   Family History  Problem Relation Age of Onset  . Heart disease Mother   . Heart disease Father   . Heart attack Father   . Heart disease Maternal Aunt   . Heart disease Brother   . AAA (abdominal aortic aneurysm) Other   . Stroke Maternal Aunt   . Breast cancer Neg Hx    Past Surgical History:  Procedure Laterality Date  . BREAST LUMPECTOMY Left 2015   DUCTAL CARCINOMA IN SITU WITH CALCIFICATIONS  . BREAST SURGERY Left 02-15-14   wide excision  . CATARACT EXTRACTION    . CHOLECYSTECTOMY    . HYSTEROSCOPY WITH D & C N/A 10/18/2019   Procedure: DILATATION AND CURETTAGE /HYSTEROSCOPY;  Surgeon: Homero Fellers, MD;  Location: [facility] ORS;  Service: Gynecology;  Laterality: N/A;  . TOE AMPUTATION     left 5th toe    Short Social History:  Social History   Tobacco Use   . Smoking status: Never Smoker  . Smokeless tobacco: Never Used  Substance Use Topics  . Alcohol use: No    Allergies  Allergen Reactions  . Levaquin [Levofloxacin In D5w] Other (See Comments)    hallucinations  . Sulfa Antibiotics     Hallucinations     Current Outpatient Medications  Medication Sig Dispense Refill  . furosemide (LASIX) 20 MG tablet Take 1 tablet (20 mg total) by mouth daily. 90 tablet 2  . latanoprost (XALATAN) 0.005 % ophthalmic solution Place 1 drop into both eyes at bedtime.     Marland Kitchen levothyroxine (EUTHYROX) 88 MCG tablet TAKE 1 TABLET BY MOUTH ONCE DAILY BEFORE BREAKFAST 90 tablet 3  . losartan (COZAAR) 50 MG tablet Take 1 tablet (50 mg total) by mouth daily. 90 tablet 2  . Multiple Vitamin (MULTIVITAMIN) tablet Take 1 tablet by mouth daily.    . nebivolol (BYSTOLIC) 5 MG tablet Take 1 tablet (5 mg total) by mouth daily. 90 tablet 2   No current facility-administered medications for this visit.    Review of Systems  Constitutional: Negative for chills, fatigue, fever and unexpected weight change.  HENT: Negative for trouble swallowing.  Eyes: Negative for loss of vision.  Respiratory: Negative for cough, shortness of breath and wheezing.  Cardiovascular: Negative for chest pain, leg swelling,  palpitations and syncope.  GI: Negative for abdominal pain, blood in stool, diarrhea, nausea and vomiting.  GU: Negative for difficulty urinating, dysuria, frequency and hematuria.  Musculoskeletal: Negative for back pain, leg pain and joint pain.  Skin: Negative for rash.  Neurological: Negative for dizziness, headaches, light-headedness, numbness and seizures.  Psychiatric: Negative for behavioral problem, confusion, depressed mood and sleep disturbance.        Objective:  Objective   Vitals:   11/09/19 1524  BP: 138/76  Pulse: 83  Height: 5\' 7"  (1.702 m)   Body mass index is 27.19 kg/m.  Physical Exam Vitals and nursing note reviewed.   Constitutional:      Appearance: She is well-developed.  HENT:     Head: Normocephalic and atraumatic.  Eyes:     Pupils: Pupils are equal, round, and reactive to light.  Cardiovascular:     Rate and Rhythm: Normal rate and regular rhythm.  Pulmonary:     Effort: Pulmonary effort is normal. No respiratory distress.  Abdominal:     General: Abdomen is flat.     Palpations: Abdomen is soft.  Genitourinary:    Comments: External: Normal appearing vulva. No lesions noted.  Speculum examination: Normal appearing cervix. No blood in the vaginal vault.  Skin:    General: Skin is warm and dry.  Neurological:     Mental Status: She is alert and oriented to person, place, and time.  Psychiatric:        Behavior: Behavior normal.        Thought Content: Thought content normal.        Judgment: Judgment normal.    IUD PROCEDURE NOTE:  LAMONDA YAMAMURA is a 86 y.o. G2P0 here for IUD insertion. No GYN concerns.  Last pap smear was normal.  IUD Insertion Procedure Note Patient identified, informed consent performed, consent signed.   Discussed risks of irregular bleeding, cramping, infection, malpositioning or misplacement of the IUD outside the uterus which may require further procedure such as laparoscopy, risk of failure <1%. Time out was performed.  Urine pregnancy test negative.  A bimanual exam showed the uterus to be anteverted.  Speculum placed in the vagina.  Cervix visualized.  Cleaned with Betadine x 2.  Grasped anteriorly with a single tooth tenaculum.  Uterus sounded to 8 cm.   Mirena IUD placed per manufacturer's recommendations.  Strings trimmed to 3 cm. Tenaculum was removed, good hemostasis noted.  Patient tolerated procedure well.   Patient was given post-procedure instructions.  She was advised to have backup contraception for one week.  Patient was also asked to check IUD strings periodically and follow up in 4 weeks for IUD check.      Assessment/Plan:     86 year old  with grade 1 endometrial cancer Long discussion with the patient today regarding the risk benefits of surgery versus medical management.  After this discussion was held weighing the benefits and risks of surgery versus medical management with a Mirena IUD and possible progression of endometrial cancer with medical management the patient elected for placement of the IUD.  She is plans to follow-up with Dr. Theora Gianotti in several months for biopsy of the endometrium.  She she will continue to consider her options and may elect to have surgery in the future.  IUD was placed successfully into the endometrial cavity today patient will follow up in 4 weeks for a IUD string check.  Urine culture sent and check for urinary tract infection.  More than 45  minutes were spent face to face with the patient in the room, reviewing the medical record, labs and images, and coordinating care for the patient. The plan of management was discussed in detail and counseling was provided.    Adrian Prows MD [facility], [facility] she 4:28 PM

## 2019-11-10 DIAGNOSIS — R300 Dysuria: Secondary | ICD-10-CM | POA: Diagnosis not present

## 2019-11-12 ENCOUNTER — Emergency Department: Payer: PPO

## 2019-11-12 ENCOUNTER — Inpatient Hospital Stay
Admission: EM | Admit: 2019-11-12 | Discharge: 2019-11-18 | DRG: 176 | Disposition: A | Discharge status: Home / Self Care | Payer: PPO | Attending: Internal Medicine | Admitting: Internal Medicine

## 2019-11-12 ENCOUNTER — Other Ambulatory Visit: Payer: Self-pay

## 2019-11-12 DIAGNOSIS — Z79899 Other long term (current) drug therapy: Secondary | ICD-10-CM | POA: Diagnosis not present

## 2019-11-12 DIAGNOSIS — C541 Malignant neoplasm of endometrium: Secondary | ICD-10-CM | POA: Diagnosis present

## 2019-11-12 DIAGNOSIS — Z823 Family history of stroke: Secondary | ICD-10-CM | POA: Diagnosis not present

## 2019-11-12 DIAGNOSIS — Z79890 Hormone replacement therapy (postmenopausal): Secondary | ICD-10-CM | POA: Diagnosis not present

## 2019-11-12 DIAGNOSIS — N1832 Chronic kidney disease, stage 3b: Secondary | ICD-10-CM | POA: Diagnosis not present

## 2019-11-12 DIAGNOSIS — Z86711 Personal history of pulmonary embolism: Secondary | ICD-10-CM

## 2019-11-12 DIAGNOSIS — Z20822 Contact with and (suspected) exposure to covid-19: Secondary | ICD-10-CM | POA: Diagnosis not present

## 2019-11-12 DIAGNOSIS — Z8249 Family history of ischemic heart disease and other diseases of the circulatory system: Secondary | ICD-10-CM | POA: Diagnosis not present

## 2019-11-12 DIAGNOSIS — I8001 Phlebitis and thrombophlebitis of superficial vessels of right lower extremity: Secondary | ICD-10-CM

## 2019-11-12 DIAGNOSIS — Z882 Allergy status to sulfonamides status: Secondary | ICD-10-CM

## 2019-11-12 DIAGNOSIS — I10 Essential (primary) hypertension: Secondary | ICD-10-CM | POA: Diagnosis present

## 2019-11-12 DIAGNOSIS — Z89422 Acquired absence of other left toe(s): Secondary | ICD-10-CM

## 2019-11-12 DIAGNOSIS — M79604 Pain in right leg: Secondary | ICD-10-CM

## 2019-11-12 DIAGNOSIS — I2699 Other pulmonary embolism without acute cor pulmonale: Secondary | ICD-10-CM | POA: Diagnosis not present

## 2019-11-12 DIAGNOSIS — I2694 Multiple subsegmental pulmonary emboli without acute cor pulmonale: Principal | ICD-10-CM | POA: Diagnosis present

## 2019-11-12 DIAGNOSIS — E039 Hypothyroidism, unspecified: Secondary | ICD-10-CM | POA: Diagnosis not present

## 2019-11-12 DIAGNOSIS — I129 Hypertensive chronic kidney disease with stage 1 through stage 4 chronic kidney disease, or unspecified chronic kidney disease: Secondary | ICD-10-CM | POA: Diagnosis not present

## 2019-11-12 DIAGNOSIS — Z881 Allergy status to other antibiotic agents status: Secondary | ICD-10-CM

## 2019-11-12 DIAGNOSIS — R0602 Shortness of breath: Secondary | ICD-10-CM | POA: Diagnosis not present

## 2019-11-12 DIAGNOSIS — Z853 Personal history of malignant neoplasm of breast: Secondary | ICD-10-CM

## 2019-11-12 DIAGNOSIS — Z975 Presence of (intrauterine) contraceptive device: Secondary | ICD-10-CM

## 2019-11-12 DIAGNOSIS — N183 Chronic kidney disease, stage 3 unspecified: Secondary | ICD-10-CM | POA: Diagnosis present

## 2019-11-12 DIAGNOSIS — E890 Postprocedural hypothyroidism: Secondary | ICD-10-CM | POA: Diagnosis present

## 2019-11-12 DIAGNOSIS — Z9049 Acquired absence of other specified parts of digestive tract: Secondary | ICD-10-CM | POA: Diagnosis not present

## 2019-11-12 DIAGNOSIS — M7989 Other specified soft tissue disorders: Secondary | ICD-10-CM | POA: Diagnosis not present

## 2019-11-12 DIAGNOSIS — I82812 Embolism and thrombosis of superficial veins of left lower extremities: Secondary | ICD-10-CM | POA: Diagnosis not present

## 2019-11-12 DIAGNOSIS — N1831 Chronic kidney disease, stage 3a: Secondary | ICD-10-CM | POA: Diagnosis not present

## 2019-11-12 DIAGNOSIS — I82811 Embolism and thrombosis of superficial veins of right lower extremities: Secondary | ICD-10-CM | POA: Diagnosis not present

## 2019-11-12 LAB — COMPREHENSIVE METABOLIC PANEL
ALT: 11 U/L (ref 0–44)
AST: 12 U/L — ABNORMAL LOW (ref 15–41)
Albumin: 3.8 g/dL (ref 3.5–5.0)
Alkaline Phosphatase: 94 U/L (ref 38–126)
Anion gap: 9 (ref 5–15)
BUN: 26 mg/dL — ABNORMAL HIGH (ref 8–23)
CO2: 26 mmol/L (ref 22–32)
Calcium: 8.6 mg/dL — ABNORMAL LOW (ref 8.9–10.3)
Chloride: 104 mmol/L (ref 98–111)
Creatinine, Ser: 1.4 mg/dL — ABNORMAL HIGH (ref 0.44–1.00)
GFR calc Af Amer: 39 mL/min — ABNORMAL LOW (ref >60)
GFR calc non Af Amer: 33 mL/min — ABNORMAL LOW (ref >60)
Glucose, Bld: 110 mg/dL — ABNORMAL HIGH (ref 70–99)
Potassium: 4.5 mmol/L (ref 3.5–5.1)
Sodium: 139 mmol/L (ref 135–145)
Total Bilirubin: 0.7 mg/dL (ref 0.3–1.2)
Total Protein: 7.6 g/dL (ref 6.5–8.1)

## 2019-11-12 LAB — CBC
HCT: 40.6 % (ref 36.0–46.0)
Hemoglobin: 13.4 g/dL (ref 12.0–15.0)
MCH: 29.5 pg (ref 26.0–34.0)
MCHC: 33 g/dL (ref 30.0–36.0)
MCV: 89.2 fL (ref 80.0–100.0)
Platelets: 283 10*3/uL (ref 150–400)
RBC: 4.55 MIL/uL (ref 3.87–5.11)
RDW: 13.2 % (ref 11.5–15.5)
WBC: 10.8 10*3/uL — ABNORMAL HIGH (ref 4.0–10.5)
nRBC: 0 % (ref 0.0–0.2)

## 2019-11-12 LAB — URINE CULTURE: Organism ID, Bacteria: NO GROWTH

## 2019-11-12 NOTE — ED Triage Notes (Signed)
Patient c/o right leg swelling/pain beginning this am. Patient just had IUD placed for endometrial cancer.

## 2019-11-12 NOTE — ED Provider Notes (Signed)
[facility] Emergency Department Provider Note  ____________________________________________   First MD Initiated Contact with Patient 11/12/19 2324     (approximate)  I have reviewed the triage vital signs and the nursing notes.   HISTORY  Chief Complaint Leg Swelling    HPI Whitney Simmons is a 86 y.o. female with below list of previous medical conditions including endometrial cancer for which patient recently received an IUD on Wednesday presented to the emergency department secondary to cute onset of right leg pain and swelling and area of redness noted with onset this morning.  Patient also admits to chronic history of dyspnea however states that it has worsened over the course of today.  Patient denies any chest pain.        Past Medical History:  Diagnosis Date  . Breast microcalcification, mammographic 01/31/2014  . 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 Declined postoperative radiation and antiestrogen therapy.  . Edema   . Hyperlipidemia   . Hypertension   . Hypothyroidism   . IBS (irritable bowel syndrome)   . Irregular heart beat   . stage III kidney disease   . Thyroid disease    s/p radioactive iodine ablation 30 years ago    Patient Active Problem List   Diagnosis Date Noted  . Superficial thrombophlebitis of right leg 11/13/2019  . Pulmonary embolism ([place]) 11/13/2019  . IUD (intrauterine device) in place 11/13/2019  . Acute pulmonary embolism ([place]) 11/13/2019  . Endometrial cancer, grade I ([place]) 10/25/2019  . Postmenopausal bleeding   . Preoperative exam for gynecologic surgery 10/05/2019  . Hallucination, visual 10/05/2019  . Vaginal discharge 06/30/2019  . Difficulty hearing 12/06/2018  . Bruising 05/31/2018  . Gait instability 09/22/2017  . Nevus 05/29/2017  . Callus of foot 01/05/2017  . Thrombocytopenia ([place]) 11/06/2015  . Osteoporosis 03/07/2015  . Postmenopausal estrogen  deficiency 01/23/2015  . Glaucoma 05/12/2014  . History of breast cancer 02/13/2014  . IBS (irritable bowel syndrome) 12/09/2013  . Palpitations 09/01/2012  . Chronic kidney disease, stage III (moderate) 08/18/2012  . Anxiety 08/02/2012  . Hypothyroidism 07/30/2012  . Hyperlipidemia 07/30/2012  . Hypertension 07/30/2012    Past Surgical History:  Procedure Laterality Date  . BREAST LUMPECTOMY Left 2015   DUCTAL CARCINOMA IN SITU WITH CALCIFICATIONS  . BREAST SURGERY Left 02-15-14   wide excision  . CATARACT EXTRACTION    . CHOLECYSTECTOMY    . HYSTEROSCOPY WITH D & C N/A 10/18/2019   Procedure: DILATATION AND CURETTAGE /HYSTEROSCOPY;  Surgeon: Homero Fellers, MD;  Location: [facility] ORS;  Service: Gynecology;  Laterality: N/A;  . TOE AMPUTATION     left 5th toe    Prior to Admission medications   Medication Sig Start Date End Date Taking? Authorizing Provider  aspirin EC 81 MG tablet Take 81 mg by mouth daily.   Yes [provider]  furosemide (LASIX) 20 MG tablet Take 1 tablet (20 mg total) by mouth daily. 09/26/19 06/22/20 Yes Leone Haven, MD  latanoprost (XALATAN) 0.005 % ophthalmic solution Place 1 drop into both eyes at bedtime.  01/31/19  Yes [provider]  levothyroxine (EUTHYROX) 88 MCG tablet TAKE 1 TABLET BY MOUTH ONCE DAILY BEFORE BREAKFAST 10/05/19  Yes Leone Haven, MD  losartan (COZAAR) 50 MG tablet Take 1 tablet (50 mg total) by mouth daily. 09/26/19  Yes Leone Haven, MD  Multiple Vitamin (MULTIVITAMIN) tablet Take 1 tablet by mouth daily.  Yes [provider]  nebivolol (BYSTOLIC) 5 MG tablet Take 1 tablet (5 mg total) by mouth daily. 10/06/19  Yes Leone Haven, MD    Allergies Levaquin [levofloxacin in d5w] and Sulfa antibiotics  Family History  Problem Relation Age of Onset  . Heart disease Mother   . Heart disease Father   . Heart attack Father   . Heart disease Maternal Aunt   . Heart disease Brother     . AAA (abdominal aortic aneurysm) Other   . Stroke Maternal Aunt   . Breast cancer Neg Hx     Social History Social History   Tobacco Use  . Smoking status: Never Smoker  . Smokeless tobacco: Never Used  Substance Use Topics  . Alcohol use: No  . Drug use: No    Review of Systems Constitutional: No fever/chills Eyes: No visual changes. ENT: No sore throat. Cardiovascular: Denies chest pain. Respiratory: Positive for shortness of breath. Gastrointestinal: No abdominal pain.  No nausea, no vomiting.  No diarrhea.  No constipation. Genitourinary: Negative for dysuria. Musculoskeletal: Negative for neck pain.  Negative for back pain.  Positive for right leg pain swelling and redness Integumentary: Negative for rash. Neurological: Negative for headaches, focal weakness or numbness.   ____________________________________________   PHYSICAL EXAM:  VITAL SIGNS: ED Triage Vitals  Enc Vitals Group     BP 11/12/19 2158 (!) 170/59     Pulse Rate 11/12/19 2158 (!) 112     Resp 11/12/19 2158 19     Temp 11/12/19 2158 97.8 F (36.6 C)     Temp src --      SpO2 11/12/19 2158 97 %     Weight 11/12/19 2155 78.5 kg (173 lb)     Height 11/12/19 2155 1.702 m (5\' 7" )     Head Circumference --      Peak Flow --      Pain Score 11/12/19 2154 5     Pain Loc --      Pain Edu? --      Excl. in [place]? --     Constitutional: Alert and oriented.  Eyes: Conjunctivae are normal.  Head: Atraumatic. Nose: No congestion/rhinnorhea. Mouth/Throat: Patient is wearing a mask. Neck: No stridor.  No meningeal signs.   Cardiovascular: Normal rate, regular rhythm. Good peripheral circulation. Grossly normal heart sounds. Respiratory: Normal respiratory effort.  No retractions. Gastrointestinal: Soft and nontender. No distention.   Musculoskeletal: No lower extremity tenderness nor edema. No gross deformities of extremities. Neurologic:  Normal speech and language. No gross focal neurologic  deficits are appreciated.  Skin:  Skin is warm, dry and intact.  7 x 6cm circular area of erythema medial aspect right leg just below the knee Psychiatric: Mood and affect are normal. Speech and behavior are normal.  ____________________________________________   LABS (all labs ordered are listed, but only abnormal results are displayed)  Labs Reviewed  CBC - Abnormal; Notable for the following components:      Result Value   WBC 10.8 (*)    All other components within normal limits  COMPREHENSIVE METABOLIC PANEL - Abnormal; Notable for the following components:   Glucose, Bld 110 (*)    BUN 26 (*)    Creatinine, Ser 1.40 (*)    Calcium 8.6 (*)    AST 12 (*)    GFR calc non Af Amer 33 (*)    GFR calc Af Amer 39 (*)    All other components within normal limits  SARS CORONAVIRUS 2 BY RT PCR (HOSPITAL ORDER, [facility])  APTT  PROTIME-INR  HEPARIN LEVEL (UNFRACTIONATED)   ____________________________________________  EKG  ED ECG REPORT I, Blakely N Layth Cerezo, the attending physician, personally viewed and interpreted this ECG.   Date: 11/13/2019  EKG Time: 4:32 AM  Rate: 78  Rhythm: Normal sinus rhythm  Axis: Normal  Intervals: Normal  ST&T Change: None  ____________________________________________  RADIOLOGY I, Kirby N Keyasia Jolliff, personally viewed and evaluated these images (plain radiographs) as part of my medical decision making, as well as reviewing the written report by the radiologist.  ED MD interpretation: Right lower extremity ultrasound revealed: Negative for acute DVT nonocclusive thrombus/superficial thrombophlebitis of the greater saphenous vein below the knee   CT chest revealed: Partial occlusive segmental and subsegmental bilateral pulmonary emboli   Official radiology report(s): CT Angio Chest PE W and/or Wo Contrast  Result Date: 11/13/2019 CLINICAL DATA:  Shortness of breath right leg pain IUD placed for endometrial  cancer EXAM: CT ANGIOGRAPHY CHEST WITH CONTRAST TECHNIQUE: Multidetector CT imaging of the chest was performed using the standard protocol during bolus administration of intravenous contrast. Multiplanar CT image reconstructions and MIPs were obtained to evaluate the vascular anatomy. CONTRAST:  25mL OMNIPAQUE IOHEXOL 350 MG/ML SOLN COMPARISON:  Nov 08, 2019 FINDINGS: Cardiovascular: There is a optimal opacification of the pulmonary arteries. There is partially occlusive thrombus seen at the bifurcation of the right middle lobe and right lower lobe segmental and subsegmental pulmonary arterial branches. There is partially occlusive thrombus in the posterior right lower lobe subsegmental pulmonary arteries. There is partially occlusive thrombus at the bifurcation of the left segmental upper lobe and lower lobe pulmonary arterial branches which extend into the subsegmental branches. No large central pulmonary embolism is seen. There is moderate cardiomegaly. No evidence of right ventricular heart strain. There is normal three-vessel brachiocephalic anatomy without proximal stenosis. The thoracic aorta is normal in appearance. Scattered aortic atherosclerosis. Mediastinum/Nodes: No hilar, mediastinal, or axillary adenopathy. Thyroid gland, trachea, and esophagus demonstrate no significant findings. Lungs/Pleura: The lungs are clear. No pleural effusion or pneumothorax. No airspace consolidation. Upper Abdomen: No acute abnormalities present in the visualized portions of the upper abdomen. Musculoskeletal: No chest wall abnormality. No acute or significant osseous findings. Review of the MIP images confirms the above findings. IMPRESSION: Partially occlusive segmental and subsegmental bilateral pulmonary emboli as described above within the right middle lobe, right lower lobe, left upper lobe and left lower lobes. No evidence of right ventricular heart strain. Aortic Atherosclerosis (ICD10-I70.0). These results were  called by telephone at the time of interpretation on 11/13/2019 at 2:12 am to provider [facility] , who verbally acknowledged these results. Electronically Signed   By: Prudencio Pair M.D.   On: 11/13/2019 02:13   US Venous Img Lower Unilateral Right (DVT)  Result Date: 11/12/2019 CLINICAL DATA:  Right leg swelling and pain EXAM: Right LOWER EXTREMITY VENOUS DOPPLER ULTRASOUND TECHNIQUE: Gray-scale sonography with compression, as well as color and duplex ultrasound, were performed to evaluate the deep venous system(s) from the level of the common femoral vein through the popliteal and proximal calf veins. COMPARISON:  None. FINDINGS: VENOUS Normal compressibility of the common femoral, superficial femoral, and popliteal veins, as well as the visualized calf veins. Visualized portions of profunda femoral vein are unremarkable. Nonocclusive thrombus within the greater saphenous vein below the knee. Vein is noncompressible. No filling defects to suggest DVT on grayscale or color Doppler imaging. Doppler waveforms show normal  direction of venous flow, normal respiratory plasticity and response to augmentation. Limited views of the contralateral common femoral vein are unremarkable. OTHER None. Limitations: none IMPRESSION: 1. Negative for acute deep venous thrombosis of the right lower extremity 2. Nonocclusive thrombus/superficial thrombophlebitis of the greater saphenous vein below the level of the knee. Electronically Signed   By: Donavan Foil M.D.   On: 11/12/2019 23:14       Procedures   ____________________________________________   INITIAL IMPRESSION / MDM / ASSESSMENT AND PLAN / ED COURSE  As part of my medical decision making, I reviewed the following data within the [place] NUMBER  86 year old female presented with above-stated history and physical exam a differential diagnosis including but not limited to DVT, superficial thrombus, pulmonary emboli.  Ultrasound revealed a  superficial vein thrombus in the greater saphenous vein below the knee with associated thrombophlebitis.  CT chest did reveal bilateral pulmonary emboli without any evidence of heart strain.  Heparin was initiated given patient's renal function.  Patient discussed with Dr. Damita Dunnings for hospital admission further evaluation and management.  ____________________________________________  FINAL CLINICAL IMPRESSION(S) / ED DIAGNOSES  Final diagnoses:  Bilateral pulmonary embolism ([place])  Venous embolism and thrombosis of superficial vessels of left lower extremity     MEDICATIONS GIVEN DURING THIS VISIT:  Medications  heparin bolus via infusion 5,000 Units (5,000 Units Intravenous Bolus from Bag 11/13/19 0247)    Followed by  heparin ADULT infusion 100 units/mL (25000 units/264mL sodium chloride 0.45%) (1,250 Units/hr Intravenous New Bag/Given 11/13/19 0250)  acetaminophen (TYLENOL) tablet 650 mg (has no administration in time range)    Or  acetaminophen (TYLENOL) suppository 650 mg (has no administration in time range)  HYDROcodone-acetaminophen (NORCO/VICODIN) 5-325 MG per tablet 1-2 tablet (has no administration in time range)  ondansetron (ZOFRAN) tablet 4 mg (has no administration in time range)    Or  ondansetron (ZOFRAN) injection 4 mg (has no administration in time range)  sodium chloride 0.9 % bolus 500 mL (0 mLs Intravenous Stopped 11/13/19 0251)  iohexol (OMNIPAQUE) 350 MG/ML injection 60 mL (75 mLs Intravenous Contrast Given 11/13/19 0116)     ED Discharge Orders    None      *Please note:  NATASSIA HUMES was evaluated in Emergency Department on 11/13/2019 for the symptoms described in the history of present illness. She was evaluated in the context of the global COVID-19 pandemic, which necessitated consideration that the patient might be at risk for infection with the SARS-CoV-2 virus that causes COVID-19. Institutional protocols and algorithms that pertain to the evaluation of  patients at risk for COVID-19 are in a state of rapid change based on information released by regulatory bodies including the CDC and federal and state organizations. These policies and algorithms were followed during the patient's care in the ED.  Some ED evaluations and interventions may be delayed as a result of limited staffing during the pandemic.*  Note:  This document was prepared using Dragon voice recognition software and may include unintentional dictation errors.   Gregor Hams, MD 11/13/19 [phone]

## 2019-11-13 ENCOUNTER — Other Ambulatory Visit: Payer: Self-pay

## 2019-11-13 ENCOUNTER — Emergency Department: Payer: PPO

## 2019-11-13 DIAGNOSIS — Z89422 Acquired absence of other left toe(s): Secondary | ICD-10-CM | POA: Diagnosis not present

## 2019-11-13 DIAGNOSIS — I10 Essential (primary) hypertension: Secondary | ICD-10-CM | POA: Diagnosis not present

## 2019-11-13 DIAGNOSIS — Z8249 Family history of ischemic heart disease and other diseases of the circulatory system: Secondary | ICD-10-CM | POA: Diagnosis not present

## 2019-11-13 DIAGNOSIS — I82812 Embolism and thrombosis of superficial veins of left lower extremities: Secondary | ICD-10-CM | POA: Diagnosis not present

## 2019-11-13 DIAGNOSIS — E039 Hypothyroidism, unspecified: Secondary | ICD-10-CM | POA: Diagnosis not present

## 2019-11-13 DIAGNOSIS — I2694 Multiple subsegmental pulmonary emboli without acute cor pulmonale: Secondary | ICD-10-CM | POA: Diagnosis present

## 2019-11-13 DIAGNOSIS — Z975 Presence of (intrauterine) contraceptive device: Secondary | ICD-10-CM

## 2019-11-13 DIAGNOSIS — I2699 Other pulmonary embolism without acute cor pulmonale: Secondary | ICD-10-CM

## 2019-11-13 DIAGNOSIS — Z882 Allergy status to sulfonamides status: Secondary | ICD-10-CM | POA: Diagnosis not present

## 2019-11-13 DIAGNOSIS — C541 Malignant neoplasm of endometrium: Secondary | ICD-10-CM | POA: Diagnosis present

## 2019-11-13 DIAGNOSIS — Z79899 Other long term (current) drug therapy: Secondary | ICD-10-CM | POA: Diagnosis not present

## 2019-11-13 DIAGNOSIS — I129 Hypertensive chronic kidney disease with stage 1 through stage 4 chronic kidney disease, or unspecified chronic kidney disease: Secondary | ICD-10-CM | POA: Diagnosis present

## 2019-11-13 DIAGNOSIS — Z86711 Personal history of pulmonary embolism: Secondary | ICD-10-CM

## 2019-11-13 DIAGNOSIS — Z881 Allergy status to other antibiotic agents status: Secondary | ICD-10-CM | POA: Diagnosis not present

## 2019-11-13 DIAGNOSIS — Z20822 Contact with and (suspected) exposure to covid-19: Secondary | ICD-10-CM | POA: Diagnosis present

## 2019-11-13 DIAGNOSIS — I8001 Phlebitis and thrombophlebitis of superficial vessels of right lower extremity: Secondary | ICD-10-CM

## 2019-11-13 DIAGNOSIS — Z79890 Hormone replacement therapy (postmenopausal): Secondary | ICD-10-CM | POA: Diagnosis not present

## 2019-11-13 DIAGNOSIS — Z853 Personal history of malignant neoplasm of breast: Secondary | ICD-10-CM | POA: Diagnosis not present

## 2019-11-13 DIAGNOSIS — E890 Postprocedural hypothyroidism: Secondary | ICD-10-CM | POA: Diagnosis present

## 2019-11-13 DIAGNOSIS — Z9049 Acquired absence of other specified parts of digestive tract: Secondary | ICD-10-CM | POA: Diagnosis not present

## 2019-11-13 DIAGNOSIS — N1831 Chronic kidney disease, stage 3a: Secondary | ICD-10-CM | POA: Diagnosis not present

## 2019-11-13 DIAGNOSIS — N1832 Chronic kidney disease, stage 3b: Secondary | ICD-10-CM | POA: Diagnosis present

## 2019-11-13 DIAGNOSIS — Z823 Family history of stroke: Secondary | ICD-10-CM | POA: Diagnosis not present

## 2019-11-13 LAB — SARS CORONAVIRUS 2 BY RT PCR (HOSPITAL ORDER, PERFORMED IN ~~LOC~~ HOSPITAL LAB): SARS Coronavirus 2: NEGATIVE

## 2019-11-13 LAB — PROTIME-INR
INR: 1.1 (ref 0.8–1.2)
Prothrombin Time: 13.8 seconds (ref 11.4–15.2)

## 2019-11-13 LAB — APTT: aPTT: 35 seconds (ref 24–36)

## 2019-11-13 LAB — HEPARIN LEVEL (UNFRACTIONATED): Heparin Unfractionated: 1.38 IU/mL — ABNORMAL HIGH (ref 0.30–0.70)

## 2019-11-13 MED ORDER — LEVOTHYROXINE SODIUM 88 MCG PO TABS
88.0000 ug | ORAL_TABLET | Freq: Every day | ORAL | Status: DC
  Administered 2019-11-14 – 2019-11-18 (×5): 88 ug via ORAL
  Filled 2019-11-13 (×6): qty 1

## 2019-11-13 MED ORDER — HEPARIN (PORCINE) 25000 UT/250ML-% IV SOLN
1050.0000 [IU]/h | INTRAVENOUS | Status: DC
  Administered 2019-11-13: 1250 [IU]/h via INTRAVENOUS
  Administered 2019-11-13: 1050 [IU]/h via INTRAVENOUS
  Filled 2019-11-13 (×2): qty 250

## 2019-11-13 MED ORDER — ACETAMINOPHEN 325 MG PO TABS: 650.0000 mg | ORAL_TABLET | Freq: Four times a day (QID) | ORAL | Status: DC | PRN

## 2019-11-13 MED ORDER — ACETAMINOPHEN 650 MG RE SUPP: 650.0000 mg | Freq: Four times a day (QID) | RECTAL | Status: DC | PRN

## 2019-11-13 MED ORDER — IOHEXOL 350 MG/ML SOLN
60.0000 mL | Freq: Once | INTRAVENOUS | Status: AC | PRN
  Administered 2019-11-13: 75 mL via INTRAVENOUS

## 2019-11-13 MED ORDER — HEPARIN BOLUS VIA INFUSION
5000.0000 [IU] | Freq: Once | INTRAVENOUS | Status: AC
  Administered 2019-11-13: 5000 [IU] via INTRAVENOUS
  Filled 2019-11-13: qty 5000

## 2019-11-13 MED ORDER — SODIUM CHLORIDE 0.9 % IV BOLUS
500.0000 mL | Freq: Once | INTRAVENOUS | Status: AC
  Administered 2019-11-13: 500 mL via INTRAVENOUS

## 2019-11-13 MED ORDER — ONDANSETRON HCL 4 MG/2ML IJ SOLN: 4.0000 mg | Freq: Four times a day (QID) | INTRAMUSCULAR | Status: DC | PRN

## 2019-11-13 MED ORDER — LOSARTAN POTASSIUM 50 MG PO TABS
50.0000 mg | ORAL_TABLET | Freq: Every day | ORAL | Status: DC
  Administered 2019-11-13: 50 mg via ORAL
  Filled 2019-11-13 (×2): qty 1

## 2019-11-13 MED ORDER — LATANOPROST 0.005 % OP SOLN
1.0000 [drp] | Freq: Every day | OPHTHALMIC | Status: DC
  Administered 2019-11-13 – 2019-11-17 (×5): 1 [drp] via OPHTHALMIC
  Filled 2019-11-13: qty 2.5

## 2019-11-13 MED ORDER — HYDROCODONE-ACETAMINOPHEN 5-325 MG PO TABS: 1.0000 | ORAL_TABLET | ORAL | Status: DC | PRN

## 2019-11-13 MED ORDER — ONDANSETRON HCL 4 MG PO TABS: 4.0000 mg | ORAL_TABLET | Freq: Four times a day (QID) | ORAL | Status: DC | PRN

## 2019-11-13 NOTE — Progress Notes (Signed)
Patient ID: Whitney Simmons, female   DOB: 1928/05/31, 87 y.o.   MRN: [id number] No charge note as patient was admitted this a.m. H&P reviewed Whitney Simmons is a 86 y.o. female with medical history significant for hypertension, hypothyroidism, CKD 3, history of breast cancer and recent diagnosis of endometrial cancer status post IUD placement by Dr. Gardiner Rhyme on 11/09/2019 who presents to the emergency room with a complaint of pain and swelling and redness of the right leg and shortness of breath.  She denies chest pain.  Denies cough fever or chills.CT angio of the chest showed partially occlusive segmental and subsegmental bilateral PE with no evidence of right ventricular heart strain.  Patient started on a heparin infusion.  Hospitalist consulted for admission.  We will consult oncology Continue on heparin drip Resume home meds

## 2019-11-13 NOTE — Consult Note (Signed)
[facility]  Date of admission:  11/13/2019  Inpatient day:  11/13/2019  Consulting physician: Dr Nolberto Hanlon  Reason for Consultation:  H/o cancer with pulmonary embolism  Chief Complaint: Whitney Simmons is a 86 y.o. female with grade I endometrial cancer who was admitted through the emergency room with pulmonary emboli.  HPI:  The patient notes a history of DCIS of the left breast 5 years ago.  She underwent lumpectomy.  She declined radiation and adjuvant endocrine therapy.  The patient presented with post-menopasual bleeding.  Pelvic ultrasound on 09/05/2019 revealed a 2.2 cm echogenic mass within the endometrial canal.  She underwent D&C, Myosure/hysteroscopy on 10/18/2019.  Endometrim curettage revealed FIGO grade I endometrial adenocarcinoma, endometroid type.  She was seen for initial consultation by Dr Theora Gianotti on 10/26/2019.  She noted some vaginal spotting.  She was not interested in surgery.  Alternatives to surgery were discussed including Megace and Mirena progestin IUD.  She was not interested in radiation.  CT scans were ordered to r/o metastatic disease.  The Mirena IUD was placed by Dr Gilman Schmidt on 11/09/2019.  Chest, abdomen, and pelvis CT on 11/08/2019 revealed no evidence of metastatic disease.  She states that she noticed a pink spot on her medial right distal lower extremity on 05/13 or 11/11/2019.  She then developed edema.  She subsequently developed mild shortness of breath.  She presented to [facility] ER on 11/12/2019 with shortness of breath and pain and swelling of the right lower extremity. Right lower extremity duplex revealed a nonocclusive thrombus/superficial thrombophlebitis of the greater saphenous vein below the level of the knee.  Chest CT angiogram on 11/13/2019 revealed a partially occlusive segmental and subsegmental bilateral pulmonary emboli within the right middle lobe, right lower lobe, left upper lobe and left lower lobes.  There was  no evidence of right ventricular heart strain.    Heparin was initiated. Creatinine was 1.40 (CrCl 29.4 ml/min).  Symptomatically, she is doing fair.  She is overwhelmed by the ongoing medical issues.  She notes no recent vaginal bleeding.  She denies any weight loss.   Past Medical History:  Diagnosis Date  . Breast microcalcification, mammographic 01/31/2014  . Cancer ([place]) 02-15-14   DCIS left breast, 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 Declined postoperative radiation and antiestrogen therapy.  . Edema   . Hyperlipidemia   . Hypertension   . Hypothyroidism   . IBS (irritable bowel syndrome)   . Irregular heart beat   . stage III kidney disease   . Thyroid disease    s/p radioactive iodine ablation 30 years ago    Past Surgical History:  Procedure Laterality Date  . BREAST LUMPECTOMY Left 2015   DUCTAL CARCINOMA IN SITU WITH CALCIFICATIONS  . BREAST SURGERY Left 02-15-14   wide excision  . CATARACT EXTRACTION    . CHOLECYSTECTOMY    . HYSTEROSCOPY WITH D & C N/A 10/18/2019   Procedure: DILATATION AND CURETTAGE /HYSTEROSCOPY;  Surgeon: Homero Fellers, MD;  Location: [facility] ORS;  Service: Gynecology;  Laterality: N/A;  . TOE AMPUTATION     left 5th toe    Family History  Problem Relation Age of Onset  . Heart disease Mother   . Heart disease Father   . Heart attack Father   . Heart disease Maternal Aunt   . Heart disease Brother   . AAA (abdominal aortic aneurysm) Other   . Stroke Maternal Aunt   . Breast cancer Neg Hx  Social History:  reports that she has never smoked. She has never used smokeless tobacco. She reports that she does not drink alcohol or use drugs.  The patient denies any exposure to radiation or toxins.  She is a retired Pharmacist, hospital.  The patient lives in [place].  Her husband is in a care facility in [facility].  Her daughter from [place] is currently in town.  She is alone today.  Allergies:  Allergies  Allergen  Reactions  . Levaquin [Levofloxacin In D5w] Other (See Comments)    hallucinations  . Sulfa Antibiotics     Hallucinations     (Not in a hospital admission)  Review of Systems: GENERAL:  Feels "ok".  No fevers, sweats or weight loss. PERFORMANCE STATUS (ECOG):  1 HEENT:  Decreased hearing.  No visual changes, runny nose, sore throat, mouth sores or tenderness. Lungs:  Slight shortness of breath .  No cough.  No hemoptysis. Cardiac:  No chest pain, palpitations, orthopnea, or PND. GI:  No nausea, vomiting, diarrhea, constipation, melena or hematochezia. GU:  Stage III chronic kidney disease.  No urgency, frequency, dysuria, or hematuria. Musculoskeletal:  No back pain.  No joint pain.  No muscle tenderness. Extremities:  Swelling right lower extremity. Skin:  No rashes or skin changes. Neuro:  No headache, numbness or weakness, balance or coordination issues. Endocrine:  Thyroid disease.  No diabetes, hot flashes or night sweats. Psych:  Anxiety/stress.  No depression. Pain:  No focal pain. Review of systems:  All other systems reviewed and found to be negative.  Physical Exam:  Blood pressure (!) 141/70, pulse 80, temperature 97.8 F (36.6 C), resp. rate (!) 25, height 5\' 7"  (1.702 m), weight 173 lb (78.5 kg), SpO2 95 %.  GENERAL:  Well developed, well nourished, woman sitting comfortably in the emergency room in no acute distress. MENTAL STATUS:  Alert and oriented to person, place and time. HEAD:  Short styled gray hair.  Glasses propped on head.  Normocephalic, atraumatic, face symmetric, no Cushingoid features. EYES:  Blue eyes.  Pupils equal round and reactive to light and accomodation.  No conjunctivitis or scleral icterus. ENT:  Oropharynx clear without lesion.  Tongue normal. Mucous membranes moist.  RESPIRATORY:  Clear to auscultation without rales, wheezes or rhonchi. CARDIOVASCULAR:  Regular rate and rhythm without murmur, rub or gallop. ABDOMEN:  Soft, non-tender,  with active bowel sounds, and no hepatosplenomegaly.  No masses. SKIN:  No rashes, ulcers or lesions. EXTREMITIES: Right lower extremity edema with right increased edema medial right distal leg with slight overlying pinkness.  No palpable cords. LYMPH NODES: No palpable cervical, supraclavicular, axillary or inguinal adenopathy  NEUROLOGICAL: Unremarkable. PSYCH:  Appropriate.   Results for orders placed or performed during the hospital encounter of 11/12/19 (from the past 48 hour(s))  CBC     Status: Abnormal   Collection Time: 11/12/19  9:59 PM  Result Value Ref Range   WBC 10.8 (H) 4.0 - 10.5 K/uL   RBC 4.55 3.87 - 5.11 MIL/uL   Hemoglobin 13.4 12.0 - 15.0 g/dL   HCT 40.6 36.0 - 46.0 %   MCV 89.2 80.0 - 100.0 fL   MCH 29.5 26.0 - 34.0 pg   MCHC 33.0 30.0 - 36.0 g/dL   RDW 13.2 11.5 - 15.5 %   Platelets 283 150 - 400 K/uL   nRBC 0.0 0.0 - 0.2 %    Comment: Performed at [facility], [address] [place],  [place] [address]  Comprehensive metabolic panel     Status: Abnormal   Collection Time: 11/12/19  9:59 PM  Result Value Ref Range   Sodium 139 135 - 145 mmol/L   Potassium 4.5 3.5 - 5.1 mmol/L   Chloride 104 98 - 111 mmol/L   CO2 26 22 - 32 mmol/L   Glucose, Bld 110 (H) 70 - 99 mg/dL    Comment: Glucose reference range applies only to samples taken after fasting for at least 8 hours.   BUN 26 (H) 8 - 23 mg/dL   Creatinine, Ser 1.40 (H) 0.44 - 1.00 mg/dL   Calcium 8.6 (L) 8.9 - 10.3 mg/dL   Total Protein 7.6 6.5 - 8.1 g/dL   Albumin 3.8 3.5 - 5.0 g/dL   AST 12 (L) 15 - 41 U/L   ALT 11 0 - 44 U/L   Alkaline Phosphatase 94 38 - 126 U/L   Total Bilirubin 0.7 0.3 - 1.2 mg/dL   GFR calc non Af Amer 33 (L) >60 mL/min   GFR calc Af Amer 39 (L) >60 mL/min   Anion gap 9 5 - 15    Comment: Performed at [facility], [address] [place], [place] [address]  APTT     Status: None   Collection Time: 11/13/19  2:45 AM  Result Value Ref Range   aPTT 35 24  - 36 seconds    Comment: Performed at [facility], [address] [place], [place] [address]  Protime-INR     Status: None   Collection Time: 11/13/19  2:45 AM  Result Value Ref Range   Prothrombin Time 13.8 11.4 - 15.2 seconds   INR 1.1 0.8 - 1.2    Comment: (NOTE) INR goal varies based on device and disease states. Performed at [facility], [address] [place], [place] [address]   SARS Coronavirus 2 by RT PCR (hospital order, performed in [facility] hospital lab) Nasopharyngeal Nasopharyngeal Swab     Status: None   Collection Time: 11/13/19  4:05 AM   Specimen: Nasopharyngeal Swab  Result Value Ref Range   SARS Coronavirus 2 NEGATIVE NEGATIVE    Comment: (NOTE) SARS-CoV-2 target nucleic acids are NOT DETECTED. The SARS-CoV-2 RNA is generally detectable in upper and lower respiratory specimens during the acute phase of infection. The lowest concentration of SARS-CoV-2 viral copies this assay can detect is 250 copies / mL. A negative result does not preclude SARS-CoV-2 infection and should not be used as the sole basis for treatment or other patient management decisions.  A negative result may occur with improper specimen collection / handling, submission of specimen other than nasopharyngeal swab, presence of viral mutation(s) within the areas targeted by this assay, and inadequate number of viral copies (<250 copies / mL). A negative result must be combined with clinical observations, patient history, and epidemiological information. Fact Sheet for Patients:   StrictlyIdeas.no Fact Sheet for Healthcare Providers: BankingDealers.co.za This test is not yet approved or cleared  by the [place] FDA and has been authorized for detection and/or diagnosis of SARS-CoV-2 by FDA under an Emergency Use Authorization (EUA).  This EUA will remain in effect (meaning this test can be used) for the duration of  the COVID-19 declaration under Section 564(b)(1) of the Act, 21 U.S.C. section 360bbb-3(b)(1), unless the authorization is terminated or revoked sooner. Performed at [facility], [address], [place],  [address]    CT Angio Chest PE W and/or Wo  Contrast  Result Date: 11/13/2019 CLINICAL DATA:  Shortness of breath right leg pain IUD placed for endometrial cancer EXAM: CT ANGIOGRAPHY CHEST WITH CONTRAST TECHNIQUE: Multidetector CT imaging of the chest was performed using the standard protocol during bolus administration of intravenous contrast. Multiplanar CT image reconstructions and MIPs were obtained to evaluate the vascular anatomy. CONTRAST:  56mL OMNIPAQUE IOHEXOL 350 MG/ML SOLN COMPARISON:  Nov 08, 2019 FINDINGS: Cardiovascular: There is a optimal opacification of the pulmonary arteries. There is partially occlusive thrombus seen at the bifurcation of the right middle lobe and right lower lobe segmental and subsegmental pulmonary arterial branches. There is partially occlusive thrombus in the posterior right lower lobe subsegmental pulmonary arteries. There is partially occlusive thrombus at the bifurcation of the left segmental upper lobe and lower lobe pulmonary arterial branches which extend into the subsegmental branches. No large central pulmonary embolism is seen. There is moderate cardiomegaly. No evidence of right ventricular heart strain. There is normal three-vessel brachiocephalic anatomy without proximal stenosis. The thoracic aorta is normal in appearance. Scattered aortic atherosclerosis. Mediastinum/Nodes: No hilar, mediastinal, or axillary adenopathy. Thyroid gland, trachea, and esophagus demonstrate no significant findings. Lungs/Pleura: The lungs are clear. No pleural effusion or pneumothorax. No airspace consolidation. Upper Abdomen: No acute abnormalities present in the visualized portions of the upper abdomen. Musculoskeletal: No chest wall abnormality. No  acute or significant osseous findings. Review of the MIP images confirms the above findings. IMPRESSION: Partially occlusive segmental and subsegmental bilateral pulmonary emboli as described above within the right middle lobe, right lower lobe, left upper lobe and left lower lobes. No evidence of right ventricular heart strain. Aortic Atherosclerosis (ICD10-I70.0). These results were called by telephone at the time of interpretation on 11/13/2019 at 2:12 am to provider [facility] , who verbally acknowledged these results. Electronically Signed   By: Prudencio Pair M.D.   On: 11/13/2019 02:13   US Venous Img Lower Unilateral Right (DVT)  Result Date: 11/12/2019 CLINICAL DATA:  Right leg swelling and pain EXAM: Right LOWER EXTREMITY VENOUS DOPPLER ULTRASOUND TECHNIQUE: Gray-scale sonography with compression, as well as color and duplex ultrasound, were performed to evaluate the deep venous system(s) from the level of the common femoral vein through the popliteal and proximal calf veins. COMPARISON:  None. FINDINGS: VENOUS Normal compressibility of the common femoral, superficial femoral, and popliteal veins, as well as the visualized calf veins. Visualized portions of profunda femoral vein are unremarkable. Nonocclusive thrombus within the greater saphenous vein below the knee. Vein is noncompressible. No filling defects to suggest DVT on grayscale or color Doppler imaging. Doppler waveforms show normal direction of venous flow, normal respiratory plasticity and response to augmentation. Limited views of the contralateral common femoral vein are unremarkable. OTHER None. Limitations: none IMPRESSION: 1. Negative for acute deep venous thrombosis of the right lower extremity 2. Nonocclusive thrombus/superficial thrombophlebitis of the greater saphenous vein below the level of the knee. Electronically Signed   By: Donavan Foil M.D.   On: 11/12/2019 23:14    Assessment:  The patient is a 86 y.o. woman with  localized endometrial cancer admitted with right lower extremity thrombosis and bilateral pulmonary emboli.  Chest, abdomen, and pelvis CT on 11/08/2019 revealed no evidence of metastatic disease.  Mirena IUD was placed on 11/09/2019.  Right lower extremity duplex on 11/12/2019 revealed a nonocclusive thrombus/superficial thrombophlebitis of the greater saphenous vein below the level of the knee.  Chest CT angiogram on 11/13/2019 revealed a partially occlusive segmental and subsegmental bilateral pulmonary emboli  within the right middle lobe, right lower lobe, left upper lobe and left lower lobes.  There was no evidence of right ventricular heart strain.    She has chronic renal insufficiency.  Creatinine was 1.40 (CrCl 29.4 ml/min).  Symptomatically, she notes a 2-3 day history of right lower extremity edema and recent shortness of breath.  Exam reveals tender right lower extremity thrombophlebitis and right lower extremity edema.  Plan:   1.  Endometrial cancer  Patient with localized disease.  She declined surgery and opted for a Mirena IUD (placed 11/09/2019).  Mirena IUD can be associated with thrombosis, although if implicated in this case occurred extremely early.  Megace contraindicated (higher risk of thrombosis).  Surgery and radiation previously discussed.  Will contact Dr Theora Gianotti in AM. 2.   Bilateral pulmonary emboli and right superficial thrombophlebitis  Patient acutely developed symptoms in the past 2-3 days .  Unclear if developed DVT and/or PE after IUD placement or was developing thrombosis prior to symptomatology.  Patient currently on heparin.  Given CrCl < 30 m/min, discuss plan for Coumadin.  Anticipate heparin bridge (follow PTT) until Coumadin therapeutic.  Thrombosis likely secondary to malignancy, age, and possibly Mirena IUD.  Limited hypercoagulable work-up.   Thank you for allowing me to participate in Whitney Simmons 's care.  I will follow her closely with  you while hospitalized and after discharge in the outpatient department.   Lequita Asal, MD  11/13/2019, 11:29 AM

## 2019-11-13 NOTE — ED Notes (Signed)
Attempted to call report to 2A and was told they are unable to accept any patients at this time due to inadequate staffing, and it will have to be after shift change.

## 2019-11-13 NOTE — ED Notes (Signed)
Meal provided per pt request

## 2019-11-13 NOTE — Progress Notes (Signed)
ANTICOAGULATION CONSULT NOTE - Initial Consult  Pharmacy Consult for Heparin  Indication: pulmonary embolus  Allergies  Allergen Reactions  . Levaquin [Levofloxacin In D5w] Other (See Comments)    hallucinations  . Sulfa Antibiotics     Hallucinations     Patient Measurements: Height: 5\' 7"  (170.2 cm) Weight: 78.5 kg (173 lb) IBW/kg (Calculated) : 61.6 HEPARIN DW (KG): 77.4  Vital Signs: Temp: 98.7 F (37.1 C) (05/16 1532) Temp Source: Oral (05/16 1532) BP: 178/69 (05/16 1532) Pulse Rate: 79 (05/16 1532)  Labs: Recent Labs    11/12/19 2159 11/13/19 0245 11/13/19 1505  HGB 13.4  --   --   HCT 40.6  --   --   PLT 283  --   --   APTT  --  35  --   LABPROT  --  13.8  --   INR  --  1.1  --   HEPARINUNFRC  --   --  1.38*  CREATININE 1.40*  --   --     Estimated Creatinine Clearance: 29.4 mL/min (A) (by C-G formula based on SCr of 1.4 mg/dL (H)).   Medical History: Past Medical History:  Diagnosis Date  . Breast microcalcification, mammographic 01/31/2014  . Cancer ([place]) 02-15-14   DCIS left breast, 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 Declined postoperative radiation and antiestrogen therapy.  . Edema   . Hyperlipidemia   . Hypertension   . Hypothyroidism   . IBS (irritable bowel syndrome)   . Irregular heart beat   . stage III kidney disease   . Thyroid disease    s/p radioactive iodine ablation 30 years ago    Medications:  Medications Prior to Admission  Medication Sig Dispense Refill Last Dose  . aspirin EC 81 MG tablet Take 81 mg by mouth daily.   Past Month at Unknown time  . furosemide (LASIX) 20 MG tablet Take 1 tablet (20 mg total) by mouth daily. 90 tablet 2 11/12/2019 at 1000  . latanoprost (XALATAN) 0.005 % ophthalmic solution Place 1 drop into both eyes at bedtime.    11/11/2019 at 2300  . levothyroxine (EUTHYROX) 88 MCG tablet TAKE 1 TABLET BY MOUTH ONCE DAILY BEFORE BREAKFAST 90 tablet 3 11/12/2019 at 0900  . losartan (COZAAR) 50 MG tablet  Take 1 tablet (50 mg total) by mouth daily. (Patient taking differently: Take 50 mg by mouth at bedtime. ) 90 tablet 2 11/11/2019 at 2300  . Multiple Vitamin (MULTIVITAMIN) tablet Take 1 tablet by mouth daily.   Past Week at Unknown time  . nebivolol (BYSTOLIC) 5 MG tablet Take 1 tablet (5 mg total) by mouth daily. 90 tablet 2 11/12/2019 at 1000    Assessment: Asked to initiate Heparin for PE.  Baseline labs ordered.  No anticoagulants per PTA med list.    Goal of Therapy:  Heparin level 0.3-0.7 units/ml Monitor platelets by anticoagulation protocol: Yes   Plan:  5/16@1705  HL 1.38, supratherapeutic. Spoke to nurse, instructed to hold heparin infusion for 1 hour, then will decrease rate to 1050 units/hr Check HL ~ 8 hours after changes.  Teyana Pierron A Akaila Rambo 11/13/2019,5:00 PM

## 2019-11-13 NOTE — ED Notes (Signed)
RN has spoken with MD Kurtis Bushman via phone regarding patient home medications.

## 2019-11-13 NOTE — H&P (Signed)
History and Physical    Whitney Simmons [id number] DOB: 04/26/1928 DOA: 11/12/2019  PCP: Leone Haven, MD   Patient coming from: Home I have personally briefly reviewed patient's old medical records in [place]  Chief Complaint: Right leg pain, shortness of breath  HPI: Whitney Simmons is a 86 y.o. female with medical history significant for hypertension, hypothyroidism, CKD 3, history of breast cancer and recent diagnosis of endometrial cancer status post IUD placement by Dr. Gardiner Rhyme on 11/09/2019 who presents to the emergency room with a complaint of pain and swelling and redness of the right leg and shortness of breath.  She denies chest pain.  Denies cough fever or chills  ED Course: In the ER she was afebrile, slightly tachycardic with heart rate of 112 with O2 sat 100% on room air blood work for the most part unremarkable.  Creatinine 1.4 which is her baseline.  Right lower extremity Doppler showed no DVT but did show a nonocclusive thrombus/superficial thrombophlebitis of the greater saphenous vein below the level of the knee.  CT angio of the chest showed partially occlusive segmental and subsegmental bilateral PE with no evidence of right ventricular heart strain.  Patient started on a heparin infusion.  Hospitalist consulted for admission.  Review of Systems: As per HPI otherwise 10 point review of systems negative.    Past Medical History:  Diagnosis Date  . Breast microcalcification, mammographic 01/31/2014  . 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 Declined postoperative radiation and antiestrogen therapy.  . Edema   . Hyperlipidemia   . Hypertension   . Hypothyroidism   . IBS (irritable bowel syndrome)   . Irregular heart beat   . stage III kidney disease   . Thyroid disease    s/p radioactive iodine ablation 30 years ago    Past Surgical History:  Procedure Laterality Date  . BREAST LUMPECTOMY Left 2015   DUCTAL CARCINOMA IN SITU WITH CALCIFICATIONS  . BREAST SURGERY Left 02-15-14   wide excision  . CATARACT EXTRACTION    . CHOLECYSTECTOMY    . HYSTEROSCOPY WITH D & C N/A 10/18/2019   Procedure: DILATATION AND CURETTAGE /HYSTEROSCOPY;  Surgeon: Homero Fellers, MD;  Location: [facility] ORS;  Service: Gynecology;  Laterality: N/A;  . TOE AMPUTATION     left 5th toe     reports that she has never smoked. She has never used smokeless tobacco. She reports that she does not drink alcohol or use drugs.  Allergies  Allergen Reactions  . Levaquin [Levofloxacin In D5w] Other (See Comments)    hallucinations  . Sulfa Antibiotics     Hallucinations     Family History  Problem Relation Age of Onset  . Heart disease Mother   . Heart disease Father   . Heart attack Father   . Heart disease Maternal Aunt   . Heart disease Brother   . AAA (abdominal aortic aneurysm) Other   . Stroke Maternal Aunt   . Breast cancer Neg Hx      Prior to Admission medications   Medication Sig Start Date End Date Taking? Authorizing Provider  furosemide (LASIX) 20 MG tablet Take 1 tablet (20 mg total) by mouth daily. 09/26/19 06/22/20  Leone Haven, MD  latanoprost (XALATAN) 0.005 % ophthalmic solution Place 1 drop into both eyes at bedtime.  01/31/19   [provider]  levothyroxine (EUTHYROX) 88 MCG tablet TAKE 1 TABLET BY MOUTH ONCE DAILY  BEFORE BREAKFAST 10/05/19   Leone Haven, MD  losartan (COZAAR) 50 MG tablet Take 1 tablet (50 mg total) by mouth daily. 09/26/19   Leone Haven, MD  Multiple Vitamin (MULTIVITAMIN) tablet Take 1 tablet by mouth daily.    [provider]  nebivolol (BYSTOLIC) 5 MG tablet Take 1 tablet (5 mg total) by mouth daily. 10/06/19   Leone Haven, MD    Physical Exam: Vitals:   11/12/19 2158 11/12/19 2330 11/13/19 0030 11/13/19 0244  BP: (!) 170/59 123/67 (!) 151/54 (!) 159/68  Pulse: (!) 112 70 70 75  Resp: 19 18 18 18   Temp: 97.8 F (36.6  C)     SpO2: 97% 100% 97% 96%  Weight:      Height:         Vitals:   11/12/19 2158 11/12/19 2330 11/13/19 0030 11/13/19 0244  BP: (!) 170/59 123/67 (!) 151/54 (!) 159/68  Pulse: (!) 112 70 70 75  Resp: 19 18 18 18   Temp: 97.8 F (36.6 C)     SpO2: 97% 100% 97% 96%  Weight:      Height:        Constitutional: Alert and awake, oriented x3, not in any acute distress. Eyes: PERLA, EOMI, irises appear normal, anicteric sclera,  ENMT: external ears and nose appear normal, hearing impaired.           lips appears normal, oropharynx mucosa, tongue, posterior pharynx appear normal  Neck: neck appears normal, no masses, normal ROM, no thyromegaly, no JVD  CVS: S1-S2 clear, no murmur rubs or gallops,  , no carotid bruits, pedal pulses palpable, 1-2+ edema right lower extremity, Respiratory:  clear to auscultation bilaterally, no wheezing, rales or rhonchi. Respiratory effort normal. No accessory muscle use.  Abdomen: soft nontender, nondistended, normal bowel sounds, no hepatosplenomegaly, no hernias Musculoskeletal: : no cyanosis, clubbing , no contractures or atrophy Neuro: Cranial nerves II-XII intact, sensation, reflexes normal, strength Psych: judgement and insight appear normal, stable mood and affect,  Skin: no rashes or lesions or ulcers, .  Red tender erythematous area about 2 cm diameter posterior medial right calf  Labs on Admission: I have personally reviewed following labs and imaging studies  CBC: Recent Labs  Lab 11/12/19 2159  WBC 10.8*  HGB 13.4  HCT 40.6  MCV 89.2  PLT [id number]   Basic Metabolic Panel: Recent Labs  Lab 11/12/19 2159  NA 139  K 4.5  CL 104  CO2 26  GLUCOSE 110*  BUN 26*  CREATININE 1.40*  CALCIUM 8.6*   GFR: Estimated Creatinine Clearance: 29.4 mL/min (A) (by C-G formula based on SCr of 1.4 mg/dL (H)). Liver Function Tests: Recent Labs  Lab 11/12/19 2159  AST 12*  ALT 11  ALKPHOS 94  BILITOT 0.7  PROT 7.6  ALBUMIN 3.8   No  results for input(s): LIPASE, AMYLASE in the last 168 hours. No results for input(s): AMMONIA in the last 168 hours. Coagulation Profile: No results for input(s): INR, PROTIME in the last 168 hours. Cardiac Enzymes: No results for input(s): CKTOTAL, CKMB, CKMBINDEX, TROPONINI in the last 168 hours. BNP (last 3 results) No results for input(s): PROBNP in the last 8760 hours. HbA1C: No results for input(s): HGBA1C in the last 72 hours. CBG: No results for input(s): GLUCAP in the last 168 hours. Lipid Profile: No results for input(s): CHOL, HDL, LDLCALC, TRIG, CHOLHDL, LDLDIRECT in the last 72 hours. Thyroid Function Tests: No results for input(s): TSH, T4TOTAL, FREET4,  T3FREE, THYROIDAB in the last 72 hours. Anemia Panel: No results for input(s): VITAMINB12, FOLATE, FERRITIN, TIBC, IRON, RETICCTPCT in the last 72 hours. Urine analysis:    Component Value Date/Time   COLORURINE YELLOW (A) 09/14/2017 1151   APPEARANCEUR CLOUDY (A) 09/14/2017 1151   LABSPEC 1.005 09/14/2017 1151   PHURINE 5.0 09/14/2017 1151   GLUCOSEU NEGATIVE 09/14/2017 1151   HGBUR NEGATIVE 09/14/2017 1151   BILIRUBINUR negative 06/16/2019 1322   BILIRUBINUR negative 01/16/2017 1223   KETONESUR negative 06/16/2019 1322   KETONESUR NEGATIVE 09/14/2017 1151   PROTEINUR =30 (A) 06/16/2019 1322   PROTEINUR NEGATIVE 09/14/2017 1151   UROBILINOGEN 0.2 06/16/2019 1322   NITRITE Negative 06/16/2019 1322   NITRITE NEGATIVE 09/14/2017 1151   LEUKOCYTESUR Moderate (2+) (A) 06/16/2019 1322    Radiological Exams on Admission: CT Angio Chest PE W and/or Wo Contrast  Result Date: 11/13/2019 CLINICAL DATA:  Shortness of breath right leg pain IUD placed for endometrial cancer EXAM: CT ANGIOGRAPHY CHEST WITH CONTRAST TECHNIQUE: Multidetector CT imaging of the chest was performed using the standard protocol during bolus administration of intravenous contrast. Multiplanar CT image reconstructions and MIPs were obtained to  evaluate the vascular anatomy. CONTRAST:  63mL OMNIPAQUE IOHEXOL 350 MG/ML SOLN COMPARISON:  Nov 08, 2019 FINDINGS: Cardiovascular: There is a optimal opacification of the pulmonary arteries. There is partially occlusive thrombus seen at the bifurcation of the right middle lobe and right lower lobe segmental and subsegmental pulmonary arterial branches. There is partially occlusive thrombus in the posterior right lower lobe subsegmental pulmonary arteries. There is partially occlusive thrombus at the bifurcation of the left segmental upper lobe and lower lobe pulmonary arterial branches which extend into the subsegmental branches. No large central pulmonary embolism is seen. There is moderate cardiomegaly. No evidence of right ventricular heart strain. There is normal three-vessel brachiocephalic anatomy without proximal stenosis. The thoracic aorta is normal in appearance. Scattered aortic atherosclerosis. Mediastinum/Nodes: No hilar, mediastinal, or axillary adenopathy. Thyroid gland, trachea, and esophagus demonstrate no significant findings. Lungs/Pleura: The lungs are clear. No pleural effusion or pneumothorax. No airspace consolidation. Upper Abdomen: No acute abnormalities present in the visualized portions of the upper abdomen. Musculoskeletal: No chest wall abnormality. No acute or significant osseous findings. Review of the MIP images confirms the above findings. IMPRESSION: Partially occlusive segmental and subsegmental bilateral pulmonary emboli as described above within the right middle lobe, right lower lobe, left upper lobe and left lower lobes. No evidence of right ventricular heart strain. Aortic Atherosclerosis (ICD10-I70.0). These results were called by telephone at the time of interpretation on 11/13/2019 at 2:12 am to provider [facility] , who verbally acknowledged these results. Electronically Signed   By: Prudencio Pair M.D.   On: 11/13/2019 02:13   US Venous Img Lower Unilateral Right  (DVT)  Result Date: 11/12/2019 CLINICAL DATA:  Right leg swelling and pain EXAM: Right LOWER EXTREMITY VENOUS DOPPLER ULTRASOUND TECHNIQUE: Gray-scale sonography with compression, as well as color and duplex ultrasound, were performed to evaluate the deep venous system(s) from the level of the common femoral vein through the popliteal and proximal calf veins. COMPARISON:  None. FINDINGS: VENOUS Normal compressibility of the common femoral, superficial femoral, and popliteal veins, as well as the visualized calf veins. Visualized portions of profunda femoral vein are unremarkable. Nonocclusive thrombus within the greater saphenous vein below the knee. Vein is noncompressible. No filling defects to suggest DVT on grayscale or color Doppler imaging. Doppler waveforms show normal direction of venous flow, normal respiratory  plasticity and response to augmentation. Limited views of the contralateral common femoral vein are unremarkable. OTHER None. Limitations: none IMPRESSION: 1. Negative for acute deep venous thrombosis of the right lower extremity 2. Nonocclusive thrombus/superficial thrombophlebitis of the greater saphenous vein below the level of the knee. Electronically Signed   By: Donavan Foil M.D.   On: 11/12/2019 23:14    EKG: Independently reviewed.   Assessment/Plan Principal Problem: Bilateral pulmonary embolism (HCC)   Superficial thrombophlebitis of right leg -Patient presents with shortness of breath and swelling right lower extremity 3 days after IUD placement by gynecologist for endometrial CA -Right lower extremity Doppler showing thrombophlebitis of greater saphenous distal to the knee -CTA chest showing bilateral segmental and subsegmental PEs -Continue heparin infusion pending transition to oral anticoagulant  -Keep right leg elevated  Endometrial CA Status post IUD (intrauterine device) placement on 11/09/2019 -Followed by Dr. Nechama Guard     Hypothyroidism -Continue home  levothyroxine pending med rec    Hypertension -Continue home meds    Chronic kidney disease, stage III (moderate) -Stable at baseline    History of breast cancer -No acute concerns   DVT prophylaxis: On heparin infusion Code Status: full code  Family Communication:  none  Disposition Plan: Back to previous home environment Consults called: none  Status:obs    Athena Masse MD [facility]     11/13/2019, 3:00 AM

## 2019-11-13 NOTE — Progress Notes (Signed)
ANTICOAGULATION CONSULT NOTE - Initial Consult  Pharmacy Consult for Heparin  Indication: pulmonary embolus  Allergies  Allergen Reactions  . Levaquin [Levofloxacin In D5w] Other (See Comments)    hallucinations  . Sulfa Antibiotics     Hallucinations     Patient Measurements: Height: 5\' 7"  (170.2 cm) Weight: 78.5 kg (173 lb) IBW/kg (Calculated) : 61.6 HEPARIN DW (KG): 77.4  Vital Signs: Temp: 97.8 F (36.6 C) (05/15 2158) BP: 159/68 (05/16 0244) Pulse Rate: 75 (05/16 0244)  Labs: Recent Labs    11/12/19 2159 11/13/19 0245  HGB 13.4  --   HCT 40.6  --   PLT 283  --   APTT  --  35  LABPROT  --  13.8  INR  --  1.1  CREATININE 1.40*  --     Estimated Creatinine Clearance: 29.4 mL/min (A) (by C-G formula based on SCr of 1.4 mg/dL (H)).   Medical History: Past Medical History:  Diagnosis Date  . Breast microcalcification, mammographic 01/31/2014  . 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 Declined postoperative radiation and antiestrogen therapy.  . Edema   . Hyperlipidemia   . Hypertension   . Hypothyroidism   . IBS (irritable bowel syndrome)   . Irregular heart beat   . stage III kidney disease   . Thyroid disease    s/p radioactive iodine ablation 30 years ago    Medications:  (Not in a hospital admission)   Assessment: Asked to initiate Heparin for PE.  Baseline labs ordered.  No anticoagulants per PTA med list.  Goal of Therapy:  Heparin level 0.3-0.7 units/ml Monitor platelets by anticoagulation protocol: Yes   Plan:  Heparin 5000 units IV bolus x 1 then infusion at 1250 units/hr Check HL ~ 8 hours after heparin started  Hart Robinsons A 11/13/2019,3:16 AM

## 2019-11-14 ENCOUNTER — Telehealth: Payer: Self-pay

## 2019-11-14 DIAGNOSIS — E039 Hypothyroidism, unspecified: Secondary | ICD-10-CM

## 2019-11-14 DIAGNOSIS — I10 Essential (primary) hypertension: Secondary | ICD-10-CM

## 2019-11-14 DIAGNOSIS — N1831 Chronic kidney disease, stage 3a: Secondary | ICD-10-CM

## 2019-11-14 DIAGNOSIS — C541 Malignant neoplasm of endometrium: Secondary | ICD-10-CM

## 2019-11-14 LAB — CBC
HCT: 34.9 % — ABNORMAL LOW (ref 36.0–46.0)
Hemoglobin: 11.5 g/dL — ABNORMAL LOW (ref 12.0–15.0)
MCH: 29 pg (ref 26.0–34.0)
MCHC: 33 g/dL (ref 30.0–36.0)
MCV: 87.9 fL (ref 80.0–100.0)
Platelets: 209 10*3/uL (ref 150–400)
RBC: 3.97 MIL/uL (ref 3.87–5.11)
RDW: 13.3 % (ref 11.5–15.5)
WBC: 8.9 10*3/uL (ref 4.0–10.5)
nRBC: 0 % (ref 0.0–0.2)

## 2019-11-14 LAB — BASIC METABOLIC PANEL
Anion gap: 10 (ref 5–15)
BUN: 21 mg/dL (ref 8–23)
CO2: 22 mmol/L (ref 22–32)
Calcium: 8.7 mg/dL — ABNORMAL LOW (ref 8.9–10.3)
Chloride: 107 mmol/L (ref 98–111)
Creatinine, Ser: 1.14 mg/dL — ABNORMAL HIGH (ref 0.44–1.00)
GFR calc Af Amer: 49 mL/min — ABNORMAL LOW (ref >60)
GFR calc non Af Amer: 43 mL/min — ABNORMAL LOW (ref >60)
Glucose, Bld: 127 mg/dL — ABNORMAL HIGH (ref 70–99)
Potassium: 4.3 mmol/L (ref 3.5–5.1)
Sodium: 139 mmol/L (ref 135–145)

## 2019-11-14 LAB — HEPARIN LEVEL (UNFRACTIONATED)
Heparin Unfractionated: 1.09 IU/mL — ABNORMAL HIGH (ref 0.30–0.70)
Heparin Unfractionated: 0.57 IU/mL (ref 0.30–0.70)
Heparin Unfractionated: 0.62 IU/mL (ref 0.30–0.70)

## 2019-11-14 MED ORDER — NEBIVOLOL HCL 5 MG PO TABS
5.0000 mg | ORAL_TABLET | Freq: Every day | ORAL | Status: DC
  Administered 2019-11-14 – 2019-11-18 (×5): 5 mg via ORAL
  Filled 2019-11-14 (×5): qty 1

## 2019-11-14 MED ORDER — LOSARTAN POTASSIUM 50 MG PO TABS
50.0000 mg | ORAL_TABLET | Freq: Every day | ORAL | Status: DC
  Administered 2019-11-14 – 2019-11-15 (×2): 50 mg via ORAL
  Filled 2019-11-14 (×2): qty 1

## 2019-11-14 MED ORDER — LOSARTAN POTASSIUM 50 MG PO TABS: 50.0000 mg | ORAL_TABLET | Freq: Every day | ORAL | Status: DC

## 2019-11-14 MED ORDER — BISACODYL 5 MG PO TBEC
5.0000 mg | DELAYED_RELEASE_TABLET | Freq: Once | ORAL | Status: AC
  Administered 2019-11-14: 5 mg via ORAL
  Filled 2019-11-14: qty 1

## 2019-11-14 MED ORDER — WARFARIN SODIUM 5 MG PO TABS
5.0000 mg | ORAL_TABLET | Freq: Once | ORAL | Status: AC
  Administered 2019-11-14: 5 mg via ORAL
  Filled 2019-11-14: qty 1

## 2019-11-14 MED ORDER — HEPARIN (PORCINE) 25000 UT/250ML-% IV SOLN
850.0000 [IU]/h | INTRAVENOUS | Status: DC
  Administered 2019-11-14 – 2019-11-17 (×4): 850 [IU]/h via INTRAVENOUS
  Filled 2019-11-14 (×3): qty 250

## 2019-11-14 MED ORDER — DOCUSATE SODIUM 100 MG PO CAPS
100.0000 mg | ORAL_CAPSULE | Freq: Every day | ORAL | Status: DC
  Administered 2019-11-16 – 2019-11-17 (×2): 100 mg via ORAL
  Filled 2019-11-14 (×5): qty 1

## 2019-11-14 NOTE — Progress Notes (Signed)
ANTICOAGULATION CONSULT NOTE   Pharmacy Consult for heparin and warfarin Indication: pulmonary embolus  Patient Measurements: Height: 5\' 7"  (170.2 cm) Weight: 78.5 kg (173 lb) IBW/kg (Calculated) : 61.6 HEPARIN DW (KG): 77.4  Vital Signs: Temp: 98.4 F (36.9 C) (05/17 2028) Temp Source: Oral (05/17 2028) BP: 137/49 (05/17 2028) Pulse Rate: 61 (05/17 2028)  Labs: Recent Labs    11/12/19 2159 11/13/19 0245 11/13/19 1505 11/14/19 0046 11/14/19 0846 11/14/19 1255 11/14/19 1428 11/14/19 1958  HGB 13.4  --   --   --   --   --  11.5*  --   HCT 40.6  --   --   --   --   --  34.9*  --   PLT 283  --   --   --   --   --  209  --   APTT  --  35  --   --   --   --   --   --   LABPROT  --  13.8  --   --   --   --   --   --   INR  --  1.1  --   --   --   --   --   --   HEPARINUNFRC  --   --    < > 1.09*  --  0.57  --  0.62  CREATININE 1.40*  --   --   --  1.14*  --   --   --    < > = values in this interval not displayed.    Estimated Creatinine Clearance: 36.1 mL/min (A) (by C-G formula based on SCr of 1.14 mg/dL (H)).   Medical History: Past Medical History:  Diagnosis Date  . Breast microcalcification, mammographic 01/31/2014  . 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 Declined postoperative radiation and antiestrogen therapy.  . Edema   . Hyperlipidemia   . Hypertension   . Hypothyroidism   . IBS (irritable bowel syndrome)   . Irregular heart beat   . stage III kidney disease   . Thyroid disease    s/p radioactive iodine ablation 30 years ago    Medications:  Medications Prior to Admission  Medication Sig Dispense Refill Last Dose  . aspirin EC 81 MG tablet Take 81 mg by mouth daily.   Past Month at Unknown time  . furosemide (LASIX) 20 MG tablet Take 1 tablet (20 mg total) by mouth daily. 90 tablet 2 11/12/2019 at 1000  . latanoprost (XALATAN) 0.005 % ophthalmic solution Place 1 drop into both eyes at bedtime.    11/11/2019 at  2300  . levothyroxine (EUTHYROX) 88 MCG tablet TAKE 1 TABLET BY MOUTH ONCE DAILY BEFORE BREAKFAST 90 tablet 3 11/12/2019 at 0900  . losartan (COZAAR) 50 MG tablet Take 1 tablet (50 mg total) by mouth daily. (Patient taking differently: Take 50 mg by mouth at bedtime. ) 90 tablet 2 11/11/2019 at 2300  . Multiple Vitamin (MULTIVITAMIN) tablet Take 1 tablet by mouth daily.   Past Week at Unknown time  . nebivolol (BYSTOLIC) 5 MG tablet Take 1 tablet (5 mg total) by mouth daily. 90 tablet 2 11/12/2019 at 1000    Assessment: 86 y.o. female with medical history significant for hypertension, hypothyroidism, CKD 3, history of breast cancer and recent diagnosis of endometrial cancer status post IUD placement by Dr. Gardiner Rhyme on 11/09/2019 who presents  for PE. CT angio  of the chest showed partially occlusive segmental and subsegmental bilateral PE. She has no history to guide warfarin dose.  Heparin Course: 5/16 initiation: 5000 unit bolus, then 1250 units/hr 5/16 1705 HL 1.38: dec to 1050 units/hr 5/17 0046 HL 1.09: dec to 850 units/hr 5/17 1255 HL 0.57: no change  Goal of Therapy:  Heparin level 0.3-0.7 units/ml INR 2.0 - 3.0 Monitor platelets by anticoagulation protocol: Yes   Plan:  Heparin:  Heparin level therapeutic: continue at 850 units/hr  Re-check heparin level in 6 hours  CBC in am  Continue heparin until INR therapeutic x 2  Warfarin:  Start with 5 mg po x 1  INR in am  5/17:  HL @ 1958 = 0.62 HL therapeutic X 2 ,  Will recheck HL on 5/18 with AM labs.   Donis Kotowski D 11/14/2019,8:55 PM

## 2019-11-14 NOTE — Telephone Encounter (Signed)
[name], pt's daughter is calling stating pt was seen last week and had an IUD placed. She is calling to [facility] to let her know her mom is currently in the hospital with blood clots in her lungs. The Dr's are stating a new plan for patient may need to be put in place. Nancy's number is [phone]

## 2019-11-14 NOTE — Progress Notes (Signed)
[place] for Heparin  Indication: pulmonary embolus  Allergies  Allergen Reactions  . Levaquin [Levofloxacin In D5w] Other (See Comments)    hallucinations  . Sulfa Antibiotics     Hallucinations     Patient Measurements: Height: 5\' 7"  (170.2 cm) Weight: 78.5 kg (173 lb) IBW/kg (Calculated) : 61.6 HEPARIN DW (KG): 77.4  Vital Signs: Temp: 98.2 F (36.8 C) (05/16 2341) Temp Source: Oral (05/16 2341) BP: 157/56 (05/16 2341) Pulse Rate: 68 (05/16 2341)  Labs: Recent Labs    11/12/19 2159 11/13/19 0245 11/13/19 1505 11/14/19 0046  HGB 13.4  --   --   --   HCT 40.6  --   --   --   PLT 283  --   --   --   APTT  --  35  --   --   LABPROT  --  13.8  --   --   INR  --  1.1  --   --   HEPARINUNFRC  --   --  1.38* 1.09*  CREATININE 1.40*  --   --   --     Estimated Creatinine Clearance: 29.4 mL/min (A) (by C-G formula based on SCr of 1.4 mg/dL (H)).   Medical History: Past Medical History:  Diagnosis Date  . Breast microcalcification, mammographic 01/31/2014  . 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 Declined postoperative radiation and antiestrogen therapy.  . Edema   . Hyperlipidemia   . Hypertension   . Hypothyroidism   . IBS (irritable bowel syndrome)   . Irregular heart beat   . stage III kidney disease   . Thyroid disease    s/p radioactive iodine ablation 30 years ago    Medications:  Medications Prior to Admission  Medication Sig Dispense Refill Last Dose  . aspirin EC 81 MG tablet Take 81 mg by mouth daily.   Past Month at Unknown time  . furosemide (LASIX) 20 MG tablet Take 1 tablet (20 mg total) by mouth daily. 90 tablet 2 11/12/2019 at 1000  . latanoprost (XALATAN) 0.005 % ophthalmic solution Place 1 drop into both eyes at bedtime.    11/11/2019 at 2300  . levothyroxine (EUTHYROX) 88 MCG tablet TAKE 1 TABLET BY MOUTH ONCE DAILY BEFORE BREAKFAST 90 tablet 3 11/12/2019 at  0900  . losartan (COZAAR) 50 MG tablet Take 1 tablet (50 mg total) by mouth daily. (Patient taking differently: Take 50 mg by mouth at bedtime. ) 90 tablet 2 11/11/2019 at 2300  . Multiple Vitamin (MULTIVITAMIN) tablet Take 1 tablet by mouth daily.   Past Week at Unknown time  . nebivolol (BYSTOLIC) 5 MG tablet Take 1 tablet (5 mg total) by mouth daily. 90 tablet 2 11/12/2019 at 1000    Assessment: Asked to initiate Heparin for PE.  Baseline labs ordered.  No anticoagulants per PTA med list.    Goal of Therapy:  Heparin level 0.3-0.7 units/ml Monitor platelets by anticoagulation protocol: Yes   Plan:  5/17 0046 HL 1.09, supratherapeutic. Spoke to nurse, instructed to hold heparin infusion for 1 hour, then will decrease rate to 850 units/hr when restarted. Check HL ~ 8 hours after changes.  [place] Crane, Marilea Gwynne A 11/14/2019,3:58 AM

## 2019-11-14 NOTE — Progress Notes (Signed)
PROGRESS NOTE    Whitney Simmons  [id number] DOB: 10-29-1927 DOA: 11/12/2019 PCP: Leone Haven, MD    Brief Narrative:  Whitney Manger Scroggsis a 86 y.o.femalewith medical history significant forhypertension, hypothyroidism, CKD 3, history of breast cancer and recent diagnosis of endometrial cancer status post IUD placement by Dr. Gardiner Rhyme on 11/09/2019 who presents to the emergency room with a complaint of pain and swelling and redness of the right leg and shortness of breath. She denies chest pain. Denies cough fever or chills.CT angio of the chest showed partially occlusive segmental and subsegmental bilateral PE with no evidence of right ventricular heart strain. Patient started on a heparin infusion. Hospitalist consulted for admission.     Consultants:   Oncology  Procedures: CT angio  Antimicrobials:       Subjective: Complaining of having diarrhea after taking Dulcolax.  No other complaints.  Denies worsening shortness of breath but unable to tell me if shortness of breath better  Objective: Vitals:   11/13/19 1958 11/13/19 2341 11/14/19 0431 11/14/19 0801  BP: (!) 150/55 (!) 157/56 (!) 141/46 (!) 141/73  Pulse: 73 68 68 67  Resp: (!) 21 (!) 21 20 20   Temp: 98 F (36.7 C) 98.2 F (36.8 C) 98.3 F (36.8 C) 97.9 F (36.6 C)  TempSrc: Oral Oral Oral Oral  SpO2: 94% 96% 95% 94%  Weight:      Height:        Intake/Output Summary (Last 24 hours) at 11/14/2019 0803 Last data filed at 11/14/2019 0603 Gross per 24 hour  Intake 566.46 ml  Output 1150 ml  Net -583.54 ml   Filed Weights   11/12/19 2155  Weight: 78.5 kg    Examination:  General exam: Appears calm and comfortable, nad  Respiratory system: Clear to auscultation. Respiratory effort normal. Cardiovascular system: S1 & S2 heard, RRR. No JVD, murmurs, rubs, gallops or clicks.  Gastrointestinal system: Abdomen is nondistended, soft and nontender.  Normal bowel sounds heard. Central nervous  system: Alert and oriented.  Extremities: mild edema Skin: warm, dry Psychiatry: Judgement and insight appear normal. Mood & affect appropriate.     Data Reviewed: I have personally reviewed following labs and imaging studies  CBC: Recent Labs  Lab 11/12/19 2159  WBC 10.8*  HGB 13.4  HCT 40.6  MCV 89.2  PLT [id number]   Basic Metabolic Panel: Recent Labs  Lab 11/12/19 2159  NA 139  K 4.5  CL 104  CO2 26  GLUCOSE 110*  BUN 26*  CREATININE 1.40*  CALCIUM 8.6*   GFR: Estimated Creatinine Clearance: 29.4 mL/min (A) (by C-G formula based on SCr of 1.4 mg/dL (H)). Liver Function Tests: Recent Labs  Lab 11/12/19 2159  AST 12*  ALT 11  ALKPHOS 94  BILITOT 0.7  PROT 7.6  ALBUMIN 3.8   No results for input(s): LIPASE, AMYLASE in the last 168 hours. No results for input(s): AMMONIA in the last 168 hours. Coagulation Profile: Recent Labs  Lab 11/13/19 0245  INR 1.1   Cardiac Enzymes: No results for input(s): CKTOTAL, CKMB, CKMBINDEX, TROPONINI in the last 168 hours. BNP (last 3 results) No results for input(s): PROBNP in the last 8760 hours. HbA1C: No results for input(s): HGBA1C in the last 72 hours. CBG: No results for input(s): GLUCAP in the last 168 hours. Lipid Profile: No results for input(s): CHOL, HDL, LDLCALC, TRIG, CHOLHDL, LDLDIRECT in the last 72 hours. Thyroid Function Tests: No results for input(s): TSH, T4TOTAL, FREET4, T3FREE, THYROIDAB in  the last 72 hours. Anemia Panel: No results for input(s): VITAMINB12, FOLATE, FERRITIN, TIBC, IRON, RETICCTPCT in the last 72 hours. Sepsis Labs: No results for input(s): PROCALCITON, LATICACIDVEN in the last 168 hours.  Recent Results (from the past 240 hour(s))  Urine Culture     Status: None   Collection Time: 11/10/19  4:12 PM   Specimen: Urine   UR  Result Value Ref Range Status   Urine Culture, Routine Final report  Final   Organism ID, Bacteria No growth  Final  SARS Coronavirus 2 by RT PCR (hospital  order, performed in [facility] hospital lab) Nasopharyngeal Nasopharyngeal Swab     Status: None   Collection Time: 11/13/19  4:05 AM   Specimen: Nasopharyngeal Swab  Result Value Ref Range Status   SARS Coronavirus 2 NEGATIVE NEGATIVE Final    Comment: (NOTE) SARS-CoV-2 target nucleic acids are NOT DETECTED. The SARS-CoV-2 RNA is generally detectable in upper and lower respiratory specimens during the acute phase of infection. The lowest concentration of SARS-CoV-2 viral copies this assay can detect is 250 copies / mL. A negative result does not preclude SARS-CoV-2 infection and should not be used as the sole basis for treatment or other patient management decisions.  A negative result may occur with improper specimen collection / handling, submission of specimen other than nasopharyngeal swab, presence of viral mutation(s) within the areas targeted by this assay, and inadequate number of viral copies (<250 copies / mL). A negative result must be combined with clinical observations, patient history, and epidemiological information. Fact Sheet for Patients:   StrictlyIdeas.no Fact Sheet for Healthcare Providers: BankingDealers.co.za This test is not yet approved or cleared  by the [place] FDA and has been authorized for detection and/or diagnosis of SARS-CoV-2 by FDA under an Emergency Use Authorization (EUA).  This EUA will remain in effect (meaning this test can be used) for the duration of the COVID-19 declaration under Section 564(b)(1) of the Act, 21 U.S.C. section 360bbb-3(b)(1), unless the authorization is terminated or revoked sooner. Performed at [facility], [address] [place], [place] [address]          Radiology Studies: CT Angio Chest PE W and/or Wo Contrast  Result Date: 11/13/2019 CLINICAL DATA:  Shortness of breath right leg pain IUD placed for endometrial cancer EXAM: CT ANGIOGRAPHY CHEST  WITH CONTRAST TECHNIQUE: Multidetector CT imaging of the chest was performed using the standard protocol during bolus administration of intravenous contrast. Multiplanar CT image reconstructions and MIPs were obtained to evaluate the vascular anatomy. CONTRAST:  8mL OMNIPAQUE IOHEXOL 350 MG/ML SOLN COMPARISON:  Nov 08, 2019 FINDINGS: Cardiovascular: There is a optimal opacification of the pulmonary arteries. There is partially occlusive thrombus seen at the bifurcation of the right middle lobe and right lower lobe segmental and subsegmental pulmonary arterial branches. There is partially occlusive thrombus in the posterior right lower lobe subsegmental pulmonary arteries. There is partially occlusive thrombus at the bifurcation of the left segmental upper lobe and lower lobe pulmonary arterial branches which extend into the subsegmental branches. No large central pulmonary embolism is seen. There is moderate cardiomegaly. No evidence of right ventricular heart strain. There is normal three-vessel brachiocephalic anatomy without proximal stenosis. The thoracic aorta is normal in appearance. Scattered aortic atherosclerosis. Mediastinum/Nodes: No hilar, mediastinal, or axillary adenopathy. Thyroid gland, trachea, and esophagus demonstrate no significant findings. Lungs/Pleura: The lungs are clear. No pleural effusion or pneumothorax. No airspace consolidation. Upper Abdomen: No acute abnormalities present in the visualized  portions of the upper abdomen. Musculoskeletal: No chest wall abnormality. No acute or significant osseous findings. Review of the MIP images confirms the above findings. IMPRESSION: Partially occlusive segmental and subsegmental bilateral pulmonary emboli as described above within the right middle lobe, right lower lobe, left upper lobe and left lower lobes. No evidence of right ventricular heart strain. Aortic Atherosclerosis (ICD10-I70.0). These results were called by telephone at the time of  interpretation on 11/13/2019 at 2:12 am to provider [facility] , who verbally acknowledged these results. Electronically Signed   By: Prudencio Pair M.D.   On: 11/13/2019 02:13   US Venous Img Lower Unilateral Right (DVT)  Result Date: 11/12/2019 CLINICAL DATA:  Right leg swelling and pain EXAM: Right LOWER EXTREMITY VENOUS DOPPLER ULTRASOUND TECHNIQUE: Gray-scale sonography with compression, as well as color and duplex ultrasound, were performed to evaluate the deep venous system(s) from the level of the common femoral vein through the popliteal and proximal calf veins. COMPARISON:  None. FINDINGS: VENOUS Normal compressibility of the common femoral, superficial femoral, and popliteal veins, as well as the visualized calf veins. Visualized portions of profunda femoral vein are unremarkable. Nonocclusive thrombus within the greater saphenous vein below the knee. Vein is noncompressible. No filling defects to suggest DVT on grayscale or color Doppler imaging. Doppler waveforms show normal direction of venous flow, normal respiratory plasticity and response to augmentation. Limited views of the contralateral common femoral vein are unremarkable. OTHER None. Limitations: none IMPRESSION: 1. Negative for acute deep venous thrombosis of the right lower extremity 2. Nonocclusive thrombus/superficial thrombophlebitis of the greater saphenous vein below the level of the knee. Electronically Signed   By: Donavan Foil M.D.   On: 11/12/2019 23:14        Scheduled Meds: . docusate sodium  100 mg Oral Daily  . latanoprost  1 drop Both Eyes QHS  . levothyroxine  88 mcg Oral Q0600  . losartan  50 mg Oral Daily  . warfarin  5 mg Oral Daily   Continuous Infusions: . heparin 850 Units/hr (11/14/19 0603)    Assessment & Plan:   Principal Problem:   Pulmonary embolism (HCC) Active Problems:   Hypothyroidism   Hypertension   Chronic kidney disease, stage III (moderate)   History of breast cancer    Endometrial cancer, grade I (HCC)   Superficial thrombophlebitis of right leg   IUD (intrauterine device) in place   Acute pulmonary embolism ([place])   Venous embolism and thrombosis of superficial vessels of left lower extremity   Bilateral pulmonary embolism (HCC)   Superficial thrombophlebitis of right leg -Patient presents with shortness of breath and swelling right lower extremity 3 days after IUD placement (11/09/19) by gynecologist for endometrial CA -Right lower extremity Doppler showing thrombophlebitis of greater saphenous distal to the knee -CTA chest showing bilateral segmental and subsegmental PEs Oncology consulted, input was appreciated-Unclear if developed DVT and/or PE after IUD placement or was developing thrombosis prior to symptomatology. Thrombosis likely secondary to malignancy, age, and possibly Mirena IUD.             Limited hypercoagulable work-up. Recommended Coumadin with bridging with heparin gtt Start coumadin 5mg  qhs, pharmacy consult for dosing Needs follow-up with oncology as outpatient Megace contraindicated (higher risk of thrombosis).  Endometrial CA Status post IUD (intrauterine device) placement on 11/09/2019, had declined surgery -Followed by Dr. Nechama Guard     Hypothyroidism -Continue home levothyroxine pending med rec    Hypertension -Continue home meds    Chronic kidney  disease, stage III (moderate) -Stable at baseline    History of breast cancer -No acute concerns   DVT prophylaxis: heparin gtt Code Status:full Family Communication: none at bedside Disposition Plan: back to previous home life Barrier: Needs INR to be therapeutic, on heparin drip with Coumadin start for PE likely in couple of days can be discharged if INR is therapeutic       LOS: 1 day   Time spent: 45 minutes with more than 50% on [place], MD [facility] Pager [phone]  If 7PM-7AM, please contact  night-coverage [email or web address] Password [facility] 11/14/2019, 8:03 AM

## 2019-11-14 NOTE — Telephone Encounter (Signed)
Called patient's daughter. She did not answer. Left message.

## 2019-11-14 NOTE — Progress Notes (Signed)
[place] for heparin and warfarin Indication: pulmonary embolus  Patient Measurements: Height: 5\' 7"  (170.2 cm) Weight: 78.5 kg (173 lb) IBW/kg (Calculated) : 61.6 HEPARIN DW (KG): 77.4  Vital Signs: Temp: 97.9 F (36.6 C) (05/17 0801) Temp Source: Oral (05/17 0801) BP: 141/73 (05/17 0801) Pulse Rate: 67 (05/17 0801)  Labs: Recent Labs    11/12/19 2159 11/13/19 0245 11/13/19 1505 11/14/19 0046  HGB 13.4  --   --   --   HCT 40.6  --   --   --   PLT 283  --   --   --   APTT  --  35  --   --   LABPROT  --  13.8  --   --   INR  --  1.1  --   --   HEPARINUNFRC  --   --  1.38* 1.09*  CREATININE 1.40*  --   --   --     Estimated Creatinine Clearance: 29.4 mL/min (A) (by C-G formula based on SCr of 1.4 mg/dL (H)).   Medical History: Past Medical History:  Diagnosis Date  . Breast microcalcification, mammographic 01/31/2014  . 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 Declined postoperative radiation and antiestrogen therapy.  . Edema   . Hyperlipidemia   . Hypertension   . Hypothyroidism   . IBS (irritable bowel syndrome)   . Irregular heart beat   . stage III kidney disease   . Thyroid disease    s/p radioactive iodine ablation 30 years ago    Medications:  Medications Prior to Admission  Medication Sig Dispense Refill Last Dose  . aspirin EC 81 MG tablet Take 81 mg by mouth daily.   Past Month at Unknown time  . furosemide (LASIX) 20 MG tablet Take 1 tablet (20 mg total) by mouth daily. 90 tablet 2 11/12/2019 at 1000  . latanoprost (XALATAN) 0.005 % ophthalmic solution Place 1 drop into both eyes at bedtime.    11/11/2019 at 2300  . levothyroxine (EUTHYROX) 88 MCG tablet TAKE 1 TABLET BY MOUTH ONCE DAILY BEFORE BREAKFAST 90 tablet 3 11/12/2019 at 0900  . losartan (COZAAR) 50 MG tablet Take 1 tablet (50 mg total) by mouth daily. (Patient taking differently: Take 50 mg by mouth at bedtime. )  90 tablet 2 11/11/2019 at 2300  . Multiple Vitamin (MULTIVITAMIN) tablet Take 1 tablet by mouth daily.   Past Week at Unknown time  . nebivolol (BYSTOLIC) 5 MG tablet Take 1 tablet (5 mg total) by mouth daily. 90 tablet 2 11/12/2019 at 1000    Assessment: 86 y.o. female with medical history significant for hypertension, hypothyroidism, CKD 3, history of breast cancer and recent diagnosis of endometrial cancer status post IUD placement by Dr. Gardiner Rhyme on 11/09/2019 who presents  for PE. CT angio of the chest showed partially occlusive segmental and subsegmental bilateral PE. She has no history to guide warfarin dose.  Heparin Course: 5/16 initiation: 5000 unit bolus, then 1250 units/hr 5/16 1705 HL 1.38: dec to 1050 units/hr 5/17 0046 HL 1.09: dec to 850 units/hr 5/17 1255 HL 0.57: no change  Goal of Therapy:  Heparin level 0.3-0.7 units/ml INR 2.0 - 3.0 Monitor platelets by anticoagulation protocol: Yes   Plan:  Heparin:  Heparin level therapeutic: continue at 850 units/hr  Re-check heparin level in 6 hours  CBC in am  Continue heparin until INR therapeutic x 2  Warfarin:  Start with 5 mg po x 1  INR in am  Dallie Piles 11/14/2019,8:19 AM

## 2019-11-14 NOTE — Progress Notes (Signed)
[facility] Hematology/Oncology Progress Note  Date of admission: 11/12/2019  Hospital day:  11/14/2019  Chief Complaint: Whitney Simmons is a 86 y.o. female with grade I endometrial cancer who was admitted through the emergency room with pulmonary emboli.  Subjective:  Feeling better.  Less shortness of breath.  Right lower extremity with decreased tenderness and redness.  Diarrhea.  Social History: The patient is alone today.  Allergies:  Allergies  Allergen Reactions  . Levaquin [Levofloxacin In D5w] Other (See Comments)    hallucinations  . Sulfa Antibiotics     Hallucinations     Scheduled Medications: . docusate sodium  100 mg Oral Daily  . latanoprost  1 drop Both Eyes QHS  . levothyroxine  88 mcg Oral Q0600  . losartan  50 mg Oral Daily  . nebivolol  5 mg Oral Daily    Review of Systems: GENERAL:  Feels better.  No fevers, sweats or weight loss. PERFORMANCE STATUS (ECOG):  1 HEENT:  No visual changes, runny nose, sore throat, mouth sores or tenderness. Lungs: Shortness of breath, improved.  No cough.  No hemoptysis. Cardiac:  No chest pain, palpitations, orthopnea, or PND. GI:  No nausea, vomiting, diarrhea, constipation, melena or hematochezia. GU:  Stage III chronic kidney disease.  No urgency, frequency, dysuria, or hematuria. Musculoskeletal:  No back pain.  No joint pain.  No muscle tenderness. Extremities:  Right lower extremity swelling, improved. Skin:  No rashes or skin changes. Neuro:  No headache, numbness or weakness, balance or coordination issues. Endocrine:  Thyroid disease.  No hot flashes or night sweats. Psych:  Anxiety/stress.  Concerned about her husband.  Pain:  No focal pain. Review of systems:  All other systems reviewed and found to be negative.  Physical Exam: Blood pressure (!) 137/49, pulse 61, temperature 98.4 F (36.9 C), temperature source Oral, resp. rate 19, height 5\' 7"  (1.702 m), weight 173 lb (78.5 kg),  SpO2 96 %.  GENERAL:  Well developed, well nourished, woman sitting comfortably on the medical unit in no acute distress. MENTAL STATUS:  Alert and oriented to person, place and time. HEAD:  Short styled gray hair.  Normocephalic, atraumatic, face symmetric, no Cushingoid features. EYES:  Blue eyes.  Pupils equal round and reactive to light and accomodation.  No conjunctivitis or scleral icterus. ENT:  Oropharynx clear without lesion.  Tongue normal. Mucous membranes moist.  RESPIRATORY:  Clear to auscultation without rales, wheezes or rhonchi. CARDIOVASCULAR:  Regular rate and rhythm without murmur, rub or gallop. ABDOMEN:  Soft, non-tender, with active bowel sounds, and no hepatosplenomegaly.  No masses. SKIN:  No rashes, ulcers or lesions. EXTREMITIES: Right lower extremity edema, improved.  Medial right distal lower extremity with focal edema and mild pinkness without erythema or increased warmth.   NEUROLOGICAL: Unremarkable. PSYCH:  Appropriate.   Results for orders placed or performed during the hospital encounter of 11/12/19 (from the past 48 hour(s))  CBC     Status: Abnormal   Collection Time: 11/12/19  9:59 PM  Result Value Ref Range   WBC 10.8 (H) 4.0 - 10.5 K/uL   RBC 4.55 3.87 - 5.11 MIL/uL   Hemoglobin 13.4 12.0 - 15.0 g/dL   HCT 40.6 36.0 - 46.0 %   MCV 89.2 80.0 - 100.0 fL   MCH 29.5 26.0 - 34.0 pg   MCHC 33.0 30.0 - 36.0 g/dL   RDW 13.2 11.5 - 15.5 %   Platelets 283 150 - 400 K/uL  nRBC 0.0 0.0 - 0.2 %    Comment: Performed at [facility], [address] [place], [place] [address]  Comprehensive metabolic panel     Status: Abnormal   Collection Time: 11/12/19  9:59 PM  Result Value Ref Range   Sodium 139 135 - 145 mmol/L   Potassium 4.5 3.5 - 5.1 mmol/L   Chloride 104 98 - 111 mmol/L   CO2 26 22 - 32 mmol/L   Glucose, Bld 110 (H) 70 - 99 mg/dL    Comment: Glucose reference range applies only to samples taken after fasting for at least 8 hours.    BUN 26 (H) 8 - 23 mg/dL   Creatinine, Ser 1.40 (H) 0.44 - 1.00 mg/dL   Calcium 8.6 (L) 8.9 - 10.3 mg/dL   Total Protein 7.6 6.5 - 8.1 g/dL   Albumin 3.8 3.5 - 5.0 g/dL   AST 12 (L) 15 - 41 U/L   ALT 11 0 - 44 U/L   Alkaline Phosphatase 94 38 - 126 U/L   Total Bilirubin 0.7 0.3 - 1.2 mg/dL   GFR calc non Af Amer 33 (L) >60 mL/min   GFR calc Af Amer 39 (L) >60 mL/min   Anion gap 9 5 - 15    Comment: Performed at [facility], [address] [place], [place] [address]  APTT     Status: None   Collection Time: 11/13/19  2:45 AM  Result Value Ref Range   aPTT 35 24 - 36 seconds    Comment: Performed at [facility], [address] [place], [place] [address]  Protime-INR     Status: None   Collection Time: 11/13/19  2:45 AM  Result Value Ref Range   Prothrombin Time 13.8 11.4 - 15.2 seconds   INR 1.1 0.8 - 1.2    Comment: (NOTE) INR goal varies based on device and disease states. Performed at [facility], [address] [place], [address] [place],  [address]   SARS Coronavirus 2 by RT PCR (hospital order, performed in [facility] hospital lab) Nasopharyngeal Nasopharyngeal Swab     Status: None   Collection Time: 11/13/19  4:05 AM   Specimen: Nasopharyngeal Swab  Result Value Ref Range   SARS Coronavirus 2 NEGATIVE NEGATIVE    Comment: (NOTE) SARS-CoV-2 target nucleic acids are NOT DETECTED. The SARS-CoV-2 RNA is generally detectable in upper and lower respiratory specimens during the acute phase of infection. The lowest concentration of SARS-CoV-2 viral copies this assay can detect is 250 copies / mL. A negative result does not preclude SARS-CoV-2 infection and should not be used as the sole basis for treatment or other patient management decisions.  A negative result may occur with improper specimen collection / handling, submission of specimen other than nasopharyngeal swab, presence of viral mutation(s) within the areas targeted by this assay, and  inadequate number of viral copies (<250 copies / mL). A negative result must be combined with clinical observations, patient history, and epidemiological information. Fact Sheet for Patients:   StrictlyIdeas.no Fact Sheet for Healthcare Providers: BankingDealers.co.za This test is not yet approved or cleared  by the [place] FDA and has been authorized for detection and/or diagnosis of SARS-CoV-2 by FDA under an Emergency Use Authorization (EUA).  This EUA will remain in effect (meaning this test can be used) for the duration of the COVID-19 declaration under Section 564(b)(1) of the Act, 21 U.S.C. section 360bbb-3(b)(1), unless the authorization is terminated or revoked sooner. Performed at  [facility], [place], [place] [address]   Heparin level (unfractionated)     Status: Abnormal   Collection Time: 11/13/19  3:05 PM  Result Value Ref Range   Heparin Unfractionated 1.38 (H) 0.30 - 0.70 IU/mL    Comment: (NOTE) If heparin results are below expected values, and patient dosage has  been confirmed, suggest follow up testing of antithrombin III levels. Performed at [facility], [place], [place] [address]   Heparin level (unfractionated)     Status: Abnormal   Collection Time: 11/14/19 12:46 AM  Result Value Ref Range   Heparin Unfractionated 1.09 (H) 0.30 - 0.70 IU/mL    Comment: (NOTE) If heparin results are below expected values, and patient dosage has  been confirmed, suggest follow up testing of antithrombin III levels. Performed at [facility], [address] [place], [place] [id number]   Basic metabolic panel     Status: Abnormal   Collection Time: 11/14/19  8:46 AM  Result Value Ref Range   Sodium 139 135 - 145 mmol/L   Potassium 4.3 3.5 - 5.1 mmol/L   Chloride 107 98 - 111 mmol/L   CO2 22 22 - 32 mmol/L   Glucose, Bld 127 (H) 70 - 99 mg/dL    Comment:  Glucose reference range applies only to samples taken after fasting for at least 8 hours.   BUN 21 8 - 23 mg/dL   Creatinine, Ser 1.14 (H) 0.44 - 1.00 mg/dL   Calcium 8.7 (L) 8.9 - 10.3 mg/dL   GFR calc non Af Amer 43 (L) >60 mL/min   GFR calc Af Amer 49 (L) >60 mL/min   Anion gap 10 5 - 15    Comment: Performed at [facility], [place], [place] [address]  Heparin level (unfractionated)     Status: None   Collection Time: 11/14/19 12:55 PM  Result Value Ref Range   Heparin Unfractionated 0.57 0.30 - 0.70 IU/mL    Comment: (NOTE) If heparin results are below expected values, and patient dosage has  been confirmed, suggest follow up testing of antithrombin III levels. Performed at [facility], [address] [place], [place] [address]   CBC     Status: Abnormal   Collection Time: 11/14/19  2:28 PM  Result Value Ref Range   WBC 8.9 4.0 - 10.5 K/uL   RBC 3.97 3.87 - 5.11 MIL/uL   Hemoglobin 11.5 (L) 12.0 - 15.0 g/dL   HCT 34.9 (L) 36.0 - 46.0 %   MCV 87.9 80.0 - 100.0 fL   MCH 29.0 26.0 - 34.0 pg   MCHC 33.0 30.0 - 36.0 g/dL   RDW 13.3 11.5 - 15.5 %   Platelets 209 150 - 400 K/uL   nRBC 0.0 0.0 - 0.2 %    Comment: Performed at [facility], [address] [place], [place] [address]  Heparin level (unfractionated)     Status: None   Collection Time: 11/14/19  7:58 PM  Result Value Ref Range   Heparin Unfractionated 0.62 0.30 - 0.70 IU/mL    Comment: (NOTE) If heparin results are below expected values, and patient dosage has  been confirmed, suggest follow up testing of antithrombin III levels. Performed at [facility], [place], [place] [address]    CT Angio Chest PE W and/or Wo Contrast  Result Date: 11/13/2019 CLINICAL DATA:  Shortness of breath right leg pain IUD placed  for endometrial cancer EXAM: CT ANGIOGRAPHY CHEST WITH CONTRAST TECHNIQUE: Multidetector CT imaging of the chest was performed using  the standard protocol during bolus administration of intravenous contrast. Multiplanar CT image reconstructions and MIPs were obtained to evaluate the vascular anatomy. CONTRAST:  81mL OMNIPAQUE IOHEXOL 350 MG/ML SOLN COMPARISON:  Nov 08, 2019 FINDINGS: Cardiovascular: There is a optimal opacification of the pulmonary arteries. There is partially occlusive thrombus seen at the bifurcation of the right middle lobe and right lower lobe segmental and subsegmental pulmonary arterial branches. There is partially occlusive thrombus in the posterior right lower lobe subsegmental pulmonary arteries. There is partially occlusive thrombus at the bifurcation of the left segmental upper lobe and lower lobe pulmonary arterial branches which extend into the subsegmental branches. No large central pulmonary embolism is seen. There is moderate cardiomegaly. No evidence of right ventricular heart strain. There is normal three-vessel brachiocephalic anatomy without proximal stenosis. The thoracic aorta is normal in appearance. Scattered aortic atherosclerosis. Mediastinum/Nodes: No hilar, mediastinal, or axillary adenopathy. Thyroid gland, trachea, and esophagus demonstrate no significant findings. Lungs/Pleura: The lungs are clear. No pleural effusion or pneumothorax. No airspace consolidation. Upper Abdomen: No acute abnormalities present in the visualized portions of the upper abdomen. Musculoskeletal: No chest wall abnormality. No acute or significant osseous findings. Review of the MIP images confirms the above findings. IMPRESSION: Partially occlusive segmental and subsegmental bilateral pulmonary emboli as described above within the right middle lobe, right lower lobe, left upper lobe and left lower lobes. No evidence of right ventricular heart strain. Aortic Atherosclerosis (ICD10-I70.0). These results were called by telephone at the time of interpretation on 11/13/2019 at 2:12 am to provider [facility] , who verbally  acknowledged these results. Electronically Signed   By: Prudencio Pair M.D.   On: 11/13/2019 02:13   US Venous Img Lower Unilateral Right (DVT)  Result Date: 11/12/2019 CLINICAL DATA:  Right leg swelling and pain EXAM: Right LOWER EXTREMITY VENOUS DOPPLER ULTRASOUND TECHNIQUE: Gray-scale sonography with compression, as well as color and duplex ultrasound, were performed to evaluate the deep venous system(s) from the level of the common femoral vein through the popliteal and proximal calf veins. COMPARISON:  None. FINDINGS: VENOUS Normal compressibility of the common femoral, superficial femoral, and popliteal veins, as well as the visualized calf veins. Visualized portions of profunda femoral vein are unremarkable. Nonocclusive thrombus within the greater saphenous vein below the knee. Vein is noncompressible. No filling defects to suggest DVT on grayscale or color Doppler imaging. Doppler waveforms show normal direction of venous flow, normal respiratory plasticity and response to augmentation. Limited views of the contralateral common femoral vein are unremarkable. OTHER None. Limitations: none IMPRESSION: 1. Negative for acute deep venous thrombosis of the right lower extremity 2. Nonocclusive thrombus/superficial thrombophlebitis of the greater saphenous vein below the level of the knee. Electronically Signed   By: Donavan Foil M.D.   On: 11/12/2019 23:14    Assessment:  Whitney Simmons is a 86 y.o. female with localized endometrial cancer admitted with right lower extremity thrombosis and bilateral pulmonary emboli.  Chest, abdomen, and pelvis CT on 11/08/2019 revealed no evidence of metastatic disease.  Mirena IUD was placed on 11/09/2019.  Right lower extremity duplex on 11/12/2019 revealed a nonocclusive thrombus/superficial thrombophlebitis of the greater saphenous vein below the level of the knee.  Chest CT angiogram on 11/13/2019 revealed a partially occlusive segmental and subsegmental  bilateral pulmonary emboli within the right middle lobe, right lower lobe, left upper lobe and left lower  lobes.  There was no evidence of right ventricular heart strain.    She has chronic renal insufficiency.  Creatinine was 1.40 (CrCl 29.4 ml/min).  Symptomatically, she is feeling better today.  Shortness of breath and right lower extremity edema have improved.    Plan:   1.   Endometrial cancer             Patient with localized disease.             She declined surgery and opted for a Mirena IUD (placed 11/09/2019).             Mirena IUD can be associated with thrombosis, but typically very little systemic absorption.             Megace contraindicated (higher risk of thrombosis).             Surgery and radiation was previously discussed with patient.             Spoke with Dr Theora Gianotti.  Plan for IUD to remain in place.  Follow-up with Dr Theora Gianotti in the outpatient department. 2.   Bilateral pulmonary emboli and right superficial thrombophlebitis             Patient acutely developed symptoms in the past 2-3 days.             Etiology of thrombosis likely multi-factorial: age, malignancy, decreased mobility.             Patient currently on heparin.             As CrCl < 30 m/min, plan for long term Coumadin.   Factors which can affect INR discussed with patient (diet, diarrhea, antibiotics).             Continue heparin bridge (follow PTT) until Coumadin therapeutic.              INR was 1.1 on 11/13/2019.              Limited hypercoagulable work-up pending.   Lequita Asal, MD  11/14/2019, 9:20 PM

## 2019-11-14 NOTE — Progress Notes (Signed)
Tumor Board Documentation  Whitney Simmons was presented by Mariea Clonts RN,  at our Tumor Board on 11/09/2019, which included representatives from medical oncology, radiation oncology, surgical oncology, pathology, pharmacy, navigation.  Whitney Simmons currently presents as a current patient, for discussion, for [name] with endometrial  adenocarcinoma, endometrioid type, FIGO grade 1, history of the following treatments: none.  Additionally, we reviewed previous medical and familial history, history of present illness, and recent lab results along with all available histopathologic and imaging studies. The tumor board considered available treatment options and made the following recommendations: Hormonal therapy, Surgery, Radiation therapy (primary modality) Mirena IUD with repeat biopsy in 3-4 months  The following procedures/referrals were also placed: No orders of the defined types were placed in this encounter.   Clinical Trial Status: not discussed   Staging used:   AJCC Staging:          National site-specific guidelines   were discussed with respect to the case.  Tumor board is a meeting of clinicians from various specialty areas who evaluate and discuss patients for whom a multidisciplinary approach is being considered. Final determinations in the plan of care are those of the provider(s). The responsibility for follow up of recommendations given during tumor board is that of the provider.   Today's extended care, comprehensive team conference, Whitney Simmons was not present for the discussion and was not examined.   Multidisciplinary Tumor Board is a multidisciplinary case peer review process.  Decisions discussed in the Multidisciplinary Tumor Board reflect the opinions of the specialists present at the conference without having examined the patient.  Ultimately, treatment and diagnostic decisions rest with the primary provider(s) and the patient.

## 2019-11-15 LAB — CBC
HCT: 34.8 % — ABNORMAL LOW (ref 36.0–46.0)
Hemoglobin: 11.3 g/dL — ABNORMAL LOW (ref 12.0–15.0)
MCH: 29.7 pg (ref 26.0–34.0)
MCHC: 32.5 g/dL (ref 30.0–36.0)
MCV: 91.3 fL (ref 80.0–100.0)
Platelets: 241 10*3/uL (ref 150–400)
RBC: 3.81 MIL/uL — ABNORMAL LOW (ref 3.87–5.11)
RDW: 13.2 % (ref 11.5–15.5)
WBC: 10.9 10*3/uL — ABNORMAL HIGH (ref 4.0–10.5)
nRBC: 0 % (ref 0.0–0.2)

## 2019-11-15 LAB — CARDIOLIPIN ANTIBODIES, IGG, IGM, IGA
Anticardiolipin IgA: <9 APL U/mL (ref 0–11)
Anticardiolipin IgG: <9 GPL U/mL (ref 0–14)
Anticardiolipin IgM: <9 MPL U/mL (ref 0–12)

## 2019-11-15 LAB — PROTIME-INR
INR: 1.1 (ref 0.8–1.2)
Prothrombin Time: 13.8 seconds (ref 11.4–15.2)

## 2019-11-15 LAB — HEPARIN LEVEL (UNFRACTIONATED): Heparin Unfractionated: 0.54 IU/mL (ref 0.30–0.70)

## 2019-11-15 LAB — BETA-2-GLYCOPROTEIN I ABS, IGG/M/A
Beta-2 Glyco I IgG: <9 GPI IgG units (ref 0–20)
Beta-2-Glycoprotein I IgA: 11 GPI IgA units (ref 0–25)
Beta-2-Glycoprotein I IgM: <9 GPI IgM units (ref 0–32)

## 2019-11-15 MED ORDER — WARFARIN SODIUM 5 MG PO TABS
5.0000 mg | ORAL_TABLET | Freq: Once | ORAL | Status: AC
  Administered 2019-11-15: 5 mg via ORAL
  Filled 2019-11-15: qty 1

## 2019-11-15 MED ORDER — WARFARIN - PHARMACIST DOSING INPATIENT: Freq: Every day | Status: DC

## 2019-11-15 NOTE — Progress Notes (Signed)
ANTICOAGULATION CONSULT NOTE   Pharmacy Consult for heparin and warfarin Indication: pulmonary embolus  Patient Measurements: Height: 5\' 7"  (170.2 cm) Weight: 78.5 kg (173 lb) IBW/kg (Calculated) : 61.6 HEPARIN DW (KG): 77.4  Vital Signs: Temp: 97.9 F (36.6 C) (05/18 0411) Temp Source: Oral (05/17 2028) BP: 161/55 (05/18 0411) Pulse Rate: 80 (05/18 0411)  Labs: Recent Labs    11/12/19 2159 11/12/19 2159 11/13/19 0245 11/13/19 1505 11/14/19 0046 11/14/19 0846 11/14/19 1255 11/14/19 1428 11/14/19 1958 11/15/19 0452  HGB 13.4   < >  --   --   --   --   --  11.5*  --  11.3*  HCT 40.6  --   --   --   --   --   --  34.9*  --  34.8*  PLT 283  --   --   --   --   --   --  209  --  241  APTT  --   --  35  --   --   --   --   --   --   --   LABPROT  --   --  13.8  --   --   --   --   --   --  13.8  INR  --   --  1.1  --   --   --   --   --   --  1.1  HEPARINUNFRC  --   --   --    < >   < >  --  0.57  --  0.62 0.54  CREATININE 1.40*  --   --   --   --  1.14*  --   --   --   --    < > = values in this interval not displayed.    Estimated Creatinine Clearance: 36.1 mL/min (A) (by C-G formula based on SCr of 1.14 mg/dL (H)).   Medical History: Past Medical History:  Diagnosis Date  . Breast microcalcification, mammographic 01/31/2014  . 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 Declined postoperative radiation and antiestrogen therapy.  . Edema   . Hyperlipidemia   . Hypertension   . Hypothyroidism   . IBS (irritable bowel syndrome)   . Irregular heart beat   . stage III kidney disease   . Thyroid disease    s/p radioactive iodine ablation 30 years ago    Medications:  Medications Prior to Admission  Medication Sig Dispense Refill Last Dose  . aspirin EC 81 MG tablet Take 81 mg by mouth daily.   Past Month at Unknown time  . furosemide (LASIX) 20 MG tablet Take 1 tablet (20 mg total) by mouth daily. 90 tablet 2 11/12/2019 at  1000  . latanoprost (XALATAN) 0.005 % ophthalmic solution Place 1 drop into both eyes at bedtime.    11/11/2019 at 2300  . levothyroxine (EUTHYROX) 88 MCG tablet TAKE 1 TABLET BY MOUTH ONCE DAILY BEFORE BREAKFAST 90 tablet 3 11/12/2019 at 0900  . losartan (COZAAR) 50 MG tablet Take 1 tablet (50 mg total) by mouth daily. (Patient taking differently: Take 50 mg by mouth at bedtime. ) 90 tablet 2 11/11/2019 at 2300  . Multiple Vitamin (MULTIVITAMIN) tablet Take 1 tablet by mouth daily.   Past Week at Unknown time  . nebivolol (BYSTOLIC) 5 MG tablet Take 1 tablet (5 mg total) by mouth daily. 90 tablet 2 11/12/2019  at 1000    Assessment: 86 y.o. female with medical history significant for hypertension, hypothyroidism, CKD 3, history of breast cancer and recent diagnosis of endometrial cancer status post IUD placement by Dr. Gardiner Rhyme on 11/09/2019 who presents  for PE. CT angio of the chest showed partially occlusive segmental and subsegmental bilateral PE. She has no history to guide warfarin dose.  Heparin Course: 5/16 initiation: 5000 unit bolus, then 1250 units/hr 5/16 1705 HL 1.38: dec to 1050 units/hr 5/17 0046 HL 1.09: dec to 850 units/hr 5/17 1255 HL 0.57: no change  Goal of Therapy:  Heparin level 0.3-0.7 units/ml INR 2.0 - 3.0 Monitor platelets by anticoagulation protocol: Yes   Plan:  Heparin: 05/18 @ 0500 HL 0.54 therapeutic. Will continue current rate and will recheck HL w/ am labs, CBC stable will continue to monitor.  Tobie Lords, PharmD, BCPS Clinical Pharmacist 11/15/2019,5:24 AM

## 2019-11-15 NOTE — Progress Notes (Signed)
PROGRESS NOTE    Whitney Simmons  [id number] DOB: [birth year] DOA: 11/12/2019 PCP: Whitney Haven, MD    Brief Narrative:  Whitney Simmons. Denies cough fever or chills.CT angio of the chest showed partially occlusive segmental and subsegmental bilateral PE with no evidence of right ventricular heart strain. Patient started on a heparin infusion. Hospitalist consulted for admission.     Consultants:   Oncology  Procedures: CT angio  Antimicrobials:       Subjective: Diarrhea stopped.  Has no shortness of breath.  Has no complaints.  Objective: Vitals:   11/14/19 1221 11/14/19 1344 11/14/19 2028 11/15/19 0411  BP: (!) 133/57 (!) 133/53 (!) 137/49 (!) 161/55  Pulse: 64 73 61 80  Resp:  18 19 20   Temp:  97.7 F (36.5 C) 98.4 F (36.9 C) 97.9 F (36.6 C)  TempSrc:  Oral Oral   SpO2:  96% 96% 96%  Weight:      Height:        Intake/Output Summary (Last 24 hours) at 11/15/2019 1300 Last data filed at 11/15/2019 0900 Gross per 24 hour  Intake 698.06 ml  Output --  Net 698.06 ml   Filed Weights   11/12/19 2155  Weight: 78.5 kg    Examination:  General exam: Appears calm and comfortable, nad  Respiratory system: Clear to auscultation. Respiratory effort normal.  No wheezing or rhonchi's Cardiovascular system: S1 & S2 heard, RRR. No JVD, murmurs, rubs, gallops or clicks.  Gastrointestinal system: Abdomen is nondistended, soft and nontender.  Normal bowel sounds heard. Central nervous system: Alert and oriented.  Loosely intact Extremities: mild edema, mild erythema at the medial aspect of right  mid shin Skin: warm, dry Psychiatry: Judgement and insight appear normal. Mood & affect appropriate.     Data Reviewed: I have personally reviewed following labs and imaging studies  CBC: Recent Labs  Lab 11/12/19 2159 11/14/19 1428 11/15/19 0452  WBC 10.8* 8.9 10.9*  HGB 13.4 11.5* 11.3*  HCT 40.6 34.9* 34.8*  MCV 89.2 87.9 91.3  PLT 283 209 [id number]   Basic Metabolic Panel: Recent Labs  Lab 11/12/19 2159 11/14/19 0846  NA 139 139  K 4.5 4.3  CL 104 107  CO2 26 22  GLUCOSE 110* 127*  BUN 26* 21  CREATININE 1.40* 1.14*  CALCIUM 8.6* 8.7*   GFR: Estimated Creatinine Clearance: 36.1 mL/min (A) (by C-G formula based on SCr of 1.14 mg/dL (H)). Liver Function Tests: Recent Labs  Lab 11/12/19 2159  AST 12*  ALT 11  ALKPHOS 94  BILITOT 0.7  PROT 7.6  ALBUMIN 3.8   No results for input(s): LIPASE, AMYLASE in the last 168 hours. No results for input(s): AMMONIA in the last 168 hours. Coagulation Profile: Recent Labs  Lab 11/13/19 0245 11/15/19 0452  INR 1.1 1.1   Cardiac Enzymes: No results for input(s): CKTOTAL, CKMB, CKMBINDEX, TROPONINI in the last 168 hours. BNP (last 3 results) No results for input(s): PROBNP in the last 8760 hours. HbA1C: No results for input(s): HGBA1C in the last 72 hours. CBG: No results for input(s): GLUCAP in the last 168 hours. Lipid Profile: No results for input(s): CHOL,  HDL, LDLCALC, TRIG, CHOLHDL, LDLDIRECT in the last 72 hours. Thyroid Function Tests: No results for input(s): TSH, T4TOTAL, FREET4, T3FREE, THYROIDAB in the last 72 hours. Anemia Panel: No results for input(s): VITAMINB12, FOLATE, FERRITIN, TIBC, IRON, RETICCTPCT in the last 72 hours. Sepsis Labs: No results for input(s): PROCALCITON, LATICACIDVEN in the last 168 hours.  Recent Results (from the past 240 hour(s))  Urine Culture     Status: None   Collection Time: 11/10/19  4:12 PM   Specimen: Urine   UR  Result Value Ref Range Status   Urine Culture,  Routine Final report  Final   Organism ID, Bacteria No growth  Final  SARS Coronavirus 2 by RT PCR (hospital order, performed in [facility] hospital lab) Nasopharyngeal Nasopharyngeal Swab     Status: None   Collection Time: 11/13/19  4:05 AM   Specimen: Nasopharyngeal Swab  Result Value Ref Range Status   SARS Coronavirus 2 NEGATIVE NEGATIVE Final    Comment: (NOTE) SARS-CoV-2 target nucleic acids are NOT DETECTED. The SARS-CoV-2 RNA is generally detectable in upper and lower respiratory specimens during the acute phase of infection. The lowest concentration of SARS-CoV-2 viral copies this assay can detect is 250 copies / mL. A negative result does not preclude SARS-CoV-2 infection and should not be used as the sole basis for treatment or other patient management decisions.  A negative result may occur with improper specimen collection / handling, submission of specimen other than nasopharyngeal swab, presence of viral mutation(s) within the areas targeted by this assay, and inadequate number of viral copies (<250 copies / mL). A negative result must be combined with clinical observations, patient history, and epidemiological information. Fact Sheet for Patients:   StrictlyIdeas.no Fact Sheet for Healthcare Providers: BankingDealers.co.za This test is not yet approved or cleared  by the [place] FDA and has been authorized for detection and/or diagnosis of SARS-CoV-2 by FDA under an Emergency Use Authorization (EUA).  This EUA will remain in effect (meaning this test can be used) for the duration of the COVID-19 declaration under Section 564(b)(1) of the Act, 21 U.S.C. section 360bbb-3(b)(1), unless the authorization is terminated or revoked sooner. Performed at [facility], [address], [place], [place] [address]          Radiology Studies: No results found.      Scheduled Meds: . docusate sodium  100  mg Oral Daily  . latanoprost  1 drop Both Eyes QHS  . levothyroxine  88 mcg Oral Q0600  . losartan  50 mg Oral Daily  . nebivolol  5 mg Oral Daily  . warfarin  5 mg Oral ONCE-1600  . Warfarin - Pharmacist Dosing Inpatient   Does not apply q1600   Continuous Infusions: . heparin 850 Units/hr (11/15/19 0900)    Assessment & Plan:   Principal Problem:   Pulmonary embolism (HCC) Active Problems:   Hypothyroidism   Hypertension   Chronic kidney disease, stage III (moderate)   History of breast cancer   Endometrial cancer, grade I (HCC)   Superficial thrombophlebitis of right leg   IUD (intrauterine device) in place   Acute pulmonary embolism ([place])   Venous embolism and thrombosis of superficial vessels of left lower extremity   Bilateral pulmonary embolism (HCC)   Superficial thrombophlebitis of right leg -Patient presents with shortness of breath and swelling right lower extremity 3 days after IUD placement (11/09/19) by gynecologist for endometrial CA -Right lower extremity Doppler showing thrombophlebitis of greater saphenous distal  to the knee -CTA chest showing bilateral segmental and subsegmental PEs Oncology consulted, input was appreciated-Unclear if developed DVT and/or PE after IUD placement or was developing thrombosis prior to symptomatology. Thrombosis likely secondary to malignancy, age, and possibly Mirena IUD.             Limited hypercoagulable work-up. Recommended Coumadin with bridging with heparin gtt Started coumadin 5mg  qhs, pharmacy consult for dosing, INR is 1.1 today Needs follow-up with oncology as outpatient Megace contraindicated (higher risk of thrombosis). Plan: Continue heparin gtt and coumadin until inr 2   Endometrial CA Status post IUD (intrauterine device) placement on 11/09/2019, had declined surgery -Followed by Dr. Nechama Guard  Follow-up with Dr Theora Gianotti in the outpatient department.    Hypothyroidism -Continue home levothyroxine pending med  rec    Hypertension -Continue home meds    Chronic kidney disease, stage III (moderate) -Stable at baseline    History of breast cancer -No acute concerns   DVT prophylaxis: heparin gtt Code Status:full Family Communication: none at bedside Disposition Plan: back to previous home life Barrier: Needs INR to be therapeutic, on heparin drip with Coumadin start for PE likely in couple of days can be discharged if INR is therapeutic       LOS: 2 days   Time spent: 45 minutes with more than 50% on [place], MD [facility] Pager [phone]  If 7PM-7AM, please contact night-coverage [email or web address] Password [email or web address] 11/15/2019, 1:00 PM Patient ID: Whitney Simmons, female   DOB: [birth year], 86 y.o.   MRN: [id number]

## 2019-11-15 NOTE — Progress Notes (Addendum)
   11/15/19 1100  Clinical Encounter Type  Visited With Patient  Visit Type Initial  Referral From Nurse  Consult/Referral To Chaplain  While charting Nurse Barnabas Lister said that he thought Ms. Tatum would benefit from a visit. When chaplain entered the room, Ms. Vicki was reading. When asked how she was feeling she smiled said I have blood clots and cancer and she kept smiling. Chaplain commented on her smile and her attitude. Ms Bryla said that the best gratitude is a heart of gratitude. Ms. Kirrily is a woman of strong faith in God and she does not might sharing her faith with others. She is convinced that is why she is still in [facility], to share [employer] and [employer]. Chaplain explained how she was being encouraged by the one she cameto encouraged. Patient said, but I am encouraged. Ms. Cortina shared Ps. 46:10 with chaplain and encouraged her more. Before the visit was over chaplain was in tears. Ms. 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 With all that she is enduring she has a positive attitude and outlook. Chaplain prayed with  Ms. Meha and before leaving Ms. Maitland asked chaplain to visit her as long as she is a patient here. Chaplain told her that is her intentions. It was a pleasure visiting with Ms. Eisley. She thanked patient for prayer and visit and chaplain left.

## 2019-11-15 NOTE — Progress Notes (Signed)
ANTICOAGULATION CONSULT NOTE   Pharmacy Consult for heparin and warfarin Indication: pulmonary embolus  Patient Measurements: Height: 5\' 7"  (170.2 cm) Weight: 78.5 kg (173 lb) IBW/kg (Calculated) : 61.6 HEPARIN DW (KG): 77.4  Vital Signs: Temp: 97.9 F (36.6 C) (05/18 0411) Temp Source: Oral (05/17 2028) BP: 161/55 (05/18 0411) Pulse Rate: 80 (05/18 0411)  Labs: Recent Labs    11/12/19 2159 11/12/19 2159 11/13/19 0245 11/13/19 1505 11/14/19 0046 11/14/19 0846 11/14/19 1255 11/14/19 1428 11/14/19 1958 11/15/19 0452  HGB 13.4   < >  --   --   --   --   --  11.5*  --  11.3*  HCT 40.6  --   --   --   --   --   --  34.9*  --  34.8*  PLT 283  --   --   --   --   --   --  209  --  241  APTT  --   --  35  --   --   --   --   --   --   --   LABPROT  --   --  13.8  --   --   --   --   --   --  13.8  INR  --   --  1.1  --   --   --   --   --   --  1.1  HEPARINUNFRC  --   --   --    < >   < >  --  0.57  --  0.62 0.54  CREATININE 1.40*  --   --   --   --  1.14*  --   --   --   --    < > = values in this interval not displayed.    Estimated Creatinine Clearance: 36.1 mL/min (A) (by C-G formula based on SCr of 1.14 mg/dL (H)).   Medical History: Past Medical History:  Diagnosis Date  . Breast microcalcification, mammographic 01/31/2014  . 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 Declined postoperative radiation and antiestrogen therapy.  . Edema   . Hyperlipidemia   . Hypertension   . Hypothyroidism   . IBS (irritable bowel syndrome)   . Irregular heart beat   . stage III kidney disease   . Thyroid disease    s/p radioactive iodine ablation 30 years ago    Medications:  Medications Prior to Admission  Medication Sig Dispense Refill Last Dose  . aspirin EC 81 MG tablet Take 81 mg by mouth daily.   Past Month at Unknown time  . furosemide (LASIX) 20 MG tablet Take 1 tablet (20 mg total) by mouth daily. 90 tablet 2 11/12/2019 at  1000  . latanoprost (XALATAN) 0.005 % ophthalmic solution Place 1 drop into both eyes at bedtime.    11/11/2019 at 2300  . levothyroxine (EUTHYROX) 88 MCG tablet TAKE 1 TABLET BY MOUTH ONCE DAILY BEFORE BREAKFAST 90 tablet 3 11/12/2019 at 0900  . losartan (COZAAR) 50 MG tablet Take 1 tablet (50 mg total) by mouth daily. (Patient taking differently: Take 50 mg by mouth at bedtime. ) 90 tablet 2 11/11/2019 at 2300  . Multiple Vitamin (MULTIVITAMIN) tablet Take 1 tablet by mouth daily.   Past Week at Unknown time  . nebivolol (BYSTOLIC) 5 MG tablet Take 1 tablet (5 mg total) by mouth daily. 90 tablet 2 11/12/2019  at 1000    Assessment: 86 y.o. female with medical history significant for hypertension, hypothyroidism, CKD 3, history of breast cancer and recent diagnosis of endometrial cancer status post IUD placement by Dr. Gardiner Rhyme on 11/09/2019 who presents  for PE. CT angio of the chest showed partially occlusive segmental and subsegmental bilateral PE. She has no history to guide warfarin dose.  Heparin Course: 5/16 initiation: 5000 unit bolus, then 1250 units/hr 5/16 1705 HL 1.38: dec to 1050 units/hr 5/17 0046 HL 1.09: dec to 850 units/hr 5/17 1255 HL 0.57: no change 5/17 1958 HL 0.62  therapeutic X 2  5/18 0452 HL 0.54  Therapeutic x 3  Warfarin Course: 5/17 INR 1.1, Warfarin 5mg   Goal of Therapy:  Heparin level 0.3-0.7 units/ml INR 2.0 - 3.0 Monitor platelets by anticoagulation protocol: Yes   Plan:  Heparin:  Heparin level therapeutic: continue at 850 units/hr  Re-check heparin level in 6 hours  CBC in am  Continue heparin until INR therapeutic x 2  Warfarin:  Will reorder Warfarin 5mg  x 1   INR in am    Will recheck HL on 5/19 with AM labs.   Lalisa Kiehn A Lucille Witts 11/15/2019,8:01 AM

## 2019-11-16 LAB — CBC
HCT: 32.7 % — ABNORMAL LOW (ref 36.0–46.0)
Hemoglobin: 10.7 g/dL — ABNORMAL LOW (ref 12.0–15.0)
MCH: 29.5 pg (ref 26.0–34.0)
MCHC: 32.7 g/dL (ref 30.0–36.0)
MCV: 90.1 fL (ref 80.0–100.0)
Platelets: 179 10*3/uL (ref 150–400)
RBC: 3.63 MIL/uL — ABNORMAL LOW (ref 3.87–5.11)
RDW: 13.5 % (ref 11.5–15.5)
WBC: 9.7 10*3/uL (ref 4.0–10.5)
nRBC: 0 % (ref 0.0–0.2)

## 2019-11-16 LAB — HEPARIN LEVEL (UNFRACTIONATED): Heparin Unfractionated: 0.51 IU/mL (ref 0.30–0.70)

## 2019-11-16 LAB — LUPUS ANTICOAGULANT PANEL
DRVVT: 44.6 s (ref 0.0–47.0)
PTT Lupus Anticoagulant: 47.1 s (ref 0.0–51.9)

## 2019-11-16 LAB — PROTIME-INR
INR: 1.4 — ABNORMAL HIGH (ref 0.8–1.2)
Prothrombin Time: 16.2 seconds — ABNORMAL HIGH (ref 11.4–15.2)

## 2019-11-16 MED ORDER — LOSARTAN POTASSIUM 25 MG PO TABS
25.0000 mg | ORAL_TABLET | Freq: Every day | ORAL | Status: DC
  Administered 2019-11-16 – 2019-11-17 (×2): 25 mg via ORAL
  Filled 2019-11-16 (×2): qty 1

## 2019-11-16 MED ORDER — WARFARIN SODIUM 5 MG PO TABS
5.0000 mg | ORAL_TABLET | Freq: Once | ORAL | Status: AC
  Administered 2019-11-16: 5 mg via ORAL
  Filled 2019-11-16: qty 1

## 2019-11-16 NOTE — Care Management Important Message (Signed)
Important Message  Patient Details  Name: Whitney Simmons MRN: [id number] Date of Birth: 1927/11/09   Medicare Important Message Given:  Yes     [name] 11/16/2019, 11:04 AM

## 2019-11-16 NOTE — Progress Notes (Signed)
Visited Pt in her room while rounding unit. Pt really had a good outlook on life. Pt Daughter came to help her, but has a serious health problem herself. Pt is a religious person and had Bibles all over her desk. Pt is sick but, she is giving everyone encouraging words. Ch was impressed. Ch remains available as needed.

## 2019-11-16 NOTE — TOC Initial Note (Signed)
Transition of Care [facility]) - Initial/Assessment Note    Patient Details  Name: Whitney Simmons MRN: [id number] Date of Birth: 07/18/1927  Transition of Care [facility] CM/SW Contact:    Candie Chroman, LCSW Phone Number: 11/16/2019, 12:21 PM  Clinical Narrative:  CSW met with patient. No supports at bedside. CSW introduced role and inquired about home situation. Patient lives alone in the ILF section of [place], called [facility]. Her husband lives in the ALF section. She has a daughter that is a Personal assistant in [place]. She is in town now until patient is well enough for her to return. Patient said MD mentioned possibility of giving herself Lovenox shots at home twice a day for a few days but patient does not feel comfortable doing so. No further concerns. CSW encouraged patient to contact CSW as needed. CSW will continue to follow patient for support and facilitate return home when stable.                 Expected Discharge Plan: Home/Self Care Barriers to Discharge: Continued Medical Work up   Patient Goals and CMS Choice     Choice offered to / list presented to : NA  Expected Discharge Plan and Services Expected Discharge Plan: Home/Self Care     Post Acute Care Choice: NA Living arrangements for the past 2 months: [place]                                      Prior Living Arrangements/Services Living arrangements for the past 2 months: [place] Lives with:: Self Patient language and need for interpreter reviewed:: Yes Do you feel safe going back to the place where you live?: Yes      Need for Family Participation in Patient Care: Yes (Comment) Care giver support system in place?: No (comment)   Criminal Activity/Legal Involvement Pertinent to Current Situation/Hospitalization: No - Comment as needed  Activities of Daily Living Home Assistive Devices/Equipment: Walker (specify type) ADL Screening (condition at time of  admission) Patient's cognitive ability adequate to safely complete daily activities?: Yes Is the patient deaf or have difficulty hearing?: No Does the patient have difficulty seeing, even when wearing glasses/contacts?: No Does the patient have difficulty concentrating, remembering, or making decisions?: No Patient able to express need for assistance with ADLs?: Yes Does the patient have difficulty dressing or bathing?: No Independently performs ADLs?: Yes (appropriate for developmental age) Does the patient have difficulty walking or climbing stairs?: Yes Weakness of Legs: Both Weakness of Arms/Hands: None  Permission Sought/Granted                  Emotional Assessment Appearance:: Appears stated age Attitude/Demeanor/Rapport: Engaged, Gracious Affect (typically observed): Appropriate, Calm, Pleasant Orientation: : Oriented to Self, Oriented to Place, Oriented to  Time, Oriented to Situation Alcohol / Substance Use: Not Applicable Psych Involvement: No (comment)  Admission diagnosis:  Bilateral pulmonary embolism (HCC) [I26.99] Acute pulmonary embolism (HCC) [I26.99] Venous embolism and thrombosis of superficial vessels of left lower extremity [I82.812] Pain and swelling of right lower extremity [S56.812, M79.89] Pulmonary embolism (HCC) [I26.99] Patient Active Problem List   Diagnosis Date Noted  . Superficial thrombophlebitis of right leg 11/13/2019  . Pulmonary embolism ([place]) 11/13/2019  . IUD (intrauterine device) in place 11/13/2019  . Acute pulmonary embolism ([place]) 11/13/2019  . Venous embolism and thrombosis of superficial vessels of left lower  extremity   . Endometrial cancer, grade I ([place]) 10/25/2019  . Postmenopausal bleeding   . Preoperative exam for gynecologic surgery 10/05/2019  . Hallucination, visual 10/05/2019  . Vaginal discharge 06/30/2019  . Difficulty hearing 12/06/2018  . Bruising 05/31/2018  . Gait instability 09/22/2017  . Nevus 05/29/2017  .  Callus of foot 01/05/2017  . Thrombocytopenia ([place]) 11/06/2015  . Osteoporosis 03/07/2015  . Postmenopausal estrogen deficiency 01/23/2015  . Glaucoma 05/12/2014  . History of breast cancer 02/13/2014  . IBS (irritable bowel syndrome) 12/09/2013  . Palpitations 09/01/2012  . Chronic kidney disease, stage III (moderate) 08/18/2012  . Anxiety 08/02/2012  . Hypothyroidism 07/30/2012  . Hyperlipidemia 07/30/2012  . Hypertension 07/30/2012   PCP:  Leone Haven, MD Pharmacy:   [facility] [place], [place] [address] [place] [address] Phone: [phone] Fax: [phone]     Social Determinants of Health (SDOH) Interventions    Readmission Risk Interventions No flowsheet data found.

## 2019-11-16 NOTE — Hospital Course (Signed)
Whitney Simmons is a 86 y.o. female with medical history significant for hypertension, hypothyroidism, CKD 3, history of breast cancer and recent diagnosis of endometrial cancer status post IUD placement by Dr. Gardiner Rhyme on 11/09/2019 who presents to the emergency room with a complaint of pain and swelling and redness of the right leg and shortness of breath.  She denies chest pain.  Denies cough fever or chills.CT angio of the chest showed partially occlusive segmental and subsegmental bilateral PE with no evidence of right ventricular heart strain.  Patient started on a heparin infusion.  Hospitalist consulted for admission.

## 2019-11-16 NOTE — Progress Notes (Signed)
[place] for heparin and warfarin Indication: pulmonary embolus  Patient Measurements: Height: 5\' 7"  (170.2 cm) Weight: 78.5 kg (173 lb) IBW/kg (Calculated) : 61.6 HEPARIN DW (KG): 77.4  Vital Signs: Temp: 97.8 F (36.6 C) (05/19 0631) Temp Source: Oral (05/19 0631) BP: 128/49 (05/19 0631) Pulse Rate: 64 (05/19 0631)  Labs: Recent Labs    11/14/19 0846 11/14/19 1255 11/14/19 1428 11/14/19 1428 11/14/19 1958 11/15/19 0452 11/16/19 0505  HGB  --   --  11.5*   < >  --  11.3* 10.7*  HCT  --   --  34.9*  --   --  34.8* 32.7*  PLT  --   --  209  --   --  241 179  LABPROT  --   --   --   --   --  13.8 16.2*  INR  --   --   --   --   --  1.1 1.4*  HEPARINUNFRC  --    < >  --   --  0.62 0.54 0.51  CREATININE 1.14*  --   --   --   --   --   --    < > = values in this interval not displayed.    Estimated Creatinine Clearance: 36.1 mL/min (A) (by C-G formula based on SCr of 1.14 mg/dL (H)).   Medical History: Past Medical History:  Diagnosis Date  . Breast microcalcification, mammographic 01/31/2014  . 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 Declined postoperative radiation and antiestrogen therapy.  . Edema   . Hyperlipidemia   . Hypertension   . Hypothyroidism   . IBS (irritable bowel syndrome)   . Irregular heart beat   . stage III kidney disease   . Thyroid disease    s/p radioactive iodine ablation 30 years ago    Medications:  Medications Prior to Admission  Medication Sig Dispense Refill Last Dose  . aspirin EC 81 MG tablet Take 81 mg by mouth daily.   Past Month at Unknown time  . furosemide (LASIX) 20 MG tablet Take 1 tablet (20 mg total) by mouth daily. 90 tablet 2 11/12/2019 at 1000  . latanoprost (XALATAN) 0.005 % ophthalmic solution Place 1 drop into both eyes at bedtime.    11/11/2019 at 2300  . levothyroxine (EUTHYROX) 88 MCG tablet TAKE 1 TABLET BY MOUTH ONCE DAILY BEFORE  BREAKFAST 90 tablet 3 11/12/2019 at 0900  . losartan (COZAAR) 50 MG tablet Take 1 tablet (50 mg total) by mouth daily. (Patient taking differently: Take 50 mg by mouth at bedtime. ) 90 tablet 2 11/11/2019 at 2300  . Multiple Vitamin (MULTIVITAMIN) tablet Take 1 tablet by mouth daily.   Past Week at Unknown time  . nebivolol (BYSTOLIC) 5 MG tablet Take 1 tablet (5 mg total) by mouth daily. 90 tablet 2 11/12/2019 at 1000    Assessment: 86 y.o. female with medical history significant for hypertension, hypothyroidism, CKD 3, history of breast cancer and recent diagnosis of endometrial cancer status post IUD placement by Dr. Gardiner Rhyme on 11/09/2019 who presents  for PE. CT angio of the chest showed partially occlusive segmental and subsegmental bilateral PE. She has no history to guide warfarin dose. H&H, PLT  trending down  Heparin Course: 5/16 initiation: 5000 unit bolus, then 1250 units/hr 5/16 1705 HL 1.38: dec to 1050 units/hr 5/17 0046 HL 1.09: dec to 850 units/hr 5/17 1255 HL  0.57: no change 5/17 1958 HL 0.62  No change 5/18 0452 HL 0.54  No change 5/19 0500 HL 0.57: no change  Warfarin Course: Date   INR   Dose 5/17   1.1   5 mg 5/18   1.1   5 mg 5/19   1.4   5 mg  Goal of Therapy:  Heparin level 0.3-0.7 units/ml INR 2.0 - 3.0 Monitor platelets by anticoagulation protocol: Yes   Plan:  Heparin:  Heparin level therapeutic: continue at 850 units/hr  Re-check heparin level in am  CBC in am  Continue heparin until INR therapeutic x 2  Warfarin:  INR subtherapeutic, trending up: repeat warfarin 5mg  x 1   INR in am   Dallie Piles 11/16/2019,6:55 AM

## 2019-11-16 NOTE — Progress Notes (Signed)
PROGRESS NOTE    Whitney Simmons   [id number]  DOB: 1927-08-06  PCP: Leone Haven, MD    DOA: 11/12/2019 LOS: 3   Brief Narrative   Whitney Simmons is a 86 y.o. female with medical history significant for hypertension, hypothyroidism, CKD 3, history of breast cancer and recent diagnosis of endometrial cancer status post IUD placement by Dr. Gardiner Rhyme on 11/09/2019 who presents to the emergency room with a complaint of pain and swelling and redness of the right leg and shortness of breath.  She denies chest pain.  Denies cough fever or chills.CT angio of the chest showed partially occlusive segmental and subsegmental bilateral PE with no evidence of right ventricular heart strain.  Patient started on a heparin infusion.  Hospitalist consulted for admission.     Assessment & Plan   Principal Problem:   Pulmonary embolism (HCC) Active Problems:   Hypothyroidism   Hypertension   Chronic kidney disease, stage III (moderate)   History of breast cancer   Endometrial cancer, grade I (HCC)   Superficial thrombophlebitis of right leg   IUD (intrauterine device) in place   Acute pulmonary embolism (HCC)   Venous embolism and thrombosis of superficial vessels of left lower extremity   Bilateral pulmonary embolism -  Superficial thrombophlebitis of right leg -  Present on admission.  Presented with SOB and RLE swelling 3 days after IUD placement (11/09/19) by gynecologist for endometrial CA.  RLE doppler U/S showing thrombophlebitis of greater saphenous distal to the knee.  CTA chest showing bilateral segmental and subsegmental PE's.   --continue heparin until INR therapeutic x 24 hours (INR 1.4 today) --continue coumadin, dosing per pharmacy --heme/onc consulted - outpatient follow up --Megace contraindicated  Endometrial CA - Status postIUD (intrauterine device) placement on 11/09/2019, had declined surgery.  Followed by Dr. Gilman Schmidt.  Follow up as previously  scheduled.  Hypothyroidism - Continue home levothyroxine   Hypertension - Continue home losartan, Bystolic.   Reduced losartan to 25 mg given low DBP's.    Chronic kidney disease, stage IIIb - Stable at baseline.  Monitor renal function.  History of breast cancer - no acute issues.    DVT prophylaxis: heparin gtt  Diet:  Diet Orders (From admission, onward)    Start     Ordered   11/13/19 0300  Diet Heart Room service appropriate? Yes; Fluid consistency: Thin  Diet effective now    Question Answer Comment  Room service appropriate? Yes   Fluid consistency: Thin      11/13/19 0300            Code Status: Full Code    Subjective 11/16/19    Patient seen up in chair this AM.  No acute events reported.  She reports feeling well.  Says little short of breath, but improved.  Confirms she lives alone in independent apartment at [place], and husband in ALF or SNF there.  Daughter currently visiting from [place] but will be returning once patient discharged.  Patient says not comfortable giving herself injections at home.   Disposition Plan & Communication   Status is: Inpatient  Remains inpatient appropriate because:IV treatments appropriate due to intensity of illness or inability to take PO   Dispo: The patient is from: Home              Anticipated d/c is to: Home              Anticipated d/c date is: 2 days  Patient currently is not medically stable to d/c.   Family Communication: none at bedside, will attempt to call.    Consults, Procedures, Significant Events   Consultants:   Heme/oncology  Procedures:   None  Antimicrobials:   None    Objective   Vitals:   11/15/19 1307 11/15/19 2049 11/16/19 0631 11/16/19 1354  BP: (!) 147/56 (!) 145/68 (!) 128/49 (!) 132/54  Pulse: 70 63 64 61  Resp: (!) 24 18 18 16   Temp: 97.7 F (36.5 C) 98.2 F (36.8 C) 97.8 F (36.6 C) 98.2 F (36.8 C)  TempSrc: Oral Oral Oral Oral  SpO2: 97% 96% 95%  97%  Weight:      Height:        Intake/Output Summary (Last 24 hours) at 11/16/2019 1442 Last data filed at 11/16/2019 1421 Gross per 24 hour  Intake 453.49 ml  Output 400 ml  Net 53.49 ml   Filed Weights   11/12/19 2155  Weight: 78.5 kg    Physical Exam:  General exam: awake, alert, no acute distress HEENT: moist mucus membranes, hearing grossly normal  Respiratory system: CTAB, no wheezes, rales or rhonchi, normal respiratory effort. Cardiovascular system: normal S1/S2, RRR, no JVD, murmurs, rubs, gallops, no pedal edema.   Central nervous system: A&O x4. no gross focal neurologic deficits, normal speech Extremities: moves all, no cyanosis, normal tone Skin: dry, intact, normal temperature, normal color Psychiatry: normal mood, congruent affect, judgement and insight appear normal  Labs   Data Reviewed: I have personally reviewed following labs and imaging studies  CBC: Recent Labs  Lab 11/12/19 2159 11/14/19 1428 11/15/19 0452 11/16/19 0505  WBC 10.8* 8.9 10.9* 9.7  HGB 13.4 11.5* 11.3* 10.7*  HCT 40.6 34.9* 34.8* 32.7*  MCV 89.2 87.9 91.3 90.1  PLT 283 209 241 0000000   Basic Metabolic Panel: Recent Labs  Lab 11/12/19 2159 11/14/19 0846  NA 139 139  K 4.5 4.3  CL 104 107  CO2 26 22  GLUCOSE 110* 127*  BUN 26* 21  CREATININE 1.40* 1.14*  CALCIUM 8.6* 8.7*   GFR: Estimated Creatinine Clearance: 36.1 mL/min (A) (by C-G formula based on SCr of 1.14 mg/dL (H)). Liver Function Tests: Recent Labs  Lab 11/12/19 2159  AST 12*  ALT 11  ALKPHOS 94  BILITOT 0.7  PROT 7.6  ALBUMIN 3.8   No results for input(s): LIPASE, AMYLASE in the last 168 hours. No results for input(s): AMMONIA in the last 168 hours. Coagulation Profile: Recent Labs  Lab 11/13/19 0245 11/15/19 0452 11/16/19 0505  INR 1.1 1.1 1.4*   Cardiac Enzymes: No results for input(s): CKTOTAL, CKMB, CKMBINDEX, TROPONINI in the last 168 hours. BNP (last 3 results) No results for input(s):  PROBNP in the last 8760 hours. HbA1C: No results for input(s): HGBA1C in the last 72 hours. CBG: No results for input(s): GLUCAP in the last 168 hours. Lipid Profile: No results for input(s): CHOL, HDL, LDLCALC, TRIG, CHOLHDL, LDLDIRECT in the last 72 hours. Thyroid Function Tests: No results for input(s): TSH, T4TOTAL, FREET4, T3FREE, THYROIDAB in the last 72 hours. Anemia Panel: No results for input(s): VITAMINB12, FOLATE, FERRITIN, TIBC, IRON, RETICCTPCT in the last 72 hours. Sepsis Labs: No results for input(s): PROCALCITON, LATICACIDVEN in the last 168 hours.  Recent Results (from the past 240 hour(s))  Urine Culture     Status: None   Collection Time: 11/10/19  4:12 PM   Specimen: Urine   UR  Result Value Ref Range Status  Urine Culture, Routine Final report  Final   Organism ID, Bacteria No growth  Final  SARS Coronavirus 2 by RT PCR (hospital order, performed in [facility] hospital lab) Nasopharyngeal Nasopharyngeal Swab     Status: None   Collection Time: 11/13/19  4:05 AM   Specimen: Nasopharyngeal Swab  Result Value Ref Range Status   SARS Coronavirus 2 NEGATIVE NEGATIVE Final    Comment: (NOTE) SARS-CoV-2 target nucleic acids are NOT DETECTED. The SARS-CoV-2 RNA is generally detectable in upper and lower respiratory specimens during the acute phase of infection. The lowest concentration of SARS-CoV-2 viral copies this assay can detect is 250 copies / mL. A negative result does not preclude SARS-CoV-2 infection and should not be used as the sole basis for treatment or other patient management decisions.  A negative result may occur with improper specimen collection / handling, submission of specimen other than nasopharyngeal swab, presence of viral mutation(s) within the areas targeted by this assay, and inadequate number of viral copies (<250 copies / mL). A negative result must be combined with clinical observations, patient history, and epidemiological  information. Fact Sheet for Patients:   StrictlyIdeas.no Fact Sheet for Healthcare Providers: BankingDealers.co.za This test is not yet approved or cleared  by the [place] FDA and has been authorized for detection and/or diagnosis of SARS-CoV-2 by FDA under an Emergency Use Authorization (EUA).  This EUA will remain in effect (meaning this test can be used) for the duration of the COVID-19 declaration under Section 564(b)(1) of the Act, 21 U.S.C. section 360bbb-3(b)(1), unless the authorization is terminated or revoked sooner. Performed at [facility], [address] [place], [place] [address]       Imaging Studies   No results found.   Medications   Scheduled Meds: . docusate sodium  100 mg Oral Daily  . latanoprost  1 drop Both Eyes QHS  . levothyroxine  88 mcg Oral Q0600  . losartan  50 mg Oral Daily  . nebivolol  5 mg Oral Daily  . warfarin  5 mg Oral ONCE-1600  . Warfarin - Pharmacist Dosing Inpatient   Does not apply q1600   Continuous Infusions: . heparin 850 Units/hr (11/16/19 0644)       LOS: 3 days    Time spent: 30 minutes    Ezekiel Slocumb, DO [facility]  11/16/2019, 2:42 PM    If 7PM-7AM, please contact night-coverage. How to contact the [facility] Attending or Consulting provider [place] or covering provider during after hours [place], for this patient?    1. Check the care team in [facility] and look for a) attending/consulting [facility] provider listed and b) the [facility] team listed 2. Log into [email or web address] and use [place]'s universal password to access. If you do not have the password, please contact the hospital operator. 3. Locate the [facility] provider you are looking for under [facility] and page to a number that you can be directly reached. 4. If you still have difficulty reaching the provider, please page the [facility] (Director on Call) for the Hospitalists listed on amion for  assistance.

## 2019-11-16 NOTE — Progress Notes (Signed)
[place] for heparin and warfarin Indication: pulmonary embolus  Patient Measurements: Height: 5\' 7"  (170.2 cm) Weight: 78.5 kg (173 lb) IBW/kg (Calculated) : 61.6 HEPARIN DW (KG): 77.4  Vital Signs: Temp: 98.2 F (36.8 C) (05/18 2049) Temp Source: Oral (05/18 2049) BP: 145/68 (05/18 2049) Pulse Rate: 63 (05/18 2049)  Labs: Recent Labs    11/14/19 0846 11/14/19 1255 11/14/19 1428 11/14/19 1428 11/14/19 1958 11/15/19 0452 11/16/19 0505  HGB  --   --  11.5*   < >  --  11.3* 10.7*  HCT  --   --  34.9*  --   --  34.8* 32.7*  PLT  --   --  209  --   --  241 179  LABPROT  --   --   --   --   --  13.8 16.2*  INR  --   --   --   --   --  1.1 1.4*  HEPARINUNFRC  --    < >  --   --  0.62 0.54 0.51  CREATININE 1.14*  --   --   --   --   --   --    < > = values in this interval not displayed.    Estimated Creatinine Clearance: 36.1 mL/min (A) (by C-G formula based on SCr of 1.14 mg/dL (H)).   Medical History: Past Medical History:  Diagnosis Date  . Breast microcalcification, mammographic 01/31/2014  . 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 Declined postoperative radiation and antiestrogen therapy.  . Edema   . Hyperlipidemia   . Hypertension   . Hypothyroidism   . IBS (irritable bowel syndrome)   . Irregular heart beat   . stage III kidney disease   . Thyroid disease    s/p radioactive iodine ablation 30 years ago    Medications:  Medications Prior to Admission  Medication Sig Dispense Refill Last Dose  . aspirin EC 81 MG tablet Take 81 mg by mouth daily.   Past Month at Unknown time  . furosemide (LASIX) 20 MG tablet Take 1 tablet (20 mg total) by mouth daily. 90 tablet 2 11/12/2019 at 1000  . latanoprost (XALATAN) 0.005 % ophthalmic solution Place 1 drop into both eyes at bedtime.    11/11/2019 at 2300  . levothyroxine (EUTHYROX) 88 MCG tablet TAKE 1 TABLET BY MOUTH ONCE DAILY BEFORE  BREAKFAST 90 tablet 3 11/12/2019 at 0900  . losartan (COZAAR) 50 MG tablet Take 1 tablet (50 mg total) by mouth daily. (Patient taking differently: Take 50 mg by mouth at bedtime. ) 90 tablet 2 11/11/2019 at 2300  . Multiple Vitamin (MULTIVITAMIN) tablet Take 1 tablet by mouth daily.   Past Week at Unknown time  . nebivolol (BYSTOLIC) 5 MG tablet Take 1 tablet (5 mg total) by mouth daily. 90 tablet 2 11/12/2019 at 1000    Assessment: 86 y.o. female with medical history significant for hypertension, hypothyroidism, CKD 3, history of breast cancer and recent diagnosis of endometrial cancer status post IUD placement by Dr. Gardiner Rhyme on 11/09/2019 who presents  for PE. CT angio of the chest showed partially occlusive segmental and subsegmental bilateral PE. She has no history to guide warfarin dose.  Heparin Course: 5/16 initiation: 5000 unit bolus, then 1250 units/hr 5/16 1705 HL 1.38: dec to 1050 units/hr 5/17 0046 HL 1.09: dec to 850 units/hr 5/17 1255 HL 0.57: no change  Goal  of Therapy:  Heparin level 0.3-0.7 units/ml INR 2.0 - 3.0 Monitor platelets by anticoagulation protocol: Yes   Plan:  Heparin: 05/19 @ 0500 HL 0.51 therapeutic. Will continue current rate and will recheck HL w/ am labs, CBC trending down will continue to monitor.  Tobie Lords, PharmD, BCPS Clinical Pharmacist 11/16/2019,6:27 AM

## 2019-11-17 LAB — FACTOR 5 LEIDEN

## 2019-11-17 LAB — CBC
HCT: 32.7 % — ABNORMAL LOW (ref 36.0–46.0)
Hemoglobin: 10.5 g/dL — ABNORMAL LOW (ref 12.0–15.0)
MCH: 29.3 pg (ref 26.0–34.0)
MCHC: 32.1 g/dL (ref 30.0–36.0)
MCV: 91.3 fL (ref 80.0–100.0)
Platelets: 218 10*3/uL (ref 150–400)
RBC: 3.58 MIL/uL — ABNORMAL LOW (ref 3.87–5.11)
RDW: 13.3 % (ref 11.5–15.5)
WBC: 9.6 10*3/uL (ref 4.0–10.5)
nRBC: 0 % (ref 0.0–0.2)

## 2019-11-17 LAB — PROTIME-INR
INR: 2.1 — ABNORMAL HIGH (ref 0.8–1.2)
Prothrombin Time: 22.4 seconds — ABNORMAL HIGH (ref 11.4–15.2)

## 2019-11-17 LAB — BASIC METABOLIC PANEL
Anion gap: 6 (ref 5–15)
BUN: 18 mg/dL (ref 8–23)
CO2: 24 mmol/L (ref 22–32)
Calcium: 8.6 mg/dL — ABNORMAL LOW (ref 8.9–10.3)
Chloride: 107 mmol/L (ref 98–111)
Creatinine, Ser: 1.05 mg/dL — ABNORMAL HIGH (ref 0.44–1.00)
GFR calc Af Amer: 55 mL/min — ABNORMAL LOW (ref >60)
GFR calc non Af Amer: 47 mL/min — ABNORMAL LOW (ref >60)
Glucose, Bld: 99 mg/dL (ref 70–99)
Potassium: 4.6 mmol/L (ref 3.5–5.1)
Sodium: 137 mmol/L (ref 135–145)

## 2019-11-17 LAB — HEPARIN LEVEL (UNFRACTIONATED): Heparin Unfractionated: 0.51 IU/mL (ref 0.30–0.70)

## 2019-11-17 LAB — MAGNESIUM: Magnesium: 2.2 mg/dL (ref 1.7–2.4)

## 2019-11-17 MED ORDER — WARFARIN SODIUM 2.5 MG PO TABS
2.5000 mg | ORAL_TABLET | Freq: Once | ORAL | Status: AC
  Administered 2019-11-17: 2.5 mg via ORAL
  Filled 2019-11-17: qty 1

## 2019-11-17 NOTE — Progress Notes (Signed)
[place] for heparin and warfarin Indication: pulmonary embolus  Patient Measurements: Height: 5\' 7"  (170.2 cm) Weight: 78.5 kg (173 lb) IBW/kg (Calculated) : 61.6 HEPARIN DW (KG): 77.4  Vital Signs: Temp: 98.1 F (36.7 C) (05/20 0612) BP: 146/54 (05/20 0612) Pulse Rate: 63 (05/20 0612)  Labs: Recent Labs    11/14/19 0846 11/14/19 1255 11/15/19 0452 11/15/19 0452 11/16/19 0505 11/17/19 0546  HGB  --    < > 11.3*   < > 10.7* 10.5*  HCT  --    < > 34.8*  --  32.7* 32.7*  PLT  --    < > 241  --  179 218  LABPROT  --   --  13.8  --  16.2* 22.4*  INR  --   --  1.1  --  1.4* 2.1*  HEPARINUNFRC  --    < > 0.54  --  0.51 0.51  CREATININE 1.14*  --   --   --   --  1.05*   < > = values in this interval not displayed.    Estimated Creatinine Clearance: 39.2 mL/min (A) (by C-G formula based on SCr of 1.05 mg/dL (H)).   Medical History: Past Medical History:  Diagnosis Date  . Breast microcalcification, mammographic 01/31/2014  . 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 Declined postoperative radiation and antiestrogen therapy.  . Edema   . Hyperlipidemia   . Hypertension   . Hypothyroidism   . IBS (irritable bowel syndrome)   . Irregular heart beat   . stage III kidney disease   . Thyroid disease    s/p radioactive iodine ablation 30 years ago    Medications:  Medications Prior to Admission  Medication Sig Dispense Refill Last Dose  . aspirin EC 81 MG tablet Take 81 mg by mouth daily.   Past Month at Unknown time  . furosemide (LASIX) 20 MG tablet Take 1 tablet (20 mg total) by mouth daily. 90 tablet 2 11/12/2019 at 1000  . latanoprost (XALATAN) 0.005 % ophthalmic solution Place 1 drop into both eyes at bedtime.    11/11/2019 at 2300  . levothyroxine (EUTHYROX) 88 MCG tablet TAKE 1 TABLET BY MOUTH ONCE DAILY BEFORE BREAKFAST 90 tablet 3 11/12/2019 at 0900  . losartan (COZAAR) 50 MG tablet Take  1 tablet (50 mg total) by mouth daily. (Patient taking differently: Take 50 mg by mouth at bedtime. ) 90 tablet 2 11/11/2019 at 2300  . Multiple Vitamin (MULTIVITAMIN) tablet Take 1 tablet by mouth daily.   Past Week at Unknown time  . nebivolol (BYSTOLIC) 5 MG tablet Take 1 tablet (5 mg total) by mouth daily. 90 tablet 2 11/12/2019 at 1000    Assessment: 86 y.o. female with medical history significant for hypertension, hypothyroidism, CKD 3, history of breast cancer and recent diagnosis of endometrial cancer status post IUD placement by Dr. Gardiner Rhyme on 11/09/2019 who presents  for PE. CT angio of the chest showed partially occlusive segmental and subsegmental bilateral PE. She has no history to guide warfarin dose.  Heparin Course: 5/16 initiation: 5000 unit bolus, then 1250 units/hr 5/16 1705 HL 1.38: dec to 1050 units/hr 5/17 0046 HL 1.09: dec to 850 units/hr 5/17 1255 HL 0.57: no change  Goal of Therapy:  Heparin level 0.3-0.7 units/ml INR 2.0 - 3.0 Monitor platelets by anticoagulation protocol: Yes   Plan:  Heparin: 05/20 @ 0600 HL 0.51 therapeutic. Will continue current  rate and will recheck HL w/ am labs, CBC trending down will continue to monitor.  Tobie Lords, PharmD, BCPS Clinical Pharmacist 11/17/2019,7:14 AM

## 2019-11-17 NOTE — Progress Notes (Signed)
[place] for heparin and warfarin Indication: pulmonary embolus  Patient Measurements: Height: 5\' 7"  (170.2 cm) Weight: 78.5 kg (173 lb) IBW/kg (Calculated) : 61.6 HEPARIN DW (KG): 77.4  Vital Signs: Temp: 98.1 F (36.7 C) (05/20 0612) BP: 146/54 (05/20 0612) Pulse Rate: 63 (05/20 0612)  Labs: Recent Labs    11/14/19 0846 11/14/19 1255 11/15/19 0452 11/15/19 0452 11/16/19 0505 11/17/19 0546  HGB  --    < > 11.3*   < > 10.7* 10.5*  HCT  --    < > 34.8*  --  32.7* 32.7*  PLT  --    < > 241  --  179 218  LABPROT  --   --  13.8  --  16.2* 22.4*  INR  --   --  1.1  --  1.4* 2.1*  HEPARINUNFRC  --    < > 0.54  --  0.51 0.51  CREATININE 1.14*  --   --   --   --  1.05*   < > = values in this interval not displayed.    Estimated Creatinine Clearance: 39.2 mL/min (A) (by C-G formula based on SCr of 1.05 mg/dL (H)).   Medical History: Past Medical History:  Diagnosis Date  . Breast microcalcification, mammographic 01/31/2014  . 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 Declined postoperative radiation and antiestrogen therapy.  . Edema   . Hyperlipidemia   . Hypertension   . Hypothyroidism   . IBS (irritable bowel syndrome)   . Irregular heart beat   . stage III kidney disease   . Thyroid disease    s/p radioactive iodine ablation 30 years ago    Medications:  Medications Prior to Admission  Medication Sig Dispense Refill Last Dose  . aspirin EC 81 MG tablet Take 81 mg by mouth daily.   Past Month at Unknown time  . furosemide (LASIX) 20 MG tablet Take 1 tablet (20 mg total) by mouth daily. 90 tablet 2 11/12/2019 at 1000  . latanoprost (XALATAN) 0.005 % ophthalmic solution Place 1 drop into both eyes at bedtime.    11/11/2019 at 2300  . levothyroxine (EUTHYROX) 88 MCG tablet TAKE 1 TABLET BY MOUTH ONCE DAILY BEFORE BREAKFAST 90 tablet 3 11/12/2019 at 0900  . losartan (COZAAR) 50 MG tablet Take  1 tablet (50 mg total) by mouth daily. (Patient taking differently: Take 50 mg by mouth at bedtime. ) 90 tablet 2 11/11/2019 at 2300  . Multiple Vitamin (MULTIVITAMIN) tablet Take 1 tablet by mouth daily.   Past Week at Unknown time  . nebivolol (BYSTOLIC) 5 MG tablet Take 1 tablet (5 mg total) by mouth daily. 90 tablet 2 11/12/2019 at 1000    Assessment: 86 y.o. female with medical history significant for hypertension, hypothyroidism, CKD 3, history of breast cancer and recent diagnosis of endometrial cancer status post IUD placement by Dr. Gardiner Rhyme on 11/09/2019 who presents  for PE. CT angio of the chest showed partially occlusive segmental and subsegmental bilateral PE. She has no history to guide warfarin dose. H&H, PLT  trending down  Heparin Course: 5/16 initiation: 5000 unit bolus, then 1250 units/hr 5/16 1705 HL 1.38: dec to 1050 units/hr 5/17 0046 HL 1.09: dec to 850 units/hr 5/17 1255 HL 0.57: no change 5/17 1958 HL 0.62  No change 5/18 0452 HL 0.54  No change 5/19 0500 HL 0.57: no change 5/19 0546 HL 0.51 no change  Warfarin Course: Date   INR   Dose 5/17   1.1   5 mg 5/18   1.1   5 mg 5/19   1.4   5 mg 5/20   2.1   Goal of Therapy:  Heparin level 0.3-0.7 units/ml INR 2.0 - 3.0 Monitor platelets by anticoagulation protocol: Yes   Plan:  Heparin:  Heparin level therapeutic@ 0.51: continue at 850 units/hr  Re-check heparin level daily with am labs   CBC in am daily while on heparin infusion per protocol   Plan to continue heparin infusion until INR therapeutic x 2  Warfarin:  INR therapeutic x 1, trending up.   Will order dose of warfarin 2.5mg  x 1 tonight   INR in am   Pernell Dupre, PharmD, BCPS Clinical Pharmacist 11/17/2019 7:18 AM

## 2019-11-17 NOTE — Discharge Instructions (Signed)

## 2019-11-17 NOTE — Progress Notes (Signed)
   11/17/19 1305  Clinical Encounter Type  Visited With Patient  Visit Type Follow-up;Spiritual support  Referral From Chaplain  Consult/Referral To Chaplain  Chaplain had an extensive visit with patient. Ms. Ms. Whitney Simmons told chaplain that her daughter will be visiting and chaplain told her that she will come back with hopes of meeting her daughter, Whitney Simmons. MS. Whitney Simmons told chaplain that her daughter, has leukemia and looked so pale yesterday. Patient is concerned about her daughter. Patient asked chaplain to pray for Whitney Simmons and chaplain said she will. 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 Patient asked chaplain to keep in touch with her once she is discharged and chaplain told her that she would. Ms. Whitney Simmons explained that her numbers are coming up and that she might be going home tomorrow. After a lengthy conversation chaplain told patient that she will come back. If chaplain doesn't see patient later today, she will visit with her tomorrow morning.

## 2019-11-17 NOTE — Progress Notes (Signed)
PROGRESS NOTE    Whitney Simmons   [id number]  DOB: 04/30/1928  PCP: Leone Haven, MD    DOA: 11/12/2019 LOS: 4   Brief Narrative   Whitney Simmons is a 86 y.o. female with medical history significant for hypertension, hypothyroidism, CKD 3, history of breast cancer and recent diagnosis of endometrial cancer status post IUD placement by Dr. Gardiner Rhyme on 11/09/2019 who presents to the emergency room with a complaint of pain and swelling and redness of the right leg and shortness of breath.  She denies chest pain.  Denies cough fever or chills.CT angio of the chest showed partially occlusive segmental and subsegmental bilateral PE with no evidence of right ventricular heart strain.  Patient started on a heparin infusion.  Hospitalist consulted for admission.     Assessment & Plan   Principal Problem:   Pulmonary embolism (HCC) Active Problems:   Hypothyroidism   Hypertension   Chronic kidney disease, stage III (moderate)   History of breast cancer   Endometrial cancer, grade I (HCC)   Superficial thrombophlebitis of right leg   IUD (intrauterine device) in place   Acute pulmonary embolism (HCC)   Venous embolism and thrombosis of superficial vessels of left lower extremity   Bilateral pulmonary embolism -  Superficial thrombophlebitis of right leg -  Present on admission.  Presented with SOB and RLE swelling 3 days after IUD placement (11/09/19) by gynecologist for endometrial CA.  RLE doppler U/S showing thrombophlebitis of greater saphenous distal to the knee.  CTA chest showing bilateral segmental and subsegmental PE's.   --continue heparin until INR therapeutic x 2 days (INR is 2.1 today) --continue coumadin, dosing per pharmacy --heme/onc consulted - outpatient follow up --Megace contraindicated  Endometrial CA - Status postIUD (intrauterine device) placement on 11/09/2019, had declined surgery.  Followed by Dr. Gilman Schmidt.  Follow up as previously  scheduled.  Hypothyroidism - Continue home levothyroxine   Hypertension - Continue home losartan, Bystolic.   Reduced losartan to 25 mg given low DBP's.    Chronic kidney disease, stage IIIb - Stable at baseline.  Monitor renal function.  History of breast cancer - no acute issues.    DVT prophylaxis: heparin gtt  Diet:  Diet Orders (From admission, onward)    Start     Ordered   11/13/19 0300  Diet Heart Room service appropriate? Yes; Fluid consistency: Thin  Diet effective now    Question Answer Comment  Room service appropriate? Yes   Fluid consistency: Thin      11/13/19 0300            Code Status: Full Code    Subjective 11/17/19    Patient seen up in chair this AM.  No acute events reported.  She is in her usual good spirits.  Happy to hear INR up to 2, and hopes she can go home tomorrow.  She denies CP, SOB, F/C, N/V/D or other complaints.     Disposition Plan & Communication   Status is: Inpatient  Remains inpatient appropriate because:IV treatments appropriate due to intensity of illness or inability to take PO.  Patient on heparin bridging to warfarin, needs therapeutic INR x 2 days.  Expect d/c home tomorrow 5/21.   Dispo: The patient is from: Home              Anticipated d/c is to: Home              Anticipated d/c date is: 1 day  Patient currently is not medically stable to d/c.   Family Communication: none at bedside, will attempt to call.    Consults, Procedures, Significant Events   Consultants:   Heme/oncology  Procedures:   None  Antimicrobials:   None    Objective   Vitals:   11/16/19 1354 11/16/19 2029 11/17/19 0612 11/17/19 1332  BP: (!) 132/54 (!) 145/63 (!) 146/54 (!) 157/54  Pulse: 61 65 63 70  Resp: 16 18 20 18   Temp: 98.2 F (36.8 C) 98.2 F (36.8 C) 98.1 F (36.7 C) 98.3 F (36.8 C)  TempSrc: Oral   Oral  SpO2: 97% 97% 96% 97%  Weight:      Height:        Intake/Output Summary (Last 24  hours) at 11/17/2019 1554 Last data filed at 11/17/2019 1300 Gross per 24 hour  Intake 894.09 ml  Output 300 ml  Net 594.09 ml   Filed Weights   11/12/19 2155  Weight: 78.5 kg    Physical Exam:  General exam: awake, alert, no acute distress Respiratory system: CTAB, no wheezes, rales or rhonchi, normal respiratory effort. Cardiovascular system: normal S1/S2, RRR, no JVD, murmurs, rubs, gallops, no pedal edema.   Central nervous system: A&O x4. no gross focal neurologic deficits, normal speech Extremities: moves all, no cyanosis, normal tone   Labs   Data Reviewed: I have personally reviewed following labs and imaging studies  CBC: Recent Labs  Lab 11/12/19 2159 11/14/19 1428 11/15/19 0452 11/16/19 0505 11/17/19 0546  WBC 10.8* 8.9 10.9* 9.7 9.6  HGB 13.4 11.5* 11.3* 10.7* 10.5*  HCT 40.6 34.9* 34.8* 32.7* 32.7*  MCV 89.2 87.9 91.3 90.1 91.3  PLT 283 209 241 179 [id number]   Basic Metabolic Panel: Recent Labs  Lab 11/12/19 2159 11/14/19 0846 11/17/19 0546  NA 139 139 137  K 4.5 4.3 4.6  CL 104 107 107  CO2 26 22 24   GLUCOSE 110* 127* 99  BUN 26* 21 18  CREATININE 1.40* 1.14* 1.05*  CALCIUM 8.6* 8.7* 8.6*  MG  --   --  2.2   GFR: Estimated Creatinine Clearance: 39.2 mL/min (A) (by C-G formula based on SCr of 1.05 mg/dL (H)). Liver Function Tests: Recent Labs  Lab 11/12/19 2159  AST 12*  ALT 11  ALKPHOS 94  BILITOT 0.7  PROT 7.6  ALBUMIN 3.8   No results for input(s): LIPASE, AMYLASE in the last 168 hours. No results for input(s): AMMONIA in the last 168 hours. Coagulation Profile: Recent Labs  Lab 11/13/19 0245 11/15/19 0452 11/16/19 0505 11/17/19 0546  INR 1.1 1.1 1.4* 2.1*   Cardiac Enzymes: No results for input(s): CKTOTAL, CKMB, CKMBINDEX, TROPONINI in the last 168 hours. BNP (last 3 results) No results for input(s): PROBNP in the last 8760 hours. HbA1C: No results for input(s): HGBA1C in the last 72 hours. CBG: No results for input(s):  GLUCAP in the last 168 hours. Lipid Profile: No results for input(s): CHOL, HDL, LDLCALC, TRIG, CHOLHDL, LDLDIRECT in the last 72 hours. Thyroid Function Tests: No results for input(s): TSH, T4TOTAL, FREET4, T3FREE, THYROIDAB in the last 72 hours. Anemia Panel: No results for input(s): VITAMINB12, FOLATE, FERRITIN, TIBC, IRON, RETICCTPCT in the last 72 hours. Sepsis Labs: No results for input(s): PROCALCITON, LATICACIDVEN in the last 168 hours.  Recent Results (from the past 240 hour(s))  Urine Culture     Status: None   Collection Time: 11/10/19  4:12 PM   Specimen: Urine   UR  Result Value  Ref Range Status   Urine Culture, Routine Final report  Final   Organism ID, Bacteria No growth  Final  SARS Coronavirus 2 by RT PCR (hospital order, performed in [facility] hospital lab) Nasopharyngeal Nasopharyngeal Swab     Status: None   Collection Time: 11/13/19  4:05 AM   Specimen: Nasopharyngeal Swab  Result Value Ref Range Status   SARS Coronavirus 2 NEGATIVE NEGATIVE Final    Comment: (NOTE) SARS-CoV-2 target nucleic acids are NOT DETECTED. The SARS-CoV-2 RNA is generally detectable in upper and lower respiratory specimens during the acute phase of infection. The lowest concentration of SARS-CoV-2 viral copies this assay can detect is 250 copies / mL. A negative result does not preclude SARS-CoV-2 infection and should not be used as the sole basis for treatment or other patient management decisions.  A negative result may occur with improper specimen collection / handling, submission of specimen other than nasopharyngeal swab, presence of viral mutation(s) within the areas targeted by this assay, and inadequate number of viral copies (<250 copies / mL). A negative result must be combined with clinical observations, patient history, and epidemiological information. Fact Sheet for Patients:   StrictlyIdeas.no Fact Sheet for Healthcare  Providers: BankingDealers.co.za This test is not yet approved or cleared  by the [place] FDA and has been authorized for detection and/or diagnosis of SARS-CoV-2 by FDA under an Emergency Use Authorization (EUA).  This EUA will remain in effect (meaning this test can be used) for the duration of the COVID-19 declaration under Section 564(b)(1) of the Act, 21 U.S.C. section 360bbb-3(b)(1), unless the authorization is terminated or revoked sooner. Performed at [facility], [address], [place], [place] [address]       Imaging Studies   No results found.   Medications   Scheduled Meds: . docusate sodium  100 mg Oral Daily  . latanoprost  1 drop Both Eyes QHS  . levothyroxine  88 mcg Oral Q0600  . losartan  25 mg Oral Daily  . nebivolol  5 mg Oral Daily  . warfarin  2.5 mg Oral ONCE-1600  . Warfarin - Pharmacist Dosing Inpatient   Does not apply q1600   Continuous Infusions: . heparin 850 Units/hr (11/17/19 1138)       LOS: 4 days    Time spent: 15 minutes    Ezekiel Slocumb, DO [facility]  11/17/2019, 3:54 PM    If 7PM-7AM, please contact night-coverage. How to contact the [facility] Attending or Consulting provider [place] or covering provider during after hours [place], for this patient?    1. Check the care team in [facility] and look for a) attending/consulting TRH provider listed and b) the [facility] team listed 2. Log into [email or web address] and use [facility]'s universal password to access. If you do not have the password, please contact the hospital operator. 3. Locate the [facility] provider you are looking for under [facility] and page to a number that you can be directly reached. 4. If you still have difficulty reaching the provider, please page the [facility] (Director on Call) for the Hospitalists listed on amion for assistance.

## 2019-11-17 NOTE — Evaluation (Signed)
Physical Therapy Evaluation Patient Details Name: Whitney Simmons MRN: [id number] DOB: 09-21-1927 Today's Date: 11/17/2019   History of Present Illness  Per MD notes: Pt is an 86 y.o. female with medical history significant for hypertension, hypothyroidism, CKD 3, history of breast cancer and recent diagnosis of endometrial cancer status post IUD placement by Dr. Gardiner Rhyme on 11/09/2019 who presents to the emergency room with a complaint of pain and swelling and redness of the right leg and shortness of breath.  MD assessment includes: Bilateral pulmonary embolism and superficial thrombophlebitis of right leg.    Clinical Impression  Pt pleasant and motivated to participate during the session.  Pt required no assistance during the session including during ambulation both with and without an AD with good gait speed.  Pt's SpO2 and HR were both WNL during the session with pt reporting min to mod SOB after amb 200' that resolved quickly that pt reports is baseline for her since her CA diagnosis.  Pt reports feeling that she is at her baseline functionally and no skilled PT or equipment needs identified during the session.  Will complete PT orders at this time but will reassess pt pending a change in status upon receipt of new PT orders.      Follow Up Recommendations No PT follow up    Equipment Recommendations  None recommended by PT    Recommendations for Other Services       Precautions / Restrictions Precautions Precautions: Fall Restrictions Weight Bearing Restrictions: No      Mobility  Bed Mobility Overal bed mobility: Independent                Transfers Overall transfer level: Independent Equipment used: None             General transfer comment: Good eccentric and concentric control and stability  Ambulation/Gait Ambulation/Gait assistance: Modified independent (Device/Increase time) Gait Distance (Feet): 200 Feet Assistive device: Rolling walker (2  wheeled);None Gait Pattern/deviations: WFL(Within Functional Limits) Gait velocity: WNL   General Gait Details: Pt steady without LOB with very good cadence during amb 200' with a RW and 70' without an AD  Stairs            Wheelchair Mobility    Modified Rankin (Stroke Patients Only)       Balance Overall balance assessment: No apparent balance deficits (not formally assessed)                                           Pertinent Vitals/Pain Pain Assessment: No/denies pain    Home Living Family/patient expects to be discharged to:: Private residence Living Arrangements: Alone Available Help at Discharge: Family;Available 24 hours/day Type of Home: Independent living facility Home Access: Level entry     Home Layout: One level Home Equipment: Walker - 2 wheels;Walker - 4 wheels;Cane - single point;Grab bars - tub/shower Additional Comments: Owns lifeline, daughter visiting from [place] and can stay with pt as needed upon discharge home    Prior Function Level of Independence: Independent with assistive device(s)         Comments: Ind amb in the home without an AD, rollator in the community, no falls in the last 6 months, Ind with ADLs     Hand Dominance        Extremity/Trunk Assessment   Upper Extremity Assessment Upper Extremity Assessment: Overall WFL for tasks  assessed    Lower Extremity Assessment Lower Extremity Assessment: Overall WFL for tasks assessed       Communication   Communication: No difficulties  Cognition Arousal/Alertness: Awake/alert Behavior During Therapy: WFL for tasks assessed/performed Overall Cognitive Status: Within Functional Limits for tasks assessed                                        General Comments      Exercises     Assessment/Plan    PT Assessment Patent does not need any further PT services  PT Problem List         PT Treatment Interventions      PT Goals  (Current goals can be found in the Care Plan section)  Acute Rehab PT Goals PT Goal Formulation: All assessment and education complete, DC therapy    Frequency     Barriers to discharge        Co-evaluation               AM-PAC PT "6 Clicks" Mobility  Outcome Measure Help needed turning from your back to your side while in a flat bed without using bedrails?: None Help needed moving from lying on your back to sitting on the side of a flat bed without using bedrails?: None Help needed moving to and from a bed to a chair (including a wheelchair)?: None Help needed standing up from a chair using your arms (e.g., wheelchair or bedside chair)?: None Help needed to walk in hospital room?: None Help needed climbing 3-5 steps with a railing? : None 6 Click Score: 24    End of Session Equipment Utilized During Treatment: Gait belt Activity Tolerance: Patient tolerated treatment well Patient left: in chair;with call bell/phone within reach;Other (comment)(No chair alarm needed per nursing) Nurse Communication: Mobility status PT Visit Diagnosis: Muscle weakness (generalized) (M62.81)    Time: [id number] PT Time Calculation (min) (ACUTE ONLY): 20 min   Charges:   PT Evaluation $PT Eval Low Complexity: 1 Low          D. Scott Juneau Doughman PT, DPT 11/17/19, 10:57 AM

## 2019-11-18 LAB — CBC
HCT: 32.1 % — ABNORMAL LOW (ref 36.0–46.0)
Hemoglobin: 10.8 g/dL — ABNORMAL LOW (ref 12.0–15.0)
MCH: 29.8 pg (ref 26.0–34.0)
MCHC: 33.6 g/dL (ref 30.0–36.0)
MCV: 88.7 fL (ref 80.0–100.0)
Platelets: 216 10*3/uL (ref 150–400)
RBC: 3.62 MIL/uL — ABNORMAL LOW (ref 3.87–5.11)
RDW: 13.6 % (ref 11.5–15.5)
WBC: 9.9 10*3/uL (ref 4.0–10.5)
nRBC: 0 % (ref 0.0–0.2)

## 2019-11-18 LAB — HEPARIN LEVEL (UNFRACTIONATED): Heparin Unfractionated: 0.48 IU/mL (ref 0.30–0.70)

## 2019-11-18 LAB — PROTIME-INR
INR: 2.7 — ABNORMAL HIGH (ref 0.8–1.2)
Prothrombin Time: 27.7 seconds — ABNORMAL HIGH (ref 11.4–15.2)

## 2019-11-18 LAB — PROTHROMBIN GENE MUTATION

## 2019-11-18 MED ORDER — LOSARTAN POTASSIUM 25 MG PO TABS: 25.0000 mg | ORAL_TABLET | Freq: Every day | ORAL | 1 refills | Status: DC

## 2019-11-18 MED ORDER — WARFARIN SODIUM 2.5 MG PO TABS: 2.5000 mg | ORAL_TABLET | Freq: Every day | ORAL | 1 refills | Status: DC

## 2019-11-18 NOTE — TOC Transition Note (Signed)
Transition of Care [facility]) - CM/SW Discharge Note   Patient Details  Name: Whitney Simmons MRN: [id number] Date of Birth: 09-Dec-1927  [facility] of [facility]) CM/SW Contact:  Candie Chroman, LCSW Phone Number: 11/18/2019, 9:10 AM   Clinical Narrative:  Patient has orders to discharge home today. No further concerns. CSW signing off.   Final next level of care: Home/Self Care Barriers to Discharge: Barriers Resolved   Patient Goals and CMS Choice     Choice offered to / list presented to : NA  Discharge Placement                    Patient and family notified of of transfer: 11/18/19  Discharge Plan and Services     Post Acute Care Choice: NA                               Social Determinants of Health (SDOH) Interventions     Readmission Risk Interventions No flowsheet data found.

## 2019-11-18 NOTE — Progress Notes (Signed)
[place] for heparin and warfarin Indication: pulmonary embolus  Patient Measurements: Height: 5\' 7"  (170.2 cm) Weight: 78.5 kg (173 lb) IBW/kg (Calculated) : 61.6 HEPARIN DW (KG): 77.4  Vital Signs: Temp: 97.6 F (36.4 C) (05/21 0634) Temp Source: Oral (05/21 0634) BP: 141/57 (05/21 0634) Pulse Rate: 60 (05/21 0634)  Labs: Recent Labs    11/16/19 0505 11/16/19 0505 11/17/19 0546 11/18/19 0543  HGB 10.7*   < > 10.5* 10.8*  HCT 32.7*  --  32.7* 32.1*  PLT 179  --  218 216  LABPROT 16.2*  --  22.4* 27.7*  INR 1.4*  --  2.1* 2.7*  HEPARINUNFRC 0.51  --  0.51 0.48  CREATININE  --   --  1.05*  --    < > = values in this interval not displayed.    Estimated Creatinine Clearance: 39.2 mL/min (A) (by C-G formula based on SCr of 1.05 mg/dL (H)).   Medical History: Past Medical History:  Diagnosis Date  . Breast microcalcification, mammographic 01/31/2014  . 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 Declined postoperative radiation and antiestrogen therapy.  . Edema   . Hyperlipidemia   . Hypertension   . Hypothyroidism   . IBS (irritable bowel syndrome)   . Irregular heart beat   . stage III kidney disease   . Thyroid disease    s/p radioactive iodine ablation 30 years ago    Medications:  Medications Prior to Admission  Medication Sig Dispense Refill Last Dose  . aspirin EC 81 MG tablet Take 81 mg by mouth daily.   Past Month at Unknown time  . furosemide (LASIX) 20 MG tablet Take 1 tablet (20 mg total) by mouth daily. 90 tablet 2 11/12/2019 at 1000  . latanoprost (XALATAN) 0.005 % ophthalmic solution Place 1 drop into both eyes at bedtime.    11/11/2019 at 2300  . levothyroxine (EUTHYROX) 88 MCG tablet TAKE 1 TABLET BY MOUTH ONCE DAILY BEFORE BREAKFAST 90 tablet 3 11/12/2019 at 0900  . losartan (COZAAR) 50 MG tablet Take 1 tablet (50 mg total) by mouth daily. (Patient taking differently:  Take 50 mg by mouth at bedtime. ) 90 tablet 2 11/11/2019 at 2300  . Multiple Vitamin (MULTIVITAMIN) tablet Take 1 tablet by mouth daily.   Past Week at Unknown time  . nebivolol (BYSTOLIC) 5 MG tablet Take 1 tablet (5 mg total) by mouth daily. 90 tablet 2 11/12/2019 at 1000    Assessment: 86 y.o. female with medical history significant for hypertension, hypothyroidism, CKD 3, history of breast cancer and recent diagnosis of endometrial cancer status post IUD placement by Dr. Gardiner Rhyme on 11/09/2019 who presents  for PE. CT angio of the chest showed partially occlusive segmental and subsegmental bilateral PE. She has no history to guide warfarin dose.  Heparin Course: 5/16 initiation: 5000 unit bolus, then 1250 units/hr 5/16 1705 HL 1.38: dec to 1050 units/hr 5/17 0046 HL 1.09: dec to 850 units/hr 5/17 1255 HL 0.57: no change  Goal of Therapy:  Heparin level 0.3-0.7 units/ml INR 2.0 - 3.0 Monitor platelets by anticoagulation protocol: Yes   Plan:  Heparin: 05/21 @ 0500 HL 0.48 therapeutic. Will continue current rate and will recheck HL w/ am labs, CBC stable will continue to monitor.  Tobie Lords, PharmD, BCPS Clinical Pharmacist 11/18/2019,6:39 AM

## 2019-11-18 NOTE — Plan of Care (Signed)
The patient has been discharged. IV's have been removed. Education performed on how to take new medications. The patient is waiting for daughter to pick her up.  Problem: Education: Goal: Knowledge of General Education information will improve Description: Including pain rating scale, medication(s)/side effects and non-pharmacologic comfort measures Outcome: Completed/Met   Problem: Activity: Goal: Risk for activity intolerance will decrease Outcome: Completed/Met   Problem: Nutrition: Goal: Adequate nutrition will be maintained Outcome: Completed/Met   Problem: Coping: Goal: Level of anxiety will decrease Outcome: Completed/Met   Problem: Elimination: Goal: Will not experience complications related to bowel motility Outcome: Completed/Met Goal: Will not experience complications related to urinary retention Outcome: Completed/Met   Problem: Pain Managment: Goal: General experience of comfort will improve Outcome: Completed/Met   Problem: Safety: Goal: Ability to remain free from injury will improve Outcome: Completed/Met   Problem: Skin Integrity: Goal: Risk for impaired skin integrity will decrease Outcome: Completed/Met

## 2019-11-18 NOTE — Care Management Important Message (Signed)
Important Message  Patient Details  Name: PATRA SHORTRIDGE MRN: [id number] Date of Birth: 07-Jun-1928   Medicare Important Message Given:  Yes     [name] 11/18/2019, 1:04 PM

## 2019-11-18 NOTE — Progress Notes (Signed)
   11/18/19 1000  Clinical Encounter Type  Visited With Patient  Visit Type Follow-up  Referral From Chaplain  Consult/Referral To [place] noticed that Ms. Ardyth was being discharged and she ran to say good-bye. Ms. Helene was sitting, fully clothe. Ms. Moses told chaplain that she would like to keep in touch with her, therefore she gave chaplain her email, [email or web address]. Chaplain talked with patient for a while and daughter, [name] came to pick her up.

## 2019-11-18 NOTE — Discharge Summary (Signed)
Physician Discharge Summary  Whitney Simmons [id number] DOB: 01-26-1928 DOA: 11/12/2019  PCP: Leone Haven, MD  Admit date: 11/12/2019 Discharge date: 11/18/2019  Admitted From: home Disposition:  home  Recommendations for Outpatient Follow-up:  1. Follow up with PCP in 1-2 weeks 2. Please obtain BMP/CBC in one week 3. Please follow up on patient's INR and Warfarin dosing  4. Please follow up on patient's blood pressure.  Her losartan dose was decreased during admission.    Home Health: None indicated, no therapy follow up recommended Equipment/Devices: None  Discharge Condition: Stable  CODE STATUS: Full  Diet recommendation: Heart Healthy    Discharge Diagnoses: Principal Problem:   Pulmonary embolism (HCC) Active Problems:   Hypothyroidism   Hypertension   Chronic kidney disease, stage III (moderate)   History of breast cancer   Endometrial cancer, grade I (HCC)   Superficial thrombophlebitis of right leg   IUD (intrauterine device) in place   Acute pulmonary embolism (HCC)   Venous embolism and thrombosis of superficial vessels of left lower extremity    Summary of HPI and Hospital Course:  Whitney Simmons is a 86 y.o. female with medical history significant for hypertension, hypothyroidism, CKD 3, history of breast cancer and recent diagnosis of endometrial cancer status post IUD placement by Dr. Gardiner Rhyme on 11/09/2019 who presents to the emergency room with a complaint of pain and swelling and redness of the right leg and shortness of breath.  She denies chest pain.  Denies cough fever or chills.CT angio of the chest showed partially occlusive segmental and subsegmental bilateral PE with no evidence of right ventricular heart strain.  Patient started on a heparin infusion.  Hospitalist consulted for admission.    Bilateral pulmonary embolism -  Superficial thrombophlebitis of right leg -  Present on admission.  Presented with SOB and RLE swelling 3 days after  IUD placement (11/09/19) by gynecologist for endometrial CA.  RLE doppler U/S showing thrombophlebitis of greater saphenous distal to the knee.  CTA chest showing bilateral segmental and subsegmental PE's.  Treated with heparin drip, bridged to Warfarin.  INR therapeutic x 2 days prior to discharge.  Discharged on 2.5 mg Warfarin daily and close PCP follow up for INR check.  Stopped Megace, is contraindicated in setting of VTE.  Heme/oncology was consulted and will follow up with patient as well.   Endometrial CA - Status postIUD (intrauterine device) placement on 11/09/2019, had declined surgery.  Followed by Dr. Gilman Schmidt.  Follow up as previously scheduled.  Hypothyroidism - Continue home levothyroxine   Hypertension - Continue home losartan, Bystolic.   Reduced losartan to 25 mg given low DBP's.    Chronic kidney disease, stage IIIb - Stable at baseline.  Monitor renal function.  History of breast cancer - no acute issues.    Discharge Instructions   Discharge Instructions    Call MD for:   Complete by: As directed    Bleeding or if you have a fall.   If you have worsening shortness of breath or develop chest pain.   Call MD for:  extreme fatigue   Complete by: As directed    Call MD for:  persistant dizziness or light-headedness   Complete by: As directed    Diet - low sodium heart healthy   Complete by: As directed    Discharge instructions   Complete by: As directed    Take warfarin as prescribed, once daily. Your primary care doctor should recheck your INR next week.  Increase activity slowly   Complete by: As directed      Allergies as of 11/18/2019      Reactions   Levaquin [levofloxacin In D5w] Other (See Comments)   hallucinations   Sulfa Antibiotics    Hallucinations      Medication List    TAKE these medications   aspirin EC 81 MG tablet Take 81 mg by mouth daily.   furosemide 20 MG tablet Commonly known as: LASIX Take 1 tablet (20 mg total) by  mouth daily.   latanoprost 0.005 % ophthalmic solution Commonly known as: XALATAN Place 1 drop into both eyes at bedtime.   levothyroxine 88 MCG tablet Commonly known as: Euthyrox TAKE 1 TABLET BY MOUTH ONCE DAILY BEFORE BREAKFAST   losartan 25 MG tablet Commonly known as: COZAAR Take 1 tablet (25 mg total) by mouth daily. What changed:   medication strength  how much to take   multivitamin tablet Take 1 tablet by mouth daily.   nebivolol 5 MG tablet Commonly known as: Bystolic Take 1 tablet (5 mg total) by mouth daily.   warfarin 2.5 MG tablet Commonly known as: Coumadin Take 1 tablet (2.5 mg total) by mouth daily.       Allergies  Allergen Reactions  . Levaquin [Levofloxacin In D5w] Other (See Comments)    hallucinations  . Sulfa Antibiotics     Hallucinations     Consultations:  Hematology/Oncology    Procedures/Studies: CT Abdomen Pelvis Wo Contrast  Result Date: 11/08/2019 CLINICAL DATA:  Newly diagnosed endometrial carcinoma. Staging. Abdominal distension. Shortness of breath. EXAM: CT CHEST, ABDOMEN AND PELVIS WITHOUT CONTRAST TECHNIQUE: Multidetector CT imaging of the chest, abdomen and pelvis was performed following the standard protocol without IV contrast. COMPARISON:  None. FINDINGS: CT CHEST FINDINGS Cardiovascular: No acute findings. Aortic and coronary artery atherosclerosis noted. Mediastinum/Lymph Nodes: No masses or pathologically enlarged lymph nodes identified on this unenhanced exam. Lungs/Pleura: Mild pleural-parenchymal scarring is seen bilaterally. No evidence of pulmonary infiltrate pleural effusion. No suspicious pulmonary nodules or masses identified. Musculoskeletal: No suspicious bone lesions identified. CT ABDOMEN AND PELVIS FINDINGS Hepatobiliary: No masses visualized on this unenhanced exam. Prior cholecystectomy. No evidence of biliary obstruction. Pancreas: No mass or inflammatory changes identified on this unenhanced exam. Spleen:  Within normal limits in size. Adrenals/Urinary Tract: No evidence of urolithiasis or hydronephrosis. Unremarkable appearance of bladder. Stomach/Bowel: No evidence of obstruction, inflammatory process, or abnormal fluid collections. Diverticulosis is seen mainly involving the sigmoid colon, however there is no evidence of diverticulitis. Vascular/Lymphatic: No pathologically enlarged lymph nodes identified. No abdominal aortic aneurysm. Aortic atherosclerosis noted. Reproductive: Uterus is normal in size. No adnexal mass or other significant abnormality identified. Other:  None. Musculoskeletal: No suspicious bone lesions identified. IMPRESSION: 1. No evidence of metastatic disease or other acute findings. 2. Colonic diverticulosis, without radiographic evidence of diverticulitis. Aortic Atherosclerosis (ICD10-I70.0). Electronically Signed   By: Marlaine Hind M.D.   On: 11/08/2019 11:59   CT Chest Wo Contrast  Result Date: 11/08/2019 CLINICAL DATA:  Newly diagnosed endometrial carcinoma. Staging. Abdominal distension. Shortness of breath. EXAM: CT CHEST, ABDOMEN AND PELVIS WITHOUT CONTRAST TECHNIQUE: Multidetector CT imaging of the chest, abdomen and pelvis was performed following the standard protocol without IV contrast. COMPARISON:  None. FINDINGS: CT CHEST FINDINGS Cardiovascular: No acute findings. Aortic and coronary artery atherosclerosis noted. Mediastinum/Lymph Nodes: No masses or pathologically enlarged lymph nodes identified on this unenhanced exam. Lungs/Pleura: Mild pleural-parenchymal scarring is seen bilaterally. No evidence of pulmonary infiltrate pleural effusion.  No suspicious pulmonary nodules or masses identified. Musculoskeletal: No suspicious bone lesions identified. CT ABDOMEN AND PELVIS FINDINGS Hepatobiliary: No masses visualized on this unenhanced exam. Prior cholecystectomy. No evidence of biliary obstruction. Pancreas: No mass or inflammatory changes identified on this unenhanced exam.  Spleen: Within normal limits in size. Adrenals/Urinary Tract: No evidence of urolithiasis or hydronephrosis. Unremarkable appearance of bladder. Stomach/Bowel: No evidence of obstruction, inflammatory process, or abnormal fluid collections. Diverticulosis is seen mainly involving the sigmoid colon, however there is no evidence of diverticulitis. Vascular/Lymphatic: No pathologically enlarged lymph nodes identified. No abdominal aortic aneurysm. Aortic atherosclerosis noted. Reproductive: Uterus is normal in size. No adnexal mass or other significant abnormality identified. Other:  None. Musculoskeletal: No suspicious bone lesions identified. IMPRESSION: 1. No evidence of metastatic disease or other acute findings. 2. Colonic diverticulosis, without radiographic evidence of diverticulitis. Aortic Atherosclerosis (ICD10-I70.0). Electronically Signed   By: Marlaine Hind M.D.   On: 11/08/2019 11:59   CT Angio Chest PE W and/or Wo Contrast  Result Date: 11/13/2019 CLINICAL DATA:  Shortness of breath right leg pain IUD placed for endometrial cancer EXAM: CT ANGIOGRAPHY CHEST WITH CONTRAST TECHNIQUE: Multidetector CT imaging of the chest was performed using the standard protocol during bolus administration of intravenous contrast. Multiplanar CT image reconstructions and MIPs were obtained to evaluate the vascular anatomy. CONTRAST:  45mL OMNIPAQUE IOHEXOL 350 MG/ML SOLN COMPARISON:  Nov 08, 2019 FINDINGS: Cardiovascular: There is a optimal opacification of the pulmonary arteries. There is partially occlusive thrombus seen at the bifurcation of the right middle lobe and right lower lobe segmental and subsegmental pulmonary arterial branches. There is partially occlusive thrombus in the posterior right lower lobe subsegmental pulmonary arteries. There is partially occlusive thrombus at the bifurcation of the left segmental upper lobe and lower lobe pulmonary arterial branches which extend into the subsegmental branches.  No large central pulmonary embolism is seen. There is moderate cardiomegaly. No evidence of right ventricular heart strain. There is normal three-vessel brachiocephalic anatomy without proximal stenosis. The thoracic aorta is normal in appearance. Scattered aortic atherosclerosis. Mediastinum/Nodes: No hilar, mediastinal, or axillary adenopathy. Thyroid gland, trachea, and esophagus demonstrate no significant findings. Lungs/Pleura: The lungs are clear. No pleural effusion or pneumothorax. No airspace consolidation. Upper Abdomen: No acute abnormalities present in the visualized portions of the upper abdomen. Musculoskeletal: No chest wall abnormality. No acute or significant osseous findings. Review of the MIP images confirms the above findings. IMPRESSION: Partially occlusive segmental and subsegmental bilateral pulmonary emboli as described above within the right middle lobe, right lower lobe, left upper lobe and left lower lobes. No evidence of right ventricular heart strain. Aortic Atherosclerosis (ICD10-I70.0). These results were called by telephone at the time of interpretation on 11/13/2019 at 2:12 am to provider [facility] , who verbally acknowledged these results. Electronically Signed   By: Prudencio Pair M.D.   On: 11/13/2019 02:13   US Venous Img Lower Unilateral Right (DVT)  Result Date: 11/12/2019 CLINICAL DATA:  Right leg swelling and pain EXAM: Right LOWER EXTREMITY VENOUS DOPPLER ULTRASOUND TECHNIQUE: Gray-scale sonography with compression, as well as color and duplex ultrasound, were performed to evaluate the deep venous system(s) from the level of the common femoral vein through the popliteal and proximal calf veins. COMPARISON:  None. FINDINGS: VENOUS Normal compressibility of the common femoral, superficial femoral, and popliteal veins, as well as the visualized calf veins. Visualized portions of profunda femoral vein are unremarkable. Nonocclusive thrombus within the greater saphenous  vein below the knee. Vein  is noncompressible. No filling defects to suggest DVT on grayscale or color Doppler imaging. Doppler waveforms show normal direction of venous flow, normal respiratory plasticity and response to augmentation. Limited views of the contralateral common femoral vein are unremarkable. OTHER None. Limitations: none IMPRESSION: 1. Negative for acute deep venous thrombosis of the right lower extremity 2. Nonocclusive thrombus/superficial thrombophlebitis of the greater saphenous vein below the level of the knee. Electronically Signed   By: Donavan Foil M.D.   On: 11/12/2019 23:14       Subjective: Patient seen at bedside.  No acute events.  She is in good, cheerful spirits and happy to hear she gets to go home today.  She denies chest pain, shortness of breath or other complaints.    Discharge Exam: Vitals:   11/17/19 2107 11/18/19 0634  BP: (!) 163/68 (!) 141/57  Pulse: 60 60  Resp: 18 18  Temp: 97.8 F (36.6 C) 97.6 F (36.4 C)  SpO2: 98% 95%   Vitals:   11/17/19 0612 11/17/19 1332 11/17/19 2107 11/18/19 0634  BP: (!) 146/54 (!) 157/54 (!) 163/68 (!) 141/57  Pulse: 63 70 60 60  Resp: 20 18 18 18   Temp: 98.1 F (36.7 C) 98.3 F (36.8 C) 97.8 F (36.6 C) 97.6 F (36.4 C)  TempSrc:  Oral Oral Oral  SpO2: 96% 97% 98% 95%  Weight:      Height:        General: Pt is alert, awake, not in acute distress Cardiovascular: RRR, S1/S2 +, no rubs, no gallops Respiratory: CTA bilaterally, no wheezing, no rhonchi Abdominal: Soft, NT, ND, bowel sounds + Extremities: no edema, no cyanosis    The results of significant diagnostics from this hospitalization (including imaging, microbiology, ancillary and laboratory) are listed below for reference.     Microbiology: Recent Results (from the past 240 hour(s))  Urine Culture     Status: None   Collection Time: 11/10/19  4:12 PM   Specimen: Urine   UR  Result Value Ref Range Status   Urine Culture, Routine Final  report  Final   Organism ID, Bacteria No growth  Final  SARS Coronavirus 2 by RT PCR (hospital order, performed in [place] hospital lab) Nasopharyngeal Nasopharyngeal Swab     Status: None   Collection Time: 11/13/19  4:05 AM   Specimen: Nasopharyngeal Swab  Result Value Ref Range Status   SARS Coronavirus 2 NEGATIVE NEGATIVE Final    Comment: (NOTE) SARS-CoV-2 target nucleic acids are NOT DETECTED. The SARS-CoV-2 RNA is generally detectable in upper and lower respiratory specimens during the acute phase of infection. The lowest concentration of SARS-CoV-2 viral copies this assay can detect is 250 copies / mL. A negative result does not preclude SARS-CoV-2 infection and should not be used as the sole basis for treatment or other patient management decisions.  A negative result may occur with improper specimen collection / handling, submission of specimen other than nasopharyngeal swab, presence of viral mutation(s) within the areas targeted by this assay, and inadequate number of viral copies (<250 copies / mL). A negative result must be combined with clinical observations, patient history, and epidemiological information. Fact Sheet for Patients:   StrictlyIdeas.no Fact Sheet for Healthcare Providers: BankingDealers.co.za This test is not yet approved or cleared  by the [place] FDA and has been authorized for detection and/or diagnosis of SARS-CoV-2 by FDA under an Emergency Use Authorization (EUA).  This EUA will remain in effect (meaning this test can be used)  for the duration of the COVID-19 declaration under Section 564(b)(1) of the Act, 21 U.S.C. section 360bbb-3(b)(1), unless the authorization is terminated or revoked sooner. Performed at [facility], [address] [place], [place] [address]      Labs: BNP (last 3 results) No results for input(s): BNP in the last 8760 hours. Basic Metabolic  Panel: Recent Labs  Lab 11/12/19 2159 11/14/19 0846 11/17/19 0546  NA 139 139 137  K 4.5 4.3 4.6  CL 104 107 107  CO2 26 22 24   GLUCOSE 110* 127* 99  BUN 26* 21 18  CREATININE 1.40* 1.14* 1.05*  CALCIUM 8.6* 8.7* 8.6*  MG  --   --  2.2   Liver Function Tests: Recent Labs  Lab 11/12/19 2159  AST 12*  ALT 11  ALKPHOS 94  BILITOT 0.7  PROT 7.6  ALBUMIN 3.8   No results for input(s): LIPASE, AMYLASE in the last 168 hours. No results for input(s): AMMONIA in the last 168 hours. CBC: Recent Labs  Lab 11/14/19 1428 11/15/19 0452 11/16/19 0505 11/17/19 0546 11/18/19 0543  WBC 8.9 10.9* 9.7 9.6 9.9  HGB 11.5* 11.3* 10.7* 10.5* 10.8*  HCT 34.9* 34.8* 32.7* 32.7* 32.1*  MCV 87.9 91.3 90.1 91.3 88.7  PLT 209 241 179 218 216   Cardiac Enzymes: No results for input(s): CKTOTAL, CKMB, CKMBINDEX, TROPONINI in the last 168 hours. BNP: Invalid input(s): POCBNP CBG: No results for input(s): GLUCAP in the last 168 hours. D-Dimer No results for input(s): DDIMER in the last 72 hours. Hgb A1c No results for input(s): HGBA1C in the last 72 hours. Lipid Profile No results for input(s): CHOL, HDL, LDLCALC, TRIG, CHOLHDL, LDLDIRECT in the last 72 hours. Thyroid function studies No results for input(s): TSH, T4TOTAL, T3FREE, THYROIDAB in the last 72 hours.  Invalid input(s): FREET3 Anemia work up No results for input(s): VITAMINB12, FOLATE, FERRITIN, TIBC, IRON, RETICCTPCT in the last 72 hours. Urinalysis    Component Value Date/Time   COLORURINE YELLOW (A) 09/14/2017 1151   APPEARANCEUR CLOUDY (A) 09/14/2017 1151   LABSPEC 1.005 09/14/2017 1151   PHURINE 5.0 09/14/2017 1151   GLUCOSEU NEGATIVE 09/14/2017 1151   HGBUR NEGATIVE 09/14/2017 1151   BILIRUBINUR negative 06/16/2019 1322   BILIRUBINUR negative 01/16/2017 1223   KETONESUR negative 06/16/2019 1322   KETONESUR NEGATIVE 09/14/2017 1151   PROTEINUR =30 (A) 06/16/2019 1322   PROTEINUR NEGATIVE 09/14/2017 1151    UROBILINOGEN 0.2 06/16/2019 1322   NITRITE Negative 06/16/2019 1322   NITRITE NEGATIVE 09/14/2017 1151   LEUKOCYTESUR Moderate (2+) (A) 06/16/2019 1322   Sepsis Labs Invalid input(s): PROCALCITONIN,  WBC,  LACTICIDVEN Microbiology Recent Results (from the past 240 hour(s))  Urine Culture     Status: None   Collection Time: 11/10/19  4:12 PM   Specimen: Urine   UR  Result Value Ref Range Status   Urine Culture, Routine Final report  Final   Organism ID, Bacteria No growth  Final  SARS Coronavirus 2 by RT PCR (hospital order, performed in [place] hospital lab) Nasopharyngeal Nasopharyngeal Swab     Status: None   Collection Time: 11/13/19  4:05 AM   Specimen: Nasopharyngeal Swab  Result Value Ref Range Status   SARS Coronavirus 2 NEGATIVE NEGATIVE Final    Comment: (NOTE) SARS-CoV-2 target nucleic acids are NOT DETECTED. The SARS-CoV-2 RNA is generally detectable in upper and lower respiratory specimens during the acute phase of infection. The lowest concentration of SARS-CoV-2 viral copies this assay can detect  is 250 copies / mL. A negative result does not preclude SARS-CoV-2 infection and should not be used as the sole basis for treatment or other patient management decisions.  A negative result may occur with improper specimen collection / handling, submission of specimen other than nasopharyngeal swab, presence of viral mutation(s) within the areas targeted by this assay, and inadequate number of viral copies (<250 copies / mL). A negative result must be combined with clinical observations, patient history, and epidemiological information. Fact Sheet for Patients:   StrictlyIdeas.no Fact Sheet for Healthcare Providers: BankingDealers.co.za This test is not yet approved or cleared  by the [place] FDA and has been authorized for detection and/or diagnosis of SARS-CoV-2 by FDA under an Emergency Use Authorization (EUA).   This EUA will remain in effect (meaning this test can be used) for the duration of the COVID-19 declaration under Section 564(b)(1) of the Act, 21 U.S.C. section 360bbb-3(b)(1), unless the authorization is terminated or revoked sooner. Performed at [facility], [address] [place], [place] [address]      Time coordinating discharge: Over 30 minutes  SIGNED:   Ezekiel Slocumb, DO [facility] 11/18/2019, 8:58 AM   If 7PM-7AM, please contact night-coverage [email or web address]

## 2019-11-22 ENCOUNTER — Encounter: Payer: Self-pay | Admitting: Family Medicine

## 2019-11-22 DIAGNOSIS — I2699 Other pulmonary embolism without acute cor pulmonale: Secondary | ICD-10-CM

## 2019-11-22 NOTE — Telephone Encounter (Signed)
Pt was in the hospital on the 15th for PE. She states that the hospital wants her to have INR weekly. Please call daughter [name] @ [phone] or pt;s home number

## 2019-11-23 ENCOUNTER — Telehealth: Payer: Self-pay

## 2019-11-23 NOTE — Telephone Encounter (Signed)
Called and scheduled a hospital f/up and INR check for this Friday.  Cahterine Heinzel,cma

## 2019-11-23 NOTE — Telephone Encounter (Signed)
The patient needs to have an INR checked on Friday. Order placed.  Please call her and get this scheduled. She also needs to be scheduled for hospital follow-up.

## 2019-11-24 ENCOUNTER — Encounter: Payer: Self-pay | Admitting: Podiatry

## 2019-11-24 ENCOUNTER — Ambulatory Visit: Payer: PPO | Admitting: Podiatry

## 2019-11-24 ENCOUNTER — Other Ambulatory Visit: Payer: Self-pay

## 2019-11-24 DIAGNOSIS — M79676 Pain in unspecified toe(s): Secondary | ICD-10-CM

## 2019-11-24 DIAGNOSIS — I82812 Embolism and thrombosis of superficial veins of left lower extremities: Secondary | ICD-10-CM

## 2019-11-24 DIAGNOSIS — B351 Tinea unguium: Secondary | ICD-10-CM

## 2019-11-24 DIAGNOSIS — D696 Thrombocytopenia, unspecified: Secondary | ICD-10-CM

## 2019-11-24 DIAGNOSIS — N1831 Chronic kidney disease, stage 3a: Secondary | ICD-10-CM

## 2019-11-24 NOTE — Progress Notes (Signed)
This patient returns to my office for at risk foot care.  This patient requires this care by a professional since this patient will be at risk due to having history of thrombi and chronic kidney disease. Patient is accompanied to the office with her daughter.  This patient is unable to cut nails herself since the patient cannot reach her nails.These nails are painful walking and wearing shoes.  This patient presents for at risk foot care today. ° °General Appearance  Alert, conversant and in no acute stress. ° °Vascular  Dorsalis pedis and posterior tibial  pulses are  Weakly palpable  bilaterally.  Capillary return is within normal limits  bilaterally. Temperature is within normal limits  bilaterally. ° °Neurologic  Senn-Weinstein monofilament wire test within normal limits  bilaterally. Muscle power within normal limits bilaterally. ° °Nails Thick disfigured discolored nails with subungual debris  from hallux to fifth toes bilaterally. No evidence of bacterial infection or drainage bilaterally. ° °Orthopedic  No limitations of motion  feet .  No crepitus or effusions noted.  No bony pathology or digital deformities noted. Amputation fifth toe left.  Contracted fifth toe right foot. ° °Skin  normotropic skin with no porokeratosis noted bilaterally.  No signs of infections or ulcers noted.    ° °Onychomycosis  Pain in right toes  Pain in left toes ° °Consent was obtained for treatment procedures.   Mechanical debridement of nails 1-5  bilaterally performed with a nail nipper.  Filed with dremel without incident.  ° ° °Return office visit   3 months                  Told patient to return for periodic foot care and evaluation due to potential at risk complications. ° ° °Elmira Olkowski DPM  °

## 2019-11-25 ENCOUNTER — Telehealth: Payer: Self-pay | Admitting: Family Medicine

## 2019-11-25 ENCOUNTER — Telehealth: Payer: Self-pay

## 2019-11-25 ENCOUNTER — Ambulatory Visit (INDEPENDENT_AMBULATORY_CARE_PROVIDER_SITE_OTHER): Payer: PPO | Admitting: Family Medicine

## 2019-11-25 ENCOUNTER — Encounter: Payer: Self-pay | Admitting: Family Medicine

## 2019-11-25 VITALS — BP 130/70 | HR 75 | Temp 95.9°F | Ht 67.0 in | Wt 175.2 lb

## 2019-11-25 DIAGNOSIS — R791 Abnormal coagulation profile: Secondary | ICD-10-CM

## 2019-11-25 DIAGNOSIS — C541 Malignant neoplasm of endometrium: Secondary | ICD-10-CM | POA: Diagnosis not present

## 2019-11-25 DIAGNOSIS — Z7901 Long term (current) use of anticoagulants: Secondary | ICD-10-CM

## 2019-11-25 DIAGNOSIS — I10 Essential (primary) hypertension: Secondary | ICD-10-CM

## 2019-11-25 DIAGNOSIS — I2694 Multiple subsegmental pulmonary emboli without acute cor pulmonale: Secondary | ICD-10-CM | POA: Diagnosis not present

## 2019-11-25 DIAGNOSIS — D696 Thrombocytopenia, unspecified: Secondary | ICD-10-CM

## 2019-11-25 LAB — CBC
HCT: 36.9 % (ref 36.0–46.0)
Hemoglobin: 12.1 g/dL (ref 12.0–15.0)
MCHC: 32.7 g/dL (ref 30.0–36.0)
MCV: 90.8 fl (ref 78.0–100.0)
Platelets: 97 10*3/uL — ABNORMAL LOW (ref 150.0–400.0)
RBC: 4.06 Mil/uL (ref 3.87–5.11)
RDW: 14.4 % (ref 11.5–15.5)
WBC: 8.6 10*3/uL (ref 4.0–10.5)

## 2019-11-25 LAB — BASIC METABOLIC PANEL
BUN: 26 mg/dL — ABNORMAL HIGH (ref 6–23)
CO2: 23 mEq/L (ref 19–32)
Calcium: 8.7 mg/dL (ref 8.4–10.5)
Chloride: 104 mEq/L (ref 96–112)
Creatinine, Ser: 1.35 mg/dL — ABNORMAL HIGH (ref 0.40–1.20)
GFR: 36.84 mL/min — ABNORMAL LOW (ref >60.00)
Glucose, Bld: 161 mg/dL — ABNORMAL HIGH (ref 70–99)
Potassium: 4.5 mEq/L (ref 3.5–5.1)
Sodium: 138 mEq/L (ref 135–145)

## 2019-11-25 LAB — PROTIME-INR
INR: 7.8 ratio (ref 0.8–1.0)
Prothrombin Time: 85.3 s (ref 9.6–13.1)

## 2019-11-25 NOTE — Addendum Note (Signed)
Addended by: Leone Haven on: 11/25/2019 03:41 PM   Modules accepted: Orders

## 2019-11-25 NOTE — Patient Instructions (Addendum)
We will check labs today. If you need anything or have questions over the weekend please call the answering service. If you develop worsening shortness of breath or you develop chest pain please go to the ED.  Vitamin K Foods and Warfarin Warfarin is a blood thinner (anticoagulant). Anticoagulant medicines help prevent the formation of blood clots. These medicines work by decreasing the activity of vitamin K, which promotes normal blood clotting. When you take warfarin, problems can occur from suddenly increasing or decreasing the amount of vitamin K that you eat from one day to the next. Problems may include:  Blood clots.  Bleeding. What general guidelines do I need to follow? To avoid problems when taking warfarin:  Eat a balanced diet that includes: ? Fresh fruits and vegetables. ? Whole grains. ? Low-fat dairy products. ? Lean proteins, such as fish, eggs, and lean cuts of meat.  Keep your intake of vitamin K consistent from day to day. To do this: ? Avoid eating large amounts of vitamin K one day and low amounts of vitamin K the next day. ? If you take a multivitamin that contains vitamin K, be sure to take it every day. ? Know which foods contain vitamin K. Use the lists below to understand serving sizes and the amount of vitamin K in one serving.  Avoid major changes in your diet. If you are going to change your diet, talk with your health care provider before making changes.  Work with a Financial planner (dietitian) to develop a meal plan that works best for you.  High vitamin K foods Foods that are high in vitamin K contain more than 100 mcg (micrograms) per serving. These include:  Broccoli (cooked) -  cup has 110 mcg.  Brussels sprouts (cooked) -  cup has 109 mcg.  Greens, beet (cooked) -  cup has 350 mcg.  Greens, collard (cooked) -  cup has 418 mcg.  Greens, turnip (cooked) -  cup has 265 mcg.  Green onions or scallions -  cup has 105 mcg.  Kale  (fresh or frozen) -  cup has 531 mcg.  Parsley (raw) - 10 sprigs has 164 mcg.  Spinach (cooked) -  cup has 444 mcg.  Swiss chard (cooked) -  cup has 287 mcg. Moderate vitamin K foods Foods that have a moderate amount of vitamin K contain 25-100 mcg per serving. These include:  Asparagus (cooked) - 5 spears have 38 mcg.  Black-eyed peas (dried) -  cup has 32 mcg.  Cabbage (cooked) -  cup has 37 mcg.  Kiwi fruit - 1 medium has 31 mcg.  Lettuce - 1 cup has 57-63 mcg.  Okra (frozen) -  cup has 44 mcg.  Prunes (dried) - 5 prunes have 25 mcg.  Watercress (raw) - 1 cup has 85 mcg. Low vitamin K foods Foods low in vitamin K contain less than 25 mcg per serving. These include:  Artichoke - 1 medium has 18 mcg.  Avocado - 1 oz. has 6 mcg.  Blueberries -  cup has 14 mcg.  Cabbage (raw) -  cup has 21 mcg.  Carrots (cooked) -  cup has 11 mcg.  Cauliflower (raw) -  cup has 11 mcg.  Cucumber with peel (raw) -  cup has 9 mcg.  Grapes -  cup has 12 mcg.  Mango - 1 medium has 9 mcg.  Nuts - 1 oz. has 15 mcg.  Pear - 1 medium has 8 mcg.  Peas (cooked) -  cup  has 19 mcg.  Pickles - 1 spear has 14 mcg.  Pumpkin seeds - 1 oz. has 13 mcg.  Sauerkraut (canned) -  cup has 16 mcg.  Soybeans (cooked) -  cup has 16 mcg.  Tomato (raw) - 1 medium has 10 mcg.  Tomato sauce -  cup has 17 mcg. Vitamin K-free foods If a food contain less than 5 mcg per serving, it is considered to have no vitamin K. These foods include:  Bread and cereal products.  Cheese.  Eggs.  Fish and shellfish.  Meat and poultry.  Milk and dairy products.  Sunflower seeds. Actual amounts of vitamin K in foods may be different depending on processing. Talk with your dietitian about what foods you can eat and what foods you should avoid. This information is not intended to replace advice given to you by your health care provider. Make sure you discuss any questions you have with your  health care provider. Document Revised: 05/29/2017 Document Reviewed: 09/19/2015 Elsevier Patient Education  2020 [employer].

## 2019-11-25 NOTE — Telephone Encounter (Signed)
CRITICAL VALUE STICKER  CRITICAL VALUE:  -Protime 84.9 - INR 7.7  RECEIVER (on-site recipient of call): Hope   DATE & TIME NOTIFIED: 11/25/19; 3:08 pm  MESSENGER (representative from lab): Hope  MD NOTIFIED: Dr. Josephina Gip  TIME OF NOTIFICATION: 3:20  RESPONSE:

## 2019-11-25 NOTE — Telephone Encounter (Signed)
Please call the patient. Her INR is elevated at 7.8. This means her blood is too thin. If she is having any bleeding she needs to go to the ED for treatment. If she is not having any bleeding then she needs to stop her coumadin until we tell her to restart it. Please see if she has taken a dose today. If she has taken a dose today she will need to have her INR rechecked Sunday morning at the hospital. If she has not taken a dose today she should have it rechecked tomorrow morning at the lab at the hospital. If she develops bleeding or any headaches during this time she needs to go to the ED for evaluation. She needs to avoid cranberry juice moving forward.

## 2019-11-25 NOTE — Assessment & Plan Note (Signed)
Likely contributed to her PE.  She will follow up with gynecology.

## 2019-11-25 NOTE — Telephone Encounter (Signed)
I called the patient and I explained to the patient her INR results.  I informed the patient to stop taking the coumadin until Dr. Caryl Bis inform her to start again and she understood.  Patient stated she has not taken the dose of coumadin today, I informed her and her daughter to go to [facility];'s lab tomorrow and have her INR checked and I informed her that Dr. Caryl Bis or the on call provider will call her with the results.  I also informed her if she has any bleeding or headaches she is to go to the ER.  They both understood.

## 2019-11-25 NOTE — Telephone Encounter (Signed)
I spoke with Dr. Grayland Ormond regarding this patient's case.  Discussed the patient's recent history of PE and endometrial cancer.  Discussed that she was on heparin and transitioned to Coumadin while in the hospital.  Advised that I followed up with her today and her INR came back at 7.7 and later her platelets returned at 97 which is a greater than 50% drop.  I advised that she has not had any bleeding issues.  Discussed that I was not sure why her INR would be so high at this time as she had not had any dietary changes other than resumption of cranberry juice.  I wanted to get his input on what the next steps would be.  He advised holding the Coumadin.  He stated he would not reverse her at this time unless she had bleeding issues.  He advised rechecking her labs on 11/29/2019 and checking a HIT panel as well as a CBC and a citrate tube to eliminate the risk of platelet clumping.  He noted checking an INR that day would be acceptable.  He noted he would then consider transitioning her to Eliquis or Xarelto given her adequate creatinine.  He advise getting her set up with Dr. Mike Gip next week for follow-up as well.  I contacted the patient and her daughter and relayed this information.  Advised that she would not take any Coumadin until we advised her on what the next step would be.  I scheduled her for labs on 11/29/2019.  Advised that if she developed any bleeding issues, headache, chest pain, or shortness of breath she needs to go back to the emergency room.  They verbalized understanding.

## 2019-11-25 NOTE — Progress Notes (Signed)
Tommi Rumps, MD Phone: [phone]  Whitney Simmons is a 86 y.o. female who presents today for hospital follow-up.  Patient was hospitalized from 11/12/2019 -11/18/2019.  She was hospitalized for right leg pain and swelling and subsequently found to have a bilateral pulmonary embolism.  She recently had an IUD placed for endometrial cancer in the cancer and her inactivity likely contributed to the PE.  Given her low creatinine clearance she was placed on warfarin and was bridged with heparin drip.  Her INR was therapeutic x2 days prior to discharge.  She is currently taking warfarin 2.5 mg by mouth once daily.  She has had no chest pain.  She continues to have dyspnea on exertion.  She has no significant edema in her legs.  She has had no bleeding issues.  Her losartan dose was decreased in the hospital relating to low blood pressures.  Her BP has been stable similar to today or slightly above today at home.  She wonders about dietary issues with the Coumadin.  She notes she drinks cranberry juice daily to help prevent UTIs and notes she read that this could alter her INR.  Social History   Tobacco Use  Smoking Status Never Smoker  Smokeless Tobacco Never Used     ROS see history of present illness  Objective  Physical Exam Vitals:   11/25/19 1133  BP: 130/70  Pulse: 75  Temp: (!) 95.9 F (35.5 C)  SpO2: 97%    BP Readings from Last 3 Encounters:  11/25/19 130/70  11/18/19 (!) 129/55  11/09/19 138/76   Wt Readings from Last 3 Encounters:  11/25/19 175 lb 3.2 oz (79.5 kg)  11/12/19 173 lb (78.5 kg)  10/26/19 173 lb 9.6 oz (78.7 kg)    Physical Exam Constitutional:      General: She is not in acute distress.    Appearance: She is not diaphoretic.  Cardiovascular:     Rate and Rhythm: Normal rate and regular rhythm.     Heart sounds: Normal heart sounds.  Pulmonary:     Effort: Pulmonary effort is normal.     Breath sounds: Normal breath sounds.  Musculoskeletal:      Right lower leg: No edema.     Left lower leg: No edema.  Skin:    General: Skin is warm and dry.  Neurological:     Mental Status: She is alert.      Assessment/Plan: Please see individual problem list.  Pulmonary embolism ([place]) Patient is stable from hospitalization.  Continues to have some dyspnea on exertion likely related to her pulmonary embolus and deconditioning.  She will continue on Coumadin.  We will refer her to hematology.  She will have an INR checked today and we will contact her with the results.  She will return in 1 week for another INR.  Discussed vitamin K foods.  Advised not to vary her levels of vitamin K foods very much day-to-day.  Discussed that there have been some indications that cranberry juice may increase her INR.  She has been taking and cranberry juice since getting discharged and thus we will see how this is affected her INR.  Discussed seeking medical attention for excessive bleeding or difficult to stop bleeding or worsening symptoms.  We will also check a CBC and BMP.  Refer to hematology as well.  Hypertension Adequate control.  Continue current regimen.  Endometrial cancer, grade I ([place]) Likely contributed to her PE.  She will follow up with gynecology.  Orders Placed This Encounter  Procedures  . Protime-INR  . Basic Metabolic Panel (BMET)  . CBC    No orders of the defined types were placed in this encounter.   This visit occurred during the SARS-CoV-2 public health emergency.  Safety protocols were in place, including screening questions prior to the visit, additional usage of staff PPE, and extensive cleaning of exam room while observing appropriate contact time as indicated for disinfecting solutions.    Tommi Rumps, MD [place]

## 2019-11-25 NOTE — Assessment & Plan Note (Signed)
Adequate control. Continue current regimen.  

## 2019-11-25 NOTE — Assessment & Plan Note (Addendum)
Patient is stable from hospitalization.  Continues to have some dyspnea on exertion likely related to her pulmonary embolus and deconditioning.  She will continue on Coumadin.  We will refer her to hematology.  She will have an INR checked today and we will contact her with the results.  She will return in 1 week for another INR.  Discussed vitamin K foods.  Advised not to vary her levels of vitamin K foods very much day-to-day.  Discussed that there have been some indications that cranberry juice may increase her INR.  She has been taking and cranberry juice since getting discharged and thus we will see how this is affected her INR.  Discussed seeking medical attention for excessive bleeding or difficult to stop bleeding or worsening symptoms.  We will also check a CBC and BMP.  Refer to hematology as well.

## 2019-11-29 ENCOUNTER — Other Ambulatory Visit (INDEPENDENT_AMBULATORY_CARE_PROVIDER_SITE_OTHER): Payer: PPO

## 2019-11-29 ENCOUNTER — Telehealth: Payer: Self-pay

## 2019-11-29 ENCOUNTER — Other Ambulatory Visit: Payer: Self-pay

## 2019-11-29 DIAGNOSIS — R791 Abnormal coagulation profile: Secondary | ICD-10-CM | POA: Diagnosis not present

## 2019-11-29 DIAGNOSIS — D696 Thrombocytopenia, unspecified: Secondary | ICD-10-CM

## 2019-11-29 LAB — PROTIME-INR
INR: 4.1 ratio — ABNORMAL HIGH (ref 0.8–1.0)
Prothrombin Time: 45.2 s — ABNORMAL HIGH (ref 9.6–13.1)

## 2019-11-29 LAB — CBC
HCT: 36.1 % (ref 36.0–46.0)
Hemoglobin: 11.9 g/dL — ABNORMAL LOW (ref 12.0–15.0)
MCHC: 32.9 g/dL (ref 30.0–36.0)
MCV: 91.1 fl (ref 78.0–100.0)
Platelets: 198 10*3/uL (ref 150.0–400.0)
RBC: 3.96 Mil/uL (ref 3.87–5.11)
RDW: 14.5 % (ref 11.5–15.5)
WBC: 8.9 10*3/uL (ref 4.0–10.5)

## 2019-11-29 NOTE — Telephone Encounter (Signed)
Please contact the cancer center and see if you can get this patient scheduled this week. I spoke with Dr Grayland Ormond on Friday regarding this patient and he recommended having her follow-up this week with Dr Mike Gip.

## 2019-11-29 NOTE — Progress Notes (Signed)
[facility]  [address] [place], [place] [address] Phone: [phone]  Fax: [phone]   Clinic Day:  11/30/2019  Referring physician: Leone Haven, MD  Chief Complaint: Whitney Simmons is a 86 y.o. female with grade I endometrial cancer and multiple subsegmental pulmonary embolism who is seen for assessment after interval hospitalization.   HPI:  The patient notes a history of DCIS of the left breast 5 years ago. She underwent lumpectomy. She declined radiation and adjuvant endocrine therapy.  The patient presented with post-menopasual bleeding. Pelvic ultrasound on 09/05/2019 revealed a 2.2 cm echogenic mass within the endometrial canal.  She underwent D&C, Myosure/hysteroscopy on 10/18/2019.  Endometrim curettage revealed FIGO grade I endometrial adenocarcinoma, endometroid type.  She was seen in consultation by Dr Theora Gianotti on 10/26/2019.  She noted some vaginal spotting.  She was not interested in surgery.  Alternatives to surgery were discussed including Megace and Mirena progestin IUD.  She was not interested in radiation.  CT scans were ordered to r/o metastatic disease.  Mirena IUD was placed by Dr Gilman Schmidt on 11/09/2019.  Chest, abdomen, and pelvis CT on 11/08/2019 revealed no evidence of metastatic disease.  She noticed a pink spot on her medial right distal lower extremity on 05/13 or 11/11/2019.  She then developed edema.  She subsequently developed mild shortness of breath.  The patient was admitted to [facility] from 11/13/2019 - 11/18/2019.  She presented to [facility] ER on 11/12/2019 with shortness of breath and pain and swelling of the right lower extremity. Right lower extremity duplex revealed a nonocclusive thrombus/superficial thrombophlebitis of the greater saphenous vein below the level of the knee.  Chest CT angiogram on 11/13/2019 revealed a partially occlusive segmental and subsegmental bilateral pulmonary emboli within the right  middle lobe, right lower lobe, left upper lobe and left lower lobes.  There was no evidence of right ventricular heart strain.    Heparin was initiated. Creatinine was 1.40 (CrCl 29.4 ml/min).  She was converted to Coumadin.  She received Coumadin 5 mg on 05/17, 5 mg on 05/18, 5 mg on 05/19, and 2.5 mg on 11/17/2019.  She was discharged on Coumadin 2.5 mg a day.  The patient was seen by Dr. Caryl Bis on 11/25/2019.  She denied any chest pain and bleeding issues. She continued to have dyspnea on exertion. She had no significant edema in her legs.  Creatinine was 1.35.  Hematocrit was 36.9, hemoglobin 12.1, MCV 90.8, platelets 97,000, and WBC 8600.  PT was 85.3 and INR 7.8. Coumadin was discontinued.  She presented to [facility] ER on 11/29/2019 with vaginal bleeding and cramping.  Hematocrit was 36.1, hemoglobin 11.9, MCV 91.1, platelets 198,000, WBC 8900. INR was 4.1.   INR has been followed: 1.1 on 11/13/2019, 1.4 on 11/16/2019, 2.1 on 11/17/2019, 2.7 on 11/18/2019, 7.8 on 11/25/2019, 4.1 on 11/29/2019 and 3.0 on 11/30/2019.  Symptomatically, she is doing fine.  However, she "could be better". Patient has not taken Coumadin since Thursday. She was at the ER this morning due to increased vaginal bleeding and cramping that felt like menstrual cramps. Her vaginal bleeding has been getting heavier than usual the last few days, especially this morning. She says she controls her diet well. She drinks cranberry juice every day to prevent UTI's. She is currently not on any antibiotics. She says her energy level is a 5. She is not very active, but when she is active she can become winded.  She is usually stressed. She  denies all other bleeding besides vaginal bleeding. She notes her platelets stick and she has to have them drawn in a certain tube.   She has pseudothrombocytopenia.   Past Medical History:  Diagnosis Date  . Breast microcalcification, mammographic 01/31/2014  . Cancer ([place]) 02-15-14   DCIS left  breast, 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 Declined postoperative radiation and antiestrogen therapy.  . Edema   . Hyperlipidemia   . Hypertension   . Hypothyroidism   . IBS (irritable bowel syndrome)   . Irregular heart beat   . stage III kidney disease   . Thyroid disease    s/p radioactive iodine ablation 30 years ago    Past Surgical History:  Procedure Laterality Date  . BREAST LUMPECTOMY Left 2015   DUCTAL CARCINOMA IN SITU WITH CALCIFICATIONS  . BREAST SURGERY Left 02-15-14   wide excision  . CATARACT EXTRACTION    . CHOLECYSTECTOMY    . HYSTEROSCOPY WITH D & C N/A 10/18/2019   Procedure: DILATATION AND CURETTAGE /HYSTEROSCOPY;  Surgeon: Homero Fellers, MD;  Location: [facility] ORS;  Service: Gynecology;  Laterality: N/A;  . TOE AMPUTATION     left 5th toe    Family History  Problem Relation Age of Onset  . Heart disease Mother   . Heart disease Father   . Heart attack Father   . Heart disease Maternal Aunt   . Heart disease Brother   . AAA (abdominal aortic aneurysm) Other   . Stroke Maternal Aunt   . Breast cancer Neg Hx     Social History:  reports that she has never smoked. She has never used smokeless tobacco. She reports that she does not drink alcohol or use drugs. The patient denies any exposure to radiation or toxins.  She is a retired Pharmacist, hospital.  The patient lives in [place] at [place].  Her husband is in a care facility in [facility]  Her daughter from [place] is currently in town. The patient is accompanied by her daughter today.  Allergies:  Allergies  Allergen Reactions  . Levaquin [Levofloxacin In D5w] Other (See Comments)    hallucinations  . Sulfa Antibiotics     Hallucinations     Current Medications: Current Outpatient Medications  Medication Sig Dispense Refill  . furosemide (LASIX) 20 MG tablet Take 1 tablet (20 mg total) by mouth daily. 90 tablet 2  . latanoprost (XALATAN) 0.005 % ophthalmic solution Place 1 drop  into both eyes at bedtime.     Marland Kitchen levothyroxine (EUTHYROX) 88 MCG tablet TAKE 1 TABLET BY MOUTH ONCE DAILY BEFORE BREAKFAST 90 tablet 3  . losartan (COZAAR) 25 MG tablet Take 1 tablet (25 mg total) by mouth daily. 30 tablet 1  . Multiple Vitamin (MULTIVITAMIN) tablet Take 1 tablet by mouth daily.    . nebivolol (BYSTOLIC) 5 MG tablet Take 1 tablet (5 mg total) by mouth daily. 90 tablet 2  . aspirin EC 81 MG tablet Take 81 mg by mouth daily.    Marland Kitchen warfarin (COUMADIN) 2.5 MG tablet Take 1 tablet (2.5 mg total) by mouth daily. (Patient not taking: Reported on 11/30/2019) 30 tablet 1   No current facility-administered medications for this visit.    Review of Systems  Constitutional: Negative for chills, diaphoresis, fever, malaise/fatigue and weight loss.       She "could be better".  HENT: Positive for hearing loss. Negative for congestion and sore throat.   Eyes: Negative for blurred vision and double vision.  Respiratory: Positive for shortness of breath (exertional). Negative for cough, hemoptysis, sputum production and wheezing.   Cardiovascular: Negative for chest pain, palpitations and leg swelling (right leg; improved).  Gastrointestinal: Negative for abdominal pain, blood in stool, constipation, diarrhea, heartburn, melena, nausea and vomiting.       Diet is good. Drinks cranberry juice everyday.   Genitourinary: Negative for dysuria, frequency and urgency.       Vaginal bleeding.   Musculoskeletal: Negative for back pain, falls, joint pain, myalgias and neck pain.  Skin: Negative for itching and rash.  Neurological: Negative for dizziness, sensory change, weakness and headaches.  Endo/Heme/Allergies: Negative for environmental allergies. Does not bruise/bleed easily.  Psychiatric/Behavioral: Negative for depression and memory loss. The patient is nervous/anxious (sometimes from stress). The patient does not have insomnia.        Easily stressed.    Performance status (ECOG):   1-2  Vitals Blood pressure 134/68, pulse 88, resp. rate 18, weight 168 lb (76.2 kg), SpO2 99 %.   Physical Exam  Constitutional: She is oriented to person, place, and time. She appears well-developed and well-nourished. No distress. Face mask in place.  Elderly woman sitting comfortably in the exam room in no acute distress. She has a rolling walker by her side.   HENT:  Head: Normocephalic and atraumatic.  Mouth/Throat: Oropharynx is clear and moist and mucous membranes are normal. No oral lesions.  Short gray hair.   Eyes: Pupils are equal, round, and reactive to light. Conjunctivae and EOM are normal. Right eye exhibits no discharge. Left eye exhibits no discharge. No scleral icterus.  Glasses.  Blue eyes.   Neck: No JVD present.  Cardiovascular: Normal rate, regular rhythm and normal heart sounds. Exam reveals no gallop and no friction rub.  No murmur heard. Pulmonary/Chest: Effort normal and breath sounds normal. No respiratory distress. She has no wheezes. She has no rhonchi. She has no rales. She exhibits no tenderness.  Abdominal: Soft. Normal appearance and bowel sounds are normal. She exhibits no distension and no mass. There is no hepatosplenomegaly. There is no abdominal tenderness. There is no rebound, no guarding and no CVA tenderness.  Musculoskeletal:        General: Tenderness (around ankles) present. No edema. Normal range of motion.     Cervical back: Normal range of motion and neck supple.  Lymphadenopathy:       Head (right side): No preauricular, no posterior auricular and no occipital adenopathy present.       Head (left side): No preauricular, no posterior auricular and no occipital adenopathy present.    She has no cervical adenopathy.    She has no axillary adenopathy.       Right: No inguinal and no supraclavicular adenopathy present.       Left: No inguinal and no supraclavicular adenopathy present.  Neurological: She is alert and oriented to person, place,  and time.  Skin: Skin is warm, dry and intact. No bruising, no lesion and no rash noted. She is not diaphoretic. No erythema. No pallor.  Psychiatric: She has a normal mood and affect. Her behavior is normal. Judgment and thought content normal.  Nursing note and vitals reviewed.   Admission on 11/30/2019, Discharged on 11/30/2019  Component Date Value Ref Range Status  . WBC 11/30/2019 10.1  4.0 - 10.5 K/uL Final  . RBC 11/30/2019 4.13  3.87 - 5.11 MIL/uL Final  . Hemoglobin 11/30/2019 12.0  12.0 - 15.0 g/dL Final  . HCT 11/30/2019  36.6  36.0 - 46.0 % Final  . MCV 11/30/2019 88.6  80.0 - 100.0 fL Final  . MCH 11/30/2019 29.1  26.0 - 34.0 pg Final  . MCHC 11/30/2019 32.8  30.0 - 36.0 g/dL Final  . RDW 11/30/2019 13.8  11.5 - 15.5 % Final  . Platelets 11/30/2019 259  150 - 400 K/uL Final  . nRBC 11/30/2019 0.0  0.0 - 0.2 % Final  . Neutrophils Relative % 11/30/2019 69  % Final  . Neutro Abs 11/30/2019 6.9  1.7 - 7.7 K/uL Final  . Lymphocytes Relative 11/30/2019 19  % Final  . Lymphs Abs 11/30/2019 1.9  0.7 - 4.0 K/uL Final  . Monocytes Relative 11/30/2019 9  % Final  . Monocytes Absolute 11/30/2019 0.9  0.1 - 1.0 K/uL Final  . Eosinophils Relative 11/30/2019 2  % Final  . Eosinophils Absolute 11/30/2019 0.2  0.0 - 0.5 K/uL Final  . Basophils Relative 11/30/2019 1  % Final  . Basophils Absolute 11/30/2019 0.1  0.0 - 0.1 K/uL Final  . Immature Granulocytes 11/30/2019 0  % Final  . Abs Immature Granulocytes 11/30/2019 0.03  0.00 - 0.07 K/uL Final   Performed at [facility], [address], [place], [place] [address]  . Sodium 11/30/2019 138  135 - 145 mmol/L Final  . Potassium 11/30/2019 4.0  3.5 - 5.1 mmol/L Final  . Chloride 11/30/2019 106  98 - 111 mmol/L Final  . CO2 11/30/2019 22  22 - 32 mmol/L Final  . Glucose, Bld 11/30/2019 107* 70 - 99 mg/dL Final   Glucose reference range applies only to samples taken after fasting for at least 8 hours.  . BUN 11/30/2019 30* 8 -  23 mg/dL Final  . Creatinine, Ser 11/30/2019 1.45* 0.44 - 1.00 mg/dL Final  . Calcium 11/30/2019 8.8* 8.9 - 10.3 mg/dL Final  . Total Protein 11/30/2019 7.0  6.5 - 8.1 g/dL Final  . Albumin 11/30/2019 3.7  3.5 - 5.0 g/dL Final  . AST 11/30/2019 12* 15 - 41 U/L Final  . ALT 11/30/2019 13  0 - 44 U/L Final  . Alkaline Phosphatase 11/30/2019 77  38 - 126 U/L Final  . Total Bilirubin 11/30/2019 0.9  0.3 - 1.2 mg/dL Final  . GFR calc non Af Amer 11/30/2019 32* >60 mL/min Final  . GFR calc Af Amer 11/30/2019 37* >60 mL/min Final  . Anion gap 11/30/2019 10  5 - 15 Final   Performed at [facility], [address], [place], [place] [address]  . Prothrombin Time 11/30/2019 29.8* 11.4 - 15.2 seconds Final  . INR 11/30/2019 3.0* 0.8 - 1.2 Final   Comment: (NOTE) INR goal varies based on device and disease states. Performed at [facility], [address], [place], [place] [address]   . ABO/RH(D) 11/30/2019 O POS   Final  . Antibody Screen 11/30/2019 NEG   Final  . Sample Expiration 11/30/2019    Final                   Value:12/03/2019,2359 Performed at [facility], [address] [place], [place] [address]   Lab on 11/29/2019  Component Date Value Ref Range Status  . INR 11/29/2019 4.1* 0.8 - 1.0 ratio Final  . Prothrombin Time 11/29/2019 45.2* 9.6 - 13.1 sec Final  . WBC 11/29/2019 8.9  4.0 - 10.5 K/uL Final  . RBC 11/29/2019 3.96  3.87 - 5.11 Mil/uL Final  . Platelets 11/29/2019 198.0 Repeated  and verified X2.  150.0 - 400.0 K/uL Final  . Hemoglobin 11/29/2019 11.9* 12.0 - 15.0 g/dL Final  . HCT 11/29/2019 36.1  36.0 - 46.0 % Final  . MCV 11/29/2019 91.1  78.0 - 100.0 fl Final  . MCHC 11/29/2019 32.9  30.0 - 36.0 g/dL Final  . RDW 11/29/2019 14.5  11.5 - 15.5 % Final    Assessment:  Whitney Simmons is a 86 y.o. female with localizedendometrial canceradmitted with right lower extremity thrombosis and bilateral pulmonary emboli.Chest, abdomen, and  pelvis CT on 11/08/2019 revealed no evidence of metastatic disease.Mirena IUDwas placed on 11/09/2019.  Right lower extremity duplexon 05/15/2021revealed a nonocclusive thrombus/superficial thrombophlebitisof the greater saphenous vein below the level of the knee.  Chest CT angiogramon 11/13/2019 revealed a partially occlusive segmental and subsegmental bilateral pulmonary emboli within the right middle lobe, right lower lobe, left upper lobe and left lower lobes.There was no evidence of right ventricular heart strain.  Hypercoagulable work-up on 11/04/2019 was negative for the following: factor V Leiden, prothrombin gene mutation, lupus anticoagulant panel, anticardiolipin antibodies and beta-2 glycoprotein antibodies.  She is currently off Coumadin.  She received Coumadin 5 mg on 05/17, 5 mg on 05/18, 5 mg on 05/19, and 2.5 mg on 11/17/2019.  She was on Coumadin 2.5 mg/day.  Coumadin was discontinued on 11/25/2019.  INR has been followed: 1.1 on 11/13/2019, 1.4 on 11/16/2019, 2.1 on 11/17/2019, 2.7 on 11/18/2019, 7.8 on 11/25/2019, 4.1 on 11/29/2019 and 3.0 on 11/30/2019.  She has chronic renal insufficiency.Creatinine was 1.47(CrCl 27.4 ml/min) on 11/30/2019.  She has pseudothrombocytopenia.    Symptomatically,  she is not very active.  She has shortness of breath on exertion.  She has had some vaginal bleeding.  Hemoglobin is 12.0 and platelets 259,000.  INR is 3.0.  Plan: 1.   Review labs during hospitalization and yesterday in the ER. 2.   Endometrial cancer She has localized disease. She declined surgery and opted for a Mirena IUD (placed 11/09/2019). Mirena IUD can be associated with thrombosis, but typically very little systemic absorption. Megace contraindicated (higher risk of thrombosis). Surgery and radiation were previously discussed with patient.             Plan for IUD to remain in  place.             Follow-up with Dr Theora Gianotti 02/22/2020 in the outpatient department. 3. Bilateral pulmonary emboli and right superficial thrombophlebitis Patient acutely developed symptoms after IUD placement. Etiology of thrombosis likely multi-factorial: age, malignancy, decreased mobility. Hypercoagulable was negative.  Patient was on heparin then converted to Coumadin secondary to CrCl < 30. Coumadin has been held since 11/25/2019 secondary to elevated INR (7.8).                         Patient denies any change in diet diet, diarrhea, antibiotics. Discussed likely for low-dose Coumadin secondary to rapid increase in INR with initiation of therapy and supratherapeutic INR recently.  Hold Coumadin tonight.  Anticipate low-dose Coumadin (1 mg alternating with 2 mg) given initial trends. 4.   RTC in [place] on 06/03 and 06/04 for PT/INR. 5.   Please ensure patient has slips for PT/INR over the weekend. 6.   RTC next week in [place] on 12/05/2019 for PT/INR. 7.   RTC in 1 week for MD assessment and labs (CBC with diff, platelet count in a blue top tube, and PT/INR).  I discussed the assessment and treatment plan with  the patient.  The patient was provided an opportunity to ask questions and all were answered.  The patient agreed with the plan and demonstrated an understanding of the instructions.  The patient was advised to call back if the symptoms worsen or if the condition fails to improve as anticipated.  I provided 25 minutes (4:23 PM - 4:48 PM) of face-to-face time during this this encounter and > 50% was spent counseling as documented under my assessment and plan.  An additional 10-15 minutes were spent reviewing her chart (Epic and Care Everywhere) including notes, labs, and imaging studies.    Lequita Asal, MD, PhD    11/30/2019, 4:23 PM  I, Heywood Footman, am acting as Education administrator for [employer]. Mike Gip, MD,  PhD.  I, Sidhant Helderman C. Mike Gip, MD, have reviewed the above documentation for accuracy and completeness, and I agree with the above.

## 2019-11-29 NOTE — Telephone Encounter (Signed)
See other phone note

## 2019-11-29 NOTE — Telephone Encounter (Signed)
Noted Dr. Ellen Henri phone note. Message sent to Dr. Mike Gip for her team to assist in arranging appointment.

## 2019-11-29 NOTE — Addendum Note (Signed)
Addended by: Leone Haven on: 11/29/2019 08:24 AM   Modules accepted: Orders

## 2019-11-30 ENCOUNTER — Telehealth: Payer: Self-pay | Admitting: Obstetrics and Gynecology

## 2019-11-30 ENCOUNTER — Other Ambulatory Visit: Payer: Self-pay

## 2019-11-30 ENCOUNTER — Encounter: Payer: Self-pay | Admitting: Hematology and Oncology

## 2019-11-30 ENCOUNTER — Emergency Department
Admission: EM | Admit: 2019-11-30 | Discharge: 2019-11-30 | Disposition: A | Discharge status: Home / Self Care | Payer: PPO | Attending: Emergency Medicine | Admitting: Emergency Medicine

## 2019-11-30 ENCOUNTER — Inpatient Hospital Stay: Payer: PPO | Attending: Hematology and Oncology | Admitting: Hematology and Oncology

## 2019-11-30 ENCOUNTER — Telehealth: Payer: Self-pay | Admitting: *Deleted

## 2019-11-30 ENCOUNTER — Encounter: Payer: Self-pay | Admitting: Emergency Medicine

## 2019-11-30 ENCOUNTER — Telehealth: Payer: Self-pay

## 2019-11-30 VITALS — BP 134/68 | HR 88 | Resp 18 | Wt 168.0 lb

## 2019-11-30 DIAGNOSIS — Z5321 Procedure and treatment not carried out due to patient leaving prior to being seen by health care provider: Secondary | ICD-10-CM | POA: Diagnosis not present

## 2019-11-30 DIAGNOSIS — N939 Abnormal uterine and vaginal bleeding, unspecified: Secondary | ICD-10-CM | POA: Diagnosis not present

## 2019-11-30 DIAGNOSIS — Z7189 Other specified counseling: Secondary | ICD-10-CM | POA: Diagnosis not present

## 2019-11-30 DIAGNOSIS — Z7901 Long term (current) use of anticoagulants: Secondary | ICD-10-CM | POA: Insufficient documentation

## 2019-11-30 DIAGNOSIS — R898 Other abnormal findings in specimens from other organs, systems and tissues: Secondary | ICD-10-CM

## 2019-11-30 DIAGNOSIS — I2699 Other pulmonary embolism without acute cor pulmonale: Secondary | ICD-10-CM | POA: Diagnosis not present

## 2019-11-30 DIAGNOSIS — R0902 Hypoxemia: Secondary | ICD-10-CM | POA: Diagnosis not present

## 2019-11-30 DIAGNOSIS — I8001 Phlebitis and thrombophlebitis of superficial vessels of right lower extremity: Secondary | ICD-10-CM | POA: Insufficient documentation

## 2019-11-30 DIAGNOSIS — R5381 Other malaise: Secondary | ICD-10-CM | POA: Diagnosis not present

## 2019-11-30 DIAGNOSIS — R0609 Other forms of dyspnea: Secondary | ICD-10-CM | POA: Diagnosis not present

## 2019-11-30 DIAGNOSIS — R58 Hemorrhage, not elsewhere classified: Secondary | ICD-10-CM | POA: Diagnosis not present

## 2019-11-30 DIAGNOSIS — N189 Chronic kidney disease, unspecified: Secondary | ICD-10-CM | POA: Diagnosis not present

## 2019-11-30 DIAGNOSIS — R791 Abnormal coagulation profile: Secondary | ICD-10-CM

## 2019-11-30 DIAGNOSIS — I2694 Multiple subsegmental pulmonary emboli without acute cor pulmonale: Secondary | ICD-10-CM

## 2019-11-30 DIAGNOSIS — C541 Malignant neoplasm of endometrium: Secondary | ICD-10-CM

## 2019-11-30 DIAGNOSIS — I959 Hypotension, unspecified: Secondary | ICD-10-CM | POA: Diagnosis not present

## 2019-11-30 LAB — COMPREHENSIVE METABOLIC PANEL
ALT: 13 U/L (ref 0–44)
AST: 12 U/L — ABNORMAL LOW (ref 15–41)
Albumin: 3.7 g/dL (ref 3.5–5.0)
Alkaline Phosphatase: 77 U/L (ref 38–126)
Anion gap: 10 (ref 5–15)
BUN: 30 mg/dL — ABNORMAL HIGH (ref 8–23)
CO2: 22 mmol/L (ref 22–32)
Calcium: 8.8 mg/dL — ABNORMAL LOW (ref 8.9–10.3)
Chloride: 106 mmol/L (ref 98–111)
Creatinine, Ser: 1.45 mg/dL — ABNORMAL HIGH (ref 0.44–1.00)
GFR calc Af Amer: 37 mL/min — ABNORMAL LOW (ref >60)
GFR calc non Af Amer: 32 mL/min — ABNORMAL LOW (ref >60)
Glucose, Bld: 107 mg/dL — ABNORMAL HIGH (ref 70–99)
Potassium: 4 mmol/L (ref 3.5–5.1)
Sodium: 138 mmol/L (ref 135–145)
Total Bilirubin: 0.9 mg/dL (ref 0.3–1.2)
Total Protein: 7 g/dL (ref 6.5–8.1)

## 2019-11-30 LAB — CBC WITH DIFFERENTIAL/PLATELET
Abs Immature Granulocytes: 0.03 10*3/uL (ref 0.00–0.07)
Basophils Absolute: 0.1 10*3/uL (ref 0.0–0.1)
Basophils Relative: 1 %
Eosinophils Absolute: 0.2 10*3/uL (ref 0.0–0.5)
Eosinophils Relative: 2 %
HCT: 36.6 % (ref 36.0–46.0)
Hemoglobin: 12 g/dL (ref 12.0–15.0)
Immature Granulocytes: 0 %
Lymphocytes Relative: 19 %
Lymphs Abs: 1.9 10*3/uL (ref 0.7–4.0)
MCH: 29.1 pg (ref 26.0–34.0)
MCHC: 32.8 g/dL (ref 30.0–36.0)
MCV: 88.6 fL (ref 80.0–100.0)
Monocytes Absolute: 0.9 10*3/uL (ref 0.1–1.0)
Monocytes Relative: 9 %
Neutro Abs: 6.9 10*3/uL (ref 1.7–7.7)
Neutrophils Relative %: 69 %
Platelets: 259 10*3/uL (ref 150–400)
RBC: 4.13 MIL/uL (ref 3.87–5.11)
RDW: 13.8 % (ref 11.5–15.5)
WBC: 10.1 10*3/uL (ref 4.0–10.5)
nRBC: 0 % (ref 0.0–0.2)

## 2019-11-30 LAB — PROTIME-INR
INR: 3 — ABNORMAL HIGH (ref 0.8–1.2)
Prothrombin Time: 29.8 seconds — ABNORMAL HIGH (ref 11.4–15.2)

## 2019-11-30 LAB — TYPE AND SCREEN
ABO/RH(D): O POS
Antibody Screen: NEGATIVE

## 2019-11-30 MED ORDER — WARFARIN SODIUM 1 MG PO TABS: ORAL_TABLET | ORAL | 1 refills | Status: DC

## 2019-11-30 NOTE — ED Triage Notes (Signed)
Pt to triage via w/c with no distress noted, mask in place; EMS brought pt in from [facility]; pt had IUD placed several wks ago for endometrial CA; coumadin was d/c'd last Friday; st increased vag bleeding with some cramping

## 2019-11-30 NOTE — Telephone Encounter (Signed)
Patient's daughter [name] is calling to cancel request for ultrasound for today at 4:30. She reports patient never made it back to see anyone in the ER and decided to go home. She is going to with her hematologist Dr. Nolon Stalls. Will call back to schedule ultrasound at another time.

## 2019-11-30 NOTE — ED Notes (Signed)
Pts daughter reports she spoke with pts MD and they have ordered an [place] and will see them this afternoon.

## 2019-11-30 NOTE — Telephone Encounter (Signed)
Pt was redrawn yesterday & I also drew a PT/INR. Pt had a previous lab appt scheduled for this Friday. Please let CMA know if this needs to be cancelled. Note, pt is currently at ER (see notes)

## 2019-11-30 NOTE — Telephone Encounter (Signed)
She will need this redrawn on Friday. We are waiting for her INR to decrease to a normal level.

## 2019-11-30 NOTE — Progress Notes (Signed)
Coumadin or try something else? Platelets stick and she has to have them drawn in a certain tube. Patient has not taken coumadin since Thursday. Was at the ED this morning due to increased vaginal bleeding and cramping that feels like menstrual cramps.

## 2019-11-30 NOTE — ED Notes (Signed)
Pt leaving with daughter. Pt has an appt this after noon with MD

## 2019-11-30 NOTE — Telephone Encounter (Signed)
-----   Message from Whitney Haven, MD sent at 11/30/2019  4:22 PM EDT ----- Please let the patient know that her INR has trended down though does still remain elevated. Please see if she can tell us what the plan was from her visit with Dr Mike Gip. Her platelets are in the normal range.

## 2019-12-01 ENCOUNTER — Inpatient Hospital Stay: Payer: PPO

## 2019-12-01 ENCOUNTER — Telehealth: Payer: Self-pay | Admitting: Hematology and Oncology

## 2019-12-01 DIAGNOSIS — C541 Malignant neoplasm of endometrium: Secondary | ICD-10-CM

## 2019-12-01 DIAGNOSIS — I2694 Multiple subsegmental pulmonary emboli without acute cor pulmonale: Secondary | ICD-10-CM

## 2019-12-01 DIAGNOSIS — I8001 Phlebitis and thrombophlebitis of superficial vessels of right lower extremity: Secondary | ICD-10-CM

## 2019-12-01 LAB — PROTIME-INR
INR: 2.5 — ABNORMAL HIGH (ref 0.8–1.2)
Prothrombin Time: 26.3 seconds — ABNORMAL HIGH (ref 11.4–15.2)

## 2019-12-01 NOTE — Telephone Encounter (Signed)
Re:  INR  Called patient regarding INR of 2.5.  Requested she take Coumadin 1 mg tonight.  She will have labs repeated tomorrow.   Lequita Asal, MD

## 2019-12-02 ENCOUNTER — Telehealth: Payer: Self-pay | Admitting: *Deleted

## 2019-12-02 ENCOUNTER — Other Ambulatory Visit: Payer: Self-pay

## 2019-12-02 ENCOUNTER — Inpatient Hospital Stay: Payer: PPO

## 2019-12-02 ENCOUNTER — Other Ambulatory Visit: Payer: PPO

## 2019-12-02 DIAGNOSIS — I8001 Phlebitis and thrombophlebitis of superficial vessels of right lower extremity: Secondary | ICD-10-CM

## 2019-12-02 DIAGNOSIS — I2694 Multiple subsegmental pulmonary emboli without acute cor pulmonale: Secondary | ICD-10-CM

## 2019-12-02 DIAGNOSIS — C541 Malignant neoplasm of endometrium: Secondary | ICD-10-CM | POA: Diagnosis not present

## 2019-12-02 LAB — PROTIME-INR
INR: 2.3 — ABNORMAL HIGH (ref 0.8–1.2)
Prothrombin Time: 24.3 seconds — ABNORMAL HIGH (ref 11.4–15.2)

## 2019-12-02 NOTE — Telephone Encounter (Signed)
Called patient to assure that she understood that she was to go to [facility] on Saturday and Sunday for lab draws for PT/INR. Patient verbalized understanding of directions. Writer advised patient that Dr. Mike Gip would call if changes in plan needed to be made based on her lab values.

## 2019-12-03 ENCOUNTER — Other Ambulatory Visit
Admission: RE | Admit: 2019-12-03 | Discharge: 2019-12-03 | Disposition: A | Discharge status: Home / Self Care | Payer: PPO | Source: Ambulatory Visit | Attending: Hematology and Oncology | Admitting: Hematology and Oncology

## 2019-12-03 ENCOUNTER — Telehealth: Payer: Self-pay | Admitting: Hematology and Oncology

## 2019-12-03 DIAGNOSIS — I8001 Phlebitis and thrombophlebitis of superficial vessels of right lower extremity: Secondary | ICD-10-CM | POA: Diagnosis not present

## 2019-12-03 DIAGNOSIS — I2694 Multiple subsegmental pulmonary emboli without acute cor pulmonale: Secondary | ICD-10-CM | POA: Diagnosis not present

## 2019-12-03 DIAGNOSIS — C541 Malignant neoplasm of endometrium: Secondary | ICD-10-CM | POA: Diagnosis not present

## 2019-12-03 LAB — PROTIME-INR
INR: 2 — ABNORMAL HIGH (ref 0.8–1.2)
Prothrombin Time: 22 seconds — ABNORMAL HIGH (ref 11.4–15.2)

## 2019-12-03 NOTE — Telephone Encounter (Signed)
Re:  INR  Called patient's daughter regarding INR of 2.0.  Requested her mother take Coumadin 2 mg tonight.  She will have labs repeated tomorrow.   Lequita Asal, MD

## 2019-12-04 ENCOUNTER — Other Ambulatory Visit
Admission: RE | Admit: 2019-12-04 | Discharge: 2019-12-04 | Disposition: A | Discharge status: Home / Self Care | Payer: PPO | Source: Ambulatory Visit | Attending: Hematology and Oncology | Admitting: Hematology and Oncology

## 2019-12-04 ENCOUNTER — Telehealth: Payer: Self-pay | Admitting: Hematology and Oncology

## 2019-12-04 DIAGNOSIS — R791 Abnormal coagulation profile: Secondary | ICD-10-CM | POA: Diagnosis not present

## 2019-12-04 DIAGNOSIS — I8001 Phlebitis and thrombophlebitis of superficial vessels of right lower extremity: Secondary | ICD-10-CM | POA: Diagnosis not present

## 2019-12-04 DIAGNOSIS — C541 Malignant neoplasm of endometrium: Secondary | ICD-10-CM | POA: Diagnosis not present

## 2019-12-04 DIAGNOSIS — I2694 Multiple subsegmental pulmonary emboli without acute cor pulmonale: Secondary | ICD-10-CM | POA: Diagnosis not present

## 2019-12-04 DIAGNOSIS — Z7189 Other specified counseling: Secondary | ICD-10-CM | POA: Insufficient documentation

## 2019-12-04 DIAGNOSIS — R898 Other abnormal findings in specimens from other organs, systems and tissues: Secondary | ICD-10-CM | POA: Insufficient documentation

## 2019-12-04 LAB — PROTIME-INR
INR: 2.1 — ABNORMAL HIGH (ref 0.8–1.2)
Prothrombin Time: 22.4 seconds — ABNORMAL HIGH (ref 11.4–15.2)

## 2019-12-04 NOTE — Telephone Encounter (Signed)
Re:INR  Called patient's daughter regarding INR of 2.1.  Requested her mother take Coumadin 2 mg tonight.  She will have labs repeated tomorrow.   It is anticipated that her future dose may be 2 mg alternating with 1 mg a day.   Whitney Asal, MD

## 2019-12-05 ENCOUNTER — Telehealth: Payer: Self-pay | Admitting: Hematology and Oncology

## 2019-12-05 ENCOUNTER — Encounter: Payer: Self-pay | Admitting: Family Medicine

## 2019-12-05 ENCOUNTER — Inpatient Hospital Stay: Payer: PPO

## 2019-12-05 ENCOUNTER — Other Ambulatory Visit: Payer: Self-pay

## 2019-12-05 ENCOUNTER — Other Ambulatory Visit: Payer: Self-pay | Admitting: Hematology and Oncology

## 2019-12-05 DIAGNOSIS — C541 Malignant neoplasm of endometrium: Secondary | ICD-10-CM

## 2019-12-05 DIAGNOSIS — D75829 Heparin-induced thrombocytopenia, unspecified: Secondary | ICD-10-CM | POA: Insufficient documentation

## 2019-12-05 DIAGNOSIS — D696 Thrombocytopenia, unspecified: Secondary | ICD-10-CM

## 2019-12-05 DIAGNOSIS — I8001 Phlebitis and thrombophlebitis of superficial vessels of right lower extremity: Secondary | ICD-10-CM

## 2019-12-05 DIAGNOSIS — D7582 Heparin induced thrombocytopenia (HIT): Secondary | ICD-10-CM | POA: Insufficient documentation

## 2019-12-05 DIAGNOSIS — I2694 Multiple subsegmental pulmonary emboli without acute cor pulmonale: Secondary | ICD-10-CM

## 2019-12-05 LAB — PROTIME-INR
INR: 2.2 — ABNORMAL HIGH (ref 0.8–1.2)
Prothrombin Time: 23.4 seconds — ABNORMAL HIGH (ref 11.4–15.2)

## 2019-12-05 LAB — HEPARIN INDUCED THROMBOCYTOPENIA PNL
Heparin Induced Plt Ab: POSITIVE — CR
Patient O.D.: 2.064 OD UNITS
UFH High Dose UFH H: 0 % Release
UFH Low Dose 0.1 IU/mL: 95 % Release
UFH Low Dose 0.5 IU/mL: 82 % Release
UFH SRA Result: POSITIVE — CR

## 2019-12-05 NOTE — Telephone Encounter (Signed)
Re:  Labs  Called patient's daughter about INR.  INR was 2.2.  She is to take Coumadin 2 mg tonight and 1 mg tomorrow night.  She will be seen in the clinic on Wednesday, 12/07/2019.  We discussed her issues with pseudothrombocytopenia in the past.    We discussed her HIT assay from 11/29/2019.  HIT assay as well as SRA is +.    We discussed avoiding heparin in the future secondary to issues with thrombocytopenia and thrombosis.   Lequita Asal, MD

## 2019-12-06 NOTE — Progress Notes (Signed)
[facility]  [address] [place], [place] [address] Phone: [phone]  Fax: [phone]   Clinic Day:  12/07/2019  Referring physician: Leone Haven, MD  Chief Complaint: Whitney Simmons is a 86 y.o. female with grade I endometrial cancer and multiple subsegmental pulmonary embolism on Coumadin who is seen for 1 week assessment.  HPI:  The patient was last seen in the medical oncology clinic on 11/30/2019 for initial consultation. At that time, she was seen for initial assessment after a recent hospitalization for pulmonary embolism.  She was discharged on Coumadin 2.5 mg a day.  Follow-up INR 1 week after discharge was 7.8.  Her Coumadin was subsequently held.  Symptomatically, she had shortness of breath on exertion. She had been in the ER on 11/29/2019 with vaginal bleeding.  INR was 4.1.  Labs at last visit included a hemoglobin of 12.0 and platelets 259,000.  INR was 3.0.  Coumadin was held an additional day.  INR has been followed: 2.4 on 12/01/2019, 2.3 on 12/02/2019, 2.0 on 12/03/2019, 2.1 on 12/04/2019, and 2.2 on 12/05/2019.   She received Coumadin 1 mg on 12/01/2019, 1 mg on 12/02/2019, 2 mg on 12/03/2019, 2 mg on 12/04/2019, 2 mg on 12/05/2019, and 1 mg on 12/06/2019.  Additional labs from 11/29/2019 returned + heparin induced platelet antibody (OD 2.064) and + SRA (95% at 0.1 IU/ml and 82% at 0.5 IU/ml).  Test was performed when platelet count decreased to 97,000 on 11/25/2019 from 216,000 on 11/18/2019.  Platelet count was subsequently 198,000 on 11/29/2019 and 259,000 on 11/30/2019.  Notation made on the platelet count result of 11/25/2019 that " result may be falsely decreased due to platelet clumping".  Per patient, she has a history of pseudothrombocytopenia.  During the interim, she is 'not doing so good' today. She feels frustrated because it was difficult to find her vein to get her blood this morning. Her vaginal bleeding has  improved, it is not as heavy. She has an appointment with Dr. Caryl Bis on 12/14/2019. Her diet is stable. She denies any diarrhea. She agrees to begin avoiding cranberry juice while being on Coumadin to decrease the risk of affecting INR.    Past Medical History:  Diagnosis Date  . Breast microcalcification, mammographic 01/31/2014  . 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 Declined postoperative radiation and antiestrogen therapy.  . Edema   . Hyperlipidemia   . Hypertension   . Hypothyroidism   . IBS (irritable bowel syndrome)   . Irregular heart beat   . stage III kidney disease   . Thyroid disease    s/p radioactive iodine ablation 30 years ago    Past Surgical History:  Procedure Laterality Date  . BREAST LUMPECTOMY Left 2015   DUCTAL CARCINOMA IN SITU WITH CALCIFICATIONS  . BREAST SURGERY Left 02-15-14   wide excision  . CATARACT EXTRACTION    . CHOLECYSTECTOMY    . HYSTEROSCOPY WITH D & C N/A 10/18/2019   Procedure: DILATATION AND CURETTAGE /HYSTEROSCOPY;  Surgeon: Homero Fellers, MD;  Location: [facility] ORS;  Service: Gynecology;  Laterality: N/A;  . TOE AMPUTATION     left 5th toe    Family History  Problem Relation Age of Onset  . Heart disease Mother   . Heart disease Father   . Heart attack Father   . Heart disease Maternal Aunt   . Heart disease Brother   . AAA (abdominal aortic aneurysm)  Other   . Stroke Maternal Aunt   . Breast cancer Neg Hx     Social History:  reports that she has never smoked. She has never used smokeless tobacco. She reports that she does not drink alcohol or use drugs. The patient denies any exposure to radiation or toxins.  She is a retired Pharmacist, hospital.  The patient lives at [facility] [place].  Her husband is in a care facility in [facility].  Her daughter from [place] is currently in town. The patient is accompanied by her daughter today.   Allergies:  Allergies  Allergen Reactions  .  Levaquin [Levofloxacin In D5w] Other (See Comments)    hallucinations  . Sulfa Antibiotics     Hallucinations     Current Medications: Current Outpatient Medications  Medication Sig Dispense Refill  . aspirin EC 81 MG tablet Take 81 mg by mouth daily.    . furosemide (LASIX) 20 MG tablet Take 1 tablet (20 mg total) by mouth daily. 90 tablet 2  . latanoprost (XALATAN) 0.005 % ophthalmic solution Place 1 drop into both eyes at bedtime.     Marland Kitchen levothyroxine (EUTHYROX) 88 MCG tablet TAKE 1 TABLET BY MOUTH ONCE DAILY BEFORE BREAKFAST 90 tablet 3  . losartan (COZAAR) 25 MG tablet Take 1 tablet (25 mg total) by mouth daily. 30 tablet 1  . Multiple Vitamin (MULTIVITAMIN) tablet Take 1 tablet by mouth daily.    . nebivolol (BYSTOLIC) 5 MG tablet Take 1 tablet (5 mg total) by mouth daily. 90 tablet 2  . warfarin (COUMADIN) 1 MG tablet Take as directed by MD based on INR. 30 tablet 1   No current facility-administered medications for this visit.    Review of Systems  Constitutional: Negative for chills, diaphoresis, fever, malaise/fatigue and weight loss (up 9 lb).       She is "not doing so good today". Feels 'frustrated'.   HENT: Positive for hearing loss. Negative for congestion and sore throat.   Eyes: Negative for blurred vision and double vision.  Respiratory: Positive for shortness of breath (exertional). Negative for cough, hemoptysis, sputum production and wheezing.   Cardiovascular: Negative for chest pain, palpitations and leg swelling (right leg; improved).  Gastrointestinal: Negative for abdominal pain, blood in stool, constipation, diarrhea, heartburn, melena, nausea and vomiting.       Diet is stable. Drinks cranberry juice every morning to prevent UTI's.   Genitourinary: Negative for dysuria, frequency and urgency.       Vaginal bleeding; improving.   Musculoskeletal: Negative for back pain, falls, joint pain, myalgias and neck pain.  Skin: Negative for itching and rash.    Neurological: Negative for dizziness, sensory change, weakness and headaches.  Endo/Heme/Allergies: Negative for environmental allergies. Does not bruise/bleed easily.  Psychiatric/Behavioral: Negative for depression and memory loss. The patient is nervous/anxious (sometimes from stress). The patient does not have insomnia.        Easily stressed.    Performance status (ECOG): 1 - Symptomatic but completely ambulatory  Vitals Blood pressure (!) 132/46, pulse 72, temperature (!) 96.7 F (35.9 C), temperature source Tympanic, resp. rate 16, weight 177 lb 8 oz (80.5 kg), SpO2 100 %.   Physical Exam  Constitutional: She is oriented to person, place, and time. She appears well-developed and well-nourished. No distress. Face mask in place.  Elderly woman sitting comfortably in the exam room in no acute distress. She has a rolling walker by her side. She was examined in her chair.  HENT:  Head: Normocephalic and atraumatic.  Mouth/Throat: Oropharynx is clear and moist and mucous membranes are normal. No oral lesions.  Short gray hair.   Eyes: Pupils are equal, round, and reactive to light. Conjunctivae and EOM are normal. Right eye exhibits no discharge. Left eye exhibits no discharge. No scleral icterus.  Glasses.  Blue eyes.   Neck: No JVD present.  Cardiovascular: Normal rate, regular rhythm, normal heart sounds and intact distal pulses. Exam reveals no gallop and no friction rub.  No murmur heard. Pulmonary/Chest: Effort normal and breath sounds normal. No respiratory distress. She has no wheezes. She has no rhonchi. She has no rales. She exhibits no tenderness.  Abdominal: Soft. Normal appearance and bowel sounds are normal. She exhibits no distension and no mass. There is no hepatosplenomegaly. There is no abdominal tenderness. There is no rebound, no guarding and no CVA tenderness.  Musculoskeletal:        General: No tenderness or edema. Normal range of motion.     Cervical back:  Normal range of motion and neck supple.  Lymphadenopathy:       Head (right side): No preauricular, no posterior auricular and no occipital adenopathy present.       Head (left side): No preauricular, no posterior auricular and no occipital adenopathy present.    She has no cervical adenopathy.    She has no axillary adenopathy.       Right: No supraclavicular adenopathy present.       Left: No supraclavicular adenopathy present.  Neurological: She is alert and oriented to person, place, and time.  Skin: Skin is warm, dry and intact. No bruising, no lesion and no rash noted. She is not diaphoretic. No erythema. No pallor.  Psychiatric: She has a normal mood and affect. Her behavior is normal. Judgment and thought content normal.  Nursing note and vitals reviewed.   Appointment on 12/05/2019  Component Date Value Ref Range Status  . Prothrombin Time 12/05/2019 23.4* 11.4 - 15.2 seconds Final  . INR 12/05/2019 2.2* 0.8 - 1.2 Final   Comment: (NOTE) INR goal varies based on device and disease states. Performed at [facility], [address] [place], [place] [address]     Assessment:  RYANNA TESCHNER is a 86 y.o. female with localizedendometrial canceradmitted with right lower extremity thrombosis and bilateral pulmonary emboli.Chest, abdomen, and pelvis CT on 11/08/2019 revealed no evidence of metastatic disease.Mirena IUDwas placed on 11/09/2019.  Right lower extremity duplexon 05/15/2021revealed a nonocclusive thrombus/superficial thrombophlebitisof the greater saphenous vein below the level of the knee.  Chest CT angiogramon 11/13/2019 revealed a partially occlusive segmental and subsegmental bilateral pulmonary emboli within the right middle lobe, right lower lobe, left upper lobe and left lower lobes.There was no evidence of right ventricular heart strain.  Hypercoagulable work-up on 11/04/2019 was negative for the following: factor V Leiden, prothrombin  gene mutation, lupus anticoagulant panel, anticardiolipin antibodies and beta-2 glycoprotein antibodies.  She is currently on Coumadin.  She received Coumadin 5 mg on 05/17, 5 mg on 05/18, 5 mg on 05/19, 2.5 mg on 11/17/2019.  Coumadin was discontinued on 11/25/2019.  She received Coumadin 1 mg on 12/01/2019, 1 mg on 12/02/2019, 2 mg on 12/03/2019, 2 mg on 12/04/2019, 2 mg on 12/05/2019, and 1 mg on 12/06/2019.  INR has been followed: 1.1 on 11/13/2019, 1.4 on 11/16/2019, 2.1 on 11/17/2019, 2.7 on 11/18/2019, 7.8 on 11/25/2019, 4.1 on 11/29/2019, 3.0 on 11/30/2019, 2.4 on 12/01/2019, 2.3 on 12/02/2019, 2.0 on  12/03/2019, 2.1 on 12/04/2019, and 2.2 on 12/05/2019.  She has chronic renal insufficiency.Creatinine was 1.47(CrCl 27.4 ml/min) on 11/30/2019.  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  Platelet count had dropped from 216,000 on 11/18/2019 to 97,000 on 11/25/2019.  Platelet count was subsequently 198,000 on 11/29/2019 and 259,000 on 11/30/2019.  Notation made on the platelet count result of 11/25/2019 that " result may be falsely decreased due to platelet clumping).  Per patient, she has a history of pseudothrombocytopenia.  She has a distant history of pseudothrombocytopenia.  Platelet count on 11/25/2019 was noted to be "falsely decreased due to platelet clumping".   Symptomatically, vaginal bleeding has improved.  Exam is stable.  Plan: 1.   Labs today:  CBC with diff, platelet count in a blue top tube, PT/INR. 2.   Review labs from 11/30/2019 - 12/05/2019. 3.   Endometrial cancer She has localized disease. She declined surgery and opted for a Mirena IUD (placed 11/09/2019). Mirena IUD can be associated with thrombosis, but typically very little systemic absorption. Megace contraindicated (higher risk of thrombosis). Surgery  and radiation were previously discussed with patient.  Plan for IUD to remain in place.             Patient scheduled to see Dr. Theora Gianotti on 02/22/2020. 4. Bilateral pulmonary emboli and right superficial thrombophlebitis Patient acutely developed symptoms after IUD placement. Etiology of thrombosis likely multi-factorial: age, malignancy, decreased mobility. Hypercoagulable was negative.  Patient was on heparin then converted to Coumadin secondary to CrCl < 30. Coumadin was initially held on 11/25/2019 secondary to elevated INR (7.8).                         Patient denies any change in diet diet, diarrhea, antibiotics. Patient requires low-dose Coumadin to maintain a therapeutic INR.  She has been on low-dose Coumadin (1 mg alternating with 2 mg) with apparent stability and INR.Marland Kitchen 5.   MD or RN to call patient with today's labs and any adjustment in Coumadin. 6.   MD to call Dr Caryl Bis regarding management of Coumadin. 7.   RTC in 1 week in [place] for labs (PT/INR). 8.   RTC in 3 months for MD assessment and labs (CBC with diff, CMP, PT/INR).  I discussed the assessment and treatment plan with the patient.  The patient was provided an opportunity to ask questions and all were answered.  The patient agreed with the plan and demonstrated an understanding of the instructions.  The patient was advised to call back if the symptoms worsen or if the condition fails to improve as anticipated.   Lequita Asal, MD, PhD    12/07/2019, 9:31 AM  I, Heywood Footman, am acting as a Education administrator for Lequita Asal, MD.  I, [place] Mike Gip, MD, have reviewed the above documentation for accuracy and completeness, and I agree with the above.

## 2019-12-07 ENCOUNTER — Encounter: Payer: Self-pay | Admitting: Obstetrics and Gynecology

## 2019-12-07 ENCOUNTER — Encounter: Payer: Self-pay | Admitting: Hematology and Oncology

## 2019-12-07 ENCOUNTER — Other Ambulatory Visit: Payer: Self-pay

## 2019-12-07 ENCOUNTER — Telehealth: Payer: Self-pay | Admitting: Hematology and Oncology

## 2019-12-07 ENCOUNTER — Ambulatory Visit (INDEPENDENT_AMBULATORY_CARE_PROVIDER_SITE_OTHER): Payer: PPO | Admitting: Obstetrics and Gynecology

## 2019-12-07 ENCOUNTER — Telehealth: Payer: Self-pay | Admitting: Family Medicine

## 2019-12-07 ENCOUNTER — Inpatient Hospital Stay (HOSPITAL_BASED_OUTPATIENT_CLINIC_OR_DEPARTMENT_OTHER): Payer: PPO | Admitting: Hematology and Oncology

## 2019-12-07 ENCOUNTER — Inpatient Hospital Stay: Payer: PPO

## 2019-12-07 VITALS — BP 130/70 | Ht 67.0 in | Wt 177.2 lb

## 2019-12-07 VITALS — BP 132/46 | HR 72 | Temp 96.7°F | Resp 16 | Wt 177.5 lb

## 2019-12-07 DIAGNOSIS — C541 Malignant neoplasm of endometrium: Secondary | ICD-10-CM

## 2019-12-07 DIAGNOSIS — D75829 Heparin-induced thrombocytopenia, unspecified: Secondary | ICD-10-CM

## 2019-12-07 DIAGNOSIS — Z30431 Encounter for routine checking of intrauterine contraceptive device: Secondary | ICD-10-CM | POA: Diagnosis not present

## 2019-12-07 DIAGNOSIS — I2694 Multiple subsegmental pulmonary emboli without acute cor pulmonale: Secondary | ICD-10-CM

## 2019-12-07 DIAGNOSIS — Z7189 Other specified counseling: Secondary | ICD-10-CM | POA: Diagnosis not present

## 2019-12-07 DIAGNOSIS — D696 Thrombocytopenia, unspecified: Secondary | ICD-10-CM

## 2019-12-07 DIAGNOSIS — R300 Dysuria: Secondary | ICD-10-CM

## 2019-12-07 DIAGNOSIS — N950 Postmenopausal bleeding: Secondary | ICD-10-CM | POA: Diagnosis not present

## 2019-12-07 DIAGNOSIS — D7582 Heparin induced thrombocytopenia (HIT): Secondary | ICD-10-CM | POA: Diagnosis not present

## 2019-12-07 DIAGNOSIS — R898 Other abnormal findings in specimens from other organs, systems and tissues: Secondary | ICD-10-CM

## 2019-12-07 DIAGNOSIS — I8001 Phlebitis and thrombophlebitis of superficial vessels of right lower extremity: Secondary | ICD-10-CM

## 2019-12-07 DIAGNOSIS — Z7901 Long term (current) use of anticoagulants: Secondary | ICD-10-CM

## 2019-12-07 LAB — PROTIME-INR
INR: 2.7 — ABNORMAL HIGH (ref 0.8–1.2)
Prothrombin Time: 28 seconds — ABNORMAL HIGH (ref 11.4–15.2)

## 2019-12-07 LAB — PLATELET BY CITRATE: Platelet CT in Citrate: 198

## 2019-12-07 NOTE — Progress Notes (Signed)
History of Present Illness:  Whitney Simmons is a 86 y.o. that had a Mirena IUD placed approximately 1 month ago. Since that time, she states that she has had some bleeding, generally light and does not saturate a bad. Some pelvic pressure and dysuria.   PMHx: She  has a past medical history of Breast microcalcification, mammographic (01/31/2014), Cancer ([place]) (02-15-14), Edema, Hyperlipidemia, Hypertension, Hypothyroidism, IBS (irritable bowel syndrome), Irregular heart beat, stage III kidney disease, and Thyroid disease. Also,  has a past surgical history that includes Cholecystectomy; Cataract extraction; Toe amputation; Breast surgery (Left, 02-15-14); Breast lumpectomy (Left, 2015); and Hysteroscopy with D & C (N/A, 10/18/2019)., family history includes AAA (abdominal aortic aneurysm) in an other family member; Heart attack in her father; Heart disease in her brother, father, maternal aunt, and mother; Stroke in her maternal aunt.,  reports that she has never smoked. She has never used smokeless tobacco. She reports that she does not drink alcohol or use drugs.   Current Meds  Medication Sig  . aspirin EC 81 MG tablet Take 81 mg by mouth daily.  . furosemide (LASIX) 20 MG tablet Take 1 tablet (20 mg total) by mouth daily.  Marland Kitchen latanoprost (XALATAN) 0.005 % ophthalmic solution Place 1 drop into both eyes at bedtime.   Marland Kitchen levothyroxine (EUTHYROX) 88 MCG tablet TAKE 1 TABLET BY MOUTH ONCE DAILY BEFORE BREAKFAST  . losartan (COZAAR) 25 MG tablet Take 1 tablet (25 mg total) by mouth daily.  . Multiple Vitamin (MULTIVITAMIN) tablet Take 1 tablet by mouth daily.  . nebivolol (BYSTOLIC) 5 MG tablet Take 1 tablet (5 mg total) by mouth daily.  Marland Kitchen warfarin (COUMADIN) 1 MG tablet Take as directed by MD based on INR.  Marland Kitchen  Also, is allergic to levaquin [levofloxacin in d5w] and sulfa antibiotics..  Review of Systems  Constitutional: Negative for chills, fever, malaise/fatigue and weight loss.  HENT: Negative for  congestion, hearing loss and sinus pain.   Eyes: Negative for blurred vision and double vision.  Respiratory: Negative for cough, sputum production, shortness of breath and wheezing.   Cardiovascular: Negative for chest pain, palpitations, orthopnea and leg swelling.  Gastrointestinal: Negative for abdominal pain, constipation, diarrhea, nausea and vomiting.  Genitourinary: Negative for dysuria, flank pain, frequency, hematuria and urgency.  Musculoskeletal: Negative for back pain, falls and joint pain.  Skin: Negative for itching and rash.  Neurological: Negative for dizziness and headaches.  Psychiatric/Behavioral: Negative for depression, substance abuse and suicidal ideas. The patient is not nervous/anxious.     Physical Exam:  BP 130/70   Ht 5\' 7"  (1.702 m)   Wt 177 lb 3.2 oz (80.4 kg)   BMI 27.75 kg/m  Body mass index is 27.75 kg/m. Constitutional: Well nourished, well developed female in no acute distress.  Abdomen: diffusely non tender to palpation, non distended, and no masses, hernias Neuro: Grossly intact Psych:  Normal mood and affect.    Pelvic exam:  Two IUD strings present seen coming from the cervical os. EGBUS, vaginal vault and cervix: within normal limits Scant bloody discharge on exam.   Assessment: IUD strings present in proper location; pt doing well  Plan: She was told to continue to use barrier contraception, in order to prevent any STIs, and to take a home pregnancy test or call us if she ever thinks she may be pregnant, and that her IUD expires in 6 years.  Will sent urine culture for dysuria.  She was amenable to this plan and we will  see her back in 3 months.  A total of 20 minutes were spent face-to-face with the patient as well as preparation, review, communication, and documentation during this encounter.   Adrian Prows MD, [facility], [facility] 12/07/2019 4:04 PM

## 2019-12-07 NOTE — Telephone Encounter (Signed)
Received call from Dr Mike Gip regarding this patient.  She noted the patient's INR came back at 2.7 today.  The patient has had a hard time getting stuck at the cancer center and they wanted to check and see if we can take over her INR checks and Coumadin dosing.  Dr. Mike Gip feels that the patient will likely end up on 1 mg alternating with 2 mg and she may have to do 2 days of 1 mg followed by 1 day of 2 mg to keep her INR within an acceptable range.  She recommended having her INR rechecked on 6/11 and then if it is okay would recheck next week when the patient follows up with me.  I advised that this was fine.  I will have my medical assistant contact the patient to get her scheduled for an INR check on Friday.

## 2019-12-07 NOTE — Telephone Encounter (Signed)
Patient was called and  is scheduled for a INR check on 12/09/2019 per the provider.  Stevey Stapleton,cma

## 2019-12-07 NOTE — Progress Notes (Signed)
Patient is wanting to know if she should still take her aspirin and if she can drink cranberry juice. She states that her cramping and bleeding has improved since last week. She is on 1mg  of coumadin. Her daughter is with her today.

## 2019-12-07 NOTE — Telephone Encounter (Signed)
Re:  Labs and follow-up with Dr Varney Biles patient's daughter about INR.  INR was 2.7.  She is to take Coumadin 1 mg tonight and 1 mg tomorrow night.  She will be seen in Dr Ellen Henri clinic on Friday, 12/09/2019, for PT/INR.  He will be taking over her Coumadin.  He will also see her on Wednesday, 12/14/2019, for follow-up and labs.  I reminded her not to drink cranberry juice as it can increase the INR.   Lequita Asal, MD

## 2019-12-09 ENCOUNTER — Other Ambulatory Visit: Payer: Self-pay

## 2019-12-09 ENCOUNTER — Telehealth: Payer: Self-pay

## 2019-12-09 ENCOUNTER — Other Ambulatory Visit (INDEPENDENT_AMBULATORY_CARE_PROVIDER_SITE_OTHER): Payer: PPO

## 2019-12-09 DIAGNOSIS — Z7901 Long term (current) use of anticoagulants: Secondary | ICD-10-CM

## 2019-12-09 LAB — URINE CULTURE

## 2019-12-09 LAB — PROTIME-INR
INR: 2.3 ratio — ABNORMAL HIGH (ref 0.8–1.0)
Prothrombin Time: 26 s — ABNORMAL HIGH (ref 9.6–13.1)

## 2019-12-09 NOTE — Telephone Encounter (Signed)
Patient daughter already notified by Viviana Simpler (see result note)

## 2019-12-09 NOTE — Telephone Encounter (Signed)
Generally just suggestive of contamination. Does not need antibiotics

## 2019-12-09 NOTE — Telephone Encounter (Signed)
Whitney Simmons reports they have seen the culture results (=bacteria) on my chart. Inquiring if Dr.Schuman has a course of action for this. AL#[phone]

## 2019-12-09 NOTE — Progress Notes (Signed)
Please let patient know about normal urine culture result. Thank you

## 2019-12-12 NOTE — Telephone Encounter (Signed)
Pt was seen on 11/30/2019 and had a follow up appt on 12/07/2019.

## 2019-12-14 ENCOUNTER — Ambulatory Visit (INDEPENDENT_AMBULATORY_CARE_PROVIDER_SITE_OTHER): Payer: PPO | Admitting: Family Medicine

## 2019-12-14 ENCOUNTER — Inpatient Hospital Stay: Payer: PPO

## 2019-12-14 ENCOUNTER — Encounter: Payer: Self-pay | Admitting: Family Medicine

## 2019-12-14 ENCOUNTER — Other Ambulatory Visit: Payer: Self-pay

## 2019-12-14 DIAGNOSIS — E039 Hypothyroidism, unspecified: Secondary | ICD-10-CM | POA: Diagnosis not present

## 2019-12-14 DIAGNOSIS — I10 Essential (primary) hypertension: Secondary | ICD-10-CM | POA: Diagnosis not present

## 2019-12-14 DIAGNOSIS — I2694 Multiple subsegmental pulmonary emboli without acute cor pulmonale: Secondary | ICD-10-CM | POA: Diagnosis not present

## 2019-12-14 DIAGNOSIS — N179 Acute kidney failure, unspecified: Secondary | ICD-10-CM

## 2019-12-14 DIAGNOSIS — N1832 Chronic kidney disease, stage 3b: Secondary | ICD-10-CM | POA: Insufficient documentation

## 2019-12-14 LAB — BASIC METABOLIC PANEL
BUN: 22 mg/dL (ref 6–23)
CO2: 23 mEq/L (ref 19–32)
Calcium: 8.4 mg/dL (ref 8.4–10.5)
Chloride: 102 mEq/L (ref 96–112)
Creatinine, Ser: 1.31 mg/dL — ABNORMAL HIGH (ref 0.40–1.20)
GFR: 38.14 mL/min — ABNORMAL LOW (ref >60.00)
Glucose, Bld: 142 mg/dL — ABNORMAL HIGH (ref 70–99)
Potassium: 4.3 mEq/L (ref 3.5–5.1)
Sodium: 134 mEq/L — ABNORMAL LOW (ref 135–145)

## 2019-12-14 LAB — PROTIME-INR
INR: 2.8 ratio — ABNORMAL HIGH (ref 0.8–1.0)
Prothrombin Time: 31 s — ABNORMAL HIGH (ref 9.6–13.1)

## 2019-12-14 NOTE — Assessment & Plan Note (Signed)
Symptomatically stable.  Discussed that it would take quite some time for her to regain her stamina and breathing.  She will remain on Coumadin.  We will check an INR today to determine next step in Coumadin management.

## 2019-12-14 NOTE — Patient Instructions (Signed)
Nice to see you. We will check your INR today.  We will contact you with the results.  You will take 1 mg of Coumadin today.  We will get you scheduled for 1 week lab check.

## 2019-12-14 NOTE — Assessment & Plan Note (Signed)
Adequate control.  Continue Synthroid.

## 2019-12-14 NOTE — Progress Notes (Signed)
  Tommi Rumps, MD Phone: [phone]  Whitney Simmons is a 86 y.o. female who presents today for f/u.  Pulmonary embolism: Patient notes her breathing is stable.  She does get short of breath with exertion.  No chest pain.  No swelling.  She has been on Coumadin 2 mg last Friday with 1 mg on Saturday and Sunday followed by 2 mg on Monday and 1 mg yesterday and today.  She has had no bleeding issues since having her dose decreased and her INR returned to an acceptable range.  Hypertension: Not checking at home.  Taking losartan and Bystolic.  Hypothyroidism: Taking Synthroid.  No skin changes.  No heat intolerance.  Some cold intolerance since going on Coumadin.  Social History   Tobacco Use  Smoking Status Never Smoker  Smokeless Tobacco Never Used     ROS see history of present illness  Objective  Physical Exam Vitals:   12/14/19 0953  BP: 110/60  Pulse: (!) 56  Temp: (!) 96.7 F (35.9 C)  SpO2: 98%    BP Readings from Last 3 Encounters:  12/14/19 110/60  12/07/19 130/70  12/07/19 (!) 132/46   Wt Readings from Last 3 Encounters:  12/14/19 178 lb 3.2 oz (80.8 kg)  12/07/19 177 lb 3.2 oz (80.4 kg)  12/07/19 177 lb 8 oz (80.5 kg)    Physical Exam Constitutional:      General: She is not in acute distress.    Appearance: She is not diaphoretic.  Cardiovascular:     Rate and Rhythm: Normal rate and regular rhythm.     Heart sounds: Normal heart sounds.  Pulmonary:     Effort: Pulmonary effort is normal.     Breath sounds: Normal breath sounds.  Musculoskeletal:     Right lower leg: No edema.     Left lower leg: No edema.  Skin:    General: Skin is warm and dry.  Neurological:     Mental Status: She is alert.      Assessment/Plan: Please see individual problem list.  Pulmonary embolism ([facility]) Symptomatically stable.  Discussed that it would take quite some time for her to regain her stamina and breathing.  She will remain on Coumadin.  We will check  an INR today to determine next step in Coumadin management.  Hypertension At goal.  Continue current regimen.  Hypothyroidism Adequate control.  Continue Synthroid.  AKI (acute kidney injury) ([place]) Kidney function worsened recently.  We will recheck today.   Orders Placed This Encounter  Procedures  . Basic Metabolic Panel (BMET)  . Protime-INR    No orders of the defined types were placed in this encounter.   This visit occurred during the SARS-CoV-2 public health emergency.  Safety protocols were in place, including screening questions prior to the visit, additional usage of staff PPE, and extensive cleaning of exam room while observing appropriate contact time as indicated for disinfecting solutions.    Tommi Rumps, MD [place]

## 2019-12-14 NOTE — Assessment & Plan Note (Signed)
Kidney function worsened recently.  We will recheck today.

## 2019-12-14 NOTE — Assessment & Plan Note (Signed)
At goal. Continue current regimen. 

## 2019-12-16 ENCOUNTER — Other Ambulatory Visit: Payer: Self-pay | Admitting: Hematology and Oncology

## 2019-12-16 DIAGNOSIS — I2694 Multiple subsegmental pulmonary emboli without acute cor pulmonale: Secondary | ICD-10-CM

## 2019-12-16 NOTE — Telephone Encounter (Signed)
Protime-INR Order: [id number] Status:  Final result  Visible to patient:  Yes (seen)  Next appt:  12/21/2019 at 10:15 AM in Family Medicine ([facility])  Dx:  Multiple subsegmental pulmonary embol...  2 Result Notes  Ref Range & Units 2 d ago  INR 0.8 - 1.0 ratio 2.8High   Prothrombin Time 9.6 - 13.1 sec 31.0High   Resulting Agency  [facility]      Specimen Collected: 12/14/19 10:16  Last Resulted: 12/14/19 15:04

## 2019-12-21 ENCOUNTER — Other Ambulatory Visit: Payer: Self-pay

## 2019-12-21 ENCOUNTER — Other Ambulatory Visit (INDEPENDENT_AMBULATORY_CARE_PROVIDER_SITE_OTHER): Payer: PPO

## 2019-12-21 DIAGNOSIS — Z7901 Long term (current) use of anticoagulants: Secondary | ICD-10-CM

## 2019-12-21 LAB — PROTIME-INR
INR: 2.8 ratio — ABNORMAL HIGH (ref 0.8–1.0)
Prothrombin Time: 31.1 s — ABNORMAL HIGH (ref 9.6–13.1)

## 2019-12-22 ENCOUNTER — Ambulatory Visit: Payer: PPO

## 2019-12-28 ENCOUNTER — Other Ambulatory Visit: Payer: Self-pay

## 2019-12-28 ENCOUNTER — Other Ambulatory Visit (INDEPENDENT_AMBULATORY_CARE_PROVIDER_SITE_OTHER): Payer: PPO

## 2019-12-28 DIAGNOSIS — Z7901 Long term (current) use of anticoagulants: Secondary | ICD-10-CM | POA: Diagnosis not present

## 2019-12-28 LAB — PROTIME-INR
INR: 2.9 ratio — ABNORMAL HIGH (ref 0.8–1.0)
Prothrombin Time: 32.4 s — ABNORMAL HIGH (ref 9.6–13.1)

## 2019-12-30 ENCOUNTER — Telehealth: Payer: Self-pay | Admitting: Family Medicine

## 2019-12-30 NOTE — Telephone Encounter (Signed)
Please see if the patient can come in for an INR Wednesday of this coming week. I would like to check her INR weekly for a little while longer. Thanks.

## 2019-12-30 NOTE — Telephone Encounter (Signed)
I called and scheduled the patient for PT check next week.  Whitney Simmons,cma

## 2020-01-04 ENCOUNTER — Other Ambulatory Visit (INDEPENDENT_AMBULATORY_CARE_PROVIDER_SITE_OTHER): Payer: PPO

## 2020-01-04 ENCOUNTER — Other Ambulatory Visit: Payer: Self-pay

## 2020-01-04 DIAGNOSIS — Z7901 Long term (current) use of anticoagulants: Secondary | ICD-10-CM | POA: Diagnosis not present

## 2020-01-04 LAB — PROTIME-INR
INR: 3.3 ratio — ABNORMAL HIGH (ref 0.8–1.0)
Prothrombin Time: 36.3 s — ABNORMAL HIGH (ref 9.6–13.1)

## 2020-01-05 ENCOUNTER — Telehealth: Payer: Self-pay | Admitting: Family Medicine

## 2020-01-05 NOTE — Telephone Encounter (Signed)
Pt's daughter states that when pt was in the hospital an MD pu tin rx for losartan (COZAAR) 25 MG tablet. She states that Dr Caryl Bis had her on 50mg . Pt is unsure of what she is supposed to take. States that she has a bottle of 50mg  and wants to know if she should take that instead? Please advise

## 2020-01-05 NOTE — Telephone Encounter (Signed)
I called and informed the patient that she is to take the 25 mg of losartan and she understood.  Chistina Roston,cma

## 2020-01-06 NOTE — Telephone Encounter (Signed)
Noted. She should be on losartan 25 mg once daily.

## 2020-01-11 ENCOUNTER — Other Ambulatory Visit: Payer: Self-pay

## 2020-01-11 ENCOUNTER — Other Ambulatory Visit (INDEPENDENT_AMBULATORY_CARE_PROVIDER_SITE_OTHER): Payer: PPO

## 2020-01-11 DIAGNOSIS — Z7901 Long term (current) use of anticoagulants: Secondary | ICD-10-CM | POA: Diagnosis not present

## 2020-01-11 LAB — PROTIME-INR
INR: 2.4 ratio — ABNORMAL HIGH (ref 0.8–1.0)
Prothrombin Time: 26.6 s — ABNORMAL HIGH (ref 9.6–13.1)

## 2020-01-12 ENCOUNTER — Other Ambulatory Visit: Payer: Self-pay | Admitting: *Deleted

## 2020-01-12 DIAGNOSIS — R791 Abnormal coagulation profile: Secondary | ICD-10-CM

## 2020-01-12 DIAGNOSIS — Z7901 Long term (current) use of anticoagulants: Secondary | ICD-10-CM

## 2020-01-18 ENCOUNTER — Other Ambulatory Visit: Payer: Self-pay

## 2020-01-18 ENCOUNTER — Other Ambulatory Visit (INDEPENDENT_AMBULATORY_CARE_PROVIDER_SITE_OTHER): Payer: PPO

## 2020-01-18 DIAGNOSIS — Z7901 Long term (current) use of anticoagulants: Secondary | ICD-10-CM

## 2020-01-18 LAB — PROTIME-INR
INR: 2.8 ratio — ABNORMAL HIGH (ref 0.8–1.0)
Prothrombin Time: 31 s — ABNORMAL HIGH (ref 9.6–13.1)

## 2020-02-01 ENCOUNTER — Other Ambulatory Visit (INDEPENDENT_AMBULATORY_CARE_PROVIDER_SITE_OTHER): Payer: PPO

## 2020-02-01 ENCOUNTER — Other Ambulatory Visit: Payer: Self-pay

## 2020-02-01 DIAGNOSIS — Z7901 Long term (current) use of anticoagulants: Secondary | ICD-10-CM

## 2020-02-01 LAB — PROTIME-INR
INR: 2.6 ratio — ABNORMAL HIGH (ref 0.8–1.0)
Prothrombin Time: 28.3 s — ABNORMAL HIGH (ref 9.6–13.1)

## 2020-02-13 ENCOUNTER — Telehealth: Payer: Self-pay | Admitting: Family Medicine

## 2020-02-13 NOTE — Telephone Encounter (Signed)
Shontel from [employer] called again about pt medical clearence

## 2020-02-13 NOTE — Telephone Encounter (Signed)
Shontel from [facility] called and needs medical clearance for pt;s routine cleaning and exam. Pt's appt is tomorrow morning on 02/14/20. Please call back asap at [phone]

## 2020-02-13 NOTE — Telephone Encounter (Signed)
Patients appt is tomorrow morning @ 11 am and she needs a medical clearance, asap. I just received the form at 4:30 pm and put it in your sign basket if you sign first thing I will fax back to dentist office.  Loukas Antonson,cma

## 2020-02-14 NOTE — Telephone Encounter (Signed)
Filled out and signed

## 2020-02-14 NOTE — Telephone Encounter (Signed)
Faxed to dentist office. Confirmation given.  Gae Bon.,cma

## 2020-02-15 ENCOUNTER — Other Ambulatory Visit: Payer: Self-pay

## 2020-02-15 ENCOUNTER — Other Ambulatory Visit (INDEPENDENT_AMBULATORY_CARE_PROVIDER_SITE_OTHER): Payer: PPO

## 2020-02-15 ENCOUNTER — Telehealth: Payer: Self-pay | Admitting: Obstetrics and Gynecology

## 2020-02-15 DIAGNOSIS — Z7901 Long term (current) use of anticoagulants: Secondary | ICD-10-CM | POA: Diagnosis not present

## 2020-02-15 LAB — PROTIME-INR
INR: 1.8 ratio — ABNORMAL HIGH (ref 0.8–1.0)
Prothrombin Time: 20.5 s — ABNORMAL HIGH (ref 9.6–13.1)

## 2020-02-15 NOTE — Telephone Encounter (Signed)
Writer was asked on this date to phone patient and move appt on 02-22-20 to 03-07-20 as provider was going to be in surgery on 02-22-20. Writer phoned patient and informed of the above. Appt moved to 03-07-20 and patient is aware of appt change.

## 2020-02-16 ENCOUNTER — Telehealth: Payer: Self-pay

## 2020-02-16 NOTE — Telephone Encounter (Signed)
Patient is reschedule to 03/09/20 with [facility]

## 2020-02-16 NOTE — Telephone Encounter (Signed)
Please contact [name], pt dgtr to r/s 03/01/20 apt w/[facility] until after 03/07/20.

## 2020-02-16 NOTE — Telephone Encounter (Signed)
Whitney Simmons, dgtr, reports patient was to see [facility] 03/01/20 (1 wk following her apt w/[facility] for bx), but Theora Gianotti has r/s his apt to 03/07/20. Inquiring if they need to r/s her apt w/[facility] til after the apt w/[facility]?

## 2020-02-16 NOTE — Telephone Encounter (Signed)
Yes

## 2020-02-22 ENCOUNTER — Inpatient Hospital Stay: Payer: PPO

## 2020-02-22 ENCOUNTER — Telehealth: Payer: Self-pay

## 2020-02-22 NOTE — Telephone Encounter (Signed)
Appointment has been scheduled tomorrow.

## 2020-02-22 NOTE — Telephone Encounter (Signed)
Patient would like another INR test before she sees PCP on Friday. Please advise and place order.

## 2020-02-22 NOTE — Telephone Encounter (Signed)
Left message informing patient to return call back to schedule lab appointment for tomorrow for INR.

## 2020-02-22 NOTE — Telephone Encounter (Signed)
It is okay to schedule her for this.  She should have standing orders for an INR.

## 2020-02-23 ENCOUNTER — Other Ambulatory Visit: Payer: Self-pay

## 2020-02-23 ENCOUNTER — Telehealth: Payer: Self-pay

## 2020-02-23 ENCOUNTER — Other Ambulatory Visit (INDEPENDENT_AMBULATORY_CARE_PROVIDER_SITE_OTHER): Payer: PPO

## 2020-02-23 DIAGNOSIS — Z7901 Long term (current) use of anticoagulants: Secondary | ICD-10-CM | POA: Diagnosis not present

## 2020-02-23 LAB — PROTIME-INR
INR: 2 ratio — ABNORMAL HIGH (ref 0.8–1.0)
Prothrombin Time: 22.6 s — ABNORMAL HIGH (ref 9.6–13.1)

## 2020-02-23 NOTE — Telephone Encounter (Signed)
Called and spoke with daughter, Whitney Simmons. Per Dr Theora Gianotti, [name] does not need to hold her Coumadin prior to her biopsy on 9/8.

## 2020-02-24 ENCOUNTER — Encounter: Payer: Self-pay | Admitting: Family Medicine

## 2020-02-24 ENCOUNTER — Ambulatory Visit (INDEPENDENT_AMBULATORY_CARE_PROVIDER_SITE_OTHER): Payer: PPO | Admitting: Family Medicine

## 2020-02-24 ENCOUNTER — Telehealth: Payer: Self-pay | Admitting: Family Medicine

## 2020-02-24 ENCOUNTER — Other Ambulatory Visit: Payer: Self-pay

## 2020-02-24 DIAGNOSIS — I2694 Multiple subsegmental pulmonary emboli without acute cor pulmonale: Secondary | ICD-10-CM | POA: Diagnosis not present

## 2020-02-24 DIAGNOSIS — I10 Essential (primary) hypertension: Secondary | ICD-10-CM | POA: Diagnosis not present

## 2020-02-24 NOTE — Assessment & Plan Note (Signed)
Recent INR at goal.  Continue Coumadin.  Breathing issues and bilateral leg swelling seems to be related to fluid accumulation as it improves with Lasix.  She will monitor for any sudden worsening of her breathing or any unilateral swelling or chest pain.

## 2020-02-24 NOTE — Telephone Encounter (Signed)
I called and spoke with the patient and informed Whitney Simmons that Dr. Mike Gip would like to continue to see Whitney Simmons so she needed to keep the appointment on 03/08/20 with him and she understood.  Srinivas Lippman,cma

## 2020-02-24 NOTE — Patient Instructions (Addendum)
Nice to see you. Please continue your current medication regimen. If you develop worsening breathing issues, chest pain, swelling of one leg greater than the other, or new symptoms please seek medical attention.

## 2020-02-24 NOTE — Progress Notes (Signed)
  Tommi Rumps, MD Phone: [phone]  Whitney Simmons is a 86 y.o. female who presents today for f/u.  PE: Patient continues on Coumadin.  Recent INR 2.0.  Occasional edema that resolves with Lasix.  Occasional shortness of breath that resolves with Lasix and notes this is stable.  Occasionally she will bump her Lasix dose up to 40 mg if she feels as though she is retaining fluid.  Notes her breathing overall is significantly better than when she was in the hospital.  No chest pain.  She follows up with hematology next week.  Hypertension: Not checking at home.  Taking losartan, Lasix, and Bystolic.  No chest pain.  Social History   Tobacco Use  Smoking Status Never Smoker  Smokeless Tobacco Never Used     ROS see history of present illness  Objective  Physical Exam Vitals:   02/24/20 1103  BP: 115/80  Pulse: 75  Temp: 98.4 F (36.9 C)  SpO2: 96%    BP Readings from Last 3 Encounters:  02/24/20 115/80  12/14/19 110/60  12/07/19 130/70   Wt Readings from Last 3 Encounters:  02/24/20 177 lb 6.4 oz (80.5 kg)  12/14/19 178 lb 3.2 oz (80.8 kg)  12/07/19 177 lb 3.2 oz (80.4 kg)    Physical Exam Constitutional:      General: She is not in acute distress.    Appearance: She is not diaphoretic.  Cardiovascular:     Rate and Rhythm: Normal rate and regular rhythm.     Heart sounds: Normal heart sounds.  Pulmonary:     Effort: Pulmonary effort is normal.     Breath sounds: Normal breath sounds.  Musculoskeletal:     Right lower leg: No edema.     Left lower leg: No edema.  Skin:    General: Skin is warm and dry.  Neurological:     Mental Status: She is alert.      Assessment/Plan: Please see individual problem list.  Hypertension Well-controlled.  Occasional dyspnea that seems to be related to fluid accumulation as it improves with Lasix.  Possibly has some measure of diastolic dysfunction as her last echo revealed normal systolic function.  She can  continue Lasix and her other medications.  She will monitor for any worsening breathing issues.  Pulmonary embolism (HCC) Recent INR at goal.  Continue Coumadin.  Breathing issues and bilateral leg swelling seems to be related to fluid accumulation as it improves with Lasix.  She will monitor for any sudden worsening of her breathing or any unilateral swelling or chest pain.  Patient wondered if she needed to follow-up with hematology as it is difficult for her to get [place] to see them.  Discussed my preference would be for her to see the hematologist again though I did send a message to the hematologist to check for the patient at her request.  No orders of the defined types were placed in this encounter.   No orders of the defined types were placed in this encounter.   This visit occurred during the SARS-CoV-2 public health emergency.  Safety protocols were in place, including screening questions prior to the visit, additional usage of staff PPE, and extensive cleaning of exam room while observing appropriate contact time as indicated for disinfecting solutions.    Tommi Rumps, MD [place]

## 2020-02-24 NOTE — Telephone Encounter (Signed)
Please let the patient know that Dr. Mike Gip would like to continue to see her.  She should keep her appointment with her as scheduled.

## 2020-02-24 NOTE — Telephone Encounter (Signed)
-----   Message from Lequita Asal, MD sent at 02/24/2020  3:44 PM EDT -----  I am glad she is doing well.  Her INR appears stable.  I would like to continue to see her given her endometrial cancer and PE.  I can work with Dr Theora Gianotti so that she and I can alternate visits.  I can discuss further with her when I see her on 03/08/2020 if that is ok with her.  Melissa  ----- Message ----- From: Leone Haven, MD Sent: 02/24/2020  11:22 AM EDT To: Lequita Asal, MD  Hi Dr Mike Gip,   I saw Mrs Mestre today. She seems to be doing well. She has follow-up with you on 03/08/20. She noted it is difficult for her to come to [facility] for her visits and she wondered if she needed to follow-up with you as I have been monitoring her INR. I advised her my preference would be that she should complete this follow-up with you though she wanted me to check with you as well to make sure. Thanks for your help.  Randall Hiss

## 2020-02-24 NOTE — Assessment & Plan Note (Signed)
Well-controlled.  Occasional dyspnea that seems to be related to fluid accumulation as it improves with Lasix.  Possibly has some measure of diastolic dysfunction as her last echo revealed normal systolic function.  She can continue Lasix and her other medications.  She will monitor for any worsening breathing issues.

## 2020-02-27 ENCOUNTER — Other Ambulatory Visit: Payer: Self-pay

## 2020-02-27 ENCOUNTER — Ambulatory Visit: Payer: PPO | Admitting: Podiatry

## 2020-02-27 ENCOUNTER — Encounter: Payer: Self-pay | Admitting: Podiatry

## 2020-02-27 DIAGNOSIS — B351 Tinea unguium: Secondary | ICD-10-CM | POA: Diagnosis not present

## 2020-02-27 DIAGNOSIS — D696 Thrombocytopenia, unspecified: Secondary | ICD-10-CM

## 2020-02-27 DIAGNOSIS — M79676 Pain in unspecified toe(s): Secondary | ICD-10-CM

## 2020-02-27 DIAGNOSIS — I82812 Embolism and thrombosis of superficial veins of left lower extremities: Secondary | ICD-10-CM

## 2020-02-27 DIAGNOSIS — N1831 Chronic kidney disease, stage 3a: Secondary | ICD-10-CM

## 2020-02-27 DIAGNOSIS — N179 Acute kidney failure, unspecified: Secondary | ICD-10-CM

## 2020-02-27 NOTE — Progress Notes (Signed)
This patient returns to my office for at risk foot care.  This patient requires this care by a professional since this patient will be at risk due to having history of thrombi and chronic kidney disease. Patient is accompanied to the office with her daughter.  This patient is unable to cut nails herself since the patient cannot reach her nails.These nails are painful walking and wearing shoes.  This patient presents for at risk foot care today.  General Appearance  Alert, conversant and in no acute stress.  Vascular  Dorsalis pedis and posterior tibial  pulses are  Weakly palpable  bilaterally.  Capillary return is within normal limits  bilaterally. Temperature is within normal limits  bilaterally.  Neurologic  Senn-Weinstein monofilament wire test within normal limits  bilaterally. Muscle power within normal limits bilaterally.  Nails Thick disfigured discolored nails with subungual debris  from hallux to fifth toes bilaterally. No evidence of bacterial infection or drainage bilaterally.  Orthopedic  No limitations of motion  feet .  No crepitus or effusions noted.  No bony pathology or digital deformities noted. Amputation fifth toe left.  Contracted fifth toe right foot.  Skin  normotropic skin with no porokeratosis noted bilaterally.  No signs of infections or ulcers noted.     Onychomycosis  Pain in right toes  Pain in left toes  Consent was obtained for treatment procedures.   Mechanical debridement of nails 1-5  bilaterally performed with a nail nipper.  Filed with dremel without incident.    Return office visit   3 months                  Told patient to return for periodic foot care and evaluation due to potential at risk complications.   Gardiner Barefoot DPM

## 2020-02-29 ENCOUNTER — Other Ambulatory Visit: Payer: Self-pay

## 2020-02-29 ENCOUNTER — Other Ambulatory Visit (INDEPENDENT_AMBULATORY_CARE_PROVIDER_SITE_OTHER): Payer: PPO

## 2020-02-29 DIAGNOSIS — Z7901 Long term (current) use of anticoagulants: Secondary | ICD-10-CM

## 2020-02-29 LAB — PROTIME-INR
INR: 2.2 ratio — ABNORMAL HIGH (ref 0.8–1.0)
Prothrombin Time: 24.5 s — ABNORMAL HIGH (ref 9.6–13.1)

## 2020-03-01 ENCOUNTER — Telehealth: Payer: Self-pay

## 2020-03-01 ENCOUNTER — Ambulatory Visit: Payer: PPO | Admitting: Obstetrics and Gynecology

## 2020-03-01 NOTE — Telephone Encounter (Signed)
-----   Message from Leone Haven, MD sent at 03/01/2020  1:39 PM EDT ----- Please let the patient know her INR is acceptable.  She should continue with her current Coumadin regimen and we will recheck in 1 week.  Thanks.

## 2020-03-05 ENCOUNTER — Other Ambulatory Visit: Payer: Self-pay | Admitting: Hematology and Oncology

## 2020-03-05 DIAGNOSIS — I2694 Multiple subsegmental pulmonary emboli without acute cor pulmonale: Secondary | ICD-10-CM

## 2020-03-07 ENCOUNTER — Other Ambulatory Visit: Payer: Self-pay

## 2020-03-07 ENCOUNTER — Inpatient Hospital Stay: Payer: PPO | Attending: Obstetrics and Gynecology | Admitting: Obstetrics and Gynecology

## 2020-03-07 ENCOUNTER — Encounter: Payer: Self-pay | Admitting: Obstetrics and Gynecology

## 2020-03-07 VITALS — BP 128/47 | HR 61 | Temp 97.9°F | Resp 16 | Ht 65.0 in | Wt 179.7 lb

## 2020-03-07 DIAGNOSIS — E785 Hyperlipidemia, unspecified: Secondary | ICD-10-CM | POA: Insufficient documentation

## 2020-03-07 DIAGNOSIS — Z79899 Other long term (current) drug therapy: Secondary | ICD-10-CM | POA: Insufficient documentation

## 2020-03-07 DIAGNOSIS — I10 Essential (primary) hypertension: Secondary | ICD-10-CM | POA: Diagnosis not present

## 2020-03-07 DIAGNOSIS — Z7901 Long term (current) use of anticoagulants: Secondary | ICD-10-CM | POA: Diagnosis not present

## 2020-03-07 DIAGNOSIS — C541 Malignant neoplasm of endometrium: Secondary | ICD-10-CM | POA: Diagnosis not present

## 2020-03-07 DIAGNOSIS — N183 Chronic kidney disease, stage 3 unspecified: Secondary | ICD-10-CM | POA: Diagnosis not present

## 2020-03-07 DIAGNOSIS — I129 Hypertensive chronic kidney disease with stage 1 through stage 4 chronic kidney disease, or unspecified chronic kidney disease: Secondary | ICD-10-CM | POA: Diagnosis not present

## 2020-03-07 DIAGNOSIS — Z8249 Family history of ischemic heart disease and other diseases of the circulatory system: Secondary | ICD-10-CM | POA: Diagnosis not present

## 2020-03-07 DIAGNOSIS — E039 Hypothyroidism, unspecified: Secondary | ICD-10-CM | POA: Insufficient documentation

## 2020-03-07 DIAGNOSIS — Z86711 Personal history of pulmonary embolism: Secondary | ICD-10-CM | POA: Insufficient documentation

## 2020-03-07 DIAGNOSIS — K589 Irritable bowel syndrome without diarrhea: Secondary | ICD-10-CM | POA: Insufficient documentation

## 2020-03-07 DIAGNOSIS — Z86718 Personal history of other venous thrombosis and embolism: Secondary | ICD-10-CM | POA: Insufficient documentation

## 2020-03-07 NOTE — Progress Notes (Signed)
[facility]  [address] [place], [place] [address] Phone: [phone]  Fax: [phone]   Clinic Day:  03/08/2020  Referring physician: Leone Haven, MD  Chief Complaint: Whitney Simmons is a 86 y.o. female with grade I endometrial cancer and multiple subsegmental pulmonary embolism on Coumadin who is seen for 3 month assessment.  HPI:  The patient was last seen in the medical oncology clinic on 12/07/2019. At that time, her vaginal bleeding had improved.  Exam was stable. Hematocrit was 36.6, hemoglobin 12.0, platelets 259,000, WBC 10,100. Creatinine was 1.45 (CrCl 32 ml/min). Calcium was 8.8. AST was 12. INR was 3.0.  The patient saw Dr. Theora Gianotti on 03/07/2020.  She underwent endometrial biopsy.  She has a follow-up in 3-4 months.  She has remained on Coumadin.  INR has ranged between 1.8 - 3.3.  INR was 2.2 on 02/29/2020.  She is currently on Coumadin 1 mg 6 days/week and 2 mg 1 day/week.  During the interim, she feels "well." She has had minimal vaginal bleeding. She is still short of breath and cannot lay flat on her back.  Her appetite is good.  She states that [place] is too far and she would like to follow-up with her primary care physician and gynecology as needed.   Past Medical History:  Diagnosis Date  . Bilateral pulmonary embolism ([place]) 10/2019  . Breast microcalcification, mammographic 01/31/2014  . 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 Declined postoperative radiation and antiestrogen therapy.  . Edema   . Hyperlipidemia   . Hypertension   . Hypothyroidism   . IBS (irritable bowel syndrome)   . Irregular heart beat   . stage III kidney disease   . Thyroid disease    s/p radioactive iodine ablation 30 years ago    Past Surgical History:  Procedure Laterality Date  . BREAST LUMPECTOMY Left 2015   DUCTAL CARCINOMA IN SITU WITH CALCIFICATIONS  . BREAST SURGERY Left 02-15-14    wide excision  . CATARACT EXTRACTION    . CHOLECYSTECTOMY    . HYSTEROSCOPY WITH D & C N/A 10/18/2019   Procedure: DILATATION AND CURETTAGE /HYSTEROSCOPY;  Surgeon: Homero Fellers, MD;  Location: [facility] ORS;  Service: Gynecology;  Laterality: N/A;  . TOE AMPUTATION     left 5th toe    Family History  Problem Relation Age of Onset  . Heart disease Mother   . Heart disease Father   . Heart attack Father   . Heart disease Maternal Aunt   . Heart disease Brother   . AAA (abdominal aortic aneurysm) Other   . Stroke Maternal Aunt   . Breast cancer Neg Hx     Social History:  reports that she has never smoked. She has never used smokeless tobacco. She reports that she does not drink alcohol and does not use drugs. The patient denies any exposure to radiation or toxins.  She is a retired Pharmacist, hospital.  The patient lives at [facility] [place].  Her husband is in a care facility in [facility].  Her daughter from [place] is currently in town. The patient is accompanied by her daughter today.   Allergies:  Allergies  Allergen Reactions  . Levaquin [Levofloxacin In D5w] Other (See Comments)    hallucinations  . Sulfa Antibiotics     Hallucinations     Current Medications: Current Outpatient Medications  Medication Sig Dispense Refill  . furosemide (LASIX) 20  MG tablet Take 1 tablet (20 mg total) by mouth daily. 90 tablet 2  . latanoprost (XALATAN) 0.005 % ophthalmic solution Place 1 drop into both eyes at bedtime.     Marland Kitchen levothyroxine (EUTHYROX) 88 MCG tablet TAKE 1 TABLET BY MOUTH ONCE DAILY BEFORE BREAKFAST 90 tablet 3  . losartan (COZAAR) 25 MG tablet Take 1 tablet (25 mg total) by mouth daily. 30 tablet 1  . nebivolol (BYSTOLIC) 5 MG tablet Take 1 tablet (5 mg total) by mouth daily. 90 tablet 2  . warfarin (COUMADIN) 1 MG tablet TAKE AS DIRECTED BY MD BASED ON INR 30 tablet 1   No current facility-administered medications for this visit.    Review of Systems  Constitutional:  Negative for chills, diaphoresis, fever, malaise/fatigue and weight loss (up 3 lbs).       Feels "well."  HENT: Positive for hearing loss. Negative for congestion, ear discharge, ear pain, nosebleeds, sinus pain, sore throat and tinnitus.   Eyes: Negative for blurred vision and double vision.  Respiratory: Positive for shortness of breath. Negative for cough, hemoptysis and sputum production.   Cardiovascular: Positive for orthopnea. Negative for chest pain, palpitations and leg swelling.  Gastrointestinal: Negative for abdominal pain, blood in stool, constipation, diarrhea, heartburn, melena, nausea and vomiting.       Good appetite.  Genitourinary: Negative for dysuria, frequency and urgency.       Minimal vaginal bleeding.  Musculoskeletal: Negative for back pain, falls, joint pain, myalgias and neck pain.  Skin: Negative for itching and rash.  Neurological: Negative for dizziness, tingling, sensory change, weakness and headaches.  Endo/Heme/Allergies: Negative for environmental allergies. Does not bruise/bleed easily.  Psychiatric/Behavioral: Negative for depression and memory loss. The patient is nervous/anxious (nervous for visit). The patient does not have insomnia.        Easily stressed.   All other systems reviewed and are negative.  Performance status (ECOG): 2  Vitals Blood pressure (!) 180/57, pulse (!) 56, temperature 97.9 F (36.6 C), temperature source Tympanic, weight 180 lb 3.6 oz (81.8 kg), SpO2 98 %.   Physical Exam Vitals and nursing note reviewed.  Constitutional:      General: She is not in acute distress.    Appearance: Normal appearance. She is well-developed. She is not diaphoretic.     Interventions: Face mask in place.     Comments: Elderly woman sitting comfortably in the exam room in no acute distress. She has a rolling walker by her side. Required assistance onto exam table.  HENT:     Head: Normocephalic and atraumatic.     Comments: Pearline Cables styled  hair.    Mouth/Throat:     Mouth: Mucous membranes are moist. No oral lesions.     Pharynx: Oropharynx is clear.  Eyes:     General: No scleral icterus.       Right eye: No discharge.        Left eye: No discharge.     Extraocular Movements: Extraocular movements intact.     Conjunctiva/sclera: Conjunctivae normal.     Pupils: Pupils are equal, round, and reactive to light.     Comments: Glasses.  Blue eyes.   Neck:     Vascular: No JVD.  Cardiovascular:     Rate and Rhythm: Normal rate and regular rhythm.     Heart sounds: Normal heart sounds. No murmur heard.  No friction rub. No gallop.   Pulmonary:     Effort: Pulmonary effort is normal. No respiratory  distress.     Breath sounds: Wheezing (slight) present. No rhonchi or rales.  Chest:     Chest wall: No tenderness.  Abdominal:     General: Bowel sounds are normal. There is no distension.     Palpations: Abdomen is soft. There is no mass.     Tenderness: There is no abdominal tenderness. There is no guarding or rebound.  Musculoskeletal:        General: No tenderness. Normal range of motion.     Cervical back: Normal range of motion and neck supple.  Lymphadenopathy:     Head:     Right side of head: No preauricular, posterior auricular or occipital adenopathy.     Left side of head: No preauricular, posterior auricular or occipital adenopathy.     Cervical: No cervical adenopathy.     Upper Body:     Right upper body: No supraclavicular or axillary adenopathy.     Left upper body: No supraclavicular or axillary adenopathy.     Lower Body: No right inguinal adenopathy. No left inguinal adenopathy.  Skin:    General: Skin is warm and dry.     Coloration: Skin is not pale.     Findings: No bruising, erythema, lesion or rash.  Neurological:     Mental Status: She is alert and oriented to person, place, and time.  Psychiatric:        Behavior: Behavior normal.        Thought Content: Thought content normal.         Judgment: Judgment normal.    Appointment on 03/08/2020  Component Date Value Ref Range Status  . Sodium 03/08/2020 140  135 - 145 mmol/L Final  . Potassium 03/08/2020 4.6  3.5 - 5.1 mmol/L Final  . Chloride 03/08/2020 106  98 - 111 mmol/L Final  . CO2 03/08/2020 28  22 - 32 mmol/L Final  . Glucose, Bld 03/08/2020 99  70 - 99 mg/dL Final   Glucose reference range applies only to samples taken after fasting for at least 8 hours.  . BUN 03/08/2020 26* 8 - 23 mg/dL Final  . Creatinine, Ser 03/08/2020 1.34* 0.44 - 1.00 mg/dL Final  . Calcium 03/08/2020 8.4* 8.9 - 10.3 mg/dL Final  . Total Protein 03/08/2020 7.0  6.5 - 8.1 g/dL Final  . Albumin 03/08/2020 3.6  3.5 - 5.0 g/dL Final  . AST 03/08/2020 13* 15 - 41 U/L Final  . ALT 03/08/2020 7  0 - 44 U/L Final  . Alkaline Phosphatase 03/08/2020 62  38 - 126 U/L Final  . Total Bilirubin 03/08/2020 0.4  0.3 - 1.2 mg/dL Final  . GFR calc non Af Amer 03/08/2020 35* >60 mL/min Final  . GFR calc Af Amer 03/08/2020 40* >60 mL/min Final  . Anion gap 03/08/2020 6  5 - 15 Final   Performed at [facility], [address] [place], [place] [address]  . WBC 03/08/2020 8.0  4.0 - 10.5 K/uL Final  . RBC 03/08/2020 4.14  3.87 - 5.11 MIL/uL Final  . Hemoglobin 03/08/2020 12.2  12.0 - 15.0 g/dL Final  . HCT 03/08/2020 38.5  36 - 46 % Final  . MCV 03/08/2020 93.0  80.0 - 100.0 fL Final  . MCH 03/08/2020 29.5  26.0 - 34.0 pg Final  . MCHC 03/08/2020 31.7  30.0 - 36.0 g/dL Final  . RDW 03/08/2020 13.6  11.5 - 15.5 % Final  . Platelets 03/08/2020 219  150 - 400  K/uL Final  . nRBC 03/08/2020 0.0  0.0 - 0.2 % Final  . Neutrophils Relative % 03/08/2020 67  % Final  . Neutro Abs 03/08/2020 5.4  1.7 - 7.7 K/uL Final  . Lymphocytes Relative 03/08/2020 17  % Final  . Lymphs Abs 03/08/2020 1.4  0.7 - 4.0 K/uL Final  . Monocytes Relative 03/08/2020 12  % Final  . Monocytes Absolute 03/08/2020 1.0  0 - 1 K/uL Final  . Eosinophils Relative 03/08/2020 1   % Final  . Eosinophils Absolute 03/08/2020 0.1  0 - 0 K/uL Final  . Basophils Relative 03/08/2020 2  % Final  . Basophils Absolute 03/08/2020 0.1  0 - 0 K/uL Final  . Immature Granulocytes 03/08/2020 1  % Final  . Abs Immature Granulocytes 03/08/2020 0.05  0.00 - 0.07 K/uL Final   Performed at [facility], [address], [place], [place] [address]    Assessment:  KARIAH LOREDO is a 86 y.o. female with localizedendometrial canceradmitted with right lower extremity thrombosis and bilateral pulmonary emboli.Chest, abdomen, and pelvis CT on 11/08/2019 revealed no evidence of metastatic disease.Mirena IUDwas placed on 11/09/2019.  Right lower extremity duplexon 05/15/2021revealed a nonocclusive thrombus/superficial thrombophlebitisof the greater saphenous vein below the level of the knee.  Chest CT angiogramon 11/13/2019 revealed a partially occlusive segmental and subsegmental bilateral pulmonary emboli within the right middle lobe, right lower lobe, left upper lobe and left lower lobes.There was no evidence of right ventricular heart strain.  Hypercoagulable work-up on 11/04/2019 was negative for the following: factor V Leiden, prothrombin gene mutation, lupus anticoagulant panel, anticardiolipin antibodies and beta-2 glycoprotein antibodies.  She is currently on Coumadin.  She received Coumadin 5 mg on 05/17, 5 mg on 05/18, 5 mg on 05/19, 2.5 mg on 11/17/2019.  Coumadin was discontinued on 11/25/2019.  She received Coumadin 1 mg on 12/01/2019, 1 mg on 12/02/2019, 2 mg on 12/03/2019, 2 mg on 12/04/2019, 2 mg on 12/05/2019, and 1 mg on 12/06/2019.  INR has been followed: 1.1 on 11/13/2019, 1.4 on 11/16/2019, 2.1 on 11/17/2019, 2.7 on 11/18/2019, 7.8 on 11/25/2019, 4.1 on 11/29/2019, 3.0 on 11/30/2019, 2.4 on 12/01/2019, 2.3 on 12/02/2019, 2.0 on 12/03/2019, 2.1 on 12/04/2019, and 2.2 on 12/05/2019.  She has chronic renal insufficiency.Creatinine was 1.47(CrCl  27.4 ml/min) on 11/30/2019.  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  Platelet count had dropped from 216,000 on 11/18/2019 to 97,000 on 11/25/2019.  Platelet count was subsequently 198,000 on 11/29/2019 and 259,000 on 11/30/2019.  Notation made on the platelet count result of 11/25/2019 that " result may be falsely decreased due to platelet clumping).  Per patient, she has a history of pseudothrombocytopenia.  She has a distant history of pseudothrombocytopenia.  Platelet count on 11/25/2019 was noted to be "falsely decreased due to platelet clumping".   The patient received the [place] COVID-19 vaccine on 07/11/2019 and 08/01/2019.  Symptomatically, she feels "well." She has had minimal vaginal bleeding. She remains short of breath.  She states that [place] is too far; she would like to follow-up with her primary care physician and gynecology as needed.  Plan: 1.   Labs today:  CBC with diff, CMP, PT/INR. 2.   Endometrial cancer She has localized disease. She declined surgery and opted for a Mirena IUD (placed 11/09/2019). Mirena IUD can be associated with thrombosis, but typically very little systemic absorption. Megace contraindicated (  higher risk of thrombosis). Surgery and radiation were previously discussed with patient.  IUD remains in place.             Patient is s/p recent endometrial biopsy.  Patient will follow-up with gynecology-oncology secondary to clinic distance. 3. Bilateral pulmonary emboli and right superficial thrombophlebitis INR is 2.3.  Patient acutely developed symptoms after IUD placement. Etiology of thrombosis likely multi-factorial: age, malignancy, decreased mobility. Hypercoagulable was negative.  She is on Coumadin 1 mg 6 days/week and 2 mg 1  day/week. She notes minimal vaginal bleeding. 4.   RTC prn.  I discussed the assessment and treatment plan with the patient.  The patient was provided an opportunity to ask questions and all were answered.  The patient agreed with the plan and demonstrated an understanding of the instructions.  The patient was advised to call back if the symptoms worsen or if the condition fails to improve as anticipated.   Lequita Asal, MD, PhD    03/08/2020, 1:54 PM  I, Evert Kohl, am acting as a Education administrator for Lequita Asal, MD.  I, [place] Mike Gip, MD, have reviewed the above documentation for accuracy and completeness, and I agree with the above.

## 2020-03-07 NOTE — Progress Notes (Signed)
Gynecologic Oncology Consult Visit   Referring Provider: Adrian Prows, MD  Chief Concern: endometrial adenocarcinoma, endometrioid type, FIGO grade 1  Subjective:  Whitney Simmons is a 86 y.o. female who is seen in consultation from Dr. Gilman Schmidt for endometrial adenocarcinoma, endometrioid type, FIGO grade 1, s/p Mirena IUD who returns to clinic today for follow up.   At previous visit we discussed options for management including surgery, hormonal therapy, and radiation. She is very interested in hormonal therapy and we discussed Megace vs Mirena IUD. She was not interested in radiation therapy. She declined MRI due to claustrophobia.   CT 11/08/19 CT C/A/P WO Contrast did not show evidence of metastatic disease or other acute findings.   She had IUD placed on 11/09/19 with Dr. Gilman Schmidt and returns to clinic today for endometrial biopsy.   Today, she complains of lesion in/on left side of her vulvar. Says she's had these in the past but they have typically resolved on their own. Unsure if she's ever had one biopsied.    Gynecologic Oncology History:  She presented with postmenopausal bleeding. Work up included ultrasound, D&C, Myosure/Hysteroscopy.  09/05/2019 Pelvic ultrasound Findings:  The uterus is anteverted and measures 8.3 x 5.0 x 3.9 cm. Echo texture is heterogenous without evidence of focal masses. The Endometrium measures 5.2 mm. There is an echogenic mass within the endometrium measuring 22.2 x 15.2 x 21.0 mm. Minimal blood flow is seen within.   Bilateral ovaries are not visualized.  Survey of the adnexa demonstrates no adnexal masses. There is no free fluid in the cul de sac.  Impression: 1. There is an echogenic mass within the endometrial canal.  2. The endometrial lining is slightly thick for a postmenopausal woman. 3. Neither ovary is visualized.    10/18/2019 D&C, Myosure/Hysteroscopy Exam under anesthesia revealed  9 cm uterus with bilateral adnexa without  masses or fullness. Hysteroscopy revealed an anterior uterine mass with irregular contour, not consistent with fibroid or polyp. Mass obscured the tubal ostia. Normal appearing endocervical canal.  A. ENDOMETRIUM, CURETTAGE:  - ENDOMETRIAL ADENOCARCINOMA, ENDOMETRIOID TYPE, FIGO GRADE 1.  - BACKGROUND ATYPICAL HYPERPLASIA / EIN.   She presents today for evaluation and management. Since the procedure she has some vaginal spotting. She is concerned about surgery due to risks. She is interested in alternative options. She is accompanied to clinic with her daughter Whitney Simmons who leaves near [place], [place].     Problem List: Patient Active Problem List   Diagnosis Date Noted  . AKI (acute kidney injury) ([place]) 12/14/2019  . HIT (heparin-induced thrombocytopenia) ([place]) 12/05/2019  . 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 Goals of care, counseling/discussion 12/04/2019  . Superficial thrombophlebitis of right leg 11/13/2019  . Pulmonary embolism ([place]) 11/13/2019  . IUD (intrauterine device) in place 11/13/2019  . Endometrial cancer, grade I ([place]) 10/25/2019  . Postmenopausal bleeding   . Preoperative exam for gynecologic surgery 10/05/2019  . Hallucination, visual 10/05/2019  . Vaginal discharge 06/30/2019  . Difficulty hearing 12/06/2018  . Bruising 05/31/2018  . Gait instability 09/22/2017  . Nevus 05/29/2017  . Callus of foot 01/05/2017  . Thrombocytopenia ([place]) 11/06/2015  . Osteoporosis 03/07/2015  . Postmenopausal estrogen deficiency 01/23/2015  . Glaucoma 05/12/2014  . History of breast cancer 02/13/2014  . IBS (irritable bowel syndrome) 12/09/2013  . Palpitations 09/01/2012  . Chronic kidney disease, stage III (moderate) 08/18/2012  . Anxiety 08/02/2012  . Hypothyroidism 07/30/2012  . Hyperlipidemia 07/30/2012  . Hypertension 07/30/2012    Past Medical History: Past  Medical History:  Diagnosis Date  . Breast microcalcification, mammographic 01/31/2014  . 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 Declined postoperative radiation and antiestrogen therapy.  . Edema   . Hyperlipidemia   . Hypertension   . Hypothyroidism   . IBS (irritable bowel syndrome)   . Irregular heart beat   . stage III kidney disease   . Thyroid disease    s/p radioactive iodine ablation 30 years ago    Past Surgical History: Past Surgical History:  Procedure Laterality Date  . BREAST LUMPECTOMY Left 2015   DUCTAL CARCINOMA IN SITU WITH CALCIFICATIONS  . BREAST SURGERY Left 02-15-14   wide excision  . CATARACT EXTRACTION    . CHOLECYSTECTOMY    . HYSTEROSCOPY WITH D & C N/A 10/18/2019   Procedure: DILATATION AND CURETTAGE /HYSTEROSCOPY;  Surgeon: Homero Fellers, MD;  Location: [facility] ORS;  Service: Gynecology;  Laterality: N/A;  . TOE AMPUTATION     left 5th toe    Past Gynecologic History:  Menarche: 15 Menses regular: She does not remember her menstrual h/o or when she underwent menopause. History of Abnormal pap: No Last pap: Unknown  Mammogram 03/2019  OB History:  OB History  Gravida Para Term Preterm AB Living  2         2  SAB TAB Ectopic Multiple Live Births               # Outcome Date GA Lbr Len/2nd Weight Sex Delivery Anes PTL Lv  2 Gravida           1 Gravida             Family History: Family History  Problem Relation Age of Onset  . Heart disease Mother   . Heart disease Father   . Heart attack Father   . Heart disease Maternal Aunt   . Heart disease Brother   . AAA (abdominal aortic aneurysm) Other   . Stroke Maternal Aunt   . Breast cancer Neg Hx     Social History: Social History   Socioeconomic History  . Marital status: Married    Spouse name: Not on file  . Number of children: Not on file  . Years of education: Not on file  . Highest education level: Not on file  Occupational History  . Not on file  Tobacco Use  . Smoking status: Never Smoker  . Smokeless tobacco: Never Used   Vaping Use  . Vaping Use: Never used  Substance and Sexual Activity  . Alcohol use: No  . Drug use: No  . Sexual activity: Not Currently    Birth control/protection: Post-menopausal  Other Topics Concern  . Not on file  Social History Narrative   Lives in [place] at [place]. From [place]. Has daughter.      Work- Retired Psychologist, prison and probation services Strain:   . Difficulty of Paying Living Expenses: Not on file  Food Insecurity:   . Worried About Charity fundraiser in the Last Year: Not on file  . Ran Out of Food in the Last Year: Not on file  Transportation Needs:   . Lack of Transportation (Medical): Not on file  . Lack of Transportation (Non-Medical): Not on file  Physical Activity:   . Days of Exercise per Week: Not on file  . Minutes of Exercise per Session: Not on file  Stress:   .  Feeling of Stress : Not on file  Social Connections:   . Frequency of Communication with Friends and Family: Not on file  . Frequency of Social Gatherings with Friends and Family: Not on file  . Attends Religious Services: Not on file  . Active Member of Clubs or Organizations: Not on file  . Attends Archivist Meetings: Not on file  . Marital Status: Not on file  Intimate Partner Violence:   . Fear of Current or Ex-Partner: Not on file  . Emotionally Abused: Not on file  . Physically Abused: Not on file  . Sexually Abused: Not on file    Allergies: Allergies  Allergen Reactions  . Levaquin [Levofloxacin In D5w] Other (See Comments)    hallucinations  . Sulfa Antibiotics     Hallucinations     Current Medications: Current Outpatient Medications  Medication Sig Dispense Refill  . furosemide (LASIX) 20 MG tablet Take 1 tablet (20 mg total) by mouth daily. 90 tablet 2  . latanoprost (XALATAN) 0.005 % ophthalmic solution Place 1 drop into both eyes at bedtime.     Marland Kitchen levothyroxine (EUTHYROX) 88 MCG tablet TAKE 1 TABLET BY MOUTH  ONCE DAILY BEFORE BREAKFAST 90 tablet 3  . losartan (COZAAR) 25 MG tablet Take 1 tablet (25 mg total) by mouth daily. 30 tablet 1  . nebivolol (BYSTOLIC) 5 MG tablet Take 1 tablet (5 mg total) by mouth daily. 90 tablet 2  . warfarin (COUMADIN) 1 MG tablet TAKE AS DIRECTED BY MD BASED ON INR 30 tablet 1   No current facility-administered medications for this visit.   Review of Systems General:  Fatigue, weakness Skin: no complaints Eyes: no complaints HEENT: no complaints Breasts: no complaints Pulmonary: no complaints Cardiac: no complaints Gastrointestinal: abdominal bloating/distention, decreased appetite Genitourinary/Sexual: vaginal spotting Ob/Gyn: no complaints Musculoskeletal: no complaints Hematology: no complaints Neurologic/Psych: no complaints  Objective:  Physical Examination:  BP (!) 128/47   Pulse 61   Temp 97.9 F (36.6 C) (Oral)   Resp 16   Ht 5\' 5"  (1.651 m)   Wt 179 lb 11.2 oz (81.5 kg)   BMI 29.90 kg/m    ECOG Performance Status: 2 - Symptomatic, <50% confined to bed  GENERAL: Patient is a well appearing female in no acute distress HEENT:  Sclera clear. Anicteric NODES:  Negative axillary, supraclavicular, inguinal lymph node survery LUNGS:  Clear to auscultation bilaterally.   HEART:  Regular rate and rhythm.  ABDOMEN:  Soft, nontender.  No hernias, incisions well healed. No masses or ascites EXTREMITIES:  No peripheral edema. Atraumatic. No cyanosis SKIN:  Clear with no obvious rashes or skin changes.  NEURO:  Nonfocal. Well oriented.  Appropriate affect.   Pelvic: chaperoned by CMA EGBUS: no lesions on the vulva. At 5 o'clock vulvar/buttock tissue she had a 8 mm raised area that appeared to be a healed follicular cyst.  Cervix: no lesions, nontender, mobile. IUD string seen Vagina: no lesions, no discharge or bleeding Uterus: normal size, nontender, mobile Adnexa: no palpable masses; parametria smooth and without nodularity.  Rectovaginal:  deferred  EMBx performed.   The risks and benefits of the procedure were reviewed and informed consent obtained. Time out was performed. The patient received pre-procedure teaching and expressed understanding. The post-procedure instructions were reviewed with the patient and she expressed understanding. The patient does not have any barriers to learning.  Speculum placed in the vaginal vault and cervix identified. Cervix cleansed with Betadine. The pipelle was advanced to 7.5  cm and tissue obtained without difficult. Hemostasis was excellent.   Post-procedure evaluation the patient was stable without complaints.   Lab Review Lab Results  Component Value Date   WBC 10.1 11/30/2019   HGB 12.0 11/30/2019   HCT 36.6 11/30/2019   MCV 88.6 11/30/2019   PLT 259 11/30/2019     Chemistry      Component Value Date/Time   NA 134 (L) 12/14/2019 1016   K 4.3 12/14/2019 1016   CL 102 12/14/2019 1016   CO2 23 12/14/2019 1016   BUN 22 12/14/2019 1016   CREATININE 1.31 (H) 12/14/2019 1016   CREATININE 1.09 12/09/2013 1619      Component Value Date/Time   CALCIUM 8.4 12/14/2019 1016   ALKPHOS 77 11/30/2019 0603   AST 12 (L) 11/30/2019 0603   ALT 13 11/30/2019 0603   BILITOT 0.9 11/30/2019 0603      Radiologic Imaging: Per hpi     Assessment:  SOFFIA DOSHIER is a 86 y.o. female diagnosed with grade 1 endometrioid endometrial cancer s/p IUD.   Performance status 2.   Medical co-morbidities complicating care: Frailty; HTN; hypothyroidism; irregular heartbeat; stage III kidney disease (last creatinine 1.36).   Plan:   Problem List Items Addressed This Visit      Genitourinary   Endometrial cancer, grade I ([place]) - Primary     Follow up endometrial biopsy. Further management pending on results. Plan to follow up in 3-4 months.   The patient's diagnosis, an outline of the further diagnostic and laboratory studies which will be required, the recommendation, and alternatives were  discussed.  All questions were answered to the patient's satisfaction.   Verlon Au, NP  I personally had a face to face interaction and evaluated the patient jointly with the NP, Ms. Beckey Rutter.  I have reviewed her history and available records and have performed the key portions of the physical exam including lymph node survey, abdominal exam, pelvic exam with my findings confirming those documented above by the APP.  I have discussed the case with the APP and the patient.  I agree with the above documentation, assessment and plan which was fully formulated by me.  Counseling was completed by me. I performed the EMBx.    I personally saw the patient and performed a substantive portion of this encounter in conjunction with the listed APP as documented above.  Cicley Ganesh Gaetana Michaelis, MD

## 2020-03-07 NOTE — Progress Notes (Signed)
Pt here as f/u for endometrial cancer. She has IUD in and she feels something on left side of vagina and thinks it has got bigger. Does this change anything about IUD the pt wants to know

## 2020-03-08 ENCOUNTER — Inpatient Hospital Stay: Payer: PPO

## 2020-03-08 ENCOUNTER — Encounter: Payer: Self-pay | Admitting: Hematology and Oncology

## 2020-03-08 ENCOUNTER — Inpatient Hospital Stay (HOSPITAL_BASED_OUTPATIENT_CLINIC_OR_DEPARTMENT_OTHER): Payer: PPO | Admitting: Hematology and Oncology

## 2020-03-08 ENCOUNTER — Telehealth: Payer: Self-pay

## 2020-03-08 VITALS — BP 180/57 | HR 56 | Temp 97.9°F | Wt 180.2 lb

## 2020-03-08 DIAGNOSIS — Z7189 Other specified counseling: Secondary | ICD-10-CM

## 2020-03-08 DIAGNOSIS — D75829 Heparin-induced thrombocytopenia, unspecified: Secondary | ICD-10-CM

## 2020-03-08 DIAGNOSIS — C541 Malignant neoplasm of endometrium: Secondary | ICD-10-CM | POA: Diagnosis not present

## 2020-03-08 DIAGNOSIS — I2694 Multiple subsegmental pulmonary emboli without acute cor pulmonale: Secondary | ICD-10-CM

## 2020-03-08 DIAGNOSIS — R898 Other abnormal findings in specimens from other organs, systems and tissues: Secondary | ICD-10-CM

## 2020-03-08 DIAGNOSIS — D7582 Heparin induced thrombocytopenia (HIT): Secondary | ICD-10-CM

## 2020-03-08 LAB — COMPREHENSIVE METABOLIC PANEL
ALT: 7 U/L (ref 0–44)
AST: 13 U/L — ABNORMAL LOW (ref 15–41)
Albumin: 3.6 g/dL (ref 3.5–5.0)
Alkaline Phosphatase: 62 U/L (ref 38–126)
Anion gap: 6 (ref 5–15)
BUN: 26 mg/dL — ABNORMAL HIGH (ref 8–23)
CO2: 28 mmol/L (ref 22–32)
Calcium: 8.4 mg/dL — ABNORMAL LOW (ref 8.9–10.3)
Chloride: 106 mmol/L (ref 98–111)
Creatinine, Ser: 1.34 mg/dL — ABNORMAL HIGH (ref 0.44–1.00)
GFR calc Af Amer: 40 mL/min — ABNORMAL LOW (ref >60)
GFR calc non Af Amer: 35 mL/min — ABNORMAL LOW (ref >60)
Glucose, Bld: 99 mg/dL (ref 70–99)
Potassium: 4.6 mmol/L (ref 3.5–5.1)
Sodium: 140 mmol/L (ref 135–145)
Total Bilirubin: 0.4 mg/dL (ref 0.3–1.2)
Total Protein: 7 g/dL (ref 6.5–8.1)

## 2020-03-08 LAB — CBC WITH DIFFERENTIAL/PLATELET
Abs Immature Granulocytes: 0.05 10*3/uL (ref 0.00–0.07)
Basophils Absolute: 0.1 10*3/uL (ref 0.0–0.1)
Basophils Relative: 2 %
Eosinophils Absolute: 0.1 10*3/uL (ref 0.0–0.5)
Eosinophils Relative: 1 %
HCT: 38.5 % (ref 36.0–46.0)
Hemoglobin: 12.2 g/dL (ref 12.0–15.0)
Immature Granulocytes: 1 %
Lymphocytes Relative: 17 %
Lymphs Abs: 1.4 10*3/uL (ref 0.7–4.0)
MCH: 29.5 pg (ref 26.0–34.0)
MCHC: 31.7 g/dL (ref 30.0–36.0)
MCV: 93 fL (ref 80.0–100.0)
Monocytes Absolute: 1 10*3/uL (ref 0.1–1.0)
Monocytes Relative: 12 %
Neutro Abs: 5.4 10*3/uL (ref 1.7–7.7)
Neutrophils Relative %: 67 %
Platelets: 219 10*3/uL (ref 150–400)
RBC: 4.14 MIL/uL (ref 3.87–5.11)
RDW: 13.6 % (ref 11.5–15.5)
WBC: 8 10*3/uL (ref 4.0–10.5)
nRBC: 0 % (ref 0.0–0.2)

## 2020-03-08 LAB — PROTIME-INR
INR: 2.3 — ABNORMAL HIGH (ref 0.8–1.2)
Prothrombin Time: 24.3 seconds — ABNORMAL HIGH (ref 11.4–15.2)

## 2020-03-08 NOTE — Progress Notes (Signed)
Patient here for oncology follow-up appointment, expresses no complaints or concerns at this time. Daughter upset with staff and also states it is too much trouble coming to [facility], asking if someone else could follow up with her blood history.

## 2020-03-09 ENCOUNTER — Telehealth: Payer: Self-pay

## 2020-03-09 ENCOUNTER — Ambulatory Visit: Payer: PPO | Admitting: Obstetrics and Gynecology

## 2020-03-09 LAB — SURGICAL PATHOLOGY

## 2020-03-09 NOTE — Telephone Encounter (Signed)
Called and reviewed results of biopsy with Whitney Simmons. Per Dr. Theora Gianotti we will set up an appt for her to come back and see her in clinic to discuss these results. Option is to continue the IUD and repeat biopsy in 3 months or radiation.

## 2020-03-14 ENCOUNTER — Other Ambulatory Visit: Payer: PPO

## 2020-03-14 ENCOUNTER — Ambulatory Visit: Payer: PPO

## 2020-03-19 DIAGNOSIS — H401121 Primary open-angle glaucoma, left eye, mild stage: Secondary | ICD-10-CM | POA: Diagnosis not present

## 2020-03-21 ENCOUNTER — Other Ambulatory Visit (INDEPENDENT_AMBULATORY_CARE_PROVIDER_SITE_OTHER): Payer: PPO

## 2020-03-21 ENCOUNTER — Inpatient Hospital Stay (HOSPITAL_BASED_OUTPATIENT_CLINIC_OR_DEPARTMENT_OTHER): Payer: PPO | Admitting: Obstetrics and Gynecology

## 2020-03-21 ENCOUNTER — Other Ambulatory Visit: Payer: Self-pay

## 2020-03-21 ENCOUNTER — Encounter: Payer: Self-pay | Admitting: Obstetrics and Gynecology

## 2020-03-21 VITALS — BP 134/46 | HR 76 | Temp 97.8°F | Resp 20 | Wt 180.8 lb

## 2020-03-21 DIAGNOSIS — C541 Malignant neoplasm of endometrium: Secondary | ICD-10-CM

## 2020-03-21 DIAGNOSIS — Z23 Encounter for immunization: Secondary | ICD-10-CM

## 2020-03-21 DIAGNOSIS — Z7901 Long term (current) use of anticoagulants: Secondary | ICD-10-CM

## 2020-03-21 LAB — PROTIME-INR
INR: 2.2 ratio — ABNORMAL HIGH (ref 0.8–1.0)
Prothrombin Time: 24.6 s — ABNORMAL HIGH (ref 9.6–13.1)

## 2020-03-21 NOTE — Progress Notes (Signed)
Gynecologic Oncology Interval Visit   Referring Provider: Adrian Prows, MD  Chief Concern: endometrial adenocarcinoma, endometrioid type, FIGO grade 1  Subjective:  Whitney Simmons is a 86 y.o. female who is seen in consultation from Dr. Gilman Simmons for endometrial adenocarcinoma, endometrioid type, FIGO grade 1, s/p Mirena IUD who returns to clinic today for discussion of pathology results and discussion of further management.   At previous visit we discussed options for management including surgery, hormonal therapy, and radiation. She is very interested in hormonal therapy and we discussed Megace vs Mirena IUD. She was not interested in radiation therapy. She declined MRI due to claustrophobia.   Endometrial biopsy was performed on 03/07/20   A. ENDOMETRIUM; PIPELLE BIOPSY:  - RESIDUAL ENDOMETRIAL ADENOCARCINOMA.  - BACKGROUND ENDOMETRIUM WITH PROGESTIN EFFECT.   She has a history of bilateral subsegmental pulmonary embolism and is currently on coumadin.   She presents today to discuss treatment options.    Gynecologic Oncology History:  She presented with postmenopausal bleeding. Work up included ultrasound, D&C, Myosure/Hysteroscopy.  09/05/2019 Pelvic ultrasound Findings:  The uterus is anteverted and measures 8.3 x 5.0 x 3.9 cm. Echo texture is heterogenous without evidence of focal masses. The Endometrium measures 5.2 mm. There is an echogenic mass within the endometrium measuring 22.2 x 15.2 x 21.0 mm. Minimal blood flow is seen within.   Bilateral ovaries are not visualized.  Survey of the adnexa demonstrates no adnexal masses. There is no free fluid in the cul de sac.  Impression: 1. There is an echogenic mass within the endometrial canal.  2. The endometrial lining is slightly thick for a postmenopausal woman. 3. Neither ovary is visualized.    10/18/2019 D&C, Myosure/Hysteroscopy Exam under anesthesia revealed  9 cm uterus with bilateral adnexa without masses  or fullness. Hysteroscopy revealed an anterior uterine mass with irregular contour, not consistent with fibroid or polyp. Mass obscured the tubal ostia. Normal appearing endocervical canal.  A. ENDOMETRIUM, CURETTAGE:  - ENDOMETRIAL ADENOCARCINOMA, ENDOMETRIOID TYPE, FIGO GRADE 1.  - BACKGROUND ATYPICAL HYPERPLASIA / EIN.   11/08/19 CT C/A/P WO Contrast did not show evidence of metastatic disease or other acute findings.   11/09/19 She had IUD placed with Dr. Gilman Simmons   11/13/2019 After the IUD she was diagnosed with bilateral pulmonary embolism Chest CT: Partially occlusive segmental and subsegmental bilateral pulmonary emboli as described above within the right middle lobe, right lower lobe, left upper lobe and left lower lobes. No evidence of right ventricular heart strain.   03/07/20. EMBx A. ENDOMETRIUM; PIPELLE BIOPSY:  - RESIDUAL ENDOMETRIAL ADENOCARCINOMA.  - BACKGROUND ENDOMETRIUM WITH PROGESTIN EFFECT.  She presented today for evaluation and management. She is accompanied to clinic with her daughter Whitney Simmons who leaves near [place], [place].      Problem List: Patient Active Problem List   Diagnosis Date Noted  . AKI (acute kidney injury) ([place]) 12/14/2019  . HIT (heparin-induced thrombocytopenia) ([place]) 12/05/2019  . 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 Goals of care, counseling/discussion 12/04/2019  . Superficial thrombophlebitis of right leg 11/13/2019  . Pulmonary embolism ([place]) 11/13/2019  . IUD (intrauterine device) in place 11/13/2019  . Endometrial cancer, grade I ([place]) 10/25/2019  . Postmenopausal bleeding   . Preoperative exam for gynecologic surgery 10/05/2019  . Hallucination, visual 10/05/2019  . Vaginal discharge 06/30/2019  . Difficulty hearing 12/06/2018  . Bruising 05/31/2018  . Gait instability 09/22/2017  . Nevus 05/29/2017  . Callus of foot 01/05/2017  . Thrombocytopenia ([place]) 11/06/2015  . Osteoporosis 03/07/2015  .  Postmenopausal estrogen  deficiency 01/23/2015  . Glaucoma 05/12/2014  . History of breast cancer 02/13/2014  . IBS (irritable bowel syndrome) 12/09/2013  . Palpitations 09/01/2012  . Chronic kidney disease, stage III (moderate) 08/18/2012  . Anxiety 08/02/2012  . Hypothyroidism 07/30/2012  . Hyperlipidemia 07/30/2012  . Hypertension 07/30/2012    Past Medical History: Past Medical History:  Diagnosis Date  . Bilateral pulmonary embolism ([place]) 10/2019  . Breast microcalcification, mammographic 01/31/2014  . 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 Declined postoperative radiation and antiestrogen therapy.  . Edema   . Hyperlipidemia   . Hypertension   . Hypothyroidism   . IBS (irritable bowel syndrome)   . Irregular heart beat   . stage III kidney disease   . Thyroid disease    s/p radioactive iodine ablation 30 years ago    Past Surgical History: Past Surgical History:  Procedure Laterality Date  . BREAST LUMPECTOMY Left 2015   DUCTAL CARCINOMA IN SITU WITH CALCIFICATIONS  . BREAST SURGERY Left 02-15-14   wide excision  . CATARACT EXTRACTION    . CHOLECYSTECTOMY    . HYSTEROSCOPY WITH D & C N/A 10/18/2019   Procedure: DILATATION AND CURETTAGE /HYSTEROSCOPY;  Surgeon: Whitney Fellers, MD;  Location: [facility] ORS;  Service: Gynecology;  Laterality: N/A;  . TOE AMPUTATION     left 5th toe    Past Gynecologic History:  Menarche: 15 Menses regular: She does not remember her menstrual h/o or when she underwent menopause. History of Abnormal pap: No Last pap: Unknown  Mammogram 03/2019  OB History:  OB History  Gravida Para Term Preterm AB Living  2         2  SAB TAB Ectopic Multiple Live Births               # Outcome Date GA Lbr Len/2nd Weight Sex Delivery Anes PTL Lv  2 Gravida           1 Gravida             Family History: Family History  Problem Relation Age of Onset  . Heart disease Mother   . Heart disease Father   . Heart attack  Father   . Heart disease Maternal Aunt   . Heart disease Brother   . AAA (abdominal aortic aneurysm) Other   . Stroke Maternal Aunt   . Breast cancer Neg Hx     Social History: Social History   Socioeconomic History  . Marital status: Married    Spouse name: Not on file  . Number of children: Not on file  . Years of education: Not on file  . Highest education level: Not on file  Occupational History  . Not on file  Tobacco Use  . Smoking status: Never Smoker  . Smokeless tobacco: Never Used  Vaping Use  . Vaping Use: Never used  Substance and Sexual Activity  . Alcohol use: No  . Drug use: No  . Sexual activity: Not Currently    Birth control/protection: Post-menopausal  Other Topics Concern  . Not on file  Social History Narrative   Lives in [place] at [place]. From [place]. Has daughter.      Work- Retired Psychologist, prison and probation services Strain:   . Difficulty of Paying Living Expenses: Not on file  Food Insecurity:   . Worried About Charity fundraiser in the Last Year:  Not on file  . Ran Out of Food in the Last Year: Not on file  Transportation Needs:   . Lack of Transportation (Medical): Not on file  . Lack of Transportation (Non-Medical): Not on file  Physical Activity:   . Days of Exercise per Week: Not on file  . Minutes of Exercise per Session: Not on file  Stress:   . Feeling of Stress : Not on file  Social Connections:   . Frequency of Communication with Friends and Family: Not on file  . Frequency of Social Gatherings with Friends and Family: Not on file  . Attends Religious Services: Not on file  . Active Member of Clubs or Organizations: Not on file  . Attends Archivist Meetings: Not on file  . Marital Status: Not on file  Intimate Partner Violence:   . Fear of Current or Ex-Partner: Not on file  . Emotionally Abused: Not on file  . Physically Abused: Not on file  . Sexually Abused: Not on  file    Allergies: Allergies  Allergen Reactions  . Levaquin [Levofloxacin In D5w] Other (See Comments)    hallucinations  . Sulfa Antibiotics     Hallucinations     Current Medications: Current Outpatient Medications  Medication Sig Dispense Refill  . furosemide (LASIX) 20 MG tablet Take 1 tablet (20 mg total) by mouth daily. 90 tablet 2  . latanoprost (XALATAN) 0.005 % ophthalmic solution Place 1 drop into both eyes at bedtime.     Marland Kitchen levothyroxine (EUTHYROX) 88 MCG tablet TAKE 1 TABLET BY MOUTH ONCE DAILY BEFORE BREAKFAST 90 tablet 3  . losartan (COZAAR) 25 MG tablet Take 1 tablet (25 mg total) by mouth daily. 30 tablet 1  . nebivolol (BYSTOLIC) 5 MG tablet Take 1 tablet (5 mg total) by mouth daily. 90 tablet 2  . warfarin (COUMADIN) 1 MG tablet TAKE AS DIRECTED BY MD BASED ON INR 30 tablet 1   No current facility-administered medications for this visit.   Review of Systems General:  fatigue Skin: no complaints Eyes: no complaints HEENT: no complaints Breasts: no complaints Pulmonary: shortness of breath Cardiac: leg swelling Gastrointestinal: abdominal bloating/distention, nausea, diarrhea Genitourinary/Sexual: no complaints Ob/Gyn: vaginal bleeding Musculoskeletal: no complaints Hematology: no complaints Neurologic/Psych: depressed mood   Objective:  Physical Examination:  BP (!) 134/46   Pulse 76   Temp 97.8 F (36.6 C)   Resp 20   Wt 180 lb 12.8 oz (82 kg)   SpO2 98%   BMI 30.09 kg/m    ECOG Performance Status: 2 - Symptomatic, <50% confined to bed  GENERAL: Patient is a well appearing female in no acute distress HEENT:  PERRL, neck supple with midline trachea. Thyroid without masses.   Remainder of exam deferred as this is a treatment discussion visit.   Lab Review Lab Results  Component Value Date   WBC 8.0 03/08/2020   HGB 12.2 03/08/2020   HCT 38.5 03/08/2020   MCV 93.0 03/08/2020   PLT 219 03/08/2020     Chemistry      Component Value  Date/Time   NA 140 03/08/2020 1311   K 4.6 03/08/2020 1311   CL 106 03/08/2020 1311   CO2 28 03/08/2020 1311   BUN 26 (H) 03/08/2020 1311   CREATININE 1.34 (H) 03/08/2020 1311   CREATININE 1.09 12/09/2013 1619      Component Value Date/Time   CALCIUM 8.4 (L) 03/08/2020 1311   ALKPHOS 62 03/08/2020 1311   AST 13 (L)  03/08/2020 1311   ALT 7 03/08/2020 1311   BILITOT 0.4 03/08/2020 1311      Radiologic Imaging: Per hpi     Assessment:  EMBERLEE SORTINO is a 86 y.o. female diagnosed with grade 1 endometrioid endometrial cancer s/p IUD 10/2019 with persistent disease on repeat EMBx  Performance status 2.   Medical co-morbidities complicating care: Frailty; HTN; hypothyroidism; irregular heartbeat; stage III kidney disease (last creatinine 1.36).   Plan:   Problem List Items Addressed This Visit      Genitourinary   Endometrial cancer, grade I ([place]) - Primary     Discussed endometrial biopsy results. We reviewed options including continuing with IUD, starting norethindrone, and radiation therapy. She is worried about norethindrone due to her h/o PE. There is conflicting evidence of risk of VTE with norethindrone and she should be protected given she is on anti-coagulation. Since she has only been on the IUD for ~3 months and her symptoms are improving she would like to continue with the IUD only. We will continue to follow up closely.  Reviewed probability of complete response at 12 months is ~45% based on FEMME trial. Plan to follow up in 3-4 months with EMBx. And she will call us if she has any worsening symptoms.   The patient's diagnosis, an outline of the further diagnostic and laboratory studies which will be required, the recommendation, and alternatives were discussed.  All questions were answered to the patient's satisfaction.  A total of 20 minutes were spent with the patient/family today; >50% was spent in education, counseling and coordination of care for endometrial  cancer.   I personally had a face to face interaction and evaluated the patient independently. Beckey Rutter NP scribed the note.  I have reviewed her history and available records and performed the counseling.  Leona Alen Gaetana Michaelis, MD

## 2020-03-23 ENCOUNTER — Ambulatory Visit: Payer: PPO | Admitting: Obstetrics and Gynecology

## 2020-04-03 ENCOUNTER — Other Ambulatory Visit: Payer: Self-pay | Admitting: Hematology and Oncology

## 2020-04-03 DIAGNOSIS — I2694 Multiple subsegmental pulmonary emboli without acute cor pulmonale: Secondary | ICD-10-CM

## 2020-04-04 ENCOUNTER — Other Ambulatory Visit: Payer: Self-pay

## 2020-04-04 ENCOUNTER — Other Ambulatory Visit (INDEPENDENT_AMBULATORY_CARE_PROVIDER_SITE_OTHER): Payer: PPO

## 2020-04-04 ENCOUNTER — Ambulatory Visit: Payer: PPO

## 2020-04-04 DIAGNOSIS — Z7901 Long term (current) use of anticoagulants: Secondary | ICD-10-CM | POA: Diagnosis not present

## 2020-04-04 LAB — PROTIME-INR
INR: 2.2 ratio — ABNORMAL HIGH (ref 0.8–1.0)
Prothrombin Time: 24.8 s — ABNORMAL HIGH (ref 9.6–13.1)

## 2020-04-11 ENCOUNTER — Telehealth: Payer: Self-pay | Admitting: Family Medicine

## 2020-04-11 NOTE — Telephone Encounter (Signed)
Left message for patient to call back and schedule Medicare Annual Wellness Visit (AWV)   This should be a telephone visit only=30 minutes.  Last AWV 12/21/18; please schedule at anytime with Denisa O'Brien-Blaney at [facility].

## 2020-04-18 ENCOUNTER — Other Ambulatory Visit: Payer: Self-pay

## 2020-04-18 ENCOUNTER — Other Ambulatory Visit (INDEPENDENT_AMBULATORY_CARE_PROVIDER_SITE_OTHER): Payer: PPO

## 2020-04-18 DIAGNOSIS — Z7901 Long term (current) use of anticoagulants: Secondary | ICD-10-CM

## 2020-04-18 LAB — PROTIME-INR
INR: 2.6 ratio — ABNORMAL HIGH (ref 0.8–1.0)
Prothrombin Time: 28.8 s — ABNORMAL HIGH (ref 9.6–13.1)

## 2020-04-24 ENCOUNTER — Encounter: Payer: Self-pay | Admitting: Family Medicine

## 2020-04-24 DIAGNOSIS — I2694 Multiple subsegmental pulmonary emboli without acute cor pulmonale: Secondary | ICD-10-CM

## 2020-05-02 ENCOUNTER — Other Ambulatory Visit (INDEPENDENT_AMBULATORY_CARE_PROVIDER_SITE_OTHER): Payer: PPO

## 2020-05-02 ENCOUNTER — Other Ambulatory Visit: Payer: Self-pay

## 2020-05-02 DIAGNOSIS — Z7901 Long term (current) use of anticoagulants: Secondary | ICD-10-CM | POA: Diagnosis not present

## 2020-05-02 LAB — PROTIME-INR
INR: 2 ratio — ABNORMAL HIGH (ref 0.8–1.0)
Prothrombin Time: 22 s — ABNORMAL HIGH (ref 9.6–13.1)

## 2020-05-03 ENCOUNTER — Other Ambulatory Visit: Payer: Self-pay

## 2020-05-03 DIAGNOSIS — Z7901 Long term (current) use of anticoagulants: Secondary | ICD-10-CM

## 2020-05-04 MED ORDER — WARFARIN SODIUM 1 MG PO TABS: ORAL_TABLET | ORAL | 5 refills | Status: DC

## 2020-05-07 NOTE — Telephone Encounter (Signed)
  Please discuss calcium with patient tomorrow.  M

## 2020-05-16 ENCOUNTER — Other Ambulatory Visit (INDEPENDENT_AMBULATORY_CARE_PROVIDER_SITE_OTHER): Payer: PPO

## 2020-05-16 ENCOUNTER — Other Ambulatory Visit: Payer: Self-pay

## 2020-05-16 DIAGNOSIS — Z7901 Long term (current) use of anticoagulants: Secondary | ICD-10-CM | POA: Diagnosis not present

## 2020-05-16 LAB — PROTIME-INR
INR: 2.1 ratio — ABNORMAL HIGH (ref 0.8–1.0)
Prothrombin Time: 23.8 s — ABNORMAL HIGH (ref 9.6–13.1)

## 2020-05-29 ENCOUNTER — Ambulatory Visit: Payer: PPO | Admitting: Family Medicine

## 2020-05-31 ENCOUNTER — Ambulatory Visit (INDEPENDENT_AMBULATORY_CARE_PROVIDER_SITE_OTHER): Payer: PPO | Admitting: Family Medicine

## 2020-05-31 ENCOUNTER — Encounter: Payer: Self-pay | Admitting: Podiatry

## 2020-05-31 ENCOUNTER — Ambulatory Visit: Payer: PPO | Admitting: Podiatry

## 2020-05-31 ENCOUNTER — Other Ambulatory Visit: Payer: Self-pay

## 2020-05-31 ENCOUNTER — Encounter: Payer: Self-pay | Admitting: Family Medicine

## 2020-05-31 DIAGNOSIS — C541 Malignant neoplasm of endometrium: Secondary | ICD-10-CM

## 2020-05-31 DIAGNOSIS — B351 Tinea unguium: Secondary | ICD-10-CM

## 2020-05-31 DIAGNOSIS — N1831 Chronic kidney disease, stage 3a: Secondary | ICD-10-CM

## 2020-05-31 DIAGNOSIS — I10 Essential (primary) hypertension: Secondary | ICD-10-CM | POA: Diagnosis not present

## 2020-05-31 DIAGNOSIS — D696 Thrombocytopenia, unspecified: Secondary | ICD-10-CM | POA: Diagnosis not present

## 2020-05-31 DIAGNOSIS — F320 Major depressive disorder, single episode, mild: Secondary | ICD-10-CM | POA: Diagnosis not present

## 2020-05-31 DIAGNOSIS — Z86711 Personal history of pulmonary embolism: Secondary | ICD-10-CM | POA: Diagnosis not present

## 2020-05-31 DIAGNOSIS — M79676 Pain in unspecified toe(s): Secondary | ICD-10-CM

## 2020-05-31 LAB — PROTIME-INR
INR: 2.5 ratio — ABNORMAL HIGH (ref 0.8–1.0)
Prothrombin Time: 27.7 s — ABNORMAL HIGH (ref 9.6–13.1)

## 2020-05-31 NOTE — Patient Instructions (Signed)
Nice to see you. If you change your mind regarding medication or therapy for your depression please let us know. We will contact you with your INR result.

## 2020-05-31 NOTE — Progress Notes (Signed)
This patient returns to my office for at risk foot care.  This patient requires this care by a professional since this patient will be at risk due to having history of thrombi and chronic kidney disease. Patient is accompanied to the office with her daughter.  This patient is unable to cut nails herself since the patient cannot reach her nails.These nails are painful walking and wearing shoes.  This patient presents for at risk foot care today.  General Appearance  Alert, conversant and in no acute stress.  Vascular  Dorsalis pedis and posterior tibial  pulses are  Weakly palpable  bilaterally.  Capillary return is within normal limits  bilaterally. Temperature is within normal limits  bilaterally.  Neurologic  Senn-Weinstein monofilament wire test within normal limits  bilaterally. Muscle power within normal limits bilaterally.  Nails Thick disfigured discolored nails with subungual debris  from hallux to fifth toes bilaterally. No evidence of bacterial infection or drainage bilaterally.  Orthopedic  No limitations of motion  feet .  No crepitus or effusions noted.  No bony pathology or digital deformities noted. Amputation fifth toe left.  Contracted fifth toe right foot.  Skin  normotropic skin with no porokeratosis noted bilaterally.  No signs of infections or ulcers noted.     Onychomycosis  Pain in right toes  Pain in left toes  Consent was obtained for treatment procedures.   Mechanical debridement of nails 1-5  Right and 1-4 left.  performed with a nail nipper.  Filed with dremel without incident.    Return office visit   3 months                  Told patient to return for periodic foot care and evaluation due to potential at risk complications.   Koah Chisenhall DPM  

## 2020-05-31 NOTE — Assessment & Plan Note (Signed)
At goal.  Continue losartan 50 mg once daily and Bystolic 5 mg once daily

## 2020-05-31 NOTE — Progress Notes (Signed)
Tommi Rumps, MD Phone: [phone]  Whitney Simmons is a 86 y.o. female who presents today for f/u.  HYPERTENSION  Disease Monitoring  Home BP Monitoring not checking Chest pain- no    Dyspnea- stable, chronic from PE and inactivity Medications  Compliance-  Taking losartan, bystolic, notes she just took her bystolic.   Edema- no  History of pulmonary embolism: Currently on warfarin 1 mg 6 days a week and 2 mg on Mondays.  No bleeding issues.  No missed doses.  She followed up with oncology recently and they released her to follow-up as needed at the patient's request.  Depression: Patient notes this has been going on for years.  She has declined medication in the past.  She notes it is difficult with her husband having dementia.  She prefers to rely on God to help her with this.  She has not talked to a therapist or a Theme park manager.  No SI.    Social History   Tobacco Use  Smoking Status Never Smoker  Smokeless Tobacco Never Used     ROS see history of present illness  Objective  Physical Exam Vitals:   05/31/20 1104  BP: 130/70  Pulse: (!) 53  Temp: 97.8 F (36.6 C)  SpO2: 92%    BP Readings from Last 3 Encounters:  05/31/20 130/70  03/21/20 (!) 134/46  03/08/20 (!) 180/57   Wt Readings from Last 3 Encounters:  05/31/20 188 lb (85.3 kg)  03/21/20 180 lb 12.8 oz (82 kg)  03/08/20 180 lb 3.6 oz (81.8 kg)    Physical Exam Constitutional:      General: She is not in acute distress.    Appearance: She is not diaphoretic.  Cardiovascular:     Rate and Rhythm: Normal rate and regular rhythm.     Heart sounds: Normal heart sounds.  Pulmonary:     Effort: Pulmonary effort is normal.     Breath sounds: Normal breath sounds.  Musculoskeletal:     Right lower leg: No edema.     Left lower leg: No edema.  Skin:    General: Skin is warm and dry.  Neurological:     Mental Status: She is alert.  Psychiatric:     Comments: Mood depressed, intermittently tearful  when talking about her husband      Assessment/Plan: Please see individual problem list.  Problem List Items Addressed This Visit    Depression, major, single episode, mild ([place])    Offered support.  Patient declined medication and therapy.  I encouraged her to increase her activity level as that may provide some benefit for her depression.      Endometrial cancer, grade I ([place])    She is no longer following with oncology.  She will continue to follow with gyn-onc.      History of pulmonary embolism    Asymptomatic.  She is tolerating warfarin.  Check INR today.  She will monitor for bleeding issues.  I encouraged her to increase her activity level.      Relevant Orders   Protime-INR   Hypertension    At goal.  Continue losartan 50 mg once daily and Bystolic 5 mg once daily      Relevant Medications   losartan (COZAAR) 50 MG tablet      This visit occurred during the SARS-CoV-2 public health emergency.  Safety protocols were in place, including screening questions prior to the visit, additional usage of staff PPE, and extensive cleaning of exam room  while observing appropriate contact time as indicated for disinfecting solutions.    Tommi Rumps, MD [place]

## 2020-05-31 NOTE — Assessment & Plan Note (Signed)
Offered support.  Patient declined medication and therapy.  I encouraged her to increase her activity level as that may provide some benefit for her depression.

## 2020-05-31 NOTE — Assessment & Plan Note (Signed)
She is no longer following with oncology.  She will continue to follow with gyn-onc.

## 2020-05-31 NOTE — Assessment & Plan Note (Signed)
Asymptomatic.  She is tolerating warfarin.  Check INR today.  She will monitor for bleeding issues.  I encouraged her to increase her activity level.

## 2020-06-18 ENCOUNTER — Other Ambulatory Visit: Payer: Self-pay | Admitting: Family Medicine

## 2020-06-27 ENCOUNTER — Other Ambulatory Visit: Payer: Self-pay | Admitting: Family Medicine

## 2020-06-27 ENCOUNTER — Other Ambulatory Visit: Payer: Self-pay

## 2020-06-27 ENCOUNTER — Ambulatory Visit: Payer: PPO

## 2020-06-27 DIAGNOSIS — E039 Hypothyroidism, unspecified: Secondary | ICD-10-CM

## 2020-06-28 ENCOUNTER — Other Ambulatory Visit (INDEPENDENT_AMBULATORY_CARE_PROVIDER_SITE_OTHER): Payer: PPO

## 2020-06-28 ENCOUNTER — Other Ambulatory Visit: Payer: Self-pay

## 2020-06-28 DIAGNOSIS — Z7901 Long term (current) use of anticoagulants: Secondary | ICD-10-CM

## 2020-06-28 LAB — PROTIME-INR
INR: 2 ratio — ABNORMAL HIGH (ref 0.8–1.0)
Prothrombin Time: 22.1 s — ABNORMAL HIGH (ref 9.6–13.1)

## 2020-07-04 ENCOUNTER — Ambulatory Visit: Payer: PPO

## 2020-07-11 ENCOUNTER — Inpatient Hospital Stay: Payer: PPO | Attending: Obstetrics and Gynecology | Admitting: Obstetrics and Gynecology

## 2020-07-11 VITALS — BP 160/57 | HR 70 | Temp 97.8°F | Resp 20 | Wt 187.0 lb

## 2020-07-11 DIAGNOSIS — Z86711 Personal history of pulmonary embolism: Secondary | ICD-10-CM | POA: Insufficient documentation

## 2020-07-11 DIAGNOSIS — I129 Hypertensive chronic kidney disease with stage 1 through stage 4 chronic kidney disease, or unspecified chronic kidney disease: Secondary | ICD-10-CM | POA: Diagnosis not present

## 2020-07-11 DIAGNOSIS — F419 Anxiety disorder, unspecified: Secondary | ICD-10-CM | POA: Insufficient documentation

## 2020-07-11 DIAGNOSIS — E785 Hyperlipidemia, unspecified: Secondary | ICD-10-CM | POA: Diagnosis not present

## 2020-07-11 DIAGNOSIS — Z79899 Other long term (current) drug therapy: Secondary | ICD-10-CM | POA: Insufficient documentation

## 2020-07-11 DIAGNOSIS — K589 Irritable bowel syndrome without diarrhea: Secondary | ICD-10-CM | POA: Diagnosis not present

## 2020-07-11 DIAGNOSIS — R54 Age-related physical debility: Secondary | ICD-10-CM | POA: Diagnosis not present

## 2020-07-11 DIAGNOSIS — F320 Major depressive disorder, single episode, mild: Secondary | ICD-10-CM | POA: Diagnosis not present

## 2020-07-11 DIAGNOSIS — Z7901 Long term (current) use of anticoagulants: Secondary | ICD-10-CM | POA: Insufficient documentation

## 2020-07-11 DIAGNOSIS — D7582 Heparin induced thrombocytopenia (HIT): Secondary | ICD-10-CM | POA: Diagnosis not present

## 2020-07-11 DIAGNOSIS — E039 Hypothyroidism, unspecified: Secondary | ICD-10-CM | POA: Insufficient documentation

## 2020-07-11 DIAGNOSIS — C541 Malignant neoplasm of endometrium: Secondary | ICD-10-CM | POA: Diagnosis not present

## 2020-07-11 DIAGNOSIS — N183 Chronic kidney disease, stage 3 unspecified: Secondary | ICD-10-CM | POA: Diagnosis not present

## 2020-07-11 NOTE — Addendum Note (Signed)
Addended by: Gillis Ends on: 07/11/2020 04:38 PM   Modules accepted: Orders

## 2020-07-11 NOTE — Progress Notes (Addendum)
Gynecologic Oncology Interval Visit   Referring Provider: Adrian Prows, MD  Chief Concern: endometrial adenocarcinoma, endometrioid type, FIGO grade 1  Subjective:  Whitney Simmons is a 87 y.o. female who is seen in consultation from Dr. Gilman Schmidt for endometrial adenocarcinoma, endometrioid type, FIGO grade 1, s/p Mirena IUD who returns to clinic today for EMBx and surveillance of endometrial cancer.   She is doing well and has no bleeding or abnormal discharge.     Gynecologic Oncology History:  Whitney Simmons is a pleasant female who is seen in consultation from Dr. Gilman Schmidt for endometrial adenocarcinoma, endometrioid type, FIGO grade 1, s/p Mirena IUD.   She initially presented with postmenopausal bleeding. Work up included ultrasound, D&C, Myosure/Hysteroscopy.  09/05/2019 Pelvic ultrasound Findings:  The uterus is anteverted and measures 8.3 x 5.0 x 3.9 cm. Echo texture is heterogenous without evidence of focal masses. The Endometrium measures 5.2 mm. There is an echogenic mass within the endometrium measuring 22.2 x 15.2 x 21.0 mm. Minimal blood flow is seen within.   Bilateral ovaries are not visualized.  Survey of the adnexa demonstrates no adnexal masses. There is no free fluid in the cul de sac.  Impression: 1. There is an echogenic mass within the endometrial canal.  2. The endometrial lining is slightly thick for a postmenopausal woman. 3. Neither ovary is visualized.    10/18/2019 D&C, Myosure/Hysteroscopy Exam under anesthesia revealed  9 cm uterus with bilateral adnexa without masses or fullness. Hysteroscopy revealed an anterior uterine mass with irregular contour, not consistent with fibroid or polyp. Mass obscured the tubal ostia. Normal appearing endocervical canal.  A. ENDOMETRIUM, CURETTAGE:  - ENDOMETRIAL ADENOCARCINOMA, ENDOMETRIOID TYPE, FIGO GRADE 1.  - BACKGROUND ATYPICAL HYPERPLASIA / EIN.   11/08/19 CT C/A/P WO Contrast did not show evidence  of metastatic disease or other acute findings.   11/09/19 She had IUD placed with Dr. Gilman Schmidt   11/13/2019 After the IUD she was diagnosed with bilateral pulmonary embolism Chest CT: Partially occlusive segmental and subsegmental bilateral pulmonary emboli as described above within the right middle lobe, right lower lobe, left upper lobe and left lower lobes. No evidence of right ventricular heart strain.   03/07/20. EMBx A. ENDOMETRIUM; PIPELLE BIOPSY:  - RESIDUAL ENDOMETRIAL ADENOCARCINOMA.  - BACKGROUND ENDOMETRIUM WITH PROGESTIN EFFECT.  Adding norethindrone was discussed. Due to VTE history she declined. She opted for continued medical management and close surveillance.   She has a history of bilateral subsegmental pulmonary embolism and is currently on coumadin.    She has a strong support system and is often accompanied to clinic with her daughter Whitney Simmons who leaves near [place], [place].      Problem List: Patient Active Problem List   Diagnosis Date Noted  . Depression, major, single episode, mild ([place]) 05/31/2020  . HIT (heparin-induced thrombocytopenia) ([place]) 12/05/2019  . 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 Goals of care, counseling/discussion 12/04/2019  . History of pulmonary embolism 11/13/2019  . IUD (intrauterine device) in place 11/13/2019  . Endometrial cancer, grade I ([place]) 10/25/2019  . Postmenopausal bleeding   . Preoperative exam for gynecologic surgery 10/05/2019  . Vaginal discharge 06/30/2019  . Difficulty hearing 12/06/2018  . Bruising 05/31/2018  . Gait instability 09/22/2017  . Nevus 05/29/2017  . Callus of foot 01/05/2017  . Thrombocytopenia ([place]) 11/06/2015  . Osteoporosis 03/07/2015  . Postmenopausal estrogen deficiency 01/23/2015  . Glaucoma 05/12/2014  . History of breast cancer 02/13/2014  . IBS (irritable bowel syndrome) 12/09/2013  . Palpitations 09/01/2012  .  Chronic kidney disease, stage III (moderate) ([place]) 08/18/2012  .  Anxiety 08/02/2012  . Hypothyroidism 07/30/2012  . Hyperlipidemia 07/30/2012  . Hypertension 07/30/2012    Past Medical History: Past Medical History:  Diagnosis Date  . Bilateral pulmonary embolism ([place]) 10/2019  . Breast microcalcification, mammographic 01/31/2014  . 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 Declined postoperative radiation and antiestrogen therapy.  . Edema   . Hyperlipidemia   . Hypertension   . Hypothyroidism   . IBS (irritable bowel syndrome)   . Irregular heart beat   . stage III kidney disease   . Thyroid disease    s/p radioactive iodine ablation 30 years ago    Past Surgical History: Past Surgical History:  Procedure Laterality Date  . BREAST LUMPECTOMY Left 2015   DUCTAL CARCINOMA IN SITU WITH CALCIFICATIONS  . BREAST SURGERY Left 02-15-14   wide excision  . CATARACT EXTRACTION    . CHOLECYSTECTOMY    . HYSTEROSCOPY WITH D & C N/A 10/18/2019   Procedure: DILATATION AND CURETTAGE /HYSTEROSCOPY;  Surgeon: Homero Fellers, MD;  Location: [facility] ORS;  Service: Gynecology;  Laterality: N/A;  . TOE AMPUTATION     left 5th toe    Past Gynecologic History:  Menarche: 15 Menses regular: She does not remember her menstrual h/o or when she underwent menopause. History of Abnormal pap: No Last pap: Unknown  Mammogram 03/2019  OB History:  OB History  Gravida Para Term Preterm AB Living  2         2  SAB IAB Ectopic Multiple Live Births               # Outcome Date GA Lbr Len/2nd Weight Sex Delivery Anes PTL Lv  2 Gravida           1 Gravida             Family History: Family History  Problem Relation Age of Onset  . Heart disease Mother   . Heart disease Father   . Heart attack Father   . Heart disease Maternal Aunt   . Heart disease Brother   . AAA (abdominal aortic aneurysm) Other   . Stroke Maternal Aunt   . Breast cancer Neg Hx     Social History: Social History   Socioeconomic History   . Marital status: Married    Spouse name: Not on file  . Number of children: Not on file  . Years of education: Not on file  . Highest education level: Not on file  Occupational History  . Not on file  Tobacco Use  . Smoking status: Never Smoker  . Smokeless tobacco: Never Used  Vaping Use  . Vaping Use: Never used  Substance and Sexual Activity  . Alcohol use: No  . Drug use: No  . Sexual activity: Not Currently    Birth control/protection: Post-menopausal  Other Topics Concern  . Not on file  Social History Narrative   Lives in [place]. From [place]. Has daughter.      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 Levaquin [Levofloxacin In D5w] Other (  See Comments)    hallucinations  . Sulfa Antibiotics     Hallucinations     Current Medications: Current Outpatient Medications  Medication Sig Dispense Refill  . furosemide (LASIX) 20 MG tablet TAKE 1 TABLET(20 MG) BY MOUTH DAILY 90 tablet 2  . latanoprost (XALATAN) 0.005 % ophthalmic solution Place 1 drop into both eyes at bedtime.     Marland Kitchen levothyroxine (SYNTHROID) 88 MCG tablet TAKE 1 TABLET BY MOUTH EVERY DAY BEFORE BREAKFAST 90 tablet 3  . losartan (COZAAR) 50 MG tablet Take 50 mg by mouth daily.    . nebivolol (BYSTOLIC) 5 MG tablet Take 1 tablet (5 mg total) by mouth daily. 90 tablet 2  . warfarin (COUMADIN) 1 MG tablet Take Coumadin 2 mg (2 tablets) by mouth on Monday, take Coumadin 1 mg (1 tablet) by mouth on Tuesday, Wednesday, Thursday, Friday, Saturday, and Sunday 34 tablet 5   No current facility-administered medications for this visit.   Review of Systems General: stable weakness and fatigue   HEENT: no complaints  Lungs: stable shortness of breath  Cardiac: no complaints  GI: diarrhea x 1 before her visit. Oether wise negative  GU: no complaints  Musculoskeletal: no complaints  Extremities: bilateral leg swelling  Skin: no complaints  Neuro: no complaints  Endocrine: no complaints  Psych: no complaints       Objective:  Physical Examination:  BP (!) 160/57   Pulse 70   Temp 97.8 F (36.6 C)   Resp 20   Wt 187 lb (84.8 kg)   SpO2 100%   BMI 31.12 kg/m    ECOG Performance Status: 2 - Symptomatic, <50% confined to bed  GENERAL: Frail appearing patient in NAD. Presents to clinic using a walker.  HEENT:  PERRL, atraumatic normocephalic NODES:  No cervical, supraclavicular, axillary, or inguinal lymphadenopathy palpated.  LUNGS:  Clear to auscultation bilaterally.   HEART:  Regular rate and rhythm.  ABDOMEN:  Soft, nontender, nondistended. No ascites, masses, or hernias.  MSK:  She is unable to lay down flat for the exam.  EXTREMITIES:  Bilateral peripheral edema.   NEURO:  Nonfocal. Well oriented.  Appropriate affect.  Pelvic: EGBUS: no lesions Cervix: no lesions, nontender, mobile. IUD strings seen.  Vagina: no lesions, no discharge or bleeding Uterus: normal size ~8-10 cm, nontender, mobile Adnexa: no palpable masses but limited by exam Rectovaginal: deferred  EMBx performed.    The risks and benefits of the procedure were reviewed and informed consent obtained. Time out was performed. The patient received pre-procedure teaching and expressed understanding. The post-procedure instructions were reviewed with the patient and she expressed understanding. The patient does not have any barriers to learning.  Speculum placed in the vaginal vault and cervix identified. Cervix cleansed with Betadine and anterior lip grasped with a tenaculum.The pipelle advanced to 7 cm. The specimen was obtained without difficulty. The tenaculum was removed and hemostasis was  excellent.   Post-procedure evaluation the patient was stable without complaints.   Lab Review Lab Results  Component Value Date   WBC 8.0 03/08/2020   HGB 12.2 03/08/2020   HCT 38.5 03/08/2020   MCV 93.0 03/08/2020   PLT 219 03/08/2020     Chemistry      Component Value Date/Time   NA 140 03/08/2020 1311   K 4.6 03/08/2020 1311   CL 106 03/08/2020 1311   CO2 28 03/08/2020 1311   BUN 26 (H) 03/08/2020 1311   CREATININE 1.34 (H) 03/08/2020 1311   CREATININE  1.09 12/09/2013 1619      Component Value Date/Time   CALCIUM 8.4 (L) 03/08/2020 1311   ALKPHOS 62 03/08/2020 1311   AST 13 (L) 03/08/2020 1311   ALT 7 03/08/2020 1311   BILITOT 0.4 03/08/2020 1311      Radiologic Imaging: Per hpi     Assessment:  FAYTH TREFRY is a 87 y.o. female diagnosed with grade 1 endometrioid endometrial cancer s/p IUD 10/2019 with persistent disease on repeat EMBx  Performance status 2.  Mild azotemia   Medical co-morbidities complicating care: Frailty; HTN; hypothyroidism; irregular heartbeat; stage III kidney disease (last creatinine 1.36).   Plan:   Problem List Items Addressed This Visit      Genitourinary   Endometrial cancer, grade I ([place]) - Primary   Relevant Orders   Surgical pathology     Follow up EMBx and determine next steps. We previously discussed options for persistent endometrial cancer including starting norethindrone, and radiation therapy. We previously discussed probability of complete response at 12 months is ~45% based on FEMME trial. Upon further discussion she really does not want radiation. Her focus is on quality of life and she asked about prognosis and how long it takes the cancer to progress to adjacent structures. She wants to only consider additional treatment if she has symptomatic progressive disease. Plan to follow up in 6 months to ensure IUD strings still in place. We will only obtain EMBx if she has symptoms. And she will call us if she has any  worsening symptoms.   We discussed DNAR and she would like to pursue that. We placed the order today and she was given a Stop Sign. I encouraged her to follow up with her PCP to discuss further.   The patient's diagnosis, an outline of the further diagnostic and laboratory studies which will be required, the recommendation, and alternatives were discussed.  All questions were answered to the patient's satisfaction.  I personally had a face to face interaction and evaluated the patient independently. Beckey Rutter NP scribed the note.  I have reviewed her history and available records and performed the counseling.  Lizet Kelso Gaetana Michaelis, MD

## 2020-07-13 ENCOUNTER — Other Ambulatory Visit: Payer: Self-pay | Admitting: Family Medicine

## 2020-07-13 LAB — SURGICAL PATHOLOGY

## 2020-07-18 ENCOUNTER — Telehealth: Payer: Self-pay | Admitting: Obstetrics and Gynecology

## 2020-07-18 NOTE — Telephone Encounter (Signed)
I called Ms. Dantes and her daughter, [name], to discuss the endometrial biopsy. We discussed adding norethindrone. Norethindrone can increase risk of blood clots, stroke, and heart attack. Ms. Schecter does have a risk of blood clots and she is on coumadin. She is not interested in starting norethindrone b/c of the side effect. She would like to continue to follow up in 26months. As we discussed in clinic, she is not interested in radiation at this time. Plan to continue the IUD and monitor closely.   DIAGNOSIS:  A. ENDOMETRIUM; PIPELLE BIOPSY:  - RESIDUAL ENDOMETRIAL ADENOCARCINOMA.  - BACKGROUND ENDOMETRIUM WITH PROGESTIN EFFECT.  Davielle Lingelbach Gaetana Michaelis, MD

## 2020-07-25 ENCOUNTER — Other Ambulatory Visit (INDEPENDENT_AMBULATORY_CARE_PROVIDER_SITE_OTHER): Payer: PPO

## 2020-07-25 ENCOUNTER — Other Ambulatory Visit: Payer: Self-pay

## 2020-07-25 DIAGNOSIS — Z7901 Long term (current) use of anticoagulants: Secondary | ICD-10-CM | POA: Diagnosis not present

## 2020-07-25 LAB — PROTIME-INR
INR: 2.1 ratio — ABNORMAL HIGH (ref 0.8–1.0)
Prothrombin Time: 23.8 s — ABNORMAL HIGH (ref 9.6–13.1)

## 2020-08-02 DIAGNOSIS — H903 Sensorineural hearing loss, bilateral: Secondary | ICD-10-CM | POA: Diagnosis not present

## 2020-08-13 ENCOUNTER — Ambulatory Visit: Payer: PPO

## 2020-08-22 ENCOUNTER — Other Ambulatory Visit (INDEPENDENT_AMBULATORY_CARE_PROVIDER_SITE_OTHER): Payer: PPO

## 2020-08-22 ENCOUNTER — Other Ambulatory Visit: Payer: Self-pay

## 2020-08-22 DIAGNOSIS — Z7901 Long term (current) use of anticoagulants: Secondary | ICD-10-CM | POA: Diagnosis not present

## 2020-08-22 LAB — PROTIME-INR
INR: 3 ratio — ABNORMAL HIGH (ref 0.8–1.0)
Prothrombin Time: 32.8 s — ABNORMAL HIGH (ref 9.6–13.1)

## 2020-08-24 ENCOUNTER — Other Ambulatory Visit: Payer: PPO

## 2020-08-27 ENCOUNTER — Other Ambulatory Visit: Payer: Self-pay | Admitting: Family Medicine

## 2020-08-27 DIAGNOSIS — I10 Essential (primary) hypertension: Secondary | ICD-10-CM

## 2020-08-27 DIAGNOSIS — Z7901 Long term (current) use of anticoagulants: Secondary | ICD-10-CM

## 2020-08-29 ENCOUNTER — Ambulatory Visit: Payer: PPO | Admitting: Family Medicine

## 2020-08-30 ENCOUNTER — Other Ambulatory Visit: Payer: Self-pay

## 2020-08-31 ENCOUNTER — Other Ambulatory Visit (INDEPENDENT_AMBULATORY_CARE_PROVIDER_SITE_OTHER): Payer: PPO

## 2020-08-31 ENCOUNTER — Other Ambulatory Visit: Payer: Self-pay

## 2020-08-31 ENCOUNTER — Telehealth: Payer: Self-pay | Admitting: *Deleted

## 2020-08-31 DIAGNOSIS — D696 Thrombocytopenia, unspecified: Secondary | ICD-10-CM

## 2020-08-31 DIAGNOSIS — Z7901 Long term (current) use of anticoagulants: Secondary | ICD-10-CM

## 2020-08-31 DIAGNOSIS — I10 Essential (primary) hypertension: Secondary | ICD-10-CM

## 2020-08-31 LAB — BASIC METABOLIC PANEL
BUN: 21 mg/dL (ref 6–23)
CO2: 26 mEq/L (ref 19–32)
Calcium: 7.9 mg/dL — ABNORMAL LOW (ref 8.4–10.5)
Chloride: 105 mEq/L (ref 96–112)
Creatinine, Ser: 1.36 mg/dL — ABNORMAL HIGH (ref 0.40–1.20)
GFR: 34.24 mL/min — ABNORMAL LOW (ref >60.00)
Glucose, Bld: 147 mg/dL — ABNORMAL HIGH (ref 70–99)
Potassium: 4.3 mEq/L (ref 3.5–5.1)
Sodium: 138 mEq/L (ref 135–145)

## 2020-08-31 LAB — PROTIME-INR
INR: 2.4 ratio — ABNORMAL HIGH (ref 0.8–1.0)
Prothrombin Time: 26.4 s — ABNORMAL HIGH (ref 9.6–13.1)

## 2020-08-31 NOTE — Progress Notes (Signed)
Pt asked for [place] to draw a CBC. It can not be ran due to her history of "clumping". So if she wants it, we will need to collect a blue top and lavender top when drawing a CBC.

## 2020-08-31 NOTE — Addendum Note (Signed)
Addended by: Leeanne Rio on: 08/31/2020 03:34 PM   Modules accepted: Orders

## 2020-09-03 ENCOUNTER — Encounter: Payer: Self-pay | Admitting: Podiatry

## 2020-09-03 ENCOUNTER — Ambulatory Visit (INDEPENDENT_AMBULATORY_CARE_PROVIDER_SITE_OTHER): Payer: PPO | Admitting: Podiatry

## 2020-09-03 ENCOUNTER — Encounter: Payer: Self-pay | Admitting: Family Medicine

## 2020-09-03 ENCOUNTER — Ambulatory Visit (INDEPENDENT_AMBULATORY_CARE_PROVIDER_SITE_OTHER): Payer: PPO | Admitting: Family Medicine

## 2020-09-03 ENCOUNTER — Other Ambulatory Visit: Payer: Self-pay

## 2020-09-03 DIAGNOSIS — I82812 Embolism and thrombosis of superficial veins of left lower extremities: Secondary | ICD-10-CM | POA: Diagnosis not present

## 2020-09-03 DIAGNOSIS — R600 Localized edema: Secondary | ICD-10-CM

## 2020-09-03 DIAGNOSIS — E039 Hypothyroidism, unspecified: Secondary | ICD-10-CM | POA: Diagnosis not present

## 2020-09-03 DIAGNOSIS — Z86711 Personal history of pulmonary embolism: Secondary | ICD-10-CM | POA: Diagnosis not present

## 2020-09-03 DIAGNOSIS — B351 Tinea unguium: Secondary | ICD-10-CM

## 2020-09-03 DIAGNOSIS — N1832 Chronic kidney disease, stage 3b: Secondary | ICD-10-CM

## 2020-09-03 DIAGNOSIS — M79676 Pain in unspecified toe(s): Secondary | ICD-10-CM | POA: Diagnosis not present

## 2020-09-03 DIAGNOSIS — D696 Thrombocytopenia, unspecified: Secondary | ICD-10-CM

## 2020-09-03 DIAGNOSIS — N1831 Chronic kidney disease, stage 3a: Secondary | ICD-10-CM | POA: Diagnosis not present

## 2020-09-03 LAB — POCT URINALYSIS DIPSTICK
Bilirubin, UA: NEGATIVE
Glucose, UA: NEGATIVE
Ketones, UA: NEGATIVE
Leukocytes, UA: NEGATIVE
Nitrite, UA: NEGATIVE
Protein, UA: NEGATIVE
Spec Grav, UA: 1.02 (ref 1.010–1.025)
Urobilinogen, UA: 0.2 E.U./dL
pH, UA: 5.5 (ref 5.0–8.0)

## 2020-09-03 NOTE — Assessment & Plan Note (Signed)
Continue Synthroid.  Plan for TSH check at next visit.

## 2020-09-03 NOTE — Assessment & Plan Note (Signed)
Stable.  Discussed 40 ounces of fluid daily.  She will monitor her swelling and if it increases with this increase in fluid intake she will decrease her fluid intake.  She did have some protein on her urinalysis in the ED previously.  We will recheck this today.

## 2020-09-03 NOTE — Progress Notes (Signed)
Tommi Rumps, MD Phone: [phone]  Whitney Simmons is a 87 y.o. female who presents today for f/u.  HYPOTHYROIDISM Disease Monitoring Weight changes: stable  Skin Changes: no  Heat/Cold intolerance: no changes to chronic cold intolerance  Medication Monitoring Compliance:  Taking synthroid   Last TSH:   Lab Results  Component Value Date   TSH 1.02 10/05/2019   CKD stage III: Kidney function has been relatively stable.  She only drinks about 20 ounces of water a day.  History of PE: Continues on Coumadin.  INR has been acceptable.  She has no chest pain.  No change her chronic dyspnea on exertion.  She does occasionally have bilateral leg swelling though no unilateral leg swelling.  Her leg swelling resolves with appropriate use of her torsemide.  Social History   Tobacco Use  Smoking Status Never Smoker  Smokeless Tobacco Never Used    Current Outpatient Medications on File Prior to Visit  Medication Sig Dispense Refill  . BYSTOLIC 5 MG tablet TAKE 1 TABLET(5 MG) BY MOUTH DAILY 90 tablet 2  . furosemide (LASIX) 20 MG tablet TAKE 1 TABLET(20 MG) BY MOUTH DAILY 90 tablet 2  . latanoprost (XALATAN) 0.005 % ophthalmic solution Place 1 drop into both eyes at bedtime.     Marland Kitchen levothyroxine (SYNTHROID) 88 MCG tablet TAKE 1 TABLET BY MOUTH EVERY DAY BEFORE BREAKFAST 90 tablet 3  . warfarin (COUMADIN) 1 MG tablet Take Coumadin 2 mg (2 tablets) by mouth on Monday, take Coumadin 1 mg (1 tablet) by mouth on Tuesday, Wednesday, Thursday, Friday, Saturday, and Sunday 34 tablet 5  . losartan (COZAAR) 50 MG tablet Take 50 mg by mouth daily.     No current facility-administered medications on file prior to visit.     ROS see history of present illness  Objective  Physical Exam Vitals:   09/03/20 1456  BP: 118/80  Pulse: 65  Temp: 98 F (36.7 C)  SpO2: 97%    BP Readings from Last 3 Encounters:  09/03/20 118/80  07/11/20 (!) 160/57  05/31/20 130/70   Wt Readings from  Last 3 Encounters:  09/03/20 191 lb 6.4 oz (86.8 kg)  07/11/20 187 lb (84.8 kg)  05/31/20 188 lb (85.3 kg)    Physical Exam Constitutional:      General: She is not in acute distress.    Appearance: She is not diaphoretic.  Cardiovascular:     Rate and Rhythm: Normal rate and regular rhythm.     Heart sounds: Normal heart sounds.  Pulmonary:     Effort: Pulmonary effort is normal.     Breath sounds: Normal breath sounds.  Musculoskeletal:        General: No edema.     Comments: 1+ pitting edema bilaterally  Skin:    General: Skin is warm and dry.  Neurological:     Mental Status: She is alert.      Assessment/Plan: Please see individual problem list.  Problem List Items Addressed This Visit    Chronic kidney disease, stage III (moderate) ([facility])    Stable.  Discussed 40 ounces of fluid daily.  She will monitor her swelling and if it increases with this increase in fluid intake she will decrease her fluid intake.  She did have some protein on her urinalysis in the ED previously.  We will recheck this today.      Relevant Orders   POCT Urinalysis Dipstick (Completed)   Urine Microscopic (Completed)   History of pulmonary embolism  Stable.  She will continue on Coumadin at her current regimen.  Recheck in 4 weeks.      Hypothyroidism    Continue Synthroid.  Plan for TSH check at next visit.      Lower extremity edema    Likely venous insufficiency though her kidney function could be playing a role as well.  Prior echo with no significant systolic heart failure.  Discussed propping her legs up.  Torsemide seems to be beneficial so she can continue this.  We will continue to monitor her kidney function.          This visit occurred during the SARS-CoV-2 public health emergency.  Safety protocols were in place, including screening questions prior to the visit, additional usage of staff PPE, and extensive cleaning of exam room while observing appropriate contact time as  indicated for disinfecting solutions.    Tommi Rumps, MD [place]

## 2020-09-03 NOTE — Assessment & Plan Note (Signed)
Stable.  She will continue on Coumadin at her current regimen.  Recheck in 4 weeks.

## 2020-09-03 NOTE — Telephone Encounter (Signed)
Opened in error

## 2020-09-03 NOTE — Assessment & Plan Note (Addendum)
Likely venous insufficiency though her kidney function could be playing a role as well.  Prior echo with no significant systolic heart failure.  Discussed propping her legs up.  Torsemide seems to be beneficial so she can continue this.  We will continue to monitor her kidney function.

## 2020-09-03 NOTE — Progress Notes (Signed)
This patient returns to my office for at risk foot care.  This patient requires this care by a professional since this patient will be at risk due to having history of thrombi and chronic kidney disease. Patient is accompanied to the office with her daughter.  This patient is unable to cut nails herself since the patient cannot reach her nails.These nails are painful walking and wearing shoes.  This patient presents for at risk foot care today.  General Appearance  Alert, conversant and in no acute stress.  Vascular  Dorsalis pedis and posterior tibial  pulses are  Weakly palpable  bilaterally.  Capillary return is within normal limits  bilaterally. Temperature is within normal limits  bilaterally.  Neurologic  Senn-Weinstein monofilament wire test within normal limits  bilaterally. Muscle power within normal limits bilaterally.  Nails Thick disfigured discolored nails with subungual debris  from hallux to fifth toes bilaterally. No evidence of bacterial infection or drainage bilaterally.  Orthopedic  No limitations of motion  feet .  No crepitus or effusions noted.  No bony pathology or digital deformities noted. Amputation fifth toe left.  Contracted fifth toe right foot.  Skin  normotropic skin with no porokeratosis noted bilaterally.  No signs of infections or ulcers noted.     Onychomycosis  Pain in right toes  Pain in left toes  Consent was obtained for treatment procedures.   Mechanical debridement of nails 1-5  Right and 1-4 left.  performed with a nail nipper.  Filed with dremel without incident.    Return office visit   3 months                  Told patient to return for periodic foot care and evaluation due to potential at risk complications.   Gardiner Barefoot DPM

## 2020-09-03 NOTE — Patient Instructions (Signed)
Nice to see you. We will let you know what your urine shows. Please monitor your swelling and if it worsens at any point please let us know. Please try to get 40 ounces of water daily. Please continue with your current Coumadin regimen.

## 2020-09-04 LAB — URINALYSIS, MICROSCOPIC ONLY

## 2020-09-26 ENCOUNTER — Other Ambulatory Visit: Payer: Self-pay

## 2020-09-26 ENCOUNTER — Other Ambulatory Visit (INDEPENDENT_AMBULATORY_CARE_PROVIDER_SITE_OTHER): Payer: PPO

## 2020-09-26 ENCOUNTER — Other Ambulatory Visit: Payer: Self-pay | Admitting: Family Medicine

## 2020-09-26 DIAGNOSIS — D696 Thrombocytopenia, unspecified: Secondary | ICD-10-CM

## 2020-09-26 DIAGNOSIS — I2694 Multiple subsegmental pulmonary emboli without acute cor pulmonale: Secondary | ICD-10-CM

## 2020-09-26 DIAGNOSIS — Z7901 Long term (current) use of anticoagulants: Secondary | ICD-10-CM | POA: Diagnosis not present

## 2020-09-26 LAB — CBC WITH DIFFERENTIAL/PLATELET
Basophils Relative: 1.1 % (ref 0.0–3.0)
Eosinophils Relative: 2.8 % (ref 0.0–5.0)
HCT: 37.8 % (ref 36.0–46.0)
Hemoglobin: 12.2 g/dL (ref 12.0–15.0)
Lymphocytes Relative: 14 % (ref 12.0–46.0)
MCHC: 32.3 g/dL (ref 30.0–36.0)
MCV: 91 fl (ref 78.0–100.0)
Monocytes Relative: 9.3 % (ref 3.0–12.0)
Neutrophils Relative %: 72.8 % (ref 43.0–77.0)
Platelets: 189.2 10*3/uL (ref 150.0–400.0)
RBC: 4.16 Mil/uL (ref 3.87–5.11)
RDW: 14.3 % (ref 11.5–15.5)
WBC: 7.5 10*3/uL (ref 4.0–10.5)

## 2020-09-26 LAB — PROTIME-INR
INR: 2.4 ratio — ABNORMAL HIGH (ref 0.8–1.0)
Prothrombin Time: 26.5 s — ABNORMAL HIGH (ref 9.6–13.1)

## 2020-10-10 ENCOUNTER — Ambulatory Visit: Payer: PPO

## 2020-10-24 ENCOUNTER — Other Ambulatory Visit: Payer: Self-pay

## 2020-10-24 ENCOUNTER — Other Ambulatory Visit (INDEPENDENT_AMBULATORY_CARE_PROVIDER_SITE_OTHER): Payer: PPO

## 2020-10-24 DIAGNOSIS — Z7901 Long term (current) use of anticoagulants: Secondary | ICD-10-CM | POA: Diagnosis not present

## 2020-10-24 LAB — PROTIME-INR
INR: 2.4 ratio — ABNORMAL HIGH (ref 0.8–1.0)
Prothrombin Time: 26.4 s — ABNORMAL HIGH (ref 9.6–13.1)

## 2020-10-26 ENCOUNTER — Telehealth: Payer: Self-pay

## 2020-10-26 NOTE — Telephone Encounter (Signed)
LVM informing the patient to call back and ask for Diego Delancey,cma

## 2020-10-26 NOTE — Telephone Encounter (Signed)
-----   Message from Leone Haven, MD sent at 10/26/2020  1:02 PM EDT ----- Noted.  She should continue with the 25 mg of losartan.  Please update this in her chart.  If needed we can adjust this based on the blood pressures that they send.

## 2020-10-31 ENCOUNTER — Encounter: Payer: Self-pay | Admitting: Family Medicine

## 2020-10-31 ENCOUNTER — Other Ambulatory Visit: Payer: Self-pay

## 2020-10-31 DIAGNOSIS — I10 Essential (primary) hypertension: Secondary | ICD-10-CM

## 2020-10-31 MED ORDER — LOSARTAN POTASSIUM 25 MG PO TABS: 25.0000 mg | ORAL_TABLET | Freq: Every day | ORAL | 0 refills | Status: DC

## 2020-11-02 ENCOUNTER — Other Ambulatory Visit: Payer: Self-pay | Admitting: Family Medicine

## 2020-11-17 ENCOUNTER — Encounter: Payer: Self-pay | Admitting: Family Medicine

## 2020-11-19 ENCOUNTER — Telehealth: Payer: Self-pay | Admitting: Family Medicine

## 2020-11-19 NOTE — Telephone Encounter (Signed)
Patient called and wanted to know what Dr Caryl Bis thought about 2nd covid booster shot. During the phone call she decided get the booster on Wednesday, 11/21/20. If Dr Caryl Bis feels that she should not get the 2nd booster please call before Wednesday. Thank you.

## 2020-11-20 NOTE — Telephone Encounter (Signed)
Agree with the message sent by NP.

## 2020-11-21 ENCOUNTER — Other Ambulatory Visit (INDEPENDENT_AMBULATORY_CARE_PROVIDER_SITE_OTHER): Payer: PPO

## 2020-11-21 ENCOUNTER — Other Ambulatory Visit: Payer: Self-pay

## 2020-11-21 DIAGNOSIS — I2699 Other pulmonary embolism without acute cor pulmonale: Secondary | ICD-10-CM

## 2020-11-21 LAB — PROTIME-INR
INR: 2.8 ratio — ABNORMAL HIGH (ref 0.8–1.0)
Prothrombin Time: 30.9 s — ABNORMAL HIGH (ref 9.6–13.1)

## 2020-12-05 ENCOUNTER — Other Ambulatory Visit: Payer: Self-pay

## 2020-12-05 ENCOUNTER — Encounter: Payer: Self-pay | Admitting: Family Medicine

## 2020-12-05 ENCOUNTER — Ambulatory Visit (INDEPENDENT_AMBULATORY_CARE_PROVIDER_SITE_OTHER): Payer: PPO | Admitting: Family Medicine

## 2020-12-05 DIAGNOSIS — H6123 Impacted cerumen, bilateral: Secondary | ICD-10-CM | POA: Diagnosis not present

## 2020-12-05 DIAGNOSIS — N1832 Chronic kidney disease, stage 3b: Secondary | ICD-10-CM

## 2020-12-05 DIAGNOSIS — Z86711 Personal history of pulmonary embolism: Secondary | ICD-10-CM

## 2020-12-05 DIAGNOSIS — H903 Sensorineural hearing loss, bilateral: Secondary | ICD-10-CM | POA: Diagnosis not present

## 2020-12-05 DIAGNOSIS — I10 Essential (primary) hypertension: Secondary | ICD-10-CM | POA: Diagnosis not present

## 2020-12-05 DIAGNOSIS — C541 Malignant neoplasm of endometrium: Secondary | ICD-10-CM

## 2020-12-05 LAB — BASIC METABOLIC PANEL
BUN: 22 mg/dL (ref 6–23)
CO2: 27 mEq/L (ref 19–32)
Calcium: 8.6 mg/dL (ref 8.4–10.5)
Chloride: 104 mEq/L (ref 96–112)
Creatinine, Ser: 1.44 mg/dL — ABNORMAL HIGH (ref 0.40–1.20)
GFR: 31.91 mL/min — ABNORMAL LOW (ref >60.00)
Glucose, Bld: 127 mg/dL — ABNORMAL HIGH (ref 70–99)
Potassium: 4 mEq/L (ref 3.5–5.1)
Sodium: 140 mEq/L (ref 135–145)

## 2020-12-05 NOTE — Assessment & Plan Note (Signed)
I have encouraged her to contact her gynecologist to let them know about the return of her discharge.  She will keep her appointment in 1 month.

## 2020-12-05 NOTE — Assessment & Plan Note (Signed)
Well-controlled.  She will continue losartan 25 mg once daily, Bystolic 5 mg daily, and Lasix 20 mg daily.

## 2020-12-05 NOTE — Patient Instructions (Signed)
Nice to see you. We will check your kidney function today. Please try to get 40 ounces of water daily. Please contact your gynecologist to let them know about the discharge.

## 2020-12-05 NOTE — Progress Notes (Signed)
Tommi Rumps, MD Phone: [phone]  Whitney Simmons is a 87 y.o. female who presents today for f/u.  HYPERTENSION  Disease Monitoring  Home BP Monitoring not checking Chest pain- no    Dyspnea- no Medications  Compliance-  Taking losartan, lasix, bystolic.   Edema- chronic and generally stable   History of PE: Patient has been on Coumadin with adequate INRs.  She is currently taking Coumadin 2 mg on Monday and 1 mg the rest week.  No chest pain or shortness of breath.  Decreased urine output: Patient reports she does not feel as though she is urinating as much as she should.  She goes 3-4 times a day and sometimes is light yellow and other times it is dark yellow.  She thinks she is not drinking enough water.  Endometrial cancer: She has an IUD in place.  She reports recent return of minimal discharge.  She sees GYN in July.  She notes she would not consider any additional treatment for this at her age.    Social History   Tobacco Use  Smoking Status Never Smoker  Smokeless Tobacco Never Used    Current Outpatient Medications on File Prior to Visit  Medication Sig Dispense Refill  . BYSTOLIC 5 MG tablet TAKE 1 TABLET(5 MG) BY MOUTH DAILY 90 tablet 2  . furosemide (LASIX) 20 MG tablet TAKE 1 TABLET(20 MG) BY MOUTH DAILY 90 tablet 2  . latanoprost (XALATAN) 0.005 % ophthalmic solution Place 1 drop into both eyes at bedtime.     Marland Kitchen levothyroxine (SYNTHROID) 88 MCG tablet TAKE 1 TABLET BY MOUTH EVERY DAY BEFORE BREAKFAST 90 tablet 3  . losartan (COZAAR) 25 MG tablet Take 1 tablet (25 mg total) by mouth daily. 90 tablet 0  . warfarin (COUMADIN) 1 MG tablet TAKE 2 TABLETS BY MOUTH ON MONDAY AND THEN TAKE 1 TABLET ON DAYS TUES,WED,THURS,FRI,SAT,SUN 34 tablet 5   No current facility-administered medications on file prior to visit.     ROS see history of present illness  Objective  Physical Exam Vitals:   12/05/20 1426  BP: 130/78  Pulse: 72  Temp: (!) 97.5 F (36.4 C)   SpO2: 95%    BP Readings from Last 3 Encounters:  12/05/20 130/78  09/03/20 118/80  07/11/20 (!) 160/57   Wt Readings from Last 3 Encounters:  12/05/20 186 lb 9.6 oz (84.6 kg)  09/03/20 191 lb 6.4 oz (86.8 kg)  07/11/20 187 lb (84.8 kg)    Physical Exam Constitutional:      General: She is not in acute distress.    Appearance: She is not diaphoretic.  Cardiovascular:     Rate and Rhythm: Normal rate and regular rhythm.     Heart sounds: Normal heart sounds.  Pulmonary:     Effort: Pulmonary effort is normal.     Breath sounds: Normal breath sounds.  Skin:    General: Skin is warm and dry.  Neurological:     Mental Status: She is alert.      Assessment/Plan: Please see individual problem list.  Problem List Items Addressed This Visit    Hypertension    Well-controlled.  She will continue losartan 25 mg once daily, Bystolic 5 mg daily, and Lasix 20 mg daily.      Chronic kidney disease, stage III (moderate) (HCC)    She reports some decreased urine output recently though it seems as though she may be dehydrated.  I encouraged her to get 40 ounces of fluid daily.  She will monitor.  We will check renal function today.      Relevant Orders   Basic Metabolic Panel (BMET)   Endometrial cancer, grade I ([place])    I have encouraged her to contact her gynecologist to let them know about the return of her discharge.  She will keep her appointment in 1 month.      History of pulmonary embolism    Asymptomatic.  Her INR has been acceptable.  She will continue her current Coumadin regimen of 2 mg on Monday and 4 mg rest the week.  She will have an INR monthly.         Return in about 3 months (around 03/07/2021) for Follow-up history PE and hypertension.  This visit occurred during the SARS-CoV-2 public health emergency.  Safety protocols were in place, including screening questions prior to the visit, additional usage of staff PPE, and extensive cleaning of exam room while  observing appropriate contact time as indicated for disinfecting solutions.    Tommi Rumps, MD [place]

## 2020-12-05 NOTE — Assessment & Plan Note (Signed)
Asymptomatic.  Her INR has been acceptable.  She will continue her current Coumadin regimen of 2 mg on Monday and 4 mg rest the week.  She will have an INR monthly.

## 2020-12-05 NOTE — Assessment & Plan Note (Signed)
She reports some decreased urine output recently though it seems as though she may be dehydrated.  I encouraged her to get 40 ounces of fluid daily.  She will monitor.  We will check renal function today.

## 2020-12-06 ENCOUNTER — Ambulatory Visit (INDEPENDENT_AMBULATORY_CARE_PROVIDER_SITE_OTHER): Payer: PPO | Admitting: Podiatry

## 2020-12-06 ENCOUNTER — Encounter: Payer: Self-pay | Admitting: Podiatry

## 2020-12-06 DIAGNOSIS — M79676 Pain in unspecified toe(s): Secondary | ICD-10-CM

## 2020-12-06 DIAGNOSIS — B351 Tinea unguium: Secondary | ICD-10-CM

## 2020-12-06 DIAGNOSIS — D696 Thrombocytopenia, unspecified: Secondary | ICD-10-CM

## 2020-12-06 DIAGNOSIS — L84 Corns and callosities: Secondary | ICD-10-CM | POA: Diagnosis not present

## 2020-12-06 DIAGNOSIS — N1831 Chronic kidney disease, stage 3a: Secondary | ICD-10-CM | POA: Diagnosis not present

## 2020-12-06 NOTE — Progress Notes (Signed)
This patient returns to my office for at risk foot care.  This patient requires this care by a professional since this patient will be at risk due to having history of thrombi and chronic kidney disease.  This patient is unable to cut nails herself since the patient cannot reach her nails.These nails are painful walking and wearing shoes. Patient has painful corn third toe left foot. This patient presents for at risk foot care today.  General Appearance  Alert, conversant and in no acute stress.  Vascular  Dorsalis pedis and posterior tibial  pulses are  Weakly palpable  bilaterally.  Capillary return is within normal limits  bilaterally. Temperature is within normal limits  bilaterally.  Neurologic  Senn-Weinstein monofilament wire test within normal limits  bilaterally. Muscle power within normal limits bilaterally.  Nails Thick disfigured discolored nails with subungual debris  from hallux to fifth toes bilaterally. No evidence of bacterial infection or drainage bilaterally.  Orthopedic  No limitations of motion  feet .  No crepitus or effusions noted.  No bony pathology or digital deformities noted. Amputation second toe left.  Contracted fifth toe right foot.  Skin  normotropic skin with no porokeratosis noted bilaterally.  No signs of infections or ulcers noted.     Onychomycosis  Pain in right toes  Pain in left toes  Consent was obtained for treatment procedures.   Mechanical debridement of nails 1-5  Right and 1, 3-5 left.  performed with a nail nipper.  Filed with dremel without incident. Debride clavi third toe left foot.   Return office visit   3 months                  Told patient to return for periodic foot care and evaluation due to potential at risk complications.   Gardiner Barefoot DPM

## 2020-12-20 ENCOUNTER — Other Ambulatory Visit: Payer: PPO

## 2020-12-24 ENCOUNTER — Telehealth: Payer: Self-pay | Admitting: *Deleted

## 2020-12-24 DIAGNOSIS — Z7901 Long term (current) use of anticoagulants: Secondary | ICD-10-CM

## 2020-12-24 NOTE — Telephone Encounter (Signed)
Please place future orders for lab appt.  

## 2020-12-25 NOTE — Telephone Encounter (Signed)
Ordered

## 2020-12-26 ENCOUNTER — Other Ambulatory Visit: Payer: Self-pay

## 2020-12-26 ENCOUNTER — Other Ambulatory Visit (INDEPENDENT_AMBULATORY_CARE_PROVIDER_SITE_OTHER): Payer: PPO

## 2020-12-26 DIAGNOSIS — Z7901 Long term (current) use of anticoagulants: Secondary | ICD-10-CM

## 2020-12-26 LAB — PROTIME-INR
INR: 3.3 ratio — ABNORMAL HIGH (ref 0.8–1.0)
Prothrombin Time: 36.5 s — ABNORMAL HIGH (ref 9.6–13.1)

## 2020-12-27 ENCOUNTER — Telehealth: Payer: Self-pay | Admitting: Family Medicine

## 2020-12-27 DIAGNOSIS — I2694 Multiple subsegmental pulmonary emboli without acute cor pulmonale: Secondary | ICD-10-CM

## 2020-12-27 DIAGNOSIS — Z7901 Long term (current) use of anticoagulants: Secondary | ICD-10-CM

## 2020-12-27 MED ORDER — WARFARIN SODIUM 1 MG PO TABS: 1.0000 mg | ORAL_TABLET | Freq: Every day | ORAL | 5 refills | Status: DC

## 2020-12-27 NOTE — Telephone Encounter (Signed)
Noted.  Agree with advice given.  New prescription for warfarin approved.

## 2020-12-27 NOTE — Addendum Note (Signed)
Addended by: Leone Haven on: 12/27/2020 08:39 PM   Modules accepted: Orders

## 2020-12-27 NOTE — Telephone Encounter (Signed)
Patient called and stated that her heart is skipping beats. After speaking to Gae Bon  about her INR, and also being alone. Dr Caryl Bis advised to have patient triaged. Patient was transferred to [facility] at Access Nurse.

## 2020-12-27 NOTE — Telephone Encounter (Signed)
Patient says she does not need triage she just wanted to relay a message to Dr. Caryl Bis, That she feels like she is upset about her INR , patient advised she is taking 2 mg Coumadin on Monday and 1 mg all other days, and being upset is causing her to have palpitations again because she is anxious.  Relayed to PCP received verbal for 1 mg coumadin daily and repeat INR in one week. Advised patient that if she is having racing heart or chest pain needs to be evaluated , patient stated no that she calms down this will stop . I advised again if racing heart , skipping beat s needs to be seen patient refused stating if it gets worse or does not stop she will o.

## 2021-01-02 ENCOUNTER — Telehealth: Payer: Self-pay | Admitting: Nurse Practitioner

## 2021-01-02 NOTE — Telephone Encounter (Signed)
Called patient to see if she wanted to move up appt to today to see Dr. Theora Gianotti. Otherwise, she can remain as scheduled and see Dr. Fransisca Connors next week. No answer. VM left.

## 2021-01-03 ENCOUNTER — Other Ambulatory Visit: Payer: Self-pay

## 2021-01-03 ENCOUNTER — Other Ambulatory Visit (INDEPENDENT_AMBULATORY_CARE_PROVIDER_SITE_OTHER): Payer: PPO

## 2021-01-03 DIAGNOSIS — Z7901 Long term (current) use of anticoagulants: Secondary | ICD-10-CM | POA: Diagnosis not present

## 2021-01-03 LAB — PROTIME-INR
INR: 2.9 ratio — ABNORMAL HIGH (ref 0.8–1.0)
Prothrombin Time: 31.8 s — ABNORMAL HIGH (ref 9.6–13.1)

## 2021-01-09 ENCOUNTER — Encounter: Payer: Self-pay | Admitting: Obstetrics and Gynecology

## 2021-01-09 ENCOUNTER — Inpatient Hospital Stay: Payer: PPO | Attending: Obstetrics and Gynecology | Admitting: Obstetrics and Gynecology

## 2021-01-09 VITALS — BP 158/63 | HR 74 | Temp 97.8°F | Resp 20 | Wt 187.2 lb

## 2021-01-09 DIAGNOSIS — Z975 Presence of (intrauterine) contraceptive device: Secondary | ICD-10-CM | POA: Insufficient documentation

## 2021-01-09 DIAGNOSIS — R350 Frequency of micturition: Secondary | ICD-10-CM

## 2021-01-09 DIAGNOSIS — C541 Malignant neoplasm of endometrium: Secondary | ICD-10-CM | POA: Diagnosis not present

## 2021-01-09 DIAGNOSIS — M810 Age-related osteoporosis without current pathological fracture: Secondary | ICD-10-CM | POA: Insufficient documentation

## 2021-01-09 DIAGNOSIS — R7989 Other specified abnormal findings of blood chemistry: Secondary | ICD-10-CM | POA: Diagnosis not present

## 2021-01-09 DIAGNOSIS — I499 Cardiac arrhythmia, unspecified: Secondary | ICD-10-CM | POA: Insufficient documentation

## 2021-01-09 DIAGNOSIS — R54 Age-related physical debility: Secondary | ICD-10-CM | POA: Insufficient documentation

## 2021-01-09 DIAGNOSIS — N183 Chronic kidney disease, stage 3 unspecified: Secondary | ICD-10-CM | POA: Diagnosis not present

## 2021-01-09 DIAGNOSIS — I129 Hypertensive chronic kidney disease with stage 1 through stage 4 chronic kidney disease, or unspecified chronic kidney disease: Secondary | ICD-10-CM | POA: Diagnosis not present

## 2021-01-09 DIAGNOSIS — E039 Hypothyroidism, unspecified: Secondary | ICD-10-CM | POA: Insufficient documentation

## 2021-01-09 DIAGNOSIS — Z79899 Other long term (current) drug therapy: Secondary | ICD-10-CM | POA: Insufficient documentation

## 2021-01-09 DIAGNOSIS — Z853 Personal history of malignant neoplasm of breast: Secondary | ICD-10-CM | POA: Insufficient documentation

## 2021-01-09 DIAGNOSIS — R300 Dysuria: Secondary | ICD-10-CM

## 2021-01-09 DIAGNOSIS — K589 Irritable bowel syndrome without diarrhea: Secondary | ICD-10-CM | POA: Diagnosis not present

## 2021-01-09 DIAGNOSIS — E785 Hyperlipidemia, unspecified: Secondary | ICD-10-CM | POA: Diagnosis not present

## 2021-01-09 DIAGNOSIS — Z7901 Long term (current) use of anticoagulants: Secondary | ICD-10-CM | POA: Insufficient documentation

## 2021-01-09 DIAGNOSIS — Z86711 Personal history of pulmonary embolism: Secondary | ICD-10-CM | POA: Insufficient documentation

## 2021-01-09 LAB — URINALYSIS, ROUTINE W REFLEX MICROSCOPIC
Bilirubin Urine: NEGATIVE
Glucose, UA: NEGATIVE mg/dL
Ketones, ur: NEGATIVE mg/dL
Nitrite: NEGATIVE
Protein, ur: NEGATIVE mg/dL
Specific Gravity, Urine: 1.016 (ref 1.005–1.030)
pH: 6 (ref 5.0–8.0)

## 2021-01-09 NOTE — Progress Notes (Signed)
Gynecologic Oncology Interval Visit   Referring Provider: Adrian Prows, MD  Chief Concern: endometrial adenocarcinoma, endometrioid type, FIGO grade 1  Subjective:  Whitney Simmons is a 87 y.o. female who is seen in consultation from Dr. Gilman Schmidt for endometrial adenocarcinoma, endometrioid type, FIGO grade 1, s/p Mirena IUD who returns to clinic today for EMBx and surveillance of endometrial cancer. She was last seen in clinic on 07/11/2020.   She is doing well overall. She reports scant vaginal discharge (approximately once per month 2-3 days in a a row). She describes the drainage as light brown, with a "blood tinge", and sometimes mucus is present. She denies fevers, abdominal pain, flank pain, weight loss, appetite change.   Endometrial biopsy taken at last visit on 07/11/2020  A.  ENDOMETRIUM; PIPELLE BIOPSY:  - RESIDUAL ENDOMETRIAL ADENOCARCINOMA.  - BACKGROUND ENDOMETRIUM WITH PROGESTIN EFFECT.   Dr. Theora Gianotti called Whitney Simmons on 07/18/2020 about starting norethindrone treatment after this biopsy, but the patient reported she is not interested in Norethindrone treatment at this time.   Gynecologic Oncology History:  Whitney Simmons is a pleasant female who is seen in consultation from Dr. Gilman Schmidt for endometrial adenocarcinoma, endometrioid type, FIGO grade 1, s/p Mirena IUD.   She initially presented with postmenopausal bleeding. Work up included ultrasound, D&C, Myosure/Hysteroscopy.  09/05/2019 Pelvic ultrasound Findings:  The uterus is anteverted and measures 8.3 x 5.0 x 3.9 cm. Echo texture is heterogenous without evidence of focal masses. The Endometrium measures 5.2 mm. There is an echogenic mass within the endometrium measuring 22.2 x 15.2 x 21.0 mm. Minimal blood flow is seen within.    Bilateral ovaries are not visualized.  Survey of the adnexa demonstrates no adnexal masses. There is no free fluid in the cul de sac.   Impression: 1. There is an echogenic mass  within the endometrial canal.  2. The endometrial lining is slightly thick for a postmenopausal woman. 3. Neither ovary is visualized.    10/18/2019 D&C, Myosure/Hysteroscopy Exam under anesthesia revealed  9 cm uterus with bilateral adnexa without masses or fullness. Hysteroscopy revealed an anterior uterine mass with irregular contour, not consistent with fibroid or polyp. Mass obscured the tubal ostia. Normal appearing endocervical canal.  A. ENDOMETRIUM, CURETTAGE:  - ENDOMETRIAL ADENOCARCINOMA, ENDOMETRIOID TYPE, FIGO GRADE 1.  - BACKGROUND ATYPICAL HYPERPLASIA / EIN.   11/08/19 CT C/A/P WO Contrast did not show evidence of metastatic disease or other acute findings.   11/09/19 She had IUD placed with Dr. Gilman Schmidt   11/13/2019 After the IUD she was diagnosed with bilateral pulmonary embolism Chest CT: Partially occlusive segmental and subsegmental bilateral pulmonary emboli as described above within the right middle lobe, right lower lobe, left upper lobe and left lower lobes. No evidence of right ventricular heart strain.   03/07/20. EMBx A. ENDOMETRIUM; PIPELLE BIOPSY:  - RESIDUAL ENDOMETRIAL ADENOCARCINOMA.  - BACKGROUND ENDOMETRIUM WITH PROGESTIN EFFECT.  Adding norethindrone was discussed. Due to VTE history she declined. She opted for continued medical management and close surveillance.   She has a history of bilateral subsegmental pulmonary embolism and is currently on coumadin.   She has a strong support system and is often accompanied to clinic with her daughter Whitney Simmons who leaves near [place], [place].    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   History of pulmonary embolism 11/13/2019   IUD (  intrauterine device) in place 11/13/2019   Endometrial cancer, grade I ([place]) 10/25/2019    Postmenopausal bleeding    Vaginal discharge 06/30/2019   Difficulty hearing 12/06/2018   Bruising 05/31/2018   Lower extremity edema 09/22/2017   Gait instability 09/22/2017   Nevus 05/29/2017   Callus of foot 01/05/2017   Thrombocytopenia ([place]) 11/06/2015   Osteoporosis 03/07/2015   Postmenopausal estrogen deficiency 01/23/2015   Glaucoma 05/12/2014   History of breast cancer 02/13/2014   IBS (irritable bowel syndrome) 12/09/2013   Palpitations 09/01/2012   Chronic kidney disease, stage III (moderate) ([place]) 08/18/2012   Anxiety 08/02/2012   Hypothyroidism 07/30/2012   Hyperlipidemia 07/30/2012   Hypertension 07/30/2012    Past Medical History: Past Medical History:  Diagnosis Date   Bilateral pulmonary embolism ([place]) 10/2019   Breast microcalcification, mammographic 01/31/2014   Cancer ([place]) 02-15-14   DCIS left breast, 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 Declined postoperative radiation and antiestrogen therapy.   Edema    Hyperlipidemia    Hypertension    Hypothyroidism    IBS (irritable bowel syndrome)    Irregular heart beat    stage III kidney disease    Thyroid disease    s/p radioactive iodine ablation 30 years ago    Past Surgical History: Past Surgical History:  Procedure Laterality Date   BREAST LUMPECTOMY Left 2015   DUCTAL CARCINOMA IN SITU WITH CALCIFICATIONS   BREAST SURGERY Left 02-15-14   wide excision   CATARACT EXTRACTION     CHOLECYSTECTOMY     HYSTEROSCOPY WITH D & C N/A 10/18/2019   Procedure: DILATATION AND CURETTAGE /HYSTEROSCOPY;  Surgeon: Whitney Fellers, MD;  Location: [facility] ORS;  Service: Gynecology;  Laterality: N/A;   TOE AMPUTATION     left 5th toe    Past Gynecologic History:  Menarche: 15 Menses regular: She does not remember her menstrual h/o or when she underwent menopause. History of Abnormal pap: No Last pap: Unknown  Mammogram 03/2019  OB History:  OB History  Gravida Para Term Preterm AB Living  2         2   SAB IAB Ectopic Multiple Live Births               # Outcome Date GA Lbr Len/2nd Weight Sex Delivery Anes PTL Lv  2 Gravida           1 Gravida             Family History: Family History  Problem Relation Age of Onset   Heart disease Mother    Heart disease Father    Heart attack Father    Heart disease Maternal Aunt    Heart disease Brother    AAA (abdominal aortic aneurysm) Other    Stroke Maternal Aunt    Breast cancer Neg Hx     Social History: Social History   Socioeconomic History   Marital status: Married    Spouse name: Not on file   Number of children: Not on file   Years of education: Not on file   Highest education level: Not on file  Occupational History   Not on file  Tobacco Use   Smoking status: Never   Smokeless tobacco: Never  Vaping Use   Vaping Use: Never used  Substance and Sexual Activity   Alcohol use: No   Drug use: No   Sexual activity: Not Currently    Birth control/protection: Post-menopausal  Other Topics Concern   Not on file  Social History Narrative   Lives in [place] at [place]. From [place]. Has daughter.      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 [employer] Insecurity: Not on file  Transportation Needs: Not on file  Physical Activity: Not on file  Stress: Not on file  Social Connections: Not on file  Intimate Partner Violence: Not on file    Allergies: Allergies  Allergen Reactions   Levaquin [Levofloxacin In D5w] Other (See Comments)    hallucinations   Sulfa Antibiotics     Hallucinations     Current Medications: Current Outpatient Medications  Medication Sig Dispense Refill   BYSTOLIC 5 MG tablet TAKE 1 TABLET(5 MG) BY MOUTH DAILY 90 tablet 2   furosemide (LASIX) 20 MG tablet TAKE 1 TABLET(20 MG) BY MOUTH DAILY 90 tablet 2   latanoprost (XALATAN) 0.005 % ophthalmic solution Place 1 drop into both eyes at bedtime.      levothyroxine (SYNTHROID)  88 MCG tablet TAKE 1 TABLET BY MOUTH EVERY DAY BEFORE BREAKFAST 90 tablet 3   losartan (COZAAR) 25 MG tablet Take 1 tablet (25 mg total) by mouth daily. 90 tablet 0   warfarin (COUMADIN) 1 MG tablet Take 1 tablet (1 mg total) by mouth daily. 30 tablet 5   No current facility-administered medications for this visit.   Review of Systems Review of Systems  Constitutional:  Negative for fever, malaise/fatigue and weight loss.  HENT:  Negative for congestion and hearing loss.   Eyes:  Negative for blurred vision and double vision.  Respiratory:  Negative for cough.   Cardiovascular:  Negative for chest pain and palpitations.  Gastrointestinal:  Negative for abdominal pain, constipation, diarrhea, nausea and vomiting.  Genitourinary:  Positive for dysuria and frequency. Negative for urgency.       Vaginal discharge; vaginal itching  Skin:  Negative for rash.  Neurological:  Negative for dizziness, tingling and headaches.  Endo/Heme/Allergies:  Does not bruise/bleed easily.  Psychiatric/Behavioral:  Negative for depression. The patient is not nervous/anxious and does not have insomnia.     Objective:  Physical Examination:  BP (!) 158/63   Pulse 74   Temp 97.8 F (36.6 C)   Resp 20   Wt 187 lb 3.2 oz (84.9 kg)   SpO2 100%   BMI 31.15 kg/m    ECOG Performance Status: 2 - Symptomatic, <50% confined to bed  GENERAL: Frail appearing patient in NAD. Presents to clinic using a walker.  HEENT:  PERRL, atraumatic normocephalic NODES:  No cervical, supraclavicular, axillary, or inguinal lymphadenopathy palpated.  LUNGS:  Clear to auscultation bilaterally.   HEART:  Regular rate and rhythm.  ABDOMEN:  Soft, nontender, nondistended. No ascites, masses, or hernias.  MSK:  She is unable to lay down flat for the exam.  EXTREMITIES:  Bilateral peripheral edema.   NEURO:  Nonfocal. Well oriented.  Appropriate affect.  Pelvic: EGBUS: no lesions Cervix: no lesions, nontender, mobile. IUD  strings seen.  Vagina: no lesions, no discharge or bleeding Uterus: normal size ~8-10 cm, nontender, mobile Adnexa: no palpable masses but limited by exam Rectovaginal: deferred  Lab Review Lab Results  Component Value Date   WBC 7.5 09/26/2020   HGB 12.2 09/26/2020   HCT 37.8 09/26/2020   MCV 91.0 09/26/2020   PLT 189.2 09/26/2020     Chemistry      Component Value Date/Time   NA 140 12/05/2020 1445   K 4.0  12/05/2020 1445   CL 104 12/05/2020 1445   CO2 27 12/05/2020 1445   BUN 22 12/05/2020 1445   CREATININE 1.44 (H) 12/05/2020 1445   CREATININE 1.09 12/09/2013 1619      Component Value Date/Time   CALCIUM 8.6 12/05/2020 1445   ALKPHOS 62 03/08/2020 1311   AST 13 (L) 03/08/2020 1311   ALT 7 03/08/2020 1311   BILITOT 0.4 03/08/2020 1311      Radiologic Imaging: Per HPI     Assessment:  FRANCELLA BARNETT is a 87 y.o. female diagnosed with grade 1 endometrioid endometrial cancer s/p IUD 10/2019 with persistent disease on repeat EMBx. EMBx 1/22 showed persistent adenocarcinoma with progestin effect.  No to scant bleeding per patient report. No bleeding present on pelvic exam today.   Performance status 2.  Mild azotemia   Medical co-morbidities complicating care: Frailty; HTN; hypothyroidism; irregular heartbeat; stage III kidney disease (last creatinine 1.36).   Plan:   Problem List Items Addressed This Visit       Genitourinary   Endometrial cancer, grade I ([place]) - Primary   Relevant Orders   Urinalysis (clean catch)   Urine culture (clean catch)   We previously discussed options for persistent endometrial cancer including starting norethindrone, and radiation therapy. We previously discussed probability of complete response at 12 months is ~45% based on FEMME trial. Upon further discussion she really does not want radiation. Her focus is on quality of life and she asked about prognosis and how long it takes the cancer to progress to adjacent structures. She  wants to only consider additional treatment if she has symptomatic progressive disease. Plan to follow up in 6 months to ensure IUD strings still in place. We will only obtain EMBx if she has symptoms. And she will call us if she has any worsening symptoms.   The patient's diagnosis, an outline of the further diagnostic and laboratory studies which will be required, the recommendation, and alternatives were discussed.  All questions were answered to the patient's satisfaction.  I personally interviewed and examined the patient. Agreed with the above/below plan of care. I have directly contributed to assessment and plan of care of this patient and educated and discussed with patient and family.  Mellody Drown, MD

## 2021-01-10 ENCOUNTER — Telehealth: Payer: Self-pay | Admitting: *Deleted

## 2021-01-10 ENCOUNTER — Other Ambulatory Visit: Payer: Self-pay | Admitting: Nurse Practitioner

## 2021-01-10 MED ORDER — CEPHALEXIN 250 MG PO CAPS: 250.0000 mg | ORAL_CAPSULE | Freq: Four times a day (QID) | ORAL | 0 refills | Status: AC

## 2021-01-10 NOTE — Telephone Encounter (Signed)
Patient reports that she was seen in GYN clinic yesterday and had a urinalysis done. She reviewed the results in My Chart and would like and antibiotic.

## 2021-01-11 LAB — URINE CULTURE

## 2021-01-14 ENCOUNTER — Telehealth: Payer: Self-pay | Admitting: Family Medicine

## 2021-01-14 DIAGNOSIS — R600 Localized edema: Secondary | ICD-10-CM

## 2021-01-14 NOTE — Telephone Encounter (Signed)
Patient wants to wait for INR until her regular.time next week. She stated she will finish the abx and keep a monitor and if something changes she will return call to give update and get INR done.

## 2021-01-14 NOTE — Telephone Encounter (Signed)
It is a little hard to say when the redness might leave her legs as I am not sure what the cause is.  Given that it is getting better I would suggest monitoring.  If it does not go all the way away she should let us know.  She should complete the course of antibiotics given that her symptoms are better.  We could check her INR sometime this week if she would like to ensure that it has not gotten out of range given that she has been on antibiotics.

## 2021-01-14 NOTE — Telephone Encounter (Signed)
Noted.  Please contact the patient.  The message from the triage team notes that she has redness/bruising around her ankles.  Is this both of her ankles?  Is it redness or is it bruising?  Does she have pain in her calves?  Has the redness been spreading?  Has she had any fevers?  What urinary symptoms that she having?  Her urine culture did not grow out specific bacteria.  Have her urinary symptoms started to improve with being on the Keflex?

## 2021-01-14 NOTE — Telephone Encounter (Signed)
Transfer to Access Nurse   PT called to advise that they are taking a antibiotic that was not prescribed by their Dr for the UTI. They stated they are having some issues with their legs and wanted to advise. PT was transfer and advise no apts available for further assistance.

## 2021-01-14 NOTE — Telephone Encounter (Signed)
Providing access nurse documentation.      

## 2021-01-14 NOTE — Telephone Encounter (Signed)
Patient refused to go to ED/UC. She would like to know when the redness may leave her legs. She stated she would like to know should she finish abx. Patient stated she was given these medications at her cancer center.The redness is getting better. She has 3 days left on abx. No SOB, heart palpitations, fever, chills, nausea, vomiting, blurry vision. She is unsure she should continue due to her INR/Coumadin.

## 2021-01-14 NOTE — Telephone Encounter (Signed)
She would like to know when the redness may leave her legs. They are getting better, a lot better than yesterday per patient. She stated she would like to know is she should finish the abx. Patient stated she was given these medications at her cancer center.The redness is getting better. She has 3 days left on abx. No SOB, heart palpitations, fever, chills, nausea, vomiting, blurry vision, and calf px. Patient urinary sx are better.Her main concern is should she continue abx due to her INR/Coumadin. She was informed that abx affect those two things.

## 2021-01-17 NOTE — Addendum Note (Signed)
Addended by: Leone Haven on: 01/17/2021 08:12 PM   Modules accepted: Orders

## 2021-01-17 NOTE — Telephone Encounter (Signed)
Noted. Referral placed to vascular surgery. If the redness returns she needs to be reevaluated.

## 2021-01-17 NOTE — Telephone Encounter (Signed)
Patient wanted to give an update. She finished her abx this morning. The redness in her legs are a lot better but she does have patches of swelling on her legs. Patient is ok with a vascular referral now that pcp recommended per patient previously.

## 2021-01-21 ENCOUNTER — Telehealth: Payer: Self-pay | Admitting: Family Medicine

## 2021-01-21 NOTE — Telephone Encounter (Signed)
Providing access nurse documentation :  "Caller states she is on on blood thinner. She has had two nose bleeds. There are no appointments in the office or virtual in the next two days. She had an antibiotic for a UTI recently. She has an appointment on Wednesday to have her INR checked. She is calling to see if there are any concerns between now and Wednesday to see if any changes are made."  Please Advise

## 2021-01-21 NOTE — Telephone Encounter (Signed)
Please find out how significant the nosebleeds have been.  How long have they taken to stop?  Has she had bleeding from anywhere else?  Were these random nosebleeds or she do something to cause them?

## 2021-01-21 NOTE — Telephone Encounter (Signed)
Patioent called and said she had 2 nose bleeds over the weekend. Patient is on blood thinner and was transferred to [facility] at [place].

## 2021-01-22 NOTE — Telephone Encounter (Signed)
Left a message to call back.

## 2021-01-22 NOTE — Telephone Encounter (Signed)
Pt states that the nosebleed is coming from Sinus Pressure. Pt has had sinus pressure. Pt states that the nosebleed is not severe and that she has had no other symptoms. Pt believes that she is having these symptoms due to the antibiotic. Pt asks if this will show on her Blood Draw. I informed her that if she is having symptoms, she could not come inside the builiding. Pt then declined symptoms and states that it was just sinus pressure. Pt states that she already has transportation lined up to come inside the office. Pt declined to answer anymore questions about her nasal congestion, stating that she feels fine. Please advise.

## 2021-01-22 NOTE — Telephone Encounter (Addendum)
Tried to call patient back she ha slab appointment tomorrow, per last notes patient believes the sinus pressure is from the nose bleeds . Per patient last notes has been on ABX for UTI recently, tried X 2 reach patient received voicemail.

## 2021-01-22 NOTE — Telephone Encounter (Signed)
Noted.  Please reach out to her again this afternoon.  Thanks.

## 2021-01-23 ENCOUNTER — Other Ambulatory Visit: Payer: Self-pay

## 2021-01-23 ENCOUNTER — Other Ambulatory Visit (INDEPENDENT_AMBULATORY_CARE_PROVIDER_SITE_OTHER): Payer: PPO

## 2021-01-23 DIAGNOSIS — Z7901 Long term (current) use of anticoagulants: Secondary | ICD-10-CM

## 2021-01-23 LAB — PROTIME-INR
INR: 2.1 ratio — ABNORMAL HIGH (ref 0.8–1.0)
Prothrombin Time: 23.5 s — ABNORMAL HIGH (ref 9.6–13.1)

## 2021-01-23 NOTE — Telephone Encounter (Signed)
Noted. It looks like she came in for labs already. Please follow-up with her to see if she will provide more information on the nose blees and congestion.

## 2021-01-23 NOTE — Telephone Encounter (Signed)
Left a message to call back.

## 2021-01-24 NOTE — Telephone Encounter (Signed)
Her INR is acceptable.  She needs to continue with her current Coumadin regimen and have this rechecked in 4 weeks.

## 2021-01-25 NOTE — Telephone Encounter (Signed)
Called and spoke with Vickii Chafe and gave lab results. Taneika verbalized understanding and had no further questions.

## 2021-02-27 ENCOUNTER — Other Ambulatory Visit (INDEPENDENT_AMBULATORY_CARE_PROVIDER_SITE_OTHER): Payer: PPO

## 2021-02-27 ENCOUNTER — Other Ambulatory Visit: Payer: Self-pay

## 2021-02-27 DIAGNOSIS — Z7901 Long term (current) use of anticoagulants: Secondary | ICD-10-CM

## 2021-02-27 LAB — PROTIME-INR
INR: 2.2 ratio — ABNORMAL HIGH (ref 0.8–1.0)
Prothrombin Time: 22.6 s — ABNORMAL HIGH (ref 9.6–13.1)

## 2021-02-28 ENCOUNTER — Telehealth: Payer: Self-pay

## 2021-02-28 NOTE — Telephone Encounter (Signed)
Spoke w/Nancy. She advised that the form needs Dr. Gennette Pac signature and a brief description of why she is terminally ill (due to cancer). Fax # given to fax form.

## 2021-02-28 NOTE — Telephone Encounter (Signed)
Whitney Simmons, Daughter, reports she was at her home state of [place] last year taking care of her mom who is terminal now. She has a form she needs filled out. Inquiring best way to get it to us/Fax or email and have it returned to her. [id number]

## 2021-03-11 ENCOUNTER — Other Ambulatory Visit: Payer: Self-pay | Admitting: Family Medicine

## 2021-03-14 ENCOUNTER — Ambulatory Visit: Payer: PPO | Admitting: Podiatry

## 2021-03-18 ENCOUNTER — Other Ambulatory Visit (INDEPENDENT_AMBULATORY_CARE_PROVIDER_SITE_OTHER): Payer: PPO

## 2021-03-18 ENCOUNTER — Other Ambulatory Visit: Payer: Self-pay

## 2021-03-18 DIAGNOSIS — Z7901 Long term (current) use of anticoagulants: Secondary | ICD-10-CM | POA: Diagnosis not present

## 2021-03-18 DIAGNOSIS — Z23 Encounter for immunization: Secondary | ICD-10-CM | POA: Diagnosis not present

## 2021-03-18 LAB — PROTIME-INR
INR: 2.8 ratio — ABNORMAL HIGH (ref 0.8–1.0)
Prothrombin Time: 28.7 s — ABNORMAL HIGH (ref 9.6–13.1)

## 2021-03-22 ENCOUNTER — Telehealth: Payer: Self-pay | Admitting: Family Medicine

## 2021-03-22 NOTE — Telephone Encounter (Signed)
Patient called about her INR, message read to patient and next lab scheduled

## 2021-03-27 ENCOUNTER — Other Ambulatory Visit: Payer: PPO

## 2021-04-03 ENCOUNTER — Encounter: Payer: Self-pay | Admitting: Podiatry

## 2021-04-03 ENCOUNTER — Ambulatory Visit (INDEPENDENT_AMBULATORY_CARE_PROVIDER_SITE_OTHER): Payer: PPO | Admitting: Podiatry

## 2021-04-03 ENCOUNTER — Other Ambulatory Visit: Payer: Self-pay

## 2021-04-03 DIAGNOSIS — B351 Tinea unguium: Secondary | ICD-10-CM

## 2021-04-03 DIAGNOSIS — M79676 Pain in unspecified toe(s): Secondary | ICD-10-CM

## 2021-04-03 DIAGNOSIS — Z7901 Long term (current) use of anticoagulants: Secondary | ICD-10-CM | POA: Diagnosis not present

## 2021-04-08 ENCOUNTER — Other Ambulatory Visit: Payer: Self-pay | Admitting: Family Medicine

## 2021-04-08 ENCOUNTER — Telehealth: Payer: Self-pay | Admitting: Family Medicine

## 2021-04-08 NOTE — Progress Notes (Signed)
  Subjective:  Patient ID: Whitney Simmons, female    DOB: January 26, 1928,  MRN: [id number]  Chief Complaint  Patient presents with   Nail Problem    Nail trim RFC  No complaints today    87 y.o. female presents with the above complaint. History confirmed with patient.  Nails are thickened elongated and causing pain.  She is on Coumadin  Objective:  Physical Exam: warm, good capillary refill, no trophic changes or ulcerative lesions, normal DP and PT pulses, and normal sensory exam. Left Foot: dystrophic yellowed discolored nail plates with subungual debris Right Foot: dystrophic yellowed discolored nail plates with subungual debris  Assessment:   1. Pain due to onychomycosis of toenail   2. Long term current use of anticoagulant      Plan:  Patient was evaluated and treated and all questions answered.  Discussed the etiology and treatment options for the condition in detail with the patient. Educated patient on the topical and oral treatment options for mycotic nails. Recommended debridement of the nails today. Sharp and mechanical debridement performed of all painful and mycotic nails today. Nails debrided in length and thickness using a nail nipper to level of comfort. Discussed treatment options including appropriate shoe gear. Follow up as needed for painful nails.    Return in about 3 months (around 07/04/2021) for at risk foot care.

## 2021-04-08 NOTE — Telephone Encounter (Signed)
Patient is calling in to see if she can have her labs done at her 10/31 appointment to avoid having to arrange for transportation twice.Please advise.

## 2021-04-11 NOTE — Telephone Encounter (Signed)
That is fine 

## 2021-04-11 NOTE — Telephone Encounter (Signed)
Patient is calling in to see if she can have her labs done at her 10/31 appointment to avoid having to arrange for transportation twice.Please advise.  Whitney Simmons,cma

## 2021-04-11 NOTE — Telephone Encounter (Signed)
Patient calling back in and combined appointments to come in and see Dr Caryl Bis then have INR done 10/31

## 2021-04-24 ENCOUNTER — Other Ambulatory Visit: Payer: PPO

## 2021-04-29 ENCOUNTER — Other Ambulatory Visit: Payer: Self-pay

## 2021-04-29 ENCOUNTER — Ambulatory Visit (INDEPENDENT_AMBULATORY_CARE_PROVIDER_SITE_OTHER): Payer: PPO | Admitting: Family Medicine

## 2021-04-29 ENCOUNTER — Encounter: Payer: Self-pay | Admitting: Family Medicine

## 2021-04-29 DIAGNOSIS — R600 Localized edema: Secondary | ICD-10-CM | POA: Diagnosis not present

## 2021-04-29 DIAGNOSIS — Z86711 Personal history of pulmonary embolism: Secondary | ICD-10-CM | POA: Diagnosis not present

## 2021-04-29 DIAGNOSIS — Z7901 Long term (current) use of anticoagulants: Secondary | ICD-10-CM

## 2021-04-29 DIAGNOSIS — I2694 Multiple subsegmental pulmonary emboli without acute cor pulmonale: Secondary | ICD-10-CM

## 2021-04-29 DIAGNOSIS — C541 Malignant neoplasm of endometrium: Secondary | ICD-10-CM

## 2021-04-29 DIAGNOSIS — I10 Essential (primary) hypertension: Secondary | ICD-10-CM

## 2021-04-29 MED ORDER — WARFARIN SODIUM 1 MG PO TABS: 1.0000 mg | ORAL_TABLET | Freq: Every day | ORAL | 1 refills | Status: DC

## 2021-04-29 NOTE — Assessment & Plan Note (Addendum)
She we will continue to see GYN-ONC for this.  She has deferred hysterectomy and radiation treatment.  I discussed that I do not have the clinical expertise to provide her with an accurate prognosis.  Discussed that it is certainly possible that there could be spread of the endometrial cancer or it is possible that it could take a number of years before anything additional happens or anything in between that can happen.  I encouraged her to continue to discuss this with her specialist.

## 2021-04-29 NOTE — Assessment & Plan Note (Signed)
I discussed that this likely represents venous insufficiency with venous stasis skin changes.  She will elevate her legs and monitor.

## 2021-04-29 NOTE — Addendum Note (Signed)
Addended by: Neta Ehlers on: 04/29/2021 03:44 PM   Modules accepted: Orders

## 2021-04-29 NOTE — Progress Notes (Signed)
Tommi Rumps, MD Phone: [phone]  Whitney Simmons is a 87 y.o. female who presents today for f/u.  HYPERTENSION Disease Monitoring Home BP Monitoring not checking Chest pain- no    Dyspnea- no Medications Compliance-  taking bystolic, lasix, losartan.  Edema- chronic and stable BMET    Component Value Date/Time   NA 140 12/05/2020 1445   K 4.0 12/05/2020 1445   CL 104 12/05/2020 1445   CO2 27 12/05/2020 1445   GLUCOSE 127 (H) 12/05/2020 1445   BUN 22 12/05/2020 1445   CREATININE 1.44 (H) 12/05/2020 1445   CREATININE 1.09 12/09/2013 1619   CALCIUM 8.6 12/05/2020 1445   GFRNONAA 35 (L) 03/08/2020 1311   GFRAA 40 (L) 03/08/2020 1311   History of PE: Patient is currently on warfarin.  She is due for an INR.  She notes no bleeding issues.  Venous insufficiency: Patient has bilateral chronic leg swelling.  She wonders what the discoloration in her lower legs is related to.  Endometrial cancer: Currently has an IUD in place.  She occasionally gets some cramps and has scant discharge.  Notes these things come and go.  She follows with GYN-ONC for this.  She wonders what her prognosis is.  She feels lonely and thinks that allows her too much time to think about things.  She notes she asked the oncologist about the prognosis and notes they gave her a vague answer.  Social History   Tobacco Use  Smoking Status Never  Smokeless Tobacco Never    Current Outpatient Medications on File Prior to Visit  Medication Sig Dispense Refill   furosemide (LASIX) 20 MG tablet TAKE 1 TABLET(20 MG) BY MOUTH DAILY 90 tablet 2   latanoprost (XALATAN) 0.005 % ophthalmic solution Place 1 drop into both eyes at bedtime.      levothyroxine (SYNTHROID) 88 MCG tablet TAKE 1 TABLET BY MOUTH EVERY DAY BEFORE BREAKFAST 90 tablet 3   losartan (COZAAR) 25 MG tablet Take 1 tablet (25 mg total) by mouth daily. 90 tablet 0   nebivolol (BYSTOLIC) 5 MG tablet TAKE 1 TABLET(5 MG) BY MOUTH DAILY 90 tablet 2    No current facility-administered medications on file prior to visit.     ROS see history of present illness  Objective  Physical Exam Vitals:   04/29/21 1519  BP: 140/80  Pulse: 76  Temp: 98.7 F (37.1 C)  SpO2: 96%    BP Readings from Last 3 Encounters:  04/29/21 140/80  01/09/21 (!) 158/63  12/05/20 130/78   Wt Readings from Last 3 Encounters:  04/29/21 189 lb 9.6 oz (86 kg)  01/09/21 187 lb 3.2 oz (84.9 kg)  12/05/20 186 lb 9.6 oz (84.6 kg)    Physical Exam Constitutional:      General: She is not in acute distress.    Appearance: She is not diaphoretic.  Cardiovascular:     Rate and Rhythm: Normal rate and regular rhythm.     Heart sounds: Normal heart sounds.  Pulmonary:     Effort: Pulmonary effort is normal.     Breath sounds: Normal breath sounds.  Musculoskeletal:     Comments: Trace pitting edema bilateral lower extremities  Skin:    General: Skin is warm and dry.  Neurological:     Mental Status: She is alert.     Assessment/Plan: Please see individual problem list.  Problem List Items Addressed This Visit     Endometrial cancer, grade I ([place])    She we will  continue to see GYN-ONC for this.  She has deferred hysterectomy and radiation treatment.  I discussed that I do not have the clinical expertise to provide her with an accurate prognosis.  Discussed that it is certainly possible that there could be spread of the endometrial cancer or it is possible that it could take a number of years before anything additional happens or anything in between that can happen.  I encouraged her to continue to discuss this with her specialist.      Relevant Medications   warfarin (COUMADIN) 1 MG tablet   History of pulmonary embolism    Check INR.  Continue Coumadin 1 mg once daily.      Relevant Medications   warfarin (COUMADIN) 1 MG tablet   Hypertension    Well-controlled.  She will continue Lasix 20 mg once daily, losartan 25 mg daily, and Bystolic  5 mg daily.      Relevant Medications   warfarin (COUMADIN) 1 MG tablet   Lower extremity edema    I discussed that this likely represents venous insufficiency with venous stasis skin changes.  She will elevate her legs and monitor.      Other Visit Diagnoses     Multiple subsegmental pulmonary emboli without acute cor pulmonale (HCC)       Relevant Medications   warfarin (COUMADIN) 1 MG tablet   Other Relevant Orders   Protime-INR       Return in about 1 month (around 05/29/2021) for INR check with labs, 3 months PCP.  This visit occurred during the SARS-CoV-2 public health emergency.  Safety protocols were in place, including screening questions prior to the visit, additional usage of staff PPE, and extensive cleaning of exam room while observing appropriate contact time as indicated for disinfecting solutions.    Tommi Rumps, MD [place]

## 2021-04-29 NOTE — Assessment & Plan Note (Signed)
Check INR.  Continue Coumadin 1 mg once daily.

## 2021-04-29 NOTE — Patient Instructions (Signed)
Nice to see you. We will check your INR and let you know the results.

## 2021-04-29 NOTE — Assessment & Plan Note (Signed)
Well-controlled.  She will continue Lasix 20 mg once daily, losartan 25 mg daily, and Bystolic 5 mg daily.

## 2021-04-29 NOTE — Addendum Note (Signed)
Addended by: Leeanne Rio on: 04/29/2021 03:50 PM   Modules accepted: Orders

## 2021-04-30 LAB — PROTIME-INR
INR: 2.2 — ABNORMAL HIGH
Prothrombin Time: 21.4 s — ABNORMAL HIGH (ref 9.0–11.5)

## 2021-05-15 DIAGNOSIS — H401121 Primary open-angle glaucoma, left eye, mild stage: Secondary | ICD-10-CM | POA: Diagnosis not present

## 2021-05-28 ENCOUNTER — Other Ambulatory Visit: Payer: Self-pay | Admitting: *Deleted

## 2021-05-28 ENCOUNTER — Telehealth: Payer: Self-pay | Admitting: *Deleted

## 2021-05-28 DIAGNOSIS — N939 Abnormal uterine and vaginal bleeding, unspecified: Secondary | ICD-10-CM

## 2021-05-28 NOTE — Telephone Encounter (Signed)
Patient called reporting that she is having vaginal bleeding for past couple of weeks  with bleeding clotting and cramping. She can feel when the bleeding is coming and will run to toilet and let it run out  in toilet so she can not give me a pad count for the amt of blood which is bright red in color and clots as well. Please advise, she states she would feel better if you would please her. [phone].

## 2021-05-28 NOTE — Telephone Encounter (Signed)
Per secure chat with Alease Medina, NP, Verlon Au, RN and Linus Mako, patient has been scheduled for appointment for lab and doctor tomorrow at 230. Patient aware and accepts appts and will be in office tomorrow at 215. She was grateful for our prompt response to her needs

## 2021-05-29 ENCOUNTER — Other Ambulatory Visit: Payer: Self-pay

## 2021-05-29 ENCOUNTER — Inpatient Hospital Stay: Payer: PPO

## 2021-05-29 ENCOUNTER — Inpatient Hospital Stay: Payer: PPO | Attending: Obstetrics and Gynecology | Admitting: Obstetrics and Gynecology

## 2021-05-29 VITALS — BP 150/61 | HR 86 | Temp 97.8°F | Resp 20 | Wt 187.2 lb

## 2021-05-29 DIAGNOSIS — Z853 Personal history of malignant neoplasm of breast: Secondary | ICD-10-CM | POA: Diagnosis not present

## 2021-05-29 DIAGNOSIS — Z79890 Hormone replacement therapy (postmenopausal): Secondary | ICD-10-CM | POA: Insufficient documentation

## 2021-05-29 DIAGNOSIS — R7989 Other specified abnormal findings of blood chemistry: Secondary | ICD-10-CM | POA: Insufficient documentation

## 2021-05-29 DIAGNOSIS — I499 Cardiac arrhythmia, unspecified: Secondary | ICD-10-CM | POA: Insufficient documentation

## 2021-05-29 DIAGNOSIS — N939 Abnormal uterine and vaginal bleeding, unspecified: Secondary | ICD-10-CM

## 2021-05-29 DIAGNOSIS — Z7901 Long term (current) use of anticoagulants: Secondary | ICD-10-CM | POA: Diagnosis not present

## 2021-05-29 DIAGNOSIS — F320 Major depressive disorder, single episode, mild: Secondary | ICD-10-CM | POA: Insufficient documentation

## 2021-05-29 DIAGNOSIS — I129 Hypertensive chronic kidney disease with stage 1 through stage 4 chronic kidney disease, or unspecified chronic kidney disease: Secondary | ICD-10-CM | POA: Insufficient documentation

## 2021-05-29 DIAGNOSIS — R54 Age-related physical debility: Secondary | ICD-10-CM | POA: Diagnosis not present

## 2021-05-29 DIAGNOSIS — E039 Hypothyroidism, unspecified: Secondary | ICD-10-CM | POA: Diagnosis not present

## 2021-05-29 DIAGNOSIS — N183 Chronic kidney disease, stage 3 unspecified: Secondary | ICD-10-CM | POA: Diagnosis not present

## 2021-05-29 DIAGNOSIS — Z79899 Other long term (current) drug therapy: Secondary | ICD-10-CM | POA: Diagnosis not present

## 2021-05-29 DIAGNOSIS — E785 Hyperlipidemia, unspecified: Secondary | ICD-10-CM | POA: Diagnosis not present

## 2021-05-29 DIAGNOSIS — N63 Unspecified lump in unspecified breast: Secondary | ICD-10-CM | POA: Diagnosis not present

## 2021-05-29 DIAGNOSIS — F419 Anxiety disorder, unspecified: Secondary | ICD-10-CM | POA: Insufficient documentation

## 2021-05-29 DIAGNOSIS — C541 Malignant neoplasm of endometrium: Secondary | ICD-10-CM | POA: Diagnosis not present

## 2021-05-29 DIAGNOSIS — Z86711 Personal history of pulmonary embolism: Secondary | ICD-10-CM | POA: Diagnosis not present

## 2021-05-29 LAB — CBC WITH DIFFERENTIAL/PLATELET
Abs Immature Granulocytes: 0.04 10*3/uL (ref 0.00–0.07)
Basophils Absolute: 0.1 10*3/uL (ref 0.0–0.1)
Basophils Relative: 1 %
Eosinophils Absolute: 0.1 10*3/uL (ref 0.0–0.5)
Eosinophils Relative: 1 %
HCT: 38.1 % (ref 36.0–46.0)
Hemoglobin: 12.1 g/dL (ref 12.0–15.0)
Immature Granulocytes: 1 %
Lymphocytes Relative: 15 %
Lymphs Abs: 1.2 10*3/uL (ref 0.7–4.0)
MCH: 30 pg (ref 26.0–34.0)
MCHC: 31.8 g/dL (ref 30.0–36.0)
MCV: 94.3 fL (ref 80.0–100.0)
Monocytes Absolute: 0.7 10*3/uL (ref 0.1–1.0)
Monocytes Relative: 8 %
Neutro Abs: 6.2 10*3/uL (ref 1.7–7.7)
Neutrophils Relative %: 74 %
Platelets: 252 10*3/uL (ref 150–400)
RBC: 4.04 MIL/uL (ref 3.87–5.11)
RDW: 13.3 % (ref 11.5–15.5)
WBC: 8.3 10*3/uL (ref 4.0–10.5)
nRBC: 0 % (ref 0.0–0.2)

## 2021-05-29 LAB — IRON AND TIBC
Iron: 91 ug/dL (ref 28–170)
Saturation Ratios: 31 % (ref 10.4–31.8)
TIBC: 298 ug/dL (ref 250–450)
UIBC: 207 ug/dL

## 2021-05-29 LAB — FERRITIN: Ferritin: 49 ng/mL (ref 11–307)

## 2021-05-29 NOTE — Progress Notes (Signed)
Patient states she has been bleeding and clotting for the last 10 days. Started out as real bad cramps like a monthly cycle and then the bleeding started. Patient states bleeding is consistent. Patient states its bright red blood.

## 2021-05-29 NOTE — Progress Notes (Signed)
Gynecologic Oncology Interval Visit   Referring Provider: Adrian Prows, MD  Chief Concern: endometrial adenocarcinoma, endometrioid type, FIGO grade 1  Subjective:  Whitney Simmons is a 87 y.o. female who is seen in consultation from Dr. Gilman Schmidt for endometrial adenocarcinoma, endometrioid type, FIGO grade 1, s/p Mirena IUD who returns to clinic today for EMBx and surveillance of endometrial cancer. She was last seen in clinic on 01/09/2021.   We previously discussed options for persistent endometrial cancer including starting norethindrone, and radiation therapy. We previously discussed probability of complete response at 12 months is ~45% based on FEMME trial. Upon further discussion she really does not want radiation. Her focus is on quality of life and she asked about prognosis and how long it takes the cancer to progress to adjacent structures. She wants to only consider additional treatment if she has symptomatic progressive disease. Plan to follow up every 6 months to ensure IUD strings still in place and only obtain EMBx if she has symptoms.   Today she presents because she is having increased bleeding and pelvic cramping/abdominal pain for 10 days. The cramping and abdominal pain has now resolved.   Gynecologic Oncology History:  Whitney Simmons is a pleasant female who is seen in consultation from Dr. Gilman Schmidt for endometrial adenocarcinoma, endometrioid type, FIGO grade 1, s/p Mirena IUD.   She initially presented with postmenopausal bleeding. Work up included ultrasound, D&C, Myosure/Hysteroscopy.  09/05/2019 Pelvic ultrasound Findings:  The uterus is anteverted and measures 8.3 x 5.0 x 3.9 cm. Echo texture is heterogenous without evidence of focal masses. The Endometrium measures 5.2 mm. There is an echogenic mass within the endometrium measuring 22.2 x 15.2 x 21.0 mm. Minimal blood flow is seen within.    Bilateral ovaries are not visualized.  Survey of the adnexa  demonstrates no adnexal masses. There is no free fluid in the cul de sac.   Impression: 1. There is an echogenic mass within the endometrial canal.  2. The endometrial lining is slightly thick for a postmenopausal woman. 3. Neither ovary is visualized.    10/18/2019 D&C, Myosure/Hysteroscopy Exam under anesthesia revealed  9 cm uterus with bilateral adnexa without masses or fullness. Hysteroscopy revealed an anterior uterine mass with irregular contour, not consistent with fibroid or polyp. Mass obscured the tubal ostia. Normal appearing endocervical canal.  A. ENDOMETRIUM, CURETTAGE:  - ENDOMETRIAL ADENOCARCINOMA, ENDOMETRIOID TYPE, FIGO GRADE 1.  - BACKGROUND ATYPICAL HYPERPLASIA / EIN.   11/08/19 CT C/A/P WO Contrast did not show evidence of metastatic disease or other acute findings.   11/09/19 She had IUD placed with Dr. Gilman Schmidt   11/13/2019 After the IUD she was diagnosed with bilateral pulmonary embolism Chest CT: Partially occlusive segmental and subsegmental bilateral pulmonary emboli as described above within the right middle lobe, right lower lobe, left upper lobe and left lower lobes. No evidence of right ventricular heart strain.   03/07/20. EMBx A. ENDOMETRIUM; PIPELLE BIOPSY:  - RESIDUAL ENDOMETRIAL ADENOCARCINOMA.  - BACKGROUND ENDOMETRIUM WITH PROGESTIN EFFECT.  Adding norethindrone was discussed. Due to VTE history she declined. She opted for continued medical management and close surveillance.   Endometrial biopsy taken at last visit on 07/11/2020  A.  ENDOMETRIUM; PIPELLE BIOPSY:  - RESIDUAL ENDOMETRIAL ADENOCARCINOMA.  - BACKGROUND ENDOMETRIUM WITH PROGESTIN EFFECT.    She has a history of bilateral subsegmental pulmonary embolism and is currently on coumadin.   She has a strong support system and is often accompanied to clinic with her daughter Whitney Simmons who  leaves near [place], [place].    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   History of pulmonary embolism 11/13/2019   IUD (intrauterine device) in place 11/13/2019   Endometrial cancer, grade I ([place]) 10/25/2019   Difficulty hearing 12/06/2018   Bruising 05/31/2018   Lower extremity edema 09/22/2017   Gait instability 09/22/2017   Nevus 05/29/2017   Callus of foot 01/05/2017   Thrombocytopenia ([place]) 11/06/2015   Osteoporosis 03/07/2015   Glaucoma 05/12/2014   History of breast cancer 02/13/2014   IBS (irritable bowel syndrome) 12/09/2013   Palpitations 09/01/2012   Chronic kidney disease, stage III (moderate) ([place]) 08/18/2012   Anxiety 08/02/2012   Hypothyroidism 07/30/2012   Hyperlipidemia 07/30/2012   Hypertension 07/30/2012    Past Medical History: Past Medical History:  Diagnosis Date   Bilateral pulmonary embolism ([place]) 10/2019   Breast microcalcification, mammographic 01/31/2014   Cancer ([place]) 02-15-14   DCIS left breast, 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 Declined postoperative radiation and antiestrogen therapy.   Edema    Hyperlipidemia    Hypertension    Hypothyroidism    IBS (irritable bowel syndrome)    Irregular heart beat    stage III kidney disease    Thyroid disease    s/p radioactive iodine ablation 30 years ago    Past Surgical History: Past Surgical History:  Procedure Laterality Date   BREAST LUMPECTOMY Left 2015   DUCTAL CARCINOMA IN SITU WITH CALCIFICATIONS   BREAST SURGERY Left 02-15-14   wide excision   CATARACT EXTRACTION     CHOLECYSTECTOMY     HYSTEROSCOPY WITH D & C N/A 10/18/2019   Procedure: DILATATION AND CURETTAGE /HYSTEROSCOPY;  Surgeon: Homero Fellers, MD;  Location: [facility] ORS;  Service: Gynecology;  Laterality: N/A;   TOE AMPUTATION     left 5th toe    Past Gynecologic History:  Menarche: 15 Menses regular: She does not remember her  menstrual h/o or when she underwent menopause. History of Abnormal pap: No Last pap: Unknown  Mammogram 03/2019  OB History:  OB History  Gravida Para Term Preterm AB Living  2         2  SAB IAB Ectopic Multiple Live Births               # Outcome Date GA Lbr Len/2nd Weight Sex Delivery Anes PTL Lv  2 Gravida           1 Gravida             Family History: Family History  Problem Relation Age of Onset   Heart disease Mother    Heart disease Father    Heart attack Father    Heart disease Maternal Aunt    Heart disease Brother    AAA (abdominal aortic aneurysm) Other    Stroke Maternal Aunt    Breast cancer Neg Hx     Social History: Social History   Socioeconomic History   Marital status: Married    Spouse name: Not on file   Number of children: Not on file   Years of education: Not on file   Highest education level: Not on file  Occupational History   Not on file  Tobacco Use   Smoking status: Never   Smokeless tobacco: Never  Vaping Use   Vaping Use: Never used  Substance and Sexual Activity  Alcohol use: No   Drug use: No   Sexual activity: Not Currently    Birth control/protection: Post-menopausal  Other Topics Concern   Not on file  Social History Narrative   Lives in [place] at [place]. From [place]. Has daughter.      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 [employer] Insecurity: Not on file  Transportation Needs: Not on file  Physical Activity: Not on file  Stress: Not on file  Social Connections: Not on file  Intimate Partner Violence: Not on file    Allergies: Allergies  Allergen Reactions   Levaquin [Levofloxacin In D5w] Other (See Comments)    hallucinations   Sulfa Antibiotics     Hallucinations     Current Medications: Current Outpatient Medications  Medication Sig Dispense Refill   furosemide (LASIX) 20 MG tablet TAKE 1 TABLET(20 MG) BY MOUTH DAILY 90 tablet 2    latanoprost (XALATAN) 0.005 % ophthalmic solution Place 1 drop into both eyes at bedtime.      levothyroxine (SYNTHROID) 88 MCG tablet TAKE 1 TABLET BY MOUTH EVERY DAY BEFORE BREAKFAST 90 tablet 3   losartan (COZAAR) 25 MG tablet Take 1 tablet (25 mg total) by mouth daily. 90 tablet 0   nebivolol (BYSTOLIC) 5 MG tablet TAKE 1 TABLET(5 MG) BY MOUTH DAILY 90 tablet 2   warfarin (COUMADIN) 1 MG tablet Take 1 tablet (1 mg total) by mouth daily. 90 tablet 1   No current facility-administered medications for this visit.   Review of Systems  General: no complaints  HEENT: no complaints  Lungs: no complaints  Cardiac: no complaints  GI: abdominal pain o/w no complaints  GU: pelvic pain and vaginal bleeding   Musculoskeletal: no complaints  Extremities: no complaints  Skin: no complaints  Neuro: no complaints  Endocrine: no complaints  Psych: no complaints      Objective:  Physical Examination:  BP (!) 150/61   Pulse 86   Temp 97.8 F (36.6 C)   Resp 20   Wt 187 lb 3.2 oz (84.9 kg)   SpO2 100%   BMI 31.15 kg/m    ECOG Performance Status: 2 - Symptomatic, <50% confined to bed  GENERAL: Patient is a well appearing female in no acute distress HEENT:  PERRL, neck supple with midline trachea. Thyroid without masses.  NODES:  No cervical, supraclavicular, axillary, or inguinal lymphadenopathy palpated.  BREASTS: 1.5-2 cm nodule left breast mobile and smooth LUNGS:  normal respiratory effort HEART:  Regular rate and rhythm.  ABDOMEN:  Soft, nontender.  Positive, no masses or ascites.  EXTREMITIES:  Bilateral chronic peripheral edema.   NEURO:  Nonfocal. Well oriented.  Appropriate affect.  Pelvic: chaperoned by CMA EGBUS: no lesions Cervix: no lesions, nontender, mobile, IUD string visible Vagina: + bloody discharge Uterus: nontender, mobile, unable to determine size due to habitus; previously 8-10 cm Adnexa: no palpable masses     EMBx performed.   The risks and  benefits of the procedure were reviewed and informed consent obtained. Time out was performed. The patient received pre-procedure teaching and expressed understanding. The post-procedure instructions were reviewed with the patient and she expressed understanding. The patient does not have any barriers to learning.  Speculum placed in the vaginal vault and cervix identified. Cervix cleansed with Betadine and the pipelle was inserted to 8 cm. The specimen was obtained without difficult. Hemostasis excellent.   Post-procedure evaluation the patient was stable  without complaints.   Lab Review Lab Results  Component Value Date   WBC 8.3 05/29/2021   HGB 12.1 05/29/2021   HCT 38.1 05/29/2021   MCV 94.3 05/29/2021   PLT 252 05/29/2021   Radiologic Imaging: Per HPI     Assessment:  WILLIETTE LOEWE is a 87 y.o. female diagnosed with grade 1 endometrioid endometrial cancer s/p IUD 10/2019 with persistent disease on repeat EMBx. EMBx 1/22 showed persistent adenocarcinoma with progestin effect. Increased bleeding present on pelvic exam today. EMBx obtained   Performance status 2.  Mild azotemia   Breast mass  Medical co-morbidities complicating care: Frailty; HTN; hypothyroidism; irregular heartbeat; stage III kidney disease (last creatinine 1.36).   Plan:   Problem List Items Addressed This Visit       Genitourinary   Endometrial cancer, grade I [facility])   Relevant Orders   Surgical pathology   Other Visit Diagnoses     Vaginal bleeding    -  Primary   Relevant Orders   Surgical pathology     Symptoms concerning for progressive endometrial cancer. Follow up [facility] results and determine subsequent management.   She requested I do a breast exam. She has a h/o left lumpectomy. BI-RADS-2 on imaging. She was previously released from breast clinic. We will need to discuss with Dr. Dahlia Byes.   The patient's diagnosis, an outline of the further diagnostic and laboratory studies which will be  required, the recommendation, and alternatives were discussed.  All questions were answered to the patient's satisfaction.  Iven Earnhart Gaetana Michaelis, MD

## 2021-05-31 ENCOUNTER — Other Ambulatory Visit (INDEPENDENT_AMBULATORY_CARE_PROVIDER_SITE_OTHER): Payer: PPO

## 2021-05-31 ENCOUNTER — Other Ambulatory Visit: Payer: Self-pay

## 2021-05-31 DIAGNOSIS — Z7901 Long term (current) use of anticoagulants: Secondary | ICD-10-CM

## 2021-05-31 LAB — PROTIME-INR
INR: 1.9 ratio — ABNORMAL HIGH (ref 0.8–1.0)
Prothrombin Time: 20 s — ABNORMAL HIGH (ref 9.6–13.1)

## 2021-05-31 LAB — SURGICAL PATHOLOGY

## 2021-06-05 ENCOUNTER — Other Ambulatory Visit: Payer: Self-pay

## 2021-06-05 ENCOUNTER — Inpatient Hospital Stay: Payer: PPO | Attending: Obstetrics and Gynecology | Admitting: Obstetrics and Gynecology

## 2021-06-05 VITALS — BP 166/62 | HR 81 | Temp 97.8°F | Resp 20 | Wt 192.0 lb

## 2021-06-05 DIAGNOSIS — F320 Major depressive disorder, single episode, mild: Secondary | ICD-10-CM | POA: Insufficient documentation

## 2021-06-05 DIAGNOSIS — N183 Chronic kidney disease, stage 3 unspecified: Secondary | ICD-10-CM | POA: Diagnosis not present

## 2021-06-05 DIAGNOSIS — Z7901 Long term (current) use of anticoagulants: Secondary | ICD-10-CM | POA: Insufficient documentation

## 2021-06-05 DIAGNOSIS — Z853 Personal history of malignant neoplasm of breast: Secondary | ICD-10-CM | POA: Insufficient documentation

## 2021-06-05 DIAGNOSIS — E039 Hypothyroidism, unspecified: Secondary | ICD-10-CM | POA: Insufficient documentation

## 2021-06-05 DIAGNOSIS — E785 Hyperlipidemia, unspecified: Secondary | ICD-10-CM | POA: Insufficient documentation

## 2021-06-05 DIAGNOSIS — Z79899 Other long term (current) drug therapy: Secondary | ICD-10-CM | POA: Diagnosis not present

## 2021-06-05 DIAGNOSIS — C541 Malignant neoplasm of endometrium: Secondary | ICD-10-CM

## 2021-06-05 DIAGNOSIS — Z86711 Personal history of pulmonary embolism: Secondary | ICD-10-CM | POA: Diagnosis not present

## 2021-06-05 DIAGNOSIS — Z79890 Hormone replacement therapy (postmenopausal): Secondary | ICD-10-CM | POA: Insufficient documentation

## 2021-06-05 DIAGNOSIS — I129 Hypertensive chronic kidney disease with stage 1 through stage 4 chronic kidney disease, or unspecified chronic kidney disease: Secondary | ICD-10-CM | POA: Insufficient documentation

## 2021-06-05 NOTE — Progress Notes (Signed)
Patient states discharge started back up today. (Slightly).

## 2021-06-05 NOTE — Progress Notes (Signed)
Gynecologic Oncology Interval Visit   Referring Provider: Adrian Prows, MD  Chief Concern: endometrial adenocarcinoma, endometrioid type, FIGO grade 1  Subjective:  Whitney Simmons is a 87 y.o. female who is seen in consultation from Dr. Gilman Schmidt for endometrial adenocarcinoma, endometrioid type, FIGO grade 1, s/p Mirena IUD who returns to clinic today for discussion of endometrial biopsy results.   Patient presented last week for increased bleeding and cramping x 10 days. Underwent endometrial biopsy which demonstrated:  A. ENDOMETRIUM; BIOPSY:  - ENDOMETRIOID ENDOMETRIAL ADENOCARCINOMA, FIGO GRADE 3  Previously, we had discussed options for treatment of grade 1 malignancy and she declined radiation. She expressed desire for quality of life.   Presently she is only having light spotting. No other new concerning symptoms.  Gynecologic Oncology History:  Whitney Simmons is a pleasant female who is seen in consultation from Dr. Gilman Schmidt for endometrial adenocarcinoma, endometrioid type, FIGO grade 1, s/p Mirena IUD.   She initially presented with postmenopausal bleeding. Work up included ultrasound, D&C, Myosure/Hysteroscopy.  09/05/2019 Pelvic ultrasound Findings:  The uterus is anteverted and measures 8.3 x 5.0 x 3.9 cm. Echo texture is heterogenous without evidence of focal masses. The Endometrium measures 5.2 mm. There is an echogenic mass within the endometrium measuring 22.2 x 15.2 x 21.0 mm. Minimal blood flow is seen within.    Bilateral ovaries are not visualized.  Survey of the adnexa demonstrates no adnexal masses. There is no free fluid in the cul de sac.   Impression: 1. There is an echogenic mass within the endometrial canal.  2. The endometrial lining is slightly thick for a postmenopausal woman. 3. Neither ovary is visualized.    10/18/2019 D&C, Myosure/Hysteroscopy Exam under anesthesia revealed  9 cm uterus with bilateral adnexa without masses or fullness.  Hysteroscopy revealed an anterior uterine mass with irregular contour, not consistent with fibroid or polyp. Mass obscured the tubal ostia. Normal appearing endocervical canal.  A. ENDOMETRIUM, CURETTAGE:  - ENDOMETRIAL ADENOCARCINOMA, ENDOMETRIOID TYPE, FIGO GRADE 1.  - BACKGROUND ATYPICAL HYPERPLASIA / EIN.   11/08/19 CT C/A/P WO Contrast did not show evidence of metastatic disease or other acute findings.   11/09/19 She had IUD placed with Dr. Gilman Schmidt   11/13/2019 After the IUD she was diagnosed with bilateral pulmonary embolism Chest CT: Partially occlusive segmental and subsegmental bilateral pulmonary emboli as described above within the right middle lobe, right lower lobe, left upper lobe and left lower lobes. No evidence of right ventricular heart strain.   03/07/20. EMBx A. ENDOMETRIUM; PIPELLE BIOPSY:  - RESIDUAL ENDOMETRIAL ADENOCARCINOMA.  - BACKGROUND ENDOMETRIUM WITH PROGESTIN EFFECT.  Adding norethindrone was discussed. Due to VTE history she declined. She opted for continued medical management and close surveillance.   Endometrial biopsy taken at last visit on 07/11/2020  A.  ENDOMETRIUM; PIPELLE BIOPSY:  - RESIDUAL ENDOMETRIAL ADENOCARCINOMA.  - BACKGROUND ENDOMETRIUM WITH PROGESTIN EFFECT.   She has a history of bilateral subsegmental pulmonary embolism and is currently on coumadin.   She has a strong support system and is often accompanied to clinic with her daughter [name] who leaves near [place], [place].   We previously discussed options for persistent endometrial cancer including starting norethindrone, and radiation therapy. We previously discussed probability of complete response at 12 months is ~45% based on FEMME trial. Upon further discussion she really does not want radiation. Her focus is on quality of life and she asked about prognosis and how long it takes the cancer to progress to adjacent structures.  She wants to only consider additional treatment if  she has symptomatic progressive disease. Plan to follow up every 6 months to ensure IUD strings still in place and only obtain EMBx if she has symptoms.   Present 05/29/21 because she is having increased bleeding and pelvic cramping/abdominal pain for 10 days.    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   History of pulmonary embolism 11/13/2019   IUD (intrauterine device) in place 11/13/2019   Endometrial cancer, grade I ([place]) 10/25/2019   Difficulty hearing 12/06/2018   Bruising 05/31/2018   Lower extremity edema 09/22/2017   Gait instability 09/22/2017   Nevus 05/29/2017   Callus of foot 01/05/2017   Thrombocytopenia ([place]) 11/06/2015   Osteoporosis 03/07/2015   Glaucoma 05/12/2014   History of breast cancer 02/13/2014   IBS (irritable bowel syndrome) 12/09/2013   Palpitations 09/01/2012   Chronic kidney disease, stage III (moderate) ([place]) 08/18/2012   Anxiety 08/02/2012   Hypothyroidism 07/30/2012   Hyperlipidemia 07/30/2012   Hypertension 07/30/2012    Past Medical History: Past Medical History:  Diagnosis Date   Bilateral pulmonary embolism ([place]) 10/2019   Breast microcalcification, mammographic 01/31/2014   Cancer ([place]) 02-15-14   DCIS left breast, 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 Declined postoperative radiation and antiestrogen therapy.   Edema    Hyperlipidemia    Hypertension    Hypothyroidism    IBS (irritable bowel syndrome)    Irregular heart beat    stage III kidney disease    Thyroid disease    s/p radioactive iodine ablation 30 years ago    Past Surgical History: Past Surgical History:  Procedure Laterality Date   BREAST LUMPECTOMY Left 2015   DUCTAL CARCINOMA IN SITU WITH CALCIFICATIONS   BREAST SURGERY Left 02-15-14   wide excision   CATARACT EXTRACTION      CHOLECYSTECTOMY     HYSTEROSCOPY WITH D & C N/A 10/18/2019   Procedure: DILATATION AND CURETTAGE /HYSTEROSCOPY;  Surgeon: Homero Fellers, MD;  Location: [facility] ORS;  Service: Gynecology;  Laterality: N/A;   TOE AMPUTATION     left 5th toe    Past Gynecologic History:  Menarche: 15 Menses regular: She does not remember her menstrual h/o or when she underwent menopause. History of Abnormal pap: No Last pap: Unknown  Mammogram 03/2019  OB History:  OB History  Gravida Para Term Preterm AB Living  2         2  SAB IAB Ectopic Multiple Live Births               # Outcome Date GA Lbr Len/2nd Weight Sex Delivery Anes PTL Lv  2 Gravida           1 Gravida             Family History: Family History  Problem Relation Age of Onset   Heart disease Mother    Heart disease Father    Heart attack Father    Heart disease Maternal Aunt    Heart disease Brother    AAA (abdominal aortic aneurysm) Other    Stroke Maternal Aunt    Breast cancer Neg Hx     Social History: Social History   Socioeconomic History   Marital status: Married    Spouse name: Not on file   Number of children: Not on file   Years  of education: Not on file   Highest education level: Not on file  Occupational History   Not on file  Tobacco Use   Smoking status: Never   Smokeless tobacco: Never  Vaping Use   Vaping Use: Never used  Substance and Sexual Activity   Alcohol use: No   Drug use: No   Sexual activity: Not Currently    Birth control/protection: Post-menopausal  Other Topics Concern   Not on file  Social History Narrative   Lives in [place]. From [place]. Has daughter.      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 [employer] Insecurity: Not on file  Transportation Needs: Not on file  Physical Activity: Not on file  Stress: Not on file  Social Connections: Not on file  Intimate Partner Violence: Not on file     Allergies: Allergies  Allergen Reactions   Levaquin [Levofloxacin In D5w] Other (See Comments)    hallucinations   Sulfa Antibiotics     Hallucinations     Current Medications: Current Outpatient Medications  Medication Sig Dispense Refill   furosemide (LASIX) 20 MG tablet TAKE 1 TABLET(20 MG) BY MOUTH DAILY 90 tablet 2   latanoprost (XALATAN) 0.005 % ophthalmic solution Place 1 drop into both eyes at bedtime.      levothyroxine (SYNTHROID) 88 MCG tablet TAKE 1 TABLET BY MOUTH EVERY DAY BEFORE BREAKFAST 90 tablet 3   losartan (COZAAR) 25 MG tablet Take 1 tablet (25 mg total) by mouth daily. 90 tablet 0   nebivolol (BYSTOLIC) 5 MG tablet TAKE 1 TABLET(5 MG) BY MOUTH DAILY 90 tablet 2   warfarin (COUMADIN) 1 MG tablet Take 1 tablet (1 mg total) by mouth daily. 90 tablet 1   No current facility-administered medications for this visit.   Review of Systems General:  no complaints Skin: no complaints Eyes: no complaints HEENT: no complaints Breasts: no complaints Pulmonary: no complaints Cardiac: no complaints Gastrointestinal: no complaints Genitourinary/Sexual: no complaints Musculoskeletal: no complaints Hematology: no complaints Neurologic/Psych: no complaints  Objective:  Physical Examination:  BP (!) 166/62   Pulse 81   Temp 97.8 F (36.6 C)   Resp 20   Wt 192 lb (87.1 kg)   SpO2 100%   BMI 31.95 kg/m    ECOG Performance Status: 2 - Symptomatic, <50% confined to bed  GENERAL: Patient is a well appearing female in no acute distress HEENT:  Sclera clear. Anicteric NODES:  Negative axillary, supraclavicular, inguinal lymph node survery LUNGS:  Clear to auscultation bilaterally.   HEART:  Regular rate and rhythm.  ABDOMEN:  Soft, nontender.  No hernias, incisions well healed. No masses or ascites EXTREMITIES:  No peripheral edema. Atraumatic. No cyanosis SKIN:  Clear with no obvious rashes or skin changes.  NEURO:  Nonfocal. Well oriented.  Appropriate  affect.  Pelvic: deferred.   Lab Review Lab Results  Component Value Date   WBC 8.3 05/29/2021   HGB 12.1 05/29/2021   HCT 38.1 05/29/2021   MCV 94.3 05/29/2021   PLT 252 05/29/2021   Radiologic Imaging: Per HPI     Assessment:  Whitney Simmons is a 87 y.o. female diagnosed with grade 1 endometrioid endometrial cancer s/p IUD 10/2019 with persistent disease on repeat EMBx. EMBx 1/22 showed persistent adenocarcinoma with progestin effect. Repeat biopsy 05/29/21 showed grade 3 endometrial cancer.  She has light bleeding.  Performance status 2.  Mild azotemia  Breast mass  Medical co-morbidities complicating care: Frailty; HTN; hypothyroidism; irregular heartbeat; stage III kidney disease (last creatinine 1.36).   Plan:   Problem List Items Addressed This Visit       Genitourinary   Endometrial cancer, grade I ([place])   Other Visit Diagnoses     Endometrial cancer determined by uterine biopsy ([place])    -  Primary   Relevant Orders   CT CHEST ABDOMEN PELVIS W CONTRAST   Ambulatory referral to Cardiology      Discussed with patient and family that the transition to grade 3 cancer reflect a much more aggressive cancer and that Mirena IUD was not effective treatment.  We will order CT imaging to look for metastatic disease and also have her see her cardiologist to assess whether she is a candidate for laparoscopic hysterectomy. Discussed that if CT shows metastatic disease, then surgery would  not be an appropriate approach.  If no metastatic disease, then could consider radiation or hysterectomy/BSO.  She has a h/o left lumpectomy. BI-RADS-2 on imaging. She was previously released from breast clinic. We will need to discuss with Dr. Dahlia Byes.   The patient's diagnosis, an outline of the further diagnostic and laboratory studies which will be required, the recommendation, and alternatives were discussed.  All questions were answered to the patient's satisfaction.  Verlon Au, NP  I personally interviewed and examined the patient. Agreed with the above/below plan of care. I have directly contributed to assessment and plan of care of this patient and educated and discussed with patient and family.  Mellody Drown, MD

## 2021-06-12 ENCOUNTER — Other Ambulatory Visit: Payer: Self-pay

## 2021-06-12 ENCOUNTER — Other Ambulatory Visit (INDEPENDENT_AMBULATORY_CARE_PROVIDER_SITE_OTHER): Payer: PPO

## 2021-06-12 DIAGNOSIS — Z7901 Long term (current) use of anticoagulants: Secondary | ICD-10-CM | POA: Diagnosis not present

## 2021-06-12 LAB — PROTIME-INR
INR: 1.9 ratio — ABNORMAL HIGH (ref 0.8–1.0)
Prothrombin Time: 20.5 s — ABNORMAL HIGH (ref 9.6–13.1)

## 2021-06-18 ENCOUNTER — Encounter: Payer: Self-pay | Admitting: Obstetrics and Gynecology

## 2021-06-18 ENCOUNTER — Telehealth: Payer: Self-pay | Admitting: *Deleted

## 2021-06-18 NOTE — Telephone Encounter (Signed)
Patient daughter called stating that patient is concerned about the CT scan and possibility of kidney damage and that she looked up and found some medicine that can prevent this from happening. She is requesting a return call from [place] regarding this or the patient may cancel the scan

## 2021-06-19 ENCOUNTER — Other Ambulatory Visit: Payer: Self-pay | Admitting: Family Medicine

## 2021-06-19 ENCOUNTER — Other Ambulatory Visit: Payer: Self-pay | Admitting: *Deleted

## 2021-06-19 ENCOUNTER — Telehealth: Payer: Self-pay

## 2021-06-19 ENCOUNTER — Other Ambulatory Visit: Payer: Self-pay

## 2021-06-19 DIAGNOSIS — C541 Malignant neoplasm of endometrium: Secondary | ICD-10-CM

## 2021-06-19 DIAGNOSIS — I2694 Multiple subsegmental pulmonary emboli without acute cor pulmonale: Secondary | ICD-10-CM

## 2021-06-19 MED ORDER — WARFARIN SODIUM 1 MG PO TABS: ORAL_TABLET | ORAL | 2 refills | Status: DC

## 2021-06-19 NOTE — Telephone Encounter (Signed)
Spoke with Dr. Fransisca Connors and CT changed to without contrast. Notified Ms. Darley.

## 2021-06-20 ENCOUNTER — Telehealth: Payer: Self-pay

## 2021-06-20 NOTE — Telephone Encounter (Signed)
Received call from Ms. Whitney Simmons requesting her appt be moved from hospital to [facility] due to mobility. This change has been made for her. She is also not wanting to have oral contrast for her CT either. Dr. Fransisca Connors is aware and I have spoken with CT and made this notation in the appointment note. CT is for without oral or IV contrast at patient's request.

## 2021-06-21 NOTE — Telephone Encounter (Signed)
You spoke to [facility] about this right?  Faythe Casa, NP 06/21/2021 10:23 AM

## 2021-06-27 ENCOUNTER — Other Ambulatory Visit: Payer: PPO

## 2021-07-03 ENCOUNTER — Other Ambulatory Visit: Payer: Self-pay

## 2021-07-03 ENCOUNTER — Other Ambulatory Visit (INDEPENDENT_AMBULATORY_CARE_PROVIDER_SITE_OTHER): Payer: PPO

## 2021-07-03 DIAGNOSIS — Z7901 Long term (current) use of anticoagulants: Secondary | ICD-10-CM | POA: Diagnosis not present

## 2021-07-03 LAB — PROTIME-INR
INR: 2.1 ratio — ABNORMAL HIGH (ref 0.8–1.0)
Prothrombin Time: 21.6 s — ABNORMAL HIGH (ref 9.6–13.1)

## 2021-07-08 ENCOUNTER — Ambulatory Visit
Admission: RE | Admit: 2021-07-08 | Discharge: 2021-07-08 | Disposition: A | Discharge status: Home / Self Care | Payer: PPO | Source: Ambulatory Visit | Attending: Obstetrics and Gynecology | Admitting: Obstetrics and Gynecology

## 2021-07-08 ENCOUNTER — Other Ambulatory Visit: Payer: Self-pay

## 2021-07-08 DIAGNOSIS — M4319 Spondylolisthesis, multiple sites in spine: Secondary | ICD-10-CM | POA: Diagnosis not present

## 2021-07-08 DIAGNOSIS — K5730 Diverticulosis of large intestine without perforation or abscess without bleeding: Secondary | ICD-10-CM | POA: Diagnosis not present

## 2021-07-08 DIAGNOSIS — J9811 Atelectasis: Secondary | ICD-10-CM | POA: Diagnosis not present

## 2021-07-08 DIAGNOSIS — Z8541 Personal history of malignant neoplasm of cervix uteri: Secondary | ICD-10-CM | POA: Diagnosis not present

## 2021-07-08 DIAGNOSIS — C541 Malignant neoplasm of endometrium: Secondary | ICD-10-CM | POA: Insufficient documentation

## 2021-07-08 DIAGNOSIS — J984 Other disorders of lung: Secondary | ICD-10-CM | POA: Diagnosis not present

## 2021-07-10 ENCOUNTER — Other Ambulatory Visit: Payer: Self-pay

## 2021-07-10 ENCOUNTER — Inpatient Hospital Stay: Payer: PPO | Attending: Obstetrics and Gynecology | Admitting: Obstetrics and Gynecology

## 2021-07-10 VITALS — BP 152/55 | HR 73 | Resp 18 | Wt 189.0 lb

## 2021-07-10 DIAGNOSIS — Z923 Personal history of irradiation: Secondary | ICD-10-CM | POA: Insufficient documentation

## 2021-07-10 DIAGNOSIS — T45515A Adverse effect of anticoagulants, initial encounter: Secondary | ICD-10-CM | POA: Diagnosis not present

## 2021-07-10 DIAGNOSIS — M810 Age-related osteoporosis without current pathological fracture: Secondary | ICD-10-CM | POA: Insufficient documentation

## 2021-07-10 DIAGNOSIS — C541 Malignant neoplasm of endometrium: Secondary | ICD-10-CM | POA: Diagnosis not present

## 2021-07-10 DIAGNOSIS — I129 Hypertensive chronic kidney disease with stage 1 through stage 4 chronic kidney disease, or unspecified chronic kidney disease: Secondary | ICD-10-CM | POA: Diagnosis not present

## 2021-07-10 DIAGNOSIS — D75829 Heparin-induced thrombocytopenia, unspecified: Secondary | ICD-10-CM | POA: Insufficient documentation

## 2021-07-10 DIAGNOSIS — Z86711 Personal history of pulmonary embolism: Secondary | ICD-10-CM | POA: Diagnosis not present

## 2021-07-10 DIAGNOSIS — E785 Hyperlipidemia, unspecified: Secondary | ICD-10-CM | POA: Diagnosis not present

## 2021-07-10 DIAGNOSIS — Z7901 Long term (current) use of anticoagulants: Secondary | ICD-10-CM | POA: Diagnosis not present

## 2021-07-10 DIAGNOSIS — N183 Chronic kidney disease, stage 3 unspecified: Secondary | ICD-10-CM | POA: Diagnosis not present

## 2021-07-10 DIAGNOSIS — E039 Hypothyroidism, unspecified: Secondary | ICD-10-CM | POA: Diagnosis not present

## 2021-07-10 NOTE — Progress Notes (Signed)
Gynecologic Oncology Interval Visit   Referring Provider: Adrian Prows, MD  Chief Concern: endometrial adenocarcinoma, endometrioid type, FIGO grade 3  Subjective:  Whitney Simmons is a 88 y.o. female who is seen in consultation from Dr. Gilman Schmidt for endometrial adenocarcinoma, endometrioid type, FIGO grade 1, s/p Mirena IUD, with worsening to Grade 3, who returns to clinic for discussion of CT results  Patient was concerned about contrast negatively impacting kidney function and declined oral or IV contrast for CT scan which was performed to evaluate for disease in view of progression from Grade 1 to grade 3 despite IUD.   07/08/2021- CT C/A/P WO contrast Findings were negative for metastatic disease however, limited in view of noncontrast study.   Previously, we had discussed options for treatment of endometrial cancer and she declined radiation. She expressed desire for quality of life.   Today, she presents to discuss treatment options. She uses a walker and if she ambulated without the walker she would be at a Fall risk. She is able to walker with her walker on flat surfaces. She is in a chair over 90% of her walking hours. She does not walk up stairs. She thinks she can ambulate 1-2 blocks if needed. Someone makes her main meal of the day and she makes her other meals. She has seen a cardiologist in the past but that was a few years ago. She was accompanied to clinic with her friend; Her daughter Whitney Simmons participate via telephone. Whitney Simmons lives in [place].    Gynecologic Oncology History:  Whitney Simmons is a pleasant female who is seen in consultation from Dr. Gilman Schmidt for endometrial adenocarcinoma, endometrioid type, FIGO grade 1, s/p Mirena IUD.   She initially presented with postmenopausal bleeding. Work up included ultrasound, D&C, Myosure/Hysteroscopy.  09/05/2019 Pelvic ultrasound Findings:  The uterus is anteverted and measures 8.3 x 5.0 x 3.9 cm. Echo texture is heterogenous  without evidence of focal masses. The Endometrium measures 5.2 mm. There is an echogenic mass within the endometrium measuring 22.2 x 15.2 x 21.0 mm. Minimal blood flow is seen within.    Bilateral ovaries are not visualized.  Survey of the adnexa demonstrates no adnexal masses. There is no free fluid in the cul de sac.   Impression: 1. There is an echogenic mass within the endometrial canal.  2. The endometrial lining is slightly thick for a postmenopausal woman. 3. Neither ovary is visualized.    10/18/2019 D&C, Myosure/Hysteroscopy Exam under anesthesia revealed  9 cm uterus with bilateral adnexa without masses or fullness. Hysteroscopy revealed an anterior uterine mass with irregular contour, not consistent with fibroid or polyp. Mass obscured the tubal ostia. Normal appearing endocervical canal.  A. ENDOMETRIUM, CURETTAGE:  - ENDOMETRIAL ADENOCARCINOMA, ENDOMETRIOID TYPE, FIGO GRADE 1.  - BACKGROUND ATYPICAL HYPERPLASIA / EIN.   11/08/19 CT C/A/P WO Contrast did not show evidence of metastatic disease or other acute findings.   11/09/19 She had IUD placed with Dr. Gilman Schmidt   11/13/2019 After the IUD she was diagnosed with bilateral pulmonary embolism Chest CT: Partially occlusive segmental and subsegmental bilateral pulmonary emboli as described above within the right middle lobe, right lower lobe, left upper lobe and left lower lobes. No evidence of right ventricular heart strain.   03/07/20. EMBx A. ENDOMETRIUM; PIPELLE BIOPSY:  - RESIDUAL ENDOMETRIAL ADENOCARCINOMA.  - BACKGROUND ENDOMETRIUM WITH PROGESTIN EFFECT.  Adding norethindrone was discussed. Due to VTE history she declined. She opted for continued medical management and close surveillance.   Endometrial biopsy  taken at last visit on 07/11/2020  A.  ENDOMETRIUM; PIPELLE BIOPSY:  - RESIDUAL ENDOMETRIAL ADENOCARCINOMA.  - BACKGROUND ENDOMETRIUM WITH PROGESTIN EFFECT.   She has a history of bilateral subsegmental  pulmonary embolism and is currently on coumadin.   She has a strong support system and is often accompanied to clinic with her daughter Whitney Simmons who leaves near [place], [place].   We previously discussed options for persistent endometrial cancer including starting norethindrone, and radiation therapy. We previously discussed probability of complete response at 12 months is ~45% based on FEMME trial. Upon further discussion she really does not want radiation. Her focus is on quality of life and she asked about prognosis and how long it takes the cancer to progress to adjacent structures. She wants to only consider additional treatment if she has symptomatic progressive disease. Plan to follow up every 6 months to ensure IUD strings still in place and only obtain EMBx if she has symptoms.   Present 05/29/21 because she is having increased bleeding and pelvic cramping/abdominal pain for 10 days.   Patient presented last week for increased bleeding and cramping x 10 days. Underwent endometrial biopsy which demonstrated:  A. 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   History of pulmonary embolism 11/13/2019   IUD (intrauterine device) in place 11/13/2019   Endometrial cancer, grade I ([place]) 10/25/2019   Difficulty hearing 12/06/2018   Bruising 05/31/2018   Lower extremity edema 09/22/2017   Gait instability 09/22/2017   Nevus 05/29/2017   Callus of foot 01/05/2017   Thrombocytopenia ([place]) 11/06/2015   Osteoporosis 03/07/2015   Glaucoma 05/12/2014   History of breast cancer 02/13/2014   IBS (irritable bowel syndrome) 12/09/2013   Palpitations 09/01/2012   Chronic kidney disease, stage III (moderate) ([place]) 08/18/2012   Anxiety 08/02/2012    Hypothyroidism 07/30/2012   Hyperlipidemia 07/30/2012   Hypertension 07/30/2012    Past Medical History: Past Medical History:  Diagnosis Date   Bilateral pulmonary embolism ([place]) 10/2019   Breast microcalcification, mammographic 01/31/2014   Cancer ([place]) 02-15-14   DCIS left breast, 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 Declined postoperative radiation and antiestrogen therapy.   Edema    Hyperlipidemia    Hypertension    Hypothyroidism    IBS (irritable bowel syndrome)    Irregular heart beat    stage III kidney disease    Thyroid disease    s/p radioactive iodine ablation 30 years ago    Past Surgical History: Past Surgical History:  Procedure Laterality Date   BREAST LUMPECTOMY Left 2015   DUCTAL CARCINOMA IN SITU WITH CALCIFICATIONS   BREAST SURGERY Left 02-15-14   wide excision   CATARACT EXTRACTION     CHOLECYSTECTOMY     HYSTEROSCOPY WITH D & C N/A 10/18/2019   Procedure: DILATATION AND CURETTAGE /HYSTEROSCOPY;  Surgeon: Homero Fellers, MD;  Location: [facility] ORS;  Service: Gynecology;  Laterality: N/A;   TOE AMPUTATION     left 5th toe    Past Gynecologic History:  Menarche: 15 Menses regular: She does not remember her menstrual h/o or when she underwent menopause. History of Abnormal pap: No Last pap: Unknown  Mammogram 03/2019  OB History:  OB History  Gravida Para Term Preterm AB Living  2  2  SAB IAB Ectopic Multiple Live Births               # Outcome Date GA Lbr Len/2nd Weight Sex Delivery Anes PTL Lv  2 Gravida           1 Gravida             Family History: Family History  Problem Relation Age of Onset   Heart disease Mother    Heart disease Father    Heart attack Father    Heart disease Maternal Aunt    Heart disease Brother    AAA (abdominal aortic aneurysm) Other    Stroke Maternal Aunt    Breast cancer Neg Hx     Social History: Social History   Socioeconomic History   Marital status: Married    Spouse name:  Not on file   Number of children: Not on file   Years of education: Not on file   Highest education level: Not on file  Occupational History   Not on file  Tobacco Use   Smoking status: Never   Smokeless tobacco: Never  Vaping Use   Vaping Use: Never used  Substance and Sexual Activity   Alcohol use: No   Drug use: No   Sexual activity: Not Currently    Birth control/protection: Post-menopausal  Other Topics Concern   Not on file  Social History Narrative   Lives in [place]. From [place]. Has daughter.      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 [employer] Insecurity: Not on file  Transportation Needs: Not on file  Physical Activity: Not on file  Stress: Not on file  Social Connections: Not on file  Intimate Partner Violence: Not on file    Allergies: Allergies  Allergen Reactions   Levaquin [Levofloxacin In D5w] Other (See Comments)    hallucinations   Sulfa Antibiotics     Hallucinations     Current Medications: Current Outpatient Medications  Medication Sig Dispense Refill   furosemide (LASIX) 20 MG tablet TAKE 1 TABLET(20 MG) BY MOUTH DAILY 90 tablet 2   latanoprost (XALATAN) 0.005 % ophthalmic solution Place 1 drop into both eyes at bedtime.      levothyroxine (SYNTHROID) 88 MCG tablet TAKE 1 TABLET BY MOUTH EVERY DAY BEFORE BREAKFAST 90 tablet 3   losartan (COZAAR) 25 MG tablet Take 1 tablet (25 mg total) by mouth daily. 90 tablet 0   nebivolol (BYSTOLIC) 5 MG tablet TAKE 1 TABLET(5 MG) BY MOUTH DAILY 90 tablet 2   warfarin (COUMADIN) 1 MG tablet Take 2 mg (2 tablets) PO on Thurdays, and 1 mg (1 tablet) PO Monday, Tuesday, Wednesday, Friday, Saturday, and Sunday 90 tablet 2   No current facility-administered medications for this visit.   Review of Systems General: fatigue and weakness  HEENT: no complaints  Lungs: shortness of breath; no cough  Cardiac: no complaints  GI: no  complaints  GU: vaginal discharge/spotting/bleeding  Musculoskeletal: no complaints  Extremities: leg swelling  Skin: no complaints  Neuro: no complaints  Endocrine: no complaints  Psych: no complaints       Objective:  Physical Examination:  BP (!) 152/55 (BP Location: Left Arm, Patient Position: Sitting)    Pulse 73    Resp 18    Wt 189 lb (85.7 kg)    SpO2 99%    BMI 31.45 kg/m    ECOG Performance  Status: 2 - Symptomatic, <50% confined to bed  GENERAL: Patient is an elderly ppearing female in no acute distress. She has a walker HEENT:  atraumatic normocephalic. Pupil R > L LUNGS:  Clear to auscultation bilaterally.   HEART:  Regular rate and rhythm.  EXTREMITIES:  bilateral peripheral edema.   NEURO:  Nonfocal. Well oriented.  Appropriate affect.  Remainder of exam deferred  Lab Review Lab Results  Component Value Date   WBC 8.3 05/29/2021   HGB 12.1 05/29/2021   HCT 38.1 05/29/2021   MCV 94.3 05/29/2021   PLT 252 05/29/2021     Chemistry      Component Value Date/Time   NA 140 12/05/2020 1445   K 4.0 12/05/2020 1445   CL 104 12/05/2020 1445   CO2 27 12/05/2020 1445   BUN 22 12/05/2020 1445   CREATININE 1.44 (H) 12/05/2020 1445   CREATININE 1.09 12/09/2013 1619      Component Value Date/Time   CALCIUM 8.6 12/05/2020 1445   ALKPHOS 62 03/08/2020 1311   AST 13 (L) 03/08/2020 1311   ALT 7 03/08/2020 1311   BILITOT 0.4 03/08/2020 1311       Radiologic Imaging: 07/08/2020 EXAM: CT CHEST, ABDOMEN AND PELVIS WITHOUT CONTRAST   TECHNIQUE: Multidetector CT imaging of the chest, abdomen and pelvis was performed following the standard protocol without IV contrast.   COMPARISON:  CT abdomen pelvis 11/08/2019, CT abdomen pelvis 03/07/2016   FINDINGS: CT CHEST FINDINGS   Cardiovascular:   Normal heart size. No significant pericardial effusion. The thoracic aorta is normal in caliber. No atherosclerotic plaque of the thoracic aorta. No coronary artery  calcifications.   Mediastinum/Nodes: No gross hilar adenopathy, noting limited sensitivity for the detection of hilar adenopathy on this noncontrast study. no enlarged mediastinal or axillary lymph nodes. Thyroid gland, trachea, and esophagus demonstrate no significant findings.   Lungs/Pleura: Elevated left hemidiaphragm with left base atelectasis. No focal consolidation. Couple of stable right middle lobe punctate calcified densities (3:60). No pulmonary mass. No pleural effusion. No pneumothorax.   Musculoskeletal:   Similar-appearing left breast fat necrosis.   No suspicious lytic or blastic osseous lesions. No acute displaced fracture. Grade 1 anterolisthesis of C7 on T1. Degenerative changes of the visualized cervical spine. Grade 1 anterolisthesis of C6 on C7.   CT ABDOMEN PELVIS FINDINGS   Hepatobiliary: No focal liver abnormality. Status post cholecystectomy. No biliary dilatation.   Pancreas: Diffusely atrophic. No focal lesion. Otherwise normal pancreatic contour. No surrounding inflammatory changes. No main pancreatic ductal dilatation.   Spleen: Normal in size without focal abnormality.   Adrenals/Urinary Tract:   No adrenal nodule bilaterally.   Bilateral renal cortical scarring. Bilateral renal atrophy. No nephrolithiasis and no hydronephrosis. No definite contour-deforming renal mass.   No ureterolithiasis or hydroureter.   The urinary bladder is unremarkable.   Stomach/Bowel: Stomach is within normal limits. No evidence of bowel wall thickening or dilatation. Diffuse sigmoid diverticulosis. Appendix appears normal.   Vascular/Lymphatic: No abdominal aorta or iliac aneurysm. At least moderate to severe atherosclerotic plaque of the aorta and its branches. No abdominal, pelvic, or inguinal lymphadenopathy.   Reproductive: T shaped intrauterine device noted in the expected region of the endometrial canal in grossly appropriate position. Uterus and  bilateral adnexa are unremarkable.   Other: No intraperitoneal free fluid. No intraperitoneal free gas. No organized fluid collection.   Musculoskeletal:   No abdominal wall hernia or abnormality.   Densely sclerotic lesion of the right iliac bone likely a  bone island. No suspicious lytic or blastic osseous lesions. No acute displaced fracture. Multilevel degenerative changes of the spine.   IMPRESSION: 1. No definite finding of metastases in a patient with endometrial cancer; however, please note limited evaluation on this noncontrast study. 2. T-shaped intrauterine device in appropriate position. 3. Other imaging findings of potential clinical significance: Diffuse sigmoid diverticulosis with no acute diverticulitis. Similar-appearing left breast fat necrosis.      Assessment:  Whitney Simmons is a 88 y.o. female diagnosed with grade 1 endometrioid endometrial cancer s/p IUD 10/2019 with persistent disease on repeat EMBx. EMBx 1/22 showed persistent adenocarcinoma with progestin effect. Repeat biopsy 05/29/21 showed grade 3 endometrial cancer.  She has light bleeding.  Performance status 2  Mild azotemia   Similar-appearing left breast fat necrosis.   History of PE on anticoagulation  Frailty and MET < 4  Medical co-morbidities complicating care: Frailty; HTN; hypothyroidism; irregular heartbeat; stage III kidney disease (last creatinine 1.36).   Plan:   Problem List Items Addressed This Visit   None Visit Diagnoses     Endometrial cancer determined by uterine biopsy [facility])    -  Primary   Relevant Orders   Ambulatory referral to Radiation Oncology       Discussed with patient and family that the transition to grade 3 cancer reflect a much more aggressive cancer and recommended treatment. In fact, we advised against observation. CT imaging negative for metastatic disease and options of radiation or hysterectomy/BSO. Given frailty, MET score < 4 and risk of severe  complications of ~62% we are favoring radiation therapy. She has concerns about radiation. We recommended Radiation Oncology consult so she could review with Dr. Baruch Gouty to discuss further.   We are not able to do a "tug test" due to her fall risk and if she were to do the tug test with the walker the results may not be accurate. A MET < 4 does put her at a higher risk for perioperative complications.        She has a h/o left lumpectomy. BI-RADS-2 on imaging. She was previously released from breast clinic. Imaging studies c/w fat necrosis.   I personally had a face to face interaction and evaluated the patient jointly with the NP, Ms. Beckey Rutter.  I have reviewed her history and available records and have performed the key portions of the physical exam including HEENT, neuro, and general exam with my findings confirming those documented above by the APP.  I have discussed the case with the APP and the patient.  I agree with the above documentation, assessment and plan which was fully formulated by me.  Counseling was completed by me.   A total of at least 40 minutes were spent with the patient/family today; >50% was spent in education, counseling and coordination of care for endometrials cancer.   I personally saw the patient and performed a substantive portion of this encounter in conjunction with the listed APP as documented above.  Angeles Gaetana Michaelis, MD

## 2021-07-11 ENCOUNTER — Telehealth: Payer: Self-pay | Admitting: Family Medicine

## 2021-07-11 ENCOUNTER — Other Ambulatory Visit: Payer: Self-pay | Admitting: Family Medicine

## 2021-07-11 DIAGNOSIS — I10 Essential (primary) hypertension: Secondary | ICD-10-CM

## 2021-07-11 NOTE — Telephone Encounter (Signed)
Pt called in requesting for results. Pt stated that she went last week to do her INR. Pt stated that she never received a call about her results. Pt requesting callback

## 2021-07-12 MED ORDER — LOSARTAN POTASSIUM 25 MG PO TABS: 25.0000 mg | ORAL_TABLET | Freq: Every day | ORAL | 1 refills | Status: DC

## 2021-07-12 NOTE — Telephone Encounter (Signed)
Pt calling in regards to previous message. Pt was advised a provider has not yet read the result however she will be called as soon as a provider reads it. Pt asks that we leave a detailed message on VM if she is unable to answer the phone.

## 2021-07-15 ENCOUNTER — Telehealth: Payer: Self-pay

## 2021-07-15 ENCOUNTER — Ambulatory Visit: Payer: PPO | Admitting: Podiatry

## 2021-07-15 ENCOUNTER — Encounter: Payer: Self-pay | Admitting: *Deleted

## 2021-07-15 NOTE — Telephone Encounter (Signed)
Signed               Her INR is acceptable.  She needs to continue with her current Coumadin regimen and have this rechecked in 4 weeks.      The patent was called and informed of her results  for today and she was advised  to keep her regimen from her 01/24/2021 results that were the same until the results are resulted.  Patient also scheduled a lab appointment in 4 weeks.  Aigner Horseman,cma

## 2021-07-16 NOTE — Telephone Encounter (Signed)
I called the patient and informed her of her lab results and to continue her current regimen and she understood.  Patient is scheduled for a lab appointment in 3 weeks.  Whitney Simmons,cma

## 2021-07-22 ENCOUNTER — Other Ambulatory Visit: Payer: Self-pay

## 2021-07-22 ENCOUNTER — Ambulatory Visit
Admission: RE | Admit: 2021-07-22 | Discharge: 2021-07-22 | Disposition: A | Discharge status: Home / Self Care | Payer: PPO | Source: Ambulatory Visit | Attending: Radiation Oncology | Admitting: Radiation Oncology

## 2021-07-22 ENCOUNTER — Encounter: Payer: Self-pay | Admitting: Radiation Oncology

## 2021-07-22 VITALS — BP 142/69 | HR 68 | Temp 97.1°F | Resp 16 | Wt 187.0 lb

## 2021-07-22 DIAGNOSIS — C541 Malignant neoplasm of endometrium: Secondary | ICD-10-CM | POA: Diagnosis not present

## 2021-07-22 NOTE — Consult Note (Signed)
NEW PATIENT EVALUATION  Name: Whitney Simmons  MRN: [id number]  Date:   07/22/2021     DOB: [birth year]   This 88 y.o. female patient presents to the clinic for initial evaluation of FIGO grade 3 endometrioid endometrial adenocarcinoma in 88 year old female nonoperable candidate with continued persistent bleeding as well as advancement and FIGO grade.  REFERRING PHYSICIAN: Leone Haven, MD  CHIEF COMPLAINT: No chief complaint on file.   DIAGNOSIS: There were no encounter diagnoses.   PREVIOUS INVESTIGATIONS:  Pathology report reviewed CT scans reviewed Clinical notes reviewed   HPI: Patient is a 88 year old female originally diagnosed over 2 years prior when she presented with postmenopausal bleeding.  At that time uterus was measured at 8.3 x 5 x 3.9 cm with endometrium measuring 5.2 mm.  Ultrasound showed an echogenic mass within the endometrial canal biopsy showing curettage as were positive for FIGO grade 1 endometrial adenocarcinoma.  She had an IUD placed back in May 2021.  She has been having persistent and progressive uterine bleeding she had repeat biopsy back in November showing now FIGO grade 3 endometrial adenocarcinoma.  Recent CT scan of abdomen pelvis showed no definitive findings for metastatic disease there is a T-shaped intrauterine device present.  She is now referred to radiation oncology for consideration of treatment.  She states she is having no significant lower urinary tract symptoms diarrhea.  She is accompanied by her daughter.  PLANNED TREATMENT REGIMEN: External beam radiation therapy to pelvis  PAST MEDICAL HISTORY:  has a past medical history of Bilateral pulmonary embolism ([place]) (10/2019), Breast microcalcification, mammographic (01/31/2014), Cancer ([place]) (02/15/2014), Edema, Endometrial cancer ([place]), Hyperlipidemia, Hypertension, Hypothyroidism, IBS (irritable bowel syndrome), Irregular heart beat, stage III kidney disease, and Thyroid disease.    PAST  SURGICAL HISTORY:  Past Surgical History:  Procedure Laterality Date   BREAST LUMPECTOMY Left 2015   DUCTAL CARCINOMA IN SITU WITH CALCIFICATIONS   BREAST SURGERY Left 02-15-14   wide excision   CATARACT EXTRACTION     CHOLECYSTECTOMY     HYSTEROSCOPY WITH D & C N/A 10/18/2019   Procedure: DILATATION AND CURETTAGE /HYSTEROSCOPY;  Surgeon: Homero Fellers, MD;  Location: [facility] ORS;  Service: Gynecology;  Laterality: N/A;   TOE AMPUTATION     left 5th toe    FAMILY HISTORY: family history includes AAA (abdominal aortic aneurysm) in an other family member; Heart attack in her father; Heart disease in her brother, father, maternal aunt, and mother; Stroke in her maternal aunt.  SOCIAL HISTORY:  reports that she has never smoked. She has never used smokeless tobacco. She reports that she does not drink alcohol and does not use drugs.  ALLERGIES: Levaquin [levofloxacin in d5w] and Sulfa antibiotics  MEDICATIONS:  Current Outpatient Medications  Medication Sig Dispense Refill   furosemide (LASIX) 20 MG tablet TAKE 1 TABLET(20 MG) BY MOUTH DAILY 90 tablet 2   latanoprost (XALATAN) 0.005 % ophthalmic solution Place 1 drop into both eyes at bedtime.      levothyroxine (SYNTHROID) 88 MCG tablet TAKE 1 TABLET BY MOUTH EVERY DAY BEFORE BREAKFAST 90 tablet 3   losartan (COZAAR) 25 MG tablet Take 1 tablet (25 mg total) by mouth daily. 90 tablet 1   nebivolol (BYSTOLIC) 5 MG tablet TAKE 1 TABLET(5 MG) BY MOUTH DAILY 90 tablet 2   warfarin (COUMADIN) 1 MG tablet Take 2 mg (2 tablets) PO on Thurdays, and 1 mg (1 tablet) PO Monday, Tuesday, Wednesday, Friday, Saturday, and Sunday 90 tablet 2  No current facility-administered medications for this encounter.    ECOG PERFORMANCE STATUS:  1 - Symptomatic but completely ambulatory  REVIEW OF SYSTEMS: Patient denies any weight loss, fatigue, weakness, fever, chills or night sweats. Patient denies any loss of vision, blurred vision. Patient denies any  ringing  of the ears or hearing loss. No irregular heartbeat. Patient denies heart murmur or history of fainting. Patient denies any chest pain or pain radiating to her upper extremities. Patient denies any shortness of breath, difficulty breathing at night, cough or hemoptysis. Patient denies any swelling in the lower legs. Patient denies any nausea vomiting, vomiting of blood, or coffee ground material in the vomitus. Patient denies any stomach pain. Patient states has had normal bowel movements no significant constipation or diarrhea. Patient denies any dysuria, hematuria or significant nocturia. Patient denies any problems walking, swelling in the joints or loss of balance. Patient denies any skin changes, loss of hair or loss of weight. Patient denies any excessive worrying or anxiety or significant depression. Patient denies any problems with insomnia. Patient denies excessive thirst, polyuria, polydipsia. Patient denies any swollen glands, patient denies easy bruising or easy bleeding. Patient denies any recent infections, allergies or URI. Patient "s visual fields have not changed significantly in recent time.   PHYSICAL EXAM: BP (!) 142/69 (BP Location: Right Arm, Patient Position: Sitting, Cuff Size: Normal)    Pulse 68    Temp (!) 97.1 F (36.2 C) (Tympanic)    Resp 16    Wt 187 lb (84.8 kg)    BMI 31.12 kg/m  Elderly female in NAD.  Well-developed well-nourished patient in NAD. HEENT reveals PERLA, EOMI, discs not visualized.  Oral cavity is clear. No oral mucosal lesions are identified. Neck is clear without evidence of cervical or supraclavicular adenopathy. Lungs are clear to A&P. Cardiac examination is essentially unremarkable with regular rate and rhythm without murmur rub or thrill. Abdomen is benign with no organomegaly or masses noted. Motor sensory and DTR levels are equal and symmetric in the upper and lower extremities. Cranial nerves II through XII are grossly intact. Proprioception is  intact. No peripheral adenopathy or edema is identified. No motor or sensory levels are noted. Crude visual fields are within normal range.  LABORATORY DATA: Pathology report reviewed    RADIOLOGY RESULTS: CT scan reviewed compatible with above-stated findings   IMPRESSION: FIGO grade 3 endometrial adenocarcinoma and 88 year old female nonsurgical candidate with persistent bleeding and dedifferentiation of tumor  PLAN: At this time of offered palliative radiation therapy to her pelvis.  We will plan on delivering 73 Gray to her whole pelvis and evaluate for either brachytherapy with Dr. Christel Mormon at [facility] or further radiation therapy to her uterus.  Risks and benefits of treatment occluding skin reaction fatigue alteration of blood counts increased lower urinary tract symptoms diarrhea all were discussed in detail with the patient and her daughters.  They both comprehend my recommendations well.  I personally set up and ordered CT simulation for later this week.  We will try to start treatments first thing next week to try to get the bleeding under control.  I would like to take this opportunity to thank you for allowing me to participate in the care of your patient.Noreene Filbert, MD

## 2021-07-24 ENCOUNTER — Ambulatory Visit
Admission: RE | Admit: 2021-07-24 | Discharge: 2021-07-24 | Disposition: A | Discharge status: Home / Self Care | Payer: PPO | Source: Ambulatory Visit | Attending: Radiation Oncology | Admitting: Radiation Oncology

## 2021-07-24 ENCOUNTER — Other Ambulatory Visit: Payer: Self-pay | Admitting: Hospice and Palliative Medicine

## 2021-07-24 ENCOUNTER — Other Ambulatory Visit: Payer: Self-pay | Admitting: *Deleted

## 2021-07-24 ENCOUNTER — Ambulatory Visit: Payer: PPO

## 2021-07-24 DIAGNOSIS — Z510 Encounter for antineoplastic radiation therapy: Secondary | ICD-10-CM | POA: Diagnosis not present

## 2021-07-24 DIAGNOSIS — C541 Malignant neoplasm of endometrium: Secondary | ICD-10-CM | POA: Insufficient documentation

## 2021-07-24 MED ORDER — ALPRAZOLAM 0.25 MG PO TABS: 0.2500 mg | ORAL_TABLET | Freq: Every day | ORAL | 0 refills | Status: DC | PRN

## 2021-07-24 NOTE — Progress Notes (Signed)
Dr. Baruch Gouty asked me to send prescription for alprazolam 0.25 mg daily as needed to take prior to radiation treatments.  Rx sent to pharmacy

## 2021-07-26 DIAGNOSIS — Z510 Encounter for antineoplastic radiation therapy: Secondary | ICD-10-CM | POA: Diagnosis not present

## 2021-07-26 DIAGNOSIS — C541 Malignant neoplasm of endometrium: Secondary | ICD-10-CM | POA: Diagnosis not present

## 2021-07-29 ENCOUNTER — Ambulatory Visit
Admission: RE | Admit: 2021-07-29 | Discharge: 2021-07-29 | Disposition: A | Discharge status: Home / Self Care | Payer: PPO | Source: Ambulatory Visit | Attending: Radiation Oncology | Admitting: Radiation Oncology

## 2021-07-29 DIAGNOSIS — Z510 Encounter for antineoplastic radiation therapy: Secondary | ICD-10-CM | POA: Diagnosis not present

## 2021-07-29 DIAGNOSIS — C541 Malignant neoplasm of endometrium: Secondary | ICD-10-CM | POA: Diagnosis not present

## 2021-07-29 NOTE — Telephone Encounter (Signed)
I called the patient and she will come in before 10 am due to transportation problem and her radiation treatments.  Iya Hamed,cma

## 2021-07-29 NOTE — Telephone Encounter (Addendum)
Pt called in stating that she have a weekly treatment of radiation. Pt stated that her INR is conflicting with her radiation treatment that is on 08/07/2021. Pt would like to know if she can skip this coming appt. Pt requesting a callback.

## 2021-07-30 ENCOUNTER — Ambulatory Visit
Admission: RE | Admit: 2021-07-30 | Discharge: 2021-07-30 | Disposition: A | Discharge status: Home / Self Care | Payer: PPO | Source: Ambulatory Visit | Attending: Radiation Oncology | Admitting: Radiation Oncology

## 2021-07-30 DIAGNOSIS — Z510 Encounter for antineoplastic radiation therapy: Secondary | ICD-10-CM | POA: Diagnosis not present

## 2021-07-30 DIAGNOSIS — C541 Malignant neoplasm of endometrium: Secondary | ICD-10-CM | POA: Diagnosis not present

## 2021-07-31 ENCOUNTER — Ambulatory Visit
Admission: RE | Admit: 2021-07-31 | Discharge: 2021-07-31 | Disposition: A | Discharge status: Home / Self Care | Payer: PPO | Source: Ambulatory Visit | Attending: Radiation Oncology | Admitting: Radiation Oncology

## 2021-07-31 DIAGNOSIS — C541 Malignant neoplasm of endometrium: Secondary | ICD-10-CM | POA: Diagnosis not present

## 2021-07-31 DIAGNOSIS — Z510 Encounter for antineoplastic radiation therapy: Secondary | ICD-10-CM | POA: Diagnosis not present

## 2021-08-01 ENCOUNTER — Ambulatory Visit
Admission: RE | Admit: 2021-08-01 | Discharge: 2021-08-01 | Disposition: A | Discharge status: Home / Self Care | Payer: PPO | Source: Ambulatory Visit | Attending: Radiation Oncology | Admitting: Radiation Oncology

## 2021-08-01 DIAGNOSIS — Z510 Encounter for antineoplastic radiation therapy: Secondary | ICD-10-CM | POA: Diagnosis not present

## 2021-08-01 DIAGNOSIS — C541 Malignant neoplasm of endometrium: Secondary | ICD-10-CM | POA: Diagnosis not present

## 2021-08-02 ENCOUNTER — Ambulatory Visit
Admission: RE | Admit: 2021-08-02 | Discharge: 2021-08-02 | Disposition: A | Discharge status: Home / Self Care | Payer: PPO | Source: Ambulatory Visit | Attending: Radiation Oncology | Admitting: Radiation Oncology

## 2021-08-02 DIAGNOSIS — C541 Malignant neoplasm of endometrium: Secondary | ICD-10-CM | POA: Diagnosis not present

## 2021-08-02 DIAGNOSIS — Z510 Encounter for antineoplastic radiation therapy: Secondary | ICD-10-CM | POA: Diagnosis not present

## 2021-08-05 ENCOUNTER — Ambulatory Visit
Admission: RE | Admit: 2021-08-05 | Discharge: 2021-08-05 | Disposition: A | Discharge status: Home / Self Care | Payer: PPO | Source: Ambulatory Visit | Attending: Radiation Oncology | Admitting: Radiation Oncology

## 2021-08-05 DIAGNOSIS — C541 Malignant neoplasm of endometrium: Secondary | ICD-10-CM | POA: Diagnosis not present

## 2021-08-05 DIAGNOSIS — Z510 Encounter for antineoplastic radiation therapy: Secondary | ICD-10-CM | POA: Diagnosis not present

## 2021-08-06 ENCOUNTER — Ambulatory Visit
Admission: RE | Admit: 2021-08-06 | Discharge: 2021-08-06 | Disposition: A | Discharge status: Home / Self Care | Payer: PPO | Source: Ambulatory Visit | Attending: Radiation Oncology | Admitting: Radiation Oncology

## 2021-08-06 DIAGNOSIS — C541 Malignant neoplasm of endometrium: Secondary | ICD-10-CM | POA: Diagnosis not present

## 2021-08-06 DIAGNOSIS — Z510 Encounter for antineoplastic radiation therapy: Secondary | ICD-10-CM | POA: Diagnosis not present

## 2021-08-07 ENCOUNTER — Other Ambulatory Visit (INDEPENDENT_AMBULATORY_CARE_PROVIDER_SITE_OTHER): Payer: PPO

## 2021-08-07 ENCOUNTER — Ambulatory Visit
Admission: RE | Admit: 2021-08-07 | Discharge: 2021-08-07 | Disposition: A | Discharge status: Home / Self Care | Payer: PPO | Source: Ambulatory Visit | Attending: Radiation Oncology | Admitting: Radiation Oncology

## 2021-08-07 ENCOUNTER — Other Ambulatory Visit: Payer: Self-pay

## 2021-08-07 DIAGNOSIS — C541 Malignant neoplasm of endometrium: Secondary | ICD-10-CM | POA: Diagnosis not present

## 2021-08-07 DIAGNOSIS — Z510 Encounter for antineoplastic radiation therapy: Secondary | ICD-10-CM | POA: Diagnosis not present

## 2021-08-07 DIAGNOSIS — Z7901 Long term (current) use of anticoagulants: Secondary | ICD-10-CM | POA: Diagnosis not present

## 2021-08-07 LAB — PROTIME-INR
INR: 2.5 ratio — ABNORMAL HIGH (ref 0.8–1.0)
Prothrombin Time: 26 s — ABNORMAL HIGH (ref 9.6–13.1)

## 2021-08-08 ENCOUNTER — Ambulatory Visit
Admission: RE | Admit: 2021-08-08 | Discharge: 2021-08-08 | Disposition: A | Discharge status: Home / Self Care | Payer: PPO | Source: Ambulatory Visit | Attending: Radiation Oncology | Admitting: Radiation Oncology

## 2021-08-08 DIAGNOSIS — Z510 Encounter for antineoplastic radiation therapy: Secondary | ICD-10-CM | POA: Diagnosis not present

## 2021-08-08 DIAGNOSIS — C541 Malignant neoplasm of endometrium: Secondary | ICD-10-CM | POA: Diagnosis not present

## 2021-08-09 ENCOUNTER — Ambulatory Visit
Admission: RE | Admit: 2021-08-09 | Discharge: 2021-08-09 | Disposition: A | Discharge status: Home / Self Care | Payer: PPO | Source: Ambulatory Visit | Attending: Radiation Oncology | Admitting: Radiation Oncology

## 2021-08-09 DIAGNOSIS — C541 Malignant neoplasm of endometrium: Secondary | ICD-10-CM | POA: Diagnosis not present

## 2021-08-09 DIAGNOSIS — Z510 Encounter for antineoplastic radiation therapy: Secondary | ICD-10-CM | POA: Diagnosis not present

## 2021-08-12 ENCOUNTER — Other Ambulatory Visit: Payer: Self-pay

## 2021-08-12 ENCOUNTER — Ambulatory Visit
Admission: RE | Admit: 2021-08-12 | Discharge: 2021-08-12 | Disposition: A | Discharge status: Home / Self Care | Payer: PPO | Source: Ambulatory Visit | Attending: Radiation Oncology | Admitting: Radiation Oncology

## 2021-08-12 DIAGNOSIS — R600 Localized edema: Secondary | ICD-10-CM

## 2021-08-12 DIAGNOSIS — C541 Malignant neoplasm of endometrium: Secondary | ICD-10-CM | POA: Diagnosis not present

## 2021-08-12 DIAGNOSIS — E039 Hypothyroidism, unspecified: Secondary | ICD-10-CM

## 2021-08-12 DIAGNOSIS — I10 Essential (primary) hypertension: Secondary | ICD-10-CM

## 2021-08-12 DIAGNOSIS — Z510 Encounter for antineoplastic radiation therapy: Secondary | ICD-10-CM | POA: Diagnosis not present

## 2021-08-12 MED ORDER — LEVOTHYROXINE SODIUM 88 MCG PO TABS: ORAL_TABLET | ORAL | 3 refills | Status: DC

## 2021-08-12 MED ORDER — FUROSEMIDE 20 MG PO TABS: ORAL_TABLET | ORAL | 2 refills | Status: AC

## 2021-08-12 MED ORDER — LOSARTAN POTASSIUM 25 MG PO TABS: 25.0000 mg | ORAL_TABLET | Freq: Every day | ORAL | 1 refills | Status: AC

## 2021-08-12 MED ORDER — NEBIVOLOL HCL 5 MG PO TABS: ORAL_TABLET | ORAL | 2 refills | Status: AC

## 2021-08-13 ENCOUNTER — Ambulatory Visit
Admission: RE | Admit: 2021-08-13 | Discharge: 2021-08-13 | Disposition: A | Discharge status: Home / Self Care | Payer: PPO | Source: Ambulatory Visit | Attending: Radiation Oncology | Admitting: Radiation Oncology

## 2021-08-13 DIAGNOSIS — C541 Malignant neoplasm of endometrium: Secondary | ICD-10-CM | POA: Diagnosis not present

## 2021-08-13 DIAGNOSIS — Z510 Encounter for antineoplastic radiation therapy: Secondary | ICD-10-CM | POA: Diagnosis not present

## 2021-08-14 ENCOUNTER — Ambulatory Visit
Admission: RE | Admit: 2021-08-14 | Discharge: 2021-08-14 | Disposition: A | Discharge status: Home / Self Care | Payer: PPO | Source: Ambulatory Visit | Attending: Radiation Oncology | Admitting: Radiation Oncology

## 2021-08-14 ENCOUNTER — Ambulatory Visit: Payer: PPO

## 2021-08-14 DIAGNOSIS — C541 Malignant neoplasm of endometrium: Secondary | ICD-10-CM | POA: Diagnosis not present

## 2021-08-14 DIAGNOSIS — Z510 Encounter for antineoplastic radiation therapy: Secondary | ICD-10-CM | POA: Diagnosis not present

## 2021-08-15 ENCOUNTER — Ambulatory Visit
Admission: RE | Admit: 2021-08-15 | Discharge: 2021-08-15 | Disposition: A | Discharge status: Home / Self Care | Payer: PPO | Source: Ambulatory Visit | Attending: Radiation Oncology | Admitting: Radiation Oncology

## 2021-08-15 DIAGNOSIS — C541 Malignant neoplasm of endometrium: Secondary | ICD-10-CM | POA: Diagnosis not present

## 2021-08-15 DIAGNOSIS — Z510 Encounter for antineoplastic radiation therapy: Secondary | ICD-10-CM | POA: Diagnosis not present

## 2021-08-16 ENCOUNTER — Ambulatory Visit
Admission: RE | Admit: 2021-08-16 | Discharge: 2021-08-16 | Disposition: A | Discharge status: Home / Self Care | Payer: PPO | Source: Ambulatory Visit | Attending: Radiation Oncology | Admitting: Radiation Oncology

## 2021-08-16 DIAGNOSIS — Z03818 Encounter for observation for suspected exposure to other biological agents ruled out: Secondary | ICD-10-CM | POA: Diagnosis not present

## 2021-08-16 DIAGNOSIS — C541 Malignant neoplasm of endometrium: Secondary | ICD-10-CM | POA: Diagnosis not present

## 2021-08-16 DIAGNOSIS — Z510 Encounter for antineoplastic radiation therapy: Secondary | ICD-10-CM | POA: Diagnosis not present

## 2021-08-16 DIAGNOSIS — Z20822 Contact with and (suspected) exposure to covid-19: Secondary | ICD-10-CM | POA: Diagnosis not present

## 2021-08-19 ENCOUNTER — Ambulatory Visit
Admission: RE | Admit: 2021-08-19 | Discharge: 2021-08-19 | Disposition: A | Discharge status: Home / Self Care | Payer: PPO | Source: Ambulatory Visit | Attending: Radiation Oncology | Admitting: Radiation Oncology

## 2021-08-19 ENCOUNTER — Other Ambulatory Visit: Payer: Self-pay | Admitting: *Deleted

## 2021-08-19 DIAGNOSIS — C541 Malignant neoplasm of endometrium: Secondary | ICD-10-CM | POA: Diagnosis not present

## 2021-08-19 DIAGNOSIS — Z510 Encounter for antineoplastic radiation therapy: Secondary | ICD-10-CM | POA: Diagnosis not present

## 2021-08-20 ENCOUNTER — Ambulatory Visit
Admission: RE | Admit: 2021-08-20 | Discharge: 2021-08-20 | Disposition: A | Discharge status: Home / Self Care | Payer: PPO | Source: Ambulatory Visit | Attending: Radiation Oncology | Admitting: Radiation Oncology

## 2021-08-20 DIAGNOSIS — Z510 Encounter for antineoplastic radiation therapy: Secondary | ICD-10-CM | POA: Diagnosis not present

## 2021-08-20 DIAGNOSIS — C541 Malignant neoplasm of endometrium: Secondary | ICD-10-CM | POA: Diagnosis not present

## 2021-08-21 ENCOUNTER — Ambulatory Visit
Admission: RE | Admit: 2021-08-21 | Discharge: 2021-08-21 | Disposition: A | Discharge status: Home / Self Care | Payer: PPO | Source: Ambulatory Visit | Attending: Radiation Oncology | Admitting: Radiation Oncology

## 2021-08-21 DIAGNOSIS — C541 Malignant neoplasm of endometrium: Secondary | ICD-10-CM | POA: Diagnosis not present

## 2021-08-21 DIAGNOSIS — Z510 Encounter for antineoplastic radiation therapy: Secondary | ICD-10-CM | POA: Diagnosis not present

## 2021-08-22 ENCOUNTER — Ambulatory Visit
Admission: RE | Admit: 2021-08-22 | Discharge: 2021-08-22 | Disposition: A | Discharge status: Home / Self Care | Payer: PPO | Source: Ambulatory Visit | Attending: Radiation Oncology | Admitting: Radiation Oncology

## 2021-08-22 DIAGNOSIS — C541 Malignant neoplasm of endometrium: Secondary | ICD-10-CM | POA: Diagnosis not present

## 2021-08-22 DIAGNOSIS — Z510 Encounter for antineoplastic radiation therapy: Secondary | ICD-10-CM | POA: Diagnosis not present

## 2021-08-23 ENCOUNTER — Ambulatory Visit
Admission: RE | Admit: 2021-08-23 | Discharge: 2021-08-23 | Disposition: A | Discharge status: Home / Self Care | Payer: PPO | Source: Ambulatory Visit | Attending: Radiation Oncology | Admitting: Radiation Oncology

## 2021-08-23 DIAGNOSIS — Z510 Encounter for antineoplastic radiation therapy: Secondary | ICD-10-CM | POA: Diagnosis not present

## 2021-08-23 DIAGNOSIS — C541 Malignant neoplasm of endometrium: Secondary | ICD-10-CM | POA: Diagnosis not present

## 2021-08-26 ENCOUNTER — Ambulatory Visit
Admission: RE | Admit: 2021-08-26 | Discharge: 2021-08-26 | Disposition: A | Discharge status: Home / Self Care | Payer: PPO | Source: Ambulatory Visit | Attending: Radiation Oncology | Admitting: Radiation Oncology

## 2021-08-26 DIAGNOSIS — C541 Malignant neoplasm of endometrium: Secondary | ICD-10-CM | POA: Diagnosis not present

## 2021-08-26 DIAGNOSIS — Z510 Encounter for antineoplastic radiation therapy: Secondary | ICD-10-CM | POA: Diagnosis not present

## 2021-08-27 ENCOUNTER — Ambulatory Visit
Admission: RE | Admit: 2021-08-27 | Discharge: 2021-08-27 | Disposition: A | Discharge status: Home / Self Care | Payer: PPO | Source: Ambulatory Visit | Attending: Radiation Oncology | Admitting: Radiation Oncology

## 2021-08-27 DIAGNOSIS — Z510 Encounter for antineoplastic radiation therapy: Secondary | ICD-10-CM | POA: Diagnosis not present

## 2021-08-27 DIAGNOSIS — C541 Malignant neoplasm of endometrium: Secondary | ICD-10-CM | POA: Diagnosis not present

## 2021-08-28 ENCOUNTER — Ambulatory Visit
Admission: RE | Admit: 2021-08-28 | Discharge: 2021-08-28 | Disposition: A | Discharge status: Home / Self Care | Payer: PPO | Source: Ambulatory Visit | Attending: Radiation Oncology | Admitting: Radiation Oncology

## 2021-08-28 ENCOUNTER — Other Ambulatory Visit: Payer: Self-pay | Admitting: *Deleted

## 2021-08-28 DIAGNOSIS — C541 Malignant neoplasm of endometrium: Secondary | ICD-10-CM | POA: Insufficient documentation

## 2021-08-28 DIAGNOSIS — Z510 Encounter for antineoplastic radiation therapy: Secondary | ICD-10-CM | POA: Diagnosis not present

## 2021-08-29 ENCOUNTER — Ambulatory Visit
Admission: RE | Admit: 2021-08-29 | Discharge: 2021-08-29 | Disposition: A | Discharge status: Home / Self Care | Payer: PPO | Source: Ambulatory Visit | Attending: Radiation Oncology | Admitting: Radiation Oncology

## 2021-08-29 ENCOUNTER — Other Ambulatory Visit: Payer: Self-pay | Admitting: *Deleted

## 2021-08-29 DIAGNOSIS — C541 Malignant neoplasm of endometrium: Secondary | ICD-10-CM | POA: Diagnosis not present

## 2021-08-29 DIAGNOSIS — Z510 Encounter for antineoplastic radiation therapy: Secondary | ICD-10-CM | POA: Diagnosis not present

## 2021-08-30 ENCOUNTER — Ambulatory Visit
Admission: RE | Admit: 2021-08-30 | Discharge: 2021-08-30 | Disposition: A | Discharge status: Home / Self Care | Payer: PPO | Source: Ambulatory Visit | Attending: Radiation Oncology | Admitting: Radiation Oncology

## 2021-08-30 DIAGNOSIS — C541 Malignant neoplasm of endometrium: Secondary | ICD-10-CM | POA: Diagnosis not present

## 2021-08-30 DIAGNOSIS — Z510 Encounter for antineoplastic radiation therapy: Secondary | ICD-10-CM | POA: Diagnosis not present

## 2021-09-03 DIAGNOSIS — H903 Sensorineural hearing loss, bilateral: Secondary | ICD-10-CM | POA: Diagnosis not present

## 2021-09-03 DIAGNOSIS — H6123 Impacted cerumen, bilateral: Secondary | ICD-10-CM | POA: Diagnosis not present

## 2021-09-10 ENCOUNTER — Ambulatory Visit
Admission: RE | Admit: 2021-09-10 | Discharge: 2021-09-10 | Disposition: A | Discharge status: Home / Self Care | Payer: PPO | Source: Ambulatory Visit | Attending: Radiation Oncology | Admitting: Radiation Oncology

## 2021-09-10 ENCOUNTER — Other Ambulatory Visit: Payer: Self-pay

## 2021-09-10 DIAGNOSIS — C55 Malignant neoplasm of uterus, part unspecified: Secondary | ICD-10-CM | POA: Diagnosis not present

## 2021-09-10 DIAGNOSIS — K5730 Diverticulosis of large intestine without perforation or abscess without bleeding: Secondary | ICD-10-CM | POA: Diagnosis not present

## 2021-09-10 DIAGNOSIS — C541 Malignant neoplasm of endometrium: Secondary | ICD-10-CM | POA: Insufficient documentation

## 2021-09-11 ENCOUNTER — Other Ambulatory Visit (INDEPENDENT_AMBULATORY_CARE_PROVIDER_SITE_OTHER): Payer: PPO

## 2021-09-11 DIAGNOSIS — Z7901 Long term (current) use of anticoagulants: Secondary | ICD-10-CM | POA: Diagnosis not present

## 2021-09-11 LAB — PROTIME-INR
INR: 2.5 ratio — ABNORMAL HIGH (ref 0.8–1.0)
Prothrombin Time: 26 s — ABNORMAL HIGH (ref 9.6–13.1)

## 2021-09-12 ENCOUNTER — Other Ambulatory Visit: Payer: Self-pay

## 2021-09-12 NOTE — Progress Notes (Signed)
Protime-INR order has been placed for recheck in 4 wks.  ?

## 2021-09-18 ENCOUNTER — Inpatient Hospital Stay: Payer: PPO | Attending: Obstetrics and Gynecology | Admitting: Obstetrics and Gynecology

## 2021-09-18 ENCOUNTER — Other Ambulatory Visit: Payer: Self-pay

## 2021-09-18 ENCOUNTER — Inpatient Hospital Stay: Payer: PPO

## 2021-09-18 ENCOUNTER — Inpatient Hospital Stay (HOSPITAL_BASED_OUTPATIENT_CLINIC_OR_DEPARTMENT_OTHER): Payer: PPO | Admitting: Hospice and Palliative Medicine

## 2021-09-18 ENCOUNTER — Telehealth: Payer: Self-pay | Admitting: *Deleted

## 2021-09-18 VITALS — BP 148/53 | HR 73 | Temp 96.6°F | Resp 20 | Ht 65.0 in | Wt 187.5 lb

## 2021-09-18 DIAGNOSIS — N183 Chronic kidney disease, stage 3 unspecified: Secondary | ICD-10-CM | POA: Insufficient documentation

## 2021-09-18 DIAGNOSIS — F320 Major depressive disorder, single episode, mild: Secondary | ICD-10-CM | POA: Diagnosis not present

## 2021-09-18 DIAGNOSIS — C541 Malignant neoplasm of endometrium: Secondary | ICD-10-CM | POA: Diagnosis not present

## 2021-09-18 DIAGNOSIS — E039 Hypothyroidism, unspecified: Secondary | ICD-10-CM | POA: Insufficient documentation

## 2021-09-18 DIAGNOSIS — Z923 Personal history of irradiation: Secondary | ICD-10-CM | POA: Insufficient documentation

## 2021-09-18 DIAGNOSIS — Z515 Encounter for palliative care: Secondary | ICD-10-CM | POA: Diagnosis not present

## 2021-09-18 DIAGNOSIS — Z86711 Personal history of pulmonary embolism: Secondary | ICD-10-CM | POA: Insufficient documentation

## 2021-09-18 DIAGNOSIS — M810 Age-related osteoporosis without current pathological fracture: Secondary | ICD-10-CM | POA: Diagnosis not present

## 2021-09-18 DIAGNOSIS — I129 Hypertensive chronic kidney disease with stage 1 through stage 4 chronic kidney disease, or unspecified chronic kidney disease: Secondary | ICD-10-CM | POA: Insufficient documentation

## 2021-09-18 DIAGNOSIS — R630 Anorexia: Secondary | ICD-10-CM | POA: Insufficient documentation

## 2021-09-18 DIAGNOSIS — Z79890 Hormone replacement therapy (postmenopausal): Secondary | ICD-10-CM | POA: Insufficient documentation

## 2021-09-18 DIAGNOSIS — Z7901 Long term (current) use of anticoagulants: Secondary | ICD-10-CM | POA: Diagnosis not present

## 2021-09-18 DIAGNOSIS — R110 Nausea: Secondary | ICD-10-CM | POA: Diagnosis not present

## 2021-09-18 DIAGNOSIS — R531 Weakness: Secondary | ICD-10-CM | POA: Diagnosis not present

## 2021-09-18 DIAGNOSIS — E785 Hyperlipidemia, unspecified: Secondary | ICD-10-CM | POA: Diagnosis not present

## 2021-09-18 DIAGNOSIS — Z79899 Other long term (current) drug therapy: Secondary | ICD-10-CM | POA: Insufficient documentation

## 2021-09-18 DIAGNOSIS — R5383 Other fatigue: Secondary | ICD-10-CM | POA: Insufficient documentation

## 2021-09-18 DIAGNOSIS — R197 Diarrhea, unspecified: Secondary | ICD-10-CM | POA: Insufficient documentation

## 2021-09-18 DIAGNOSIS — I499 Cardiac arrhythmia, unspecified: Secondary | ICD-10-CM | POA: Diagnosis not present

## 2021-09-18 DIAGNOSIS — E860 Dehydration: Secondary | ICD-10-CM

## 2021-09-18 DIAGNOSIS — R140 Abdominal distension (gaseous): Secondary | ICD-10-CM | POA: Diagnosis not present

## 2021-09-18 DIAGNOSIS — R600 Localized edema: Secondary | ICD-10-CM | POA: Diagnosis not present

## 2021-09-18 LAB — URINALYSIS, COMPLETE (UACMP) WITH MICROSCOPIC
Bilirubin Urine: NEGATIVE
Glucose, UA: NEGATIVE mg/dL
Ketones, ur: NEGATIVE mg/dL
Nitrite: NEGATIVE
Protein, ur: 30 mg/dL — AB
Specific Gravity, Urine: 1.016 (ref 1.005–1.030)
WBC, UA: >50 WBC/hpf — ABNORMAL HIGH (ref 0–5)
pH: 5 (ref 5.0–8.0)

## 2021-09-18 LAB — CBC WITH DIFFERENTIAL/PLATELET
Abs Immature Granulocytes: 0.03 10*3/uL (ref 0.00–0.07)
Basophils Absolute: 0 10*3/uL (ref 0.0–0.1)
Basophils Relative: 1 %
Eosinophils Absolute: 0.1 10*3/uL (ref 0.0–0.5)
Eosinophils Relative: 1 %
HCT: 36.5 % (ref 36.0–46.0)
Hemoglobin: 11.7 g/dL — ABNORMAL LOW (ref 12.0–15.0)
Immature Granulocytes: 0 %
Lymphocytes Relative: 5 %
Lymphs Abs: 0.4 10*3/uL — ABNORMAL LOW (ref 0.7–4.0)
MCH: 29.6 pg (ref 26.0–34.0)
MCHC: 32.1 g/dL (ref 30.0–36.0)
MCV: 92.4 fL (ref 80.0–100.0)
Monocytes Absolute: 0.8 10*3/uL (ref 0.1–1.0)
Monocytes Relative: 10 %
Neutro Abs: 6.2 10*3/uL (ref 1.7–7.7)
Neutrophils Relative %: 83 %
Platelets: 133 10*3/uL — ABNORMAL LOW (ref 150–400)
RBC: 3.95 MIL/uL (ref 3.87–5.11)
RDW: 14.6 % (ref 11.5–15.5)
WBC: 7.5 10*3/uL (ref 4.0–10.5)
nRBC: 0 % (ref 0.0–0.2)

## 2021-09-18 LAB — COMPREHENSIVE METABOLIC PANEL
ALT: 6 U/L (ref 0–44)
AST: 22 U/L (ref 15–41)
Albumin: 3.6 g/dL (ref 3.5–5.0)
Alkaline Phosphatase: 71 U/L (ref 38–126)
Anion gap: 7 (ref 5–15)
BUN: 21 mg/dL (ref 8–23)
CO2: 26 mmol/L (ref 22–32)
Calcium: 8.2 mg/dL — ABNORMAL LOW (ref 8.9–10.3)
Chloride: 102 mmol/L (ref 98–111)
Creatinine, Ser: 1.56 mg/dL — ABNORMAL HIGH (ref 0.44–1.00)
GFR, Estimated: 31 mL/min — ABNORMAL LOW (ref >60)
Glucose, Bld: 122 mg/dL — ABNORMAL HIGH (ref 70–99)
Potassium: 5.1 mmol/L (ref 3.5–5.1)
Sodium: 135 mmol/L (ref 135–145)
Total Bilirubin: 0.7 mg/dL (ref 0.3–1.2)
Total Protein: 7.1 g/dL (ref 6.5–8.1)

## 2021-09-18 LAB — MAGNESIUM: Magnesium: 2.2 mg/dL (ref 1.7–2.4)

## 2021-09-18 MED ORDER — ONDANSETRON HCL 4 MG PO TABS: 4.0000 mg | ORAL_TABLET | Freq: Three times a day (TID) | ORAL | 2 refills | Status: AC | PRN

## 2021-09-18 MED ORDER — SODIUM CHLORIDE 0.9 % IV SOLN
INTRAVENOUS | Status: DC
  Filled 2021-09-18 (×2): qty 250

## 2021-09-18 MED ORDER — SODIUM CHLORIDE 0.9 % IV SOLN
INTRAVENOUS | Status: DC
  Filled 2021-09-18: qty 250

## 2021-09-18 MED ORDER — PROCHLORPERAZINE MALEATE 10 MG PO TABS: 10.0000 mg | ORAL_TABLET | Freq: Three times a day (TID) | ORAL | 0 refills | Status: AC | PRN

## 2021-09-18 MED ORDER — OYSTER SHELL CALCIUM/D3 500-5 MG-MCG PO TABS: 1.0000 | ORAL_TABLET | Freq: Two times a day (BID) | ORAL | 3 refills | Status: AC

## 2021-09-18 NOTE — Progress Notes (Signed)
Gynecologic Oncology Interval Visit  ? ?Referring Provider: ?Adrian Prows, MD ? ?Chief Concern: endometrial adenocarcinoma, endometrioid type, FIGO grade 3 ? ?Subjective:  ?Whitney Simmons is a 88 y.o. female who is seen in consultation from Dr. Gilman Schmidt for endometrial adenocarcinoma, endometrioid type, FIGO grade 1, s/p Mirena IUD, with worsening to Grade 3, who returns to clinic for follow up.  ? ?She completed external beam radiation therapy.  ?Pelvis - 45.00 of 45.00Gy, 25 of 25 fractions completed 08/30/2021  ? ?She has not seen Dr. Christel Mormon yet. She has several complaints nausea, decreased appetite, diarrhea (not adequately controlled with imodium), fatigue, weakness, SOB, cough, abdominal bloating, leg swelling, and vaginal spotting. The vaginal bleeding has improved since she started radiation.  ? ?[name] accompanied her to her visit today.  ? ?Gynecologic Oncology History:  ?Whitney Simmons is a pleasant female who is seen in consultation from Dr. Gilman Schmidt for endometrial adenocarcinoma, endometrioid type, FIGO grade 1, s/p Mirena IUD.  ? ?She initially presented with postmenopausal bleeding. Work up included ultrasound, D&C, Myosure/Hysteroscopy. ? ?09/05/2019 Pelvic ultrasound ?Findings:  ?The uterus is anteverted and measures 8.3 x 5.0 x 3.9 cm. ?Echo texture is heterogenous without evidence of focal masses. ?The Endometrium measures 5.2 mm. ?There is an echogenic mass within the endometrium measuring 22.2 x 15.2 x 21.0 mm. Minimal blood flow is seen within.  ?  ?Bilateral ovaries are not visualized.  ?Survey of the adnexa demonstrates no adnexal masses. ?There is no free fluid in the cul de sac. ?  ?Impression: ?1. There is an echogenic mass within the endometrial canal.  ?2. The endometrial lining is slightly thick for a postmenopausal woman. ?3. Neither ovary is visualized.  ? ? ?10/18/2019 D&C, Myosure/Hysteroscopy ?Exam under anesthesia revealed  9 cm uterus with bilateral adnexa without masses or  fullness. Hysteroscopy revealed an anterior uterine mass with irregular contour, not consistent with fibroid or polyp. Mass obscured the tubal ostia. Normal appearing endocervical canal. ? ?A. ENDOMETRIUM, CURETTAGE:  ?- ENDOMETRIAL ADENOCARCINOMA, ENDOMETRIOID TYPE, FIGO GRADE 1.  ?- BACKGROUND ATYPICAL HYPERPLASIA / EIN.  ? ?11/08/19 CT C/A/P WO Contrast did not show evidence of metastatic disease or other acute findings.  ? ?11/09/19 She had IUD placed with Dr. Gilman Schmidt  ? ?11/13/2019 After the IUD she was diagnosed with bilateral pulmonary embolism ?Chest CT: Partially occlusive segmental and subsegmental bilateral pulmonary emboli as described above within the right middle lobe, right lower lobe, left upper lobe and left lower lobes. No evidence of right ?ventricular heart strain. ? ? 03/07/20. EMBx ?A. ENDOMETRIUM; PIPELLE BIOPSY:  ?- RESIDUAL ENDOMETRIAL ADENOCARCINOMA.  ?- BACKGROUND ENDOMETRIUM WITH PROGESTIN EFFECT. ? ?Adding norethindrone was discussed. Due to VTE history she declined. She opted for continued medical management and close surveillance.  ? ?Endometrial biopsy taken at last visit on 07/11/2020  ?A.  ENDOMETRIUM; PIPELLE BIOPSY:  ?- RESIDUAL ENDOMETRIAL ADENOCARCINOMA.  ?- BACKGROUND ENDOMETRIUM WITH PROGESTIN EFFECT.  ? ?She has a history of bilateral subsegmental pulmonary embolism and is currently on coumadin.  ? ?She has a strong support system and is often accompanied to clinic with her daughter Whitney Simmons who leaves near [place], [place].  ? ?We previously discussed options for persistent endometrial cancer including starting norethindrone, and radiation therapy. We previously discussed probability of complete response at 12 months is ~45% based on FEMME trial. Upon further discussion she really does not want radiation. Her focus is on quality of life and she asked about prognosis and how long it takes the cancer  to progress to adjacent structures. She wants to only consider additional  treatment if she has symptomatic progressive disease. Plan to follow up every 6 months to ensure IUD strings still in place and only obtain EMBx if she has symptoms.  ? ? ?05/29/21 represented with increased bleeding and pelvic cramping/abdominal pain for 10 days.  ?A. ENDOMETRIUM; BIOPSY:  ?- ENDOMETRIOID ENDOMETRIAL ADENOCARCINOMA, FIGO GRADE 3 ? ?Previously, we had discussed options for treatment of endometrial cancer and she declined radiation. She expressed desire for quality of life.  ? ?07/08/2021- CT C/A/P WO contrast ?Findings were negative for metastatic disease however, limited in view of noncontrast study.  ? ?We reviewed treatment options. She is not a candidate for surgery. She opted for radiation therapy and we referred her to Dr. Baruch Gouty.  ? ? ?Problem List: ?Patient Active Problem List  ? Diagnosis Date Noted  ? Depression, major, single episode, mild ([place]) 05/31/2020  ? HIT (heparin-induced thrombocytopenia) 12/05/2019  ? Pseudothrombocytopenia 12/04/2019  ? History of pulmonary embolism 11/13/2019  ? IUD (intrauterine device) in place 11/13/2019  ? Endometrial cancer, grade I ([place]) 10/25/2019  ? Difficulty hearing 12/06/2018  ? Bruising 05/31/2018  ? Lower extremity edema 09/22/2017  ? Gait instability 09/22/2017  ? Nevus 05/29/2017  ? Callus of foot 01/05/2017  ? Thrombocytopenia ([place]) 11/06/2015  ? Osteoporosis 03/07/2015  ? Glaucoma 05/12/2014  ? History of breast cancer 02/13/2014  ? IBS (irritable bowel syndrome) 12/09/2013  ? Palpitations 09/01/2012  ? Chronic kidney disease, stage III (moderate) ([place]) 08/18/2012  ? Anxiety 08/02/2012  ? Hypothyroidism 07/30/2012  ? Hyperlipidemia 07/30/2012  ? Hypertension 07/30/2012  ? ? ?Past Medical History: ?Past Medical History:  ?Diagnosis Date  ? Bilateral pulmonary embolism ([place]) 10/2019  ? Breast microcalcification, mammographic 01/31/2014  ? Cancer ([place]) 02/15/2014  ? 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 Declined postoperative  radiation and antiestrogen therapy.  ? Edema   ? Endometrial cancer ([place])   ? Hyperlipidemia   ? Hypertension   ? Hypothyroidism   ? IBS (irritable bowel syndrome)   ? Irregular heart beat   ? stage III kidney disease   ? Thyroid disease   ? s/p radioactive iodine ablation 30 years ago  ? ? ?Past Surgical History: ?Past Surgical History:  ?Procedure Laterality Date  ? BREAST LUMPECTOMY Left 2015  ? DUCTAL CARCINOMA IN SITU WITH CALCIFICATIONS  ? BREAST SURGERY Left 02-15-14  ? wide excision  ? CATARACT EXTRACTION    ? CHOLECYSTECTOMY    ? HYSTEROSCOPY WITH D & C N/A 10/18/2019  ? Procedure: DILATATION AND CURETTAGE /HYSTEROSCOPY;  Surgeon: Homero Fellers, MD;  Location: [facility] ORS;  Service: Gynecology;  Laterality: N/A;  ? TOE AMPUTATION    ? left 5th toe  ? ? ?Past Gynecologic History:  ?Menarche: 5 ?Menses regular: She does not remember her menstrual h/o or when she underwent menopause. ?History of Abnormal pap: No ?Last pap: Unknown ? ?Mammogram 03/2019 ? ?OB History:  ?OB History  ?Gravida Para Term Preterm AB Living  ?2         2  ?SAB IAB Ectopic Multiple Live Births  ?           ?  ?# Outcome Date GA Lbr Len/2nd Weight Sex Delivery Anes PTL Lv  ?2 Gravida           ?1 Gravida           ? ? ?Family History: ?Family History  ?Problem Relation Age of  Onset  ? Heart disease Mother   ? Heart disease Father   ? Heart attack Father   ? Heart disease Maternal Aunt   ? Heart disease Brother   ? AAA (abdominal aortic aneurysm) Other   ? Stroke Maternal Aunt   ? Breast cancer Neg Hx   ? ? ?Social History: ?Social History  ? ?Socioeconomic History  ? Marital status: Married  ?  Spouse name: Not on file  ? Number of children: Not on file  ? Years of education: Not on file  ? Highest education level: Not on file  ?Occupational History  ? Not on file  ?Tobacco Use  ? Smoking status: Never  ? Smokeless tobacco: Never  ?Vaping Use  ? Vaping Use: Never used  ?Substance and Sexual Activity  ? Alcohol use: No  ? Drug use: No   ? Sexual activity: Not Currently  ?  Birth control/protection: Post-menopausal  ?Other Topics Concern  ? Not on file  ?Social History Narrative  ? Lives in [place]. From [place]. Has

## 2021-09-18 NOTE — Telephone Encounter (Signed)
-----   Message from Secundino Ginger sent at 09/18/2021 12:45 PM EDT ----- ?Regarding: [place] ?[place] for Ondansetron sent to chart. This patient last saw Dr Mike Gip [facility]) doctor. Currently has seen [employer] and [place]. ? ?

## 2021-09-18 NOTE — Progress Notes (Signed)
Symptom Management Clinic [facility] at [facility] [phone] [phone]  Patient Care Team: Glori Luis, MD as PCP - General (Family Medicine) Lemar Livings, Merrily Pew, MD (General Surgery) Benita Gutter, RN as Oncology Nurse Navigator Rosey Bath, MD (Inactive) as Referring Physician (Hematology and Oncology)   Name of the patient: Whitney Simmons  [id number]  Apr 10, 1928   Date of visit: 09/18/21  Reason for Consult: [address] is a 88 y.o. female with multiple medical problems including with nonoperable endometrial adenocarcinoma with persistent and progressive uterine bleeding.  CT scan of the abdomen and pelvis showed no definitive findings of metastatic disease.  She was referred for RT.  Patient was seen today by Dr. Sonia Side with GYN oncology.  Patient complained of nausea, decreased appetite, and diarrhea. There was concern for clinical dehydration.  Patient was an add-on to [facility] for supportive care/fluids and to address symptomatic complaints.  Patient tells me that she has felt fatigued since completing radiation treatments.  She has had intermittent nausea without vomiting and had an episode of diarrhea last night but none since.  She denies fever or chills.  No pain.  No respiratory symptoms.  Denies any neurologic complaints. Denies recent fevers or illnesses. Denies any easy bleeding or bruising. Reports good appetite and denies weight loss. Denies chest pain. Denies urinary complaints. Patient offers no further specific complaints today.  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 Declined postoperative radiation and antiestrogen therapy.   Edema    Endometrial cancer (HCC)    Hyperlipidemia    Hypertension     Hypothyroidism    IBS (irritable bowel syndrome)    Irregular heart beat    stage III kidney disease    Thyroid disease    s/p radioactive iodine ablation 30 years ago    PAST SURGICAL HISTORY:  Past Surgical History:  Procedure Laterality Date   BREAST LUMPECTOMY Left 2015   DUCTAL CARCINOMA IN SITU WITH CALCIFICATIONS   BREAST SURGERY Left 02-15-14   wide excision   CATARACT EXTRACTION     CHOLECYSTECTOMY     HYSTEROSCOPY WITH D & C N/A 10/18/2019   Procedure: DILATATION AND CURETTAGE /HYSTEROSCOPY;  Surgeon: Natale Milch, MD;  Location: [facility] ORS;  Service: Gynecology;  Laterality: N/A;   TOE AMPUTATION     left 5th toe    HEMATOLOGY/ONCOLOGY HISTORY:  Oncology History   No history exists.    ALLERGIES:  is allergic to levaquin [levofloxacin in d5w] and sulfa antibiotics.  MEDICATIONS:  Current Outpatient Medications  Medication Sig Dispense Refill   ALPRAZolam (XANAX) 0.25 MG tablet Take 1 tablet (0.25 mg total) by mouth daily as needed for anxiety. Take 45 minutes prior to radiation if needed 30 tablet 0   furosemide (LASIX) 20 MG tablet TAKE 1 TABLET(20 MG) BY MOUTH DAILY 90 tablet 2   latanoprost (XALATAN) 0.005 % ophthalmic solution Place 1 drop into both eyes at bedtime.      levothyroxine (SYNTHROID) 88 MCG tablet TAKE 1 TABLET BY MOUTH EVERY DAY BEFORE BREAKFAST 90 tablet 3   losartan (COZAAR) 25 MG tablet Take 1 tablet (25 mg total) by mouth daily. 90 tablet 1   nebivolol (BYSTOLIC) 5 MG tablet TAKE 1 TABLET(5 MG) BY MOUTH DAILY 90 tablet 2   warfarin (  COUMADIN) 1 MG tablet Take 2 mg (2 tablets) PO on Thurdays, and 1 mg (1 tablet) PO Monday, Tuesday, Wednesday, Friday, Saturday, and Sunday 90 tablet 2   No current facility-administered medications for this visit.   Facility-Administered Medications Ordered in Other Visits  Medication Dose Route Frequency Provider Last Rate Last Admin   0.9 %  sodium chloride infusion   Intravenous Continuous Thecla Forgione,  Daryl Eastern, NP 999 mL/hr at 09/18/21 1117 New Bag at 09/18/21 1117    VITAL SIGNS: There were no vitals taken for this visit. There were no vitals filed for this visit.  Estimated body mass index is 31.2 kg/m as calculated from the following:   Height as of an earlier encounter on 09/18/21: 5\' 5"  (1.651 m).   Weight as of an earlier encounter on 09/18/21: 187 lb 8 oz (85 kg).  LABS: CBC:    Component Value Date/Time   WBC 7.5 09/18/2021 1115   HGB 11.7 (L) 09/18/2021 1115   HGB 12.8 02/14/2013 1618   HCT 36.5 09/18/2021 1115   HCT 38.2 02/14/2013 1618   PLT 133 (L) 09/18/2021 1115   PLT 256 02/14/2013 1618   MCV 92.4 09/18/2021 1115   MCV 88 02/14/2013 1618   NEUTROABS 6.2 09/18/2021 1115   NEUTROABS 6.5 02/14/2013 1618   LYMPHSABS 0.4 (L) 09/18/2021 1115   LYMPHSABS 1.7 02/14/2013 1618   MONOABS 0.8 09/18/2021 1115   MONOABS 0.9 02/14/2013 1618   EOSABS 0.1 09/18/2021 1115   EOSABS 0.1 02/14/2013 1618   BASOSABS 0.0 09/18/2021 1115   BASOSABS 0.1 02/14/2013 1618   Comprehensive Metabolic Panel:    Component Value Date/Time   NA 140 12/05/2020 1445   K 4.0 12/05/2020 1445   CL 104 12/05/2020 1445   CO2 27 12/05/2020 1445   BUN 22 12/05/2020 1445   CREATININE 1.44 (H) 12/05/2020 1445   CREATININE 1.09 12/09/2013 1619   GLUCOSE 127 (H) 12/05/2020 1445   CALCIUM 8.6 12/05/2020 1445   AST 13 (L) 03/08/2020 1311   ALT 7 03/08/2020 1311   ALKPHOS 62 03/08/2020 1311   BILITOT 0.4 03/08/2020 1311   PROT 7.0 03/08/2020 1311   ALBUMIN 3.6 03/08/2020 1311    RADIOGRAPHIC STUDIES: CT Abdomen Pelvis Wo Contrast  Result Date: 09/11/2021 CLINICAL DATA:  Uterine/cervical cancer, assess treatment response. * onc * EXAM: CT ABDOMEN AND PELVIS WITHOUT CONTRAST TECHNIQUE: Multidetector CT imaging of the abdomen and pelvis was performed following the standard protocol without IV contrast. RADIATION DOSE REDUCTION: This exam was performed according to the departmental  dose-optimization program which includes automated exposure control, adjustment of the mA and/or kV according to patient size and/or use of iterative reconstruction technique. COMPARISON:  July 08, 2021. FINDINGS: Lower chest: Bibasilar atelectasis versus scarring. Fat necrosis in the left breast appears similar prior. Hepatobiliary: No suspicious hepatic lesion on this noncontrast examination. Gallbladder surgically absent. No biliary ductal dilation. Pancreas: No pancreatic ductal dilation or evidence of acute inflammation. Spleen: No splenomegaly or focal splenic lesion. Adrenals/Urinary Tract: Bilateral adrenal glands appear normal. No hydronephrosis. No nephrolithiasis. Urinary bladder is nondistended limiting evaluation. Stomach/Bowel: No radiopaque enteric contrast material was administered. Stomach is moderately distended with ingested material and gas without abnormal wall thickening. No pathologic dilation of small or large bowel. Left-sided colonic diverticulosis without findings of acute diverticulitis. Vascular/Lymphatic: Aortic and branch vessel atherosclerosis without abdominal aortic aneurysm. No pathologically enlarged abdominal or pelvic lymph nodes. Reproductive: Intrauterine device appears appropriate in positioning. No suspicious adnexal masses. Other: No  significant abdominopelvic free fluid. No discrete omental or peritoneal nodularity. Musculoskeletal: Multilevel degenerative changes spine. No aggressive lytic or blastic lesion of bone. IMPRESSION: 1. No evidence of metastatic disease within the abdomen or pelvis on this noncontrast examination. 2. Left-sided colonic diverticulosis without findings of acute diverticulitis. 3.  Aortic Atherosclerosis (ICD10-I70.0). Electronically Signed   By: Maudry Mayhew M.D.   On: 09/11/2021 11:21    PERFORMANCE STATUS (ECOG) : 1 - Symptomatic but completely ambulatory  Review of Systems Unless otherwise noted, a complete review of systems is  negative.  Physical Exam General: NAD Cardiovascular: regular rate and rhythm Pulmonary: clear ant fields Abdomen: soft, nontender, + bowel sounds GU: no suprapubic tenderness Extremities: no edema, no joint deformities Skin: no rashes Neurological: Weakness but otherwise nonfocal  Assessment and Plan- Patient is a 88 y.o. female with nonoperable endometrial adenocarcinoma with persistent and progressive uterine bleeding.  CT scan of the abdomen and pelvis showed no definitive findings of metastatic disease.  She was referred for RT.  Fatigue and nausea are likely secondary to radiation effects.  Episode of diarrhea could also be secondary to radiation.  No clear evidence of infectious etiology.  Exam is relatively benign.  We will proceed with IV fluids and supportive care.  Send Rx for antiemetics.  Discussed importance of maintaining oral hydration.  Labs suggestive of dehydration and mild hypocalcemia.  We will start patient on Os-Cal with vitamin D.  We will bring patient back to clinic for recheck labs/fluids/possible calcium later this week.    Patient expressed understanding and was in agreement with this plan. She also understands that She can call clinic at any time with any questions, concerns, or complaints.   Thank you for allowing me to participate in the care of this very pleasant patient.   Time Total: 15 minutes  Visit consisted of counseling and education dealing with the complex and emotionally intense issues of symptom management in the setting of serious illness.Greater than 50%  of this time was spent counseling and coordinating care related to the above assessment and plan.  Signed by: Laurette Schimke, PhD, NP-C

## 2021-09-18 NOTE — Telephone Encounter (Signed)
PA submitted for the zofran. ? ?Your information has been submitted to [employer]. To check for an updated outcome later, reopen this PA request from your dashboard. ? ?If [employer] has not responded to your request within 24 hours, contact [facility] at [phone]. If you think there may be a problem with your PA request, use our live chat feature at the bottom right. ?

## 2021-09-18 NOTE — Progress Notes (Signed)
Add-on to [facility] for weakness, fatigue, nausea, and low fluid intake post-radiation. Pt's daughter concerned about dehydration. Pt states that she had been constipated, but had one instance of a lage, loose BM last night.  ?

## 2021-09-19 ENCOUNTER — Other Ambulatory Visit: Payer: Self-pay | Admitting: Family Medicine

## 2021-09-19 DIAGNOSIS — E039 Hypothyroidism, unspecified: Secondary | ICD-10-CM

## 2021-09-19 NOTE — Telephone Encounter (Signed)
NOTICE OF APPROVAL- [facility] for Zofran sent to chart ?

## 2021-09-20 ENCOUNTER — Inpatient Hospital Stay: Payer: PPO

## 2021-09-25 ENCOUNTER — Telehealth: Payer: Self-pay | Admitting: *Deleted

## 2021-09-25 MED ORDER — CEPHALEXIN 500 MG PO CAPS: 500.0000 mg | ORAL_CAPSULE | Freq: Two times a day (BID) | ORAL | 0 refills | Status: DC

## 2021-09-25 NOTE — Telephone Encounter (Signed)
I spoke with daughter by phone.  She felt patient did significantly better after IV fluids last week but has slowly declined with oral intake and has subsequently had worsening fatigue.  I do agree that UA appears consistent with UTI and I will send her over a prescription for an antibiotic.  I offered clinic eval for repeat labs/fluids.  Daughter plans to speak with patient and will let us know if they want to come in. ?

## 2021-09-25 NOTE — Telephone Encounter (Signed)
Daughter called stating that she sees the UA results and it looks like patient has UTI. She states she cannot get patient to drink enough fluids and she is asking if she needs to come for more IV fluids and if she needs treatment for possible infection. Please advise ?

## 2021-09-30 ENCOUNTER — Ambulatory Visit
Admission: RE | Admit: 2021-09-30 | Discharge: 2021-09-30 | Disposition: A | Discharge status: Home / Self Care | Payer: PPO | Source: Ambulatory Visit | Attending: Radiation Oncology | Admitting: Radiation Oncology

## 2021-09-30 ENCOUNTER — Encounter: Payer: Self-pay | Admitting: Radiation Oncology

## 2021-09-30 VITALS — BP 158/62 | HR 75 | Resp 18 | Ht 65.0 in | Wt 189.6 lb

## 2021-09-30 DIAGNOSIS — C541 Malignant neoplasm of endometrium: Secondary | ICD-10-CM

## 2021-10-14 ENCOUNTER — Telehealth: Payer: Self-pay

## 2021-10-14 NOTE — Telephone Encounter (Signed)
Per Dr. Christel Mormon "Patient declined to reschedule for consult, after researching and learning how at high risk she would be she would rather not undergo radiation." ?

## 2021-10-16 ENCOUNTER — Other Ambulatory Visit (INDEPENDENT_AMBULATORY_CARE_PROVIDER_SITE_OTHER): Payer: PPO

## 2021-10-16 DIAGNOSIS — Z7901 Long term (current) use of anticoagulants: Secondary | ICD-10-CM | POA: Diagnosis not present

## 2021-10-16 LAB — PROTIME-INR
INR: 2.5 ratio — ABNORMAL HIGH (ref 0.8–1.0)
Prothrombin Time: 26.3 s — ABNORMAL HIGH (ref 9.6–13.1)

## 2021-10-21 ENCOUNTER — Telehealth: Payer: Self-pay | Admitting: Family Medicine

## 2021-10-21 NOTE — Telephone Encounter (Signed)
Patient called, lab note was read and INR appointment made to check INR in one month. ?

## 2021-10-21 NOTE — Telephone Encounter (Signed)
Pt would like to be called regarding lab results ?

## 2021-10-22 ENCOUNTER — Telehealth: Payer: Self-pay | Admitting: *Deleted

## 2021-10-22 NOTE — Telephone Encounter (Signed)
Left voicemail to return call for lab results: ? ?Please let the patient know her INR is acceptable.  She should continue with her current Coumadin regimen.  We can recheck in 1 month. ?

## 2021-11-13 DIAGNOSIS — H401121 Primary open-angle glaucoma, left eye, mild stage: Secondary | ICD-10-CM | POA: Diagnosis not present

## 2021-11-20 ENCOUNTER — Other Ambulatory Visit: Payer: PPO

## 2021-11-26 ENCOUNTER — Telehealth: Payer: Self-pay | Admitting: Family Medicine

## 2021-11-26 NOTE — Telephone Encounter (Signed)
There is not an indication to check her urine given that she does not have symptoms.

## 2021-11-26 NOTE — Telephone Encounter (Signed)
I called and spoke with the patient and informed her  that the provider would not check her urine without symptoms and she understood.  I also have the patient scheduled for her next month INR check.  Darean Rote,cma

## 2021-11-26 NOTE — Telephone Encounter (Signed)
Patient is coming tomorrow for INR check she would like her urine checked too. Patient states she has not symptoms, just wants it checked. Patient scheduled a follow up with Dr Caryl Bis for July.

## 2021-11-27 ENCOUNTER — Other Ambulatory Visit (INDEPENDENT_AMBULATORY_CARE_PROVIDER_SITE_OTHER): Payer: PPO

## 2021-11-27 DIAGNOSIS — Z7901 Long term (current) use of anticoagulants: Secondary | ICD-10-CM | POA: Diagnosis not present

## 2021-11-27 LAB — PROTIME-INR
INR: 3.1 ratio — ABNORMAL HIGH (ref 0.8–1.0)
Prothrombin Time: 31.9 s — ABNORMAL HIGH (ref 9.6–13.1)

## 2021-12-13 ENCOUNTER — Telehealth: Payer: Self-pay | Admitting: Family Medicine

## 2021-12-13 NOTE — Telephone Encounter (Signed)
Patient has a lab appt 12/18/21 for INR, there are no orders in.

## 2021-12-15 ENCOUNTER — Other Ambulatory Visit: Payer: Self-pay | Admitting: Family

## 2021-12-15 DIAGNOSIS — Z7901 Long term (current) use of anticoagulants: Secondary | ICD-10-CM

## 2021-12-18 ENCOUNTER — Other Ambulatory Visit (INDEPENDENT_AMBULATORY_CARE_PROVIDER_SITE_OTHER): Payer: PPO

## 2021-12-18 DIAGNOSIS — Z7901 Long term (current) use of anticoagulants: Secondary | ICD-10-CM

## 2021-12-18 LAB — PROTIME-INR
INR: 2 ratio — ABNORMAL HIGH (ref 0.8–1.0)
Prothrombin Time: 20.7 s — ABNORMAL HIGH (ref 9.6–13.1)

## 2021-12-19 ENCOUNTER — Other Ambulatory Visit: Payer: Self-pay

## 2021-12-19 DIAGNOSIS — Z7901 Long term (current) use of anticoagulants: Secondary | ICD-10-CM

## 2021-12-25 ENCOUNTER — Other Ambulatory Visit: Payer: PPO

## 2022-01-08 ENCOUNTER — Ambulatory Visit (INDEPENDENT_AMBULATORY_CARE_PROVIDER_SITE_OTHER): Payer: PPO | Admitting: Family Medicine

## 2022-01-08 ENCOUNTER — Encounter: Payer: Self-pay | Admitting: Family Medicine

## 2022-01-08 VITALS — BP 120/80 | HR 61 | Temp 97.9°F | Ht 65.0 in | Wt 187.8 lb

## 2022-01-08 DIAGNOSIS — E785 Hyperlipidemia, unspecified: Secondary | ICD-10-CM

## 2022-01-08 DIAGNOSIS — C541 Malignant neoplasm of endometrium: Secondary | ICD-10-CM

## 2022-01-08 DIAGNOSIS — I10 Essential (primary) hypertension: Secondary | ICD-10-CM | POA: Diagnosis not present

## 2022-01-08 DIAGNOSIS — Z86711 Personal history of pulmonary embolism: Secondary | ICD-10-CM

## 2022-01-08 DIAGNOSIS — R600 Localized edema: Secondary | ICD-10-CM

## 2022-01-08 DIAGNOSIS — F419 Anxiety disorder, unspecified: Secondary | ICD-10-CM | POA: Diagnosis not present

## 2022-01-08 DIAGNOSIS — E039 Hypothyroidism, unspecified: Secondary | ICD-10-CM

## 2022-01-08 NOTE — Progress Notes (Signed)
Tommi Rumps, MD Phone: [phone]  Whitney Simmons is a 88 y.o. female who presents today for f/u.  HYPERTENSION Disease Monitoring Home BP Monitoring not checking Chest pain- no    Dyspnea- no Medications Compliance-  taking losartan, bystolic, Lasix.   Edema- yes, bilateral legs, chronic, stable, goes down overnight BMET    Component Value Date/Time   NA 135 09/18/2021 1115   K 5.1 09/18/2021 1115   CL 102 09/18/2021 1115   CO2 26 09/18/2021 1115   GLUCOSE 122 (H) 09/18/2021 1115   BUN 21 09/18/2021 1115   CREATININE 1.56 (H) 09/18/2021 1115   CREATININE 1.09 12/09/2013 1619   CALCIUM 8.2 (L) 09/18/2021 1115   GFRNONAA 31 (L) 09/18/2021 1115   GFRAA 40 (L) 03/08/2020 1311   History of PE: notes no new swelling. Has been taking coumadin daily.  Anxiety/depression: comes and goes. Not on any medications. Notes the lord takes care of her. No SI.  Endometrial cancer: patient underwent radiation earlier this year and notes this helped with her bleeding though the bleeding and cramping have recurred. She has deferred further treatment at this time and has follow-up with [facility] later this month to discuss her options.   Social History   Tobacco Use  Smoking Status Never  Smokeless Tobacco Never    Current Outpatient Medications on File Prior to Visit  Medication Sig Dispense Refill   ALPRAZolam (XANAX) 0.25 MG tablet Take 1 tablet (0.25 mg total) by mouth daily as needed for anxiety. Take 45 minutes prior to radiation if needed 30 tablet 0   calcium-vitamin D (OSCAL WITH D) 500-5 MG-MCG tablet Take 1 tablet by mouth 2 (two) times daily. 60 tablet 3   furosemide (LASIX) 20 MG tablet TAKE 1 TABLET(20 MG) BY MOUTH DAILY 90 tablet 2   latanoprost (XALATAN) 0.005 % ophthalmic solution Place 1 drop into both eyes at bedtime.      levothyroxine (SYNTHROID) 88 MCG tablet TAKE 1 TABLET BY MOUTH EVERY DAY BEFORE BREAKFAST 90 tablet 3   losartan (COZAAR) 25 MG tablet Take 1  tablet (25 mg total) by mouth daily. 90 tablet 1   nebivolol (BYSTOLIC) 5 MG tablet TAKE 1 TABLET(5 MG) BY MOUTH DAILY 90 tablet 2   ondansetron (ZOFRAN) 4 MG tablet Take 1 tablet (4 mg total) by mouth every 8 (eight) hours as needed for nausea or vomiting. 20 tablet 2   prochlorperazine (COMPAZINE) 10 MG tablet Take 1 tablet (10 mg total) by mouth every 8 (eight) hours as needed for nausea or vomiting. 30 tablet 0   warfarin (COUMADIN) 1 MG tablet Take 2 mg (2 tablets) PO on Thurdays, and 1 mg (1 tablet) PO Monday, Tuesday, Wednesday, Friday, Saturday, and Sunday 90 tablet 2   No current facility-administered medications on file prior to visit.     ROS see history of present illness  Objective  Physical Exam Vitals:   01/08/22 1005  BP: 120/80  Pulse: 61  Temp: 97.9 F (36.6 C)  SpO2: 97%    BP Readings from Last 3 Encounters:  01/08/22 120/80  09/30/21 (!) 158/62  09/18/21 (!) 148/53   Wt Readings from Last 3 Encounters:  01/08/22 187 lb 12.8 oz (85.2 kg)  09/30/21 189 lb 9.6 oz (86 kg)  09/18/21 187 lb 8 oz (85 kg)    Physical Exam Constitutional:      General: She is not in acute distress.    Appearance: She is not diaphoretic.  Cardiovascular:  Rate and Rhythm: Normal rate and regular rhythm.     Heart sounds: Normal heart sounds.  Pulmonary:     Effort: Pulmonary effort is normal.     Breath sounds: Normal breath sounds.  Musculoskeletal:     Comments: Nonpitting edema bilateral lower legs  Skin:    General: Skin is warm and dry.  Neurological:     Mental Status: She is alert.      Assessment/Plan: Please see individual problem list.  Problem List Items Addressed This Visit     Anxiety (Chronic)    Some intermittent anxiety and depression.  She is not on medication and does not want anything for this.  She will monitor.      Endometrial cancer, grade I ([place]) (Chronic)    She will keep her appointment with [facility] later this month.       History of pulmonary embolism (Chronic)    Continue Coumadin 1 mg daily.  Check INR next week.      Relevant Orders   CBC   Hyperlipidemia (Chronic)    No indication for treatment at her age with her cancer diagnosis.      Hypertension - Primary (Chronic)    Adequately controlled.  She will continue Bystolic 5 mg daily, Lasix 20 mg daily, and losartan 25 mg daily.  Check labs next week.      Relevant Orders   Comp Met (CMET)   Hypothyroidism (Chronic)    Check TSH next week.  Continue Synthroid 88 mcg daily.      Relevant Orders   TSH   Lower extremity edema    Chronic issue likely related to venous insufficiency.  She will elevate her legs and monitor.       Return in about 1 week (around 01/15/2022) for Labs, 3 months PCP.   Tommi Rumps, MD [place]

## 2022-01-08 NOTE — Assessment & Plan Note (Signed)
Continue Coumadin 1 mg daily.  Check INR next week.

## 2022-01-08 NOTE — Assessment & Plan Note (Signed)
Adequately controlled.  She will continue Bystolic 5 mg daily, Lasix 20 mg daily, and losartan 25 mg daily.  Check labs next week.

## 2022-01-08 NOTE — Assessment & Plan Note (Signed)
Some intermittent anxiety and depression.  She is not on medication and does not want anything for this.  She will monitor.

## 2022-01-08 NOTE — Assessment & Plan Note (Signed)
She will keep her appointment with [facility] later this month.

## 2022-01-08 NOTE — Assessment & Plan Note (Signed)
Check TSH next week.  Continue Synthroid 88 mcg daily.

## 2022-01-08 NOTE — Assessment & Plan Note (Signed)
Chronic issue likely related to venous insufficiency.  She will elevate her legs and monitor.

## 2022-01-08 NOTE — Assessment & Plan Note (Signed)
No indication for treatment at her age with her cancer diagnosis.

## 2022-01-15 ENCOUNTER — Other Ambulatory Visit (INDEPENDENT_AMBULATORY_CARE_PROVIDER_SITE_OTHER): Payer: PPO

## 2022-01-15 DIAGNOSIS — I10 Essential (primary) hypertension: Secondary | ICD-10-CM | POA: Diagnosis not present

## 2022-01-15 DIAGNOSIS — E039 Hypothyroidism, unspecified: Secondary | ICD-10-CM

## 2022-01-15 DIAGNOSIS — Z86711 Personal history of pulmonary embolism: Secondary | ICD-10-CM | POA: Diagnosis not present

## 2022-01-15 DIAGNOSIS — Z7901 Long term (current) use of anticoagulants: Secondary | ICD-10-CM

## 2022-01-15 LAB — COMPREHENSIVE METABOLIC PANEL
ALT: 4 U/L (ref 0–35)
AST: 10 U/L (ref 0–37)
Albumin: 3.6 g/dL (ref 3.5–5.2)
Alkaline Phosphatase: 75 U/L (ref 39–117)
BUN: 16 mg/dL (ref 6–23)
CO2: 25 mEq/L (ref 19–32)
Calcium: 8 mg/dL — ABNORMAL LOW (ref 8.4–10.5)
Chloride: 103 mEq/L (ref 96–112)
Creatinine, Ser: 1.33 mg/dL — ABNORMAL HIGH (ref 0.40–1.20)
GFR: 34.83 mL/min — ABNORMAL LOW (ref >60.00)
Glucose, Bld: 115 mg/dL — ABNORMAL HIGH (ref 70–99)
Potassium: 4 mEq/L (ref 3.5–5.1)
Sodium: 137 mEq/L (ref 135–145)
Total Bilirubin: 0.5 mg/dL (ref 0.2–1.2)
Total Protein: 6.2 g/dL (ref 6.0–8.3)

## 2022-01-15 LAB — TSH: TSH: 2.94 u[IU]/mL (ref 0.35–5.50)

## 2022-01-15 LAB — CBC
HCT: 35.3 % — ABNORMAL LOW (ref 36.0–46.0)
Hemoglobin: 11.4 g/dL — ABNORMAL LOW (ref 12.0–15.0)
MCHC: 32.2 g/dL (ref 30.0–36.0)
MCV: 93.3 fl (ref 78.0–100.0)
Platelets: 166 10*3/uL (ref 150.0–400.0)
RBC: 3.78 Mil/uL — ABNORMAL LOW (ref 3.87–5.11)
RDW: 15 % (ref 11.5–15.5)
WBC: 5.8 10*3/uL (ref 4.0–10.5)

## 2022-01-15 LAB — PROTIME-INR
INR: 2 ratio — ABNORMAL HIGH (ref 0.8–1.0)
Prothrombin Time: 20.6 s — ABNORMAL HIGH (ref 9.6–13.1)

## 2022-01-16 ENCOUNTER — Telehealth: Payer: Self-pay

## 2022-01-16 NOTE — Telephone Encounter (Signed)
LVM to call back to office to get results

## 2022-01-22 ENCOUNTER — Inpatient Hospital Stay: Payer: PPO | Attending: Obstetrics and Gynecology | Admitting: Obstetrics and Gynecology

## 2022-01-22 VITALS — BP 156/62 | HR 64 | Temp 97.8°F | Ht 65.0 in | Wt 191.2 lb

## 2022-01-22 DIAGNOSIS — N898 Other specified noninflammatory disorders of vagina: Secondary | ICD-10-CM | POA: Insufficient documentation

## 2022-01-22 DIAGNOSIS — R110 Nausea: Secondary | ICD-10-CM | POA: Diagnosis not present

## 2022-01-22 DIAGNOSIS — Z923 Personal history of irradiation: Secondary | ICD-10-CM | POA: Insufficient documentation

## 2022-01-22 DIAGNOSIS — N939 Abnormal uterine and vaginal bleeding, unspecified: Secondary | ICD-10-CM | POA: Insufficient documentation

## 2022-01-22 DIAGNOSIS — R0602 Shortness of breath: Secondary | ICD-10-CM | POA: Diagnosis not present

## 2022-01-22 DIAGNOSIS — C541 Malignant neoplasm of endometrium: Secondary | ICD-10-CM

## 2022-01-22 DIAGNOSIS — R600 Localized edema: Secondary | ICD-10-CM | POA: Diagnosis not present

## 2022-01-22 DIAGNOSIS — Z7901 Long term (current) use of anticoagulants: Secondary | ICD-10-CM | POA: Insufficient documentation

## 2022-01-22 DIAGNOSIS — R531 Weakness: Secondary | ICD-10-CM | POA: Insufficient documentation

## 2022-01-22 DIAGNOSIS — F32A Depression, unspecified: Secondary | ICD-10-CM | POA: Insufficient documentation

## 2022-01-22 DIAGNOSIS — Z86711 Personal history of pulmonary embolism: Secondary | ICD-10-CM | POA: Insufficient documentation

## 2022-01-22 DIAGNOSIS — Z79890 Hormone replacement therapy (postmenopausal): Secondary | ICD-10-CM | POA: Diagnosis not present

## 2022-01-22 DIAGNOSIS — K5730 Diverticulosis of large intestine without perforation or abscess without bleeding: Secondary | ICD-10-CM | POA: Diagnosis not present

## 2022-01-22 DIAGNOSIS — R5383 Other fatigue: Secondary | ICD-10-CM | POA: Insufficient documentation

## 2022-01-22 DIAGNOSIS — Z7189 Other specified counseling: Secondary | ICD-10-CM

## 2022-01-22 NOTE — Progress Notes (Signed)
Has had diarrhea today.  Discuss IUD. Having thin discharge, comes and goes. At times is bloody.  DMV form.

## 2022-01-22 NOTE — Progress Notes (Signed)
Gynecologic Oncology Interval Visit   Referring Provider: [place] Prows, MD  Chief Concern: endometrial adenocarcinoma, endometrioid type, FIGO grade 3  Subjective:  Whitney Simmons is a 88 y.o. female who is seen in consultation from Dr. Gilman Schmidt for endometrial adenocarcinoma, endometrioid type, FIGO grade 1, s/p Mirena IUD, with worsening to Grade 3, who returns to clinic for follow up.   She completed external beam radiation therapy.  Pelvis - 45.00 of 45.00Gy, 25 of 25 fractions completed 08/30/2021   She was referred to Dr. Christel Mormon for brachytherapy but declined additional radiation. She continues to have vaginal bleeding and vaginal discharge. Would like to have IUD removed. She continues to feel tired, weak, short of breath. Has nausea, diarrhea that interferes with quality of life and oral intake. Ongoing leg swelling. Complaints of depression.   Whitney Simmons accompanies her today.    Gynecologic Oncology History:  Whitney Simmons is a pleasant female who is seen in consultation from Dr. Gilman Schmidt for endometrial adenocarcinoma, endometrioid type, FIGO grade 1, s/p Mirena IUD.   She initially presented with postmenopausal bleeding. Work up included ultrasound, D&C, Myosure/Hysteroscopy.  09/05/2019 Pelvic ultrasound Findings:  The uterus is anteverted and measures 8.3 x 5.0 x 3.9 cm. Echo texture is heterogenous without evidence of focal masses. The Endometrium measures 5.2 mm. There is an echogenic mass within the endometrium measuring 22.2 x 15.2 x 21.0 mm. Minimal blood flow is seen within.    Bilateral ovaries are not visualized.  Survey of the adnexa demonstrates no adnexal masses. There is no free fluid in the cul de sac.   Impression: 1. There is an echogenic mass within the endometrial canal.  2. The endometrial lining is slightly thick for a postmenopausal woman. 3. Neither ovary is visualized.    10/18/2019 D&C, Myosure/Hysteroscopy Exam under anesthesia revealed  9  cm uterus with bilateral adnexa without masses or fullness. Hysteroscopy revealed an anterior uterine mass with irregular contour, not consistent with fibroid or polyp. Mass obscured the tubal ostia. Normal appearing endocervical canal.  A. ENDOMETRIUM, CURETTAGE:  - ENDOMETRIAL ADENOCARCINOMA, ENDOMETRIOID TYPE, FIGO GRADE 1.  - BACKGROUND ATYPICAL HYPERPLASIA / EIN.   11/08/19 CT C/A/P WO Contrast did not show evidence of metastatic disease or other acute findings.   11/09/19 She had IUD placed with Dr. Gilman Schmidt   11/13/2019 After the IUD she was diagnosed with bilateral pulmonary embolism Chest CT: Partially occlusive segmental and subsegmental bilateral pulmonary emboli as described above within the right middle lobe, right lower lobe, left upper lobe and left lower lobes. No evidence of right ventricular heart strain.   03/07/20. EMBx A. ENDOMETRIUM; PIPELLE BIOPSY:  - RESIDUAL ENDOMETRIAL ADENOCARCINOMA.  - BACKGROUND ENDOMETRIUM WITH PROGESTIN EFFECT.  Adding norethindrone was discussed. Due to VTE history she declined. She opted for continued medical management and close surveillance.   Endometrial biopsy taken at last visit on 07/11/2020  A.  ENDOMETRIUM; PIPELLE BIOPSY:  - RESIDUAL ENDOMETRIAL ADENOCARCINOMA.  - BACKGROUND ENDOMETRIUM WITH PROGESTIN EFFECT.   She has a history of bilateral subsegmental pulmonary embolism and is currently on coumadin.   She has a strong support system and is often accompanied to clinic with her daughter Whitney Simmons who leaves near [place], [place].   We previously discussed options for persistent endometrial cancer including starting norethindrone, and radiation therapy. We previously discussed probability of complete response at 12 months is ~45% based on FEMME trial. Upon further discussion she really does not want radiation. Her focus is on quality of life  and she asked about prognosis and how long it takes the cancer to progress to adjacent  structures. She wants to only consider additional treatment if she has symptomatic progressive disease. Plan to follow up every 6 months to ensure IUD strings still in place and only obtain EMBx if she has symptoms.    05/29/21 represented with increased bleeding and pelvic cramping/abdominal pain for 10 days.  A. ENDOMETRIUM; BIOPSY:  - ENDOMETRIOID ENDOMETRIAL ADENOCARCINOMA, FIGO GRADE 3  Previously, we had discussed options for treatment of endometrial cancer and she declined radiation. She expressed desire for quality of life.   07/08/2021- CT C/A/P WO contrast Findings were negative for metastatic disease however, limited in view of noncontrast study.   We reviewed treatment options. She is not a candidate for surgery. She opted for radiation therapy and we referred her to Dr. Baruch Gouty.    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   History of pulmonary embolism 11/13/2019   IUD (intrauterine device) in place 11/13/2019   Endometrial cancer, grade I ([place]) 10/25/2019   Difficulty hearing 12/06/2018   Bruising 05/31/2018   Lower extremity edema 09/22/2017   Gait instability 09/22/2017   Nevus 05/29/2017   Callus of foot 01/05/2017   Thrombocytopenia ([place]) 11/06/2015   Osteoporosis 03/07/2015   Glaucoma 05/12/2014   History of breast cancer 02/13/2014   IBS (irritable bowel syndrome) 12/09/2013   Palpitations 09/01/2012   Chronic kidney disease, stage III (moderate) ([place]) 08/18/2012   Anxiety 08/02/2012   Hypothyroidism 07/30/2012   Hyperlipidemia 07/30/2012   Hypertension 07/30/2012    Past Medical History: Past Medical History:  Diagnosis Date   Bilateral pulmonary embolism ([place]) 10/2019   Breast microcalcification, mammographic 01/31/2014   Cancer ([facility] [place]) 02/15/2014   DCIS left breast, 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 Declined postoperative radiation and antiestrogen therapy.   Edema    Endometrial cancer ([facility])    Hyperlipidemia    Hypertension    Hypothyroidism    IBS (irritable bowel syndrome)    Irregular heart beat    stage III kidney disease    Thyroid disease    s/p radioactive iodine ablation 30 years ago    Past Surgical History: Past Surgical History:  Procedure Laterality Date   BREAST LUMPECTOMY Left 2015   DUCTAL CARCINOMA IN SITU WITH CALCIFICATIONS   BREAST SURGERY Left 02-15-14   wide excision   CATARACT EXTRACTION     CHOLECYSTECTOMY     HYSTEROSCOPY WITH D & C N/A 10/18/2019   Procedure: DILATATION AND CURETTAGE /HYSTEROSCOPY;  Surgeon: Homero Fellers, MD;  Location: [facility] ORS;  Service: Gynecology;  Laterality: N/A;   TOE AMPUTATION     left 5th toe    Past Gynecologic History:  Menarche: 15 Menses regular: She does not remember her menstrual h/o or when she underwent menopause. History of Abnormal pap: No Last pap: Unknown  Mammogram 03/2019  OB History:  OB History  Gravida Para Term Preterm AB Living  2         2  SAB IAB Ectopic Multiple Live Births               # Outcome Date GA Lbr Len/2nd Weight Sex Delivery Anes PTL Lv  2 [place]           1 [place]  Family History: Family History  Problem Relation Age of Onset   Heart disease Mother    Heart disease Father    Heart attack Father    Heart disease Maternal Aunt    Heart disease Brother    AAA (abdominal aortic aneurysm) Other    Stroke Maternal Aunt    Breast cancer Neg Hx     Social History: Social History   Socioeconomic History   Marital status: Married    Spouse name: Not on file   Number of children: Not on file   Years of education: Not on file   Highest education level: Not on file  Occupational History   Not on file  Tobacco Use   Smoking status: Never   Smokeless tobacco: Never  Vaping Use   Vaping Use: Never used   Substance and Sexual Activity   Alcohol use: No   Drug use: No   Sexual activity: Not Currently    Birth control/protection: Post-menopausal  Other Topics Concern   Not on file  Social History Narrative   Lives in [place] at [place]. From [place]. Has daughter.      Work- Retired Psychologist, prison and probation services Strain: [place]  (12/21/2018)   Overall Financial Resource Strain (CARDIA)    Difficulty of Paying Living Expenses: Not hard at all  Food Insecurity: No Food Insecurity (12/21/2018)   Hunger Vital Sign    Worried About Running Out of Food in the Last Year: Never true    [place] in the Last Year: Never true  Transportation Needs: No Transportation Needs (12/21/2018)   PRAPARE - Hydrologist (Medical): No    Lack of Transportation (Non-Medical): No  Physical Activity: Not on file  Stress: No Stress Concern Present (12/21/2018)   [place]    Feeling of Stress : Not at all  Social Connections: Not on file  Intimate Partner Violence: Not At Risk (12/21/2018)   Humiliation, Afraid, Rape, and Kick questionnaire    Fear of Current or Ex-Partner: No    Emotionally Abused: No    Physically Abused: No    Sexually Abused: No    Allergies: Allergies  Allergen Reactions   Levaquin [Levofloxacin In D5w] Other (See Comments)    hallucinations   Sulfa Antibiotics     Hallucinations      Review of Systems General:  fatigue, weak, sob Skin: no complaints Eyes: no complaints HEENT: no complaints Breasts: no complaints Pulmonary: no complaints Cardiac: no complaints Gastrointestinal: diarrhea, constipation Genitourinary/Sexual: no complaints Ob/Gyn: vaginal discharge and bleeding Musculoskeletal: no complaints Hematology: no complaints Neurologic/Psych: no complaints  Objective:  Physical Examination:  BP (!) 156/62 (BP  Location: Right Arm, Patient Position: Sitting, Cuff Size: Normal)   Pulse 64   Temp 97.8 F (36.6 C) (Tympanic)   Ht _0  (1.651 m)   Wt 191 lb 3.2 oz (86.7 kg)   SpO2 98%   BMI 31.82 kg/m    ECOG Performance Status: 2 - Symptomatic, <50% confined to bed  GENERAL: Patient is a well appearing female in no acute distress. Frail.  HEENT:  atraumatic normocephalic. Pupil R > L. Hard of hearing.  LUNGS:  Clear to auscultation bilaterally.   HEART:  Regular rate and rhythm.  ABDOMEN:  Soft, nontender, slightly distended. Positive, normoactive bowel sounds. No masses, ascites.  EXTREMITIES:  1-2+ peripheral edema; symmetrical NEURO:  Nonfocal. Well oriented.  Appropriate affect. Hard of hearing  Pelvic: deferred  Lab Review Lab Results  Component Value Date   WBC 5.8 01/15/2022   HGB 11.4 (L) 01/15/2022   HCT 35.3 (L) 01/15/2022   MCV 93.3 01/15/2022   PLT 166.0 01/15/2022     Chemistry      Component Value Date/Time   NA 137 01/15/2022 1037   K 4.0 01/15/2022 1037   CL 103 01/15/2022 1037   CO2 25 01/15/2022 1037   BUN 16 01/15/2022 1037   CREATININE 1.33 (H) 01/15/2022 1037   CREATININE 1.09 12/09/2013 1619      Component Value Date/Time   CALCIUM 8.0 (L) 01/15/2022 1037   ALKPHOS 75 01/15/2022 1037   AST 10 01/15/2022 1037   ALT 4 01/15/2022 1037   BILITOT 0.5 01/15/2022 1037       Radiologic Imaging: 07/08/2020 EXAM: CT CHEST, ABDOMEN AND PELVIS WITHOUT CONTRAST   TECHNIQUE: Multidetector CT imaging of the chest, abdomen and pelvis was performed following the standard protocol without IV contrast.   COMPARISON:  CT abdomen pelvis 11/08/2019, CT abdomen pelvis 03/07/2016   FINDINGS: CT CHEST FINDINGS   Cardiovascular:   Normal heart size. No significant pericardial effusion. The thoracic aorta is normal in caliber. No atherosclerotic plaque of the thoracic aorta. No coronary artery calcifications.   Mediastinum/Nodes: No gross hilar adenopathy,  noting limited sensitivity for the detection of hilar adenopathy on this noncontrast study. no enlarged mediastinal or axillary lymph nodes. Thyroid gland, trachea, and esophagus demonstrate no significant findings.   Lungs/Pleura: Elevated left hemidiaphragm with left base atelectasis. No focal consolidation. Couple of stable right middle lobe punctate calcified densities (3:60). No pulmonary mass. No pleural effusion. No pneumothorax.   Musculoskeletal:   Similar-appearing left breast fat necrosis.   No suspicious lytic or blastic osseous lesions. No acute displaced fracture. Grade 1 anterolisthesis of C7 on T1. Degenerative changes of the visualized cervical spine. Grade 1 anterolisthesis of C6 on C7.   CT ABDOMEN PELVIS FINDINGS   Hepatobiliary: No focal liver abnormality. Status post cholecystectomy. No biliary dilatation.   Pancreas: Diffusely atrophic. No focal lesion. Otherwise normal pancreatic contour. No surrounding inflammatory changes. No main pancreatic ductal dilatation.   Spleen: Normal in size without focal abnormality.   Adrenals/Urinary Tract:   No adrenal nodule bilaterally.   Bilateral renal cortical scarring. Bilateral renal atrophy. No nephrolithiasis and no hydronephrosis. No definite contour-deforming renal mass.   No ureterolithiasis or hydroureter.   The urinary bladder is unremarkable.   Stomach/Bowel: Stomach is within normal limits. No evidence of bowel wall thickening or dilatation. Diffuse sigmoid diverticulosis. Appendix appears normal.   Vascular/Lymphatic: No abdominal aorta or iliac aneurysm. At least moderate to severe atherosclerotic plaque of the aorta and its branches. No abdominal, pelvic, or inguinal lymphadenopathy.   Reproductive: T shaped intrauterine device noted in the expected region of the endometrial canal in grossly appropriate position. Uterus and bilateral adnexa are unremarkable.   Other: No intraperitoneal  free fluid. No intraperitoneal free gas. No organized fluid collection.   Musculoskeletal:   No abdominal wall hernia or abnormality.   Densely sclerotic lesion of the right iliac bone likely a bone island. No suspicious lytic or blastic osseous lesions. No acute displaced fracture. Multilevel degenerative changes of the spine.   IMPRESSION: 1. No definite finding of metastases in a patient with endometrial cancer; however, please note limited evaluation on this noncontrast study. 2. T-shaped intrauterine device in appropriate position. 3. Other imaging  findings of potential clinical significance: Diffuse sigmoid diverticulosis with no acute diverticulitis. Similar-appearing left breast fat necrosis.    09/10/2021 CT ABDOMEN AND PELVIS WITHOUT CONTRAST   TECHNIQUE: Multidetector CT imaging of the abdomen and pelvis was performed following the standard protocol without IV contrast.   RADIATION DOSE REDUCTION: This exam was performed according to the departmental dose-optimization program which includes automated exposure control, adjustment of the mA and/or kV according to patient size and/or use of iterative reconstruction technique.   COMPARISON:  July 08, 2021.   FINDINGS: Lower chest: Bibasilar atelectasis versus scarring. Fat necrosis in the left breast appears similar prior.   Hepatobiliary: No suspicious hepatic lesion on this noncontrast examination. Gallbladder surgically absent. No biliary ductal dilation.   Pancreas: No pancreatic ductal dilation or evidence of acute inflammation.   Spleen: No splenomegaly or focal splenic lesion.   Adrenals/Urinary Tract: Bilateral adrenal glands appear normal. No hydronephrosis. No nephrolithiasis. Urinary bladder is nondistended limiting evaluation.   Stomach/Bowel: No radiopaque enteric contrast material was administered. Stomach is moderately distended with ingested material and gas without abnormal wall thickening. No  pathologic dilation of small or large bowel. Left-sided colonic diverticulosis without findings of acute diverticulitis.   Vascular/Lymphatic: Aortic and branch vessel atherosclerosis without abdominal aortic aneurysm. No pathologically enlarged abdominal or pelvic lymph nodes.   Reproductive: Intrauterine device appears appropriate in positioning. No suspicious adnexal masses.   Other: No significant abdominopelvic free fluid. No discrete omental or peritoneal nodularity.   Musculoskeletal: Multilevel degenerative changes spine. No aggressive lytic or blastic lesion of bone.   IMPRESSION: 1. No evidence of metastatic disease within the abdomen or pelvis on this noncontrast examination. 2. Left-sided colonic diverticulosis without findings of acute diverticulitis. 3.  Aortic Atherosclerosis (ICD10-I70.0).      Assessment:  Whitney Simmons is a 88 y.o. female diagnosed with grade 1 endometrioid endometrial cancer s/p IUD 10/2019 with persistent disease on repeat EMBx. EMBx 1/22 showed persistent adenocarcinoma with progestin effect. Repeat biopsy 05/29/21 showed grade 3 endometrial cancer.  S/p external beam radiation therapy. She declined brachytherapy.   Performance status 2  History of mild azotemia, recheck CMP  Similar-appearing left breast fat necrosis.   History of PE on anticoagulation  Frailty and MET < 4  Multiple positive review of symptoms including GI symptoms and concern for dehydration.   Medical co-morbidities complicating care: Frailty; HTN; hypothyroidism; irregular heartbeat; stage III kidney disease (last creatinine 1.36).   Plan:   Problem List Items Addressed This Visit   None Visit Diagnoses     Endometrial cancer ([place])    -  Primary   Goals of care, counseling/discussion          We had a long discussion regarding goals of care. She initially was interested in having the IUD removed. The IUD is not causing any problems and she's asymptomatic. Her  bleeding has improved and she has not had any bleeding over the past few days. Overall, radiation has helped her symptoms. I expressed that given that she did not do the internal radiation she has higher risk of incompletely control disease. I recommended that she keep the IUD. Since she's not having any symptoms and her last CT scan was reassuring without evidence of disease, I reassured her. Will will keep in close contact and she'll follow up in six months or sooner if she has any concerning symptoms, which she we discussed as abdominal pain or abnormal bleeding. They're welcome to contact us at any time. She  seemed happy with this plan that we discussed with her daughter as well.  We look forward to seeing her back for close surveillance.  She has a h/o left lumpectomy. BI-RADS-2 on imaging. She was previously released from breast clinic. Imaging studies c/w fat necrosis.   Beckey Rutter, DNP, AGNP-C [place] at [facility] [phone] (clinic)  I personally had a face to face interaction and evaluated the patient jointly with the NP, Ms. Beckey Rutter.  I have reviewed her history and available records and have performed the key portions of the physical exam including abdominal exam, pelvic exam with my findings confirming those documented above by the APP.  I have discussed the case with the APP and the patient.  I agree with the above documentation, assessment and plan which was fully formulated by me.  Counseling was completed by me.   I personally saw the patient and performed a substantive portion of this encounter in conjunction with the listed APP as documented above.  [place] Gaetana Michaelis, MD .   [place] Gaetana Michaelis, MD

## 2022-02-04 NOTE — Telephone Encounter (Signed)
Patient states she had labs for her INR in July, 2023, and she usually receives a call from [place] with the results and then we schedule an appointment for her.  Patient states she also wants to verify the dose she should be taking for her warfarin (COUMADIN) 1 MG tablet.  Patient was unable to hear me.  I called patient's daughter, Whitney Simmons, and explained the situation.  [name] states it's ok for [place] to call her back at [phone], and she will relay message to patient.  [name] states originally patient was taking warfarin (COUMADIN) 1 MG tablet, one per day for six days and on the seventh day of the week she would take two.  [name] states patient told her Dr. Tommi Rumps changed her dosage to one per day for every day of the week.  [name] states she would like to verify that this is correct.

## 2022-02-05 NOTE — Telephone Encounter (Signed)
The last change we made to her warfarin dosing is as follows warfarin 2 mg on Thurdays, and 1 mg Monday, Tuesday, Wednesday, Friday, Saturday, and Sunday.  I have not advised her to deviate from that since that change was made back in December 2022.  If she is only been taking 1 mg daily please find out how long that it has been going on for.  If she has been doing that for a while then it is fine to stay on 1 mg daily as her INR has been acceptable.

## 2022-02-07 NOTE — Telephone Encounter (Signed)
I called and spoke with the daughter and informed her of the coumadin regimen and she understood patient will continue to take the 1 mg and she is scheduled for a recheck next week to evaluate.  Mackensi Mahadeo,cma

## 2022-02-10 ENCOUNTER — Telehealth: Payer: Self-pay | Admitting: Family Medicine

## 2022-02-10 DIAGNOSIS — Z7901 Long term (current) use of anticoagulants: Secondary | ICD-10-CM

## 2022-02-10 NOTE — Telephone Encounter (Signed)
Patient has a lab appt 02/12/2022, there are no orders in.

## 2022-02-10 NOTE — Addendum Note (Signed)
Addended by: Leone Haven on: 02/10/2022 12:38 PM   Modules accepted: Orders

## 2022-02-10 NOTE — Telephone Encounter (Signed)
Orders placed.

## 2022-02-12 ENCOUNTER — Other Ambulatory Visit (INDEPENDENT_AMBULATORY_CARE_PROVIDER_SITE_OTHER): Payer: PPO

## 2022-02-12 DIAGNOSIS — Z7901 Long term (current) use of anticoagulants: Secondary | ICD-10-CM

## 2022-02-12 LAB — PROTIME-INR
INR: 1.7 ratio — ABNORMAL HIGH (ref 0.8–1.0)
Prothrombin Time: 17.7 s — ABNORMAL HIGH (ref 9.6–13.1)

## 2022-02-13 ENCOUNTER — Encounter: Payer: Self-pay | Admitting: Podiatry

## 2022-02-13 ENCOUNTER — Ambulatory Visit (INDEPENDENT_AMBULATORY_CARE_PROVIDER_SITE_OTHER): Payer: PPO | Admitting: Podiatry

## 2022-02-13 DIAGNOSIS — N1831 Chronic kidney disease, stage 3a: Secondary | ICD-10-CM | POA: Diagnosis not present

## 2022-02-13 DIAGNOSIS — D696 Thrombocytopenia, unspecified: Secondary | ICD-10-CM

## 2022-02-13 DIAGNOSIS — M79674 Pain in right toe(s): Secondary | ICD-10-CM

## 2022-02-13 DIAGNOSIS — M79675 Pain in left toe(s): Secondary | ICD-10-CM

## 2022-02-13 DIAGNOSIS — B351 Tinea unguium: Secondary | ICD-10-CM

## 2022-02-13 NOTE — Progress Notes (Signed)
This patient returns to my office for at risk foot care.  This patient requires this care by a professional since this patient will be at risk due to having history of thrombi and chronic kidney disease.  This patient is unable to cut nails herself since the patient cannot reach her nails.These nails are painful walking and wearing shoes. Patient has painful corn third toe left foot. This patient presents for at risk foot care today.  General Appearance  Alert, conversant and in no acute stress.  Vascular  Dorsalis pedis and posterior tibial  pulses are  Weakly palpable  bilaterally.  Capillary return is within normal limits  bilaterally. Temperature is within normal limits  bilaterally.  Neurologic  Senn-Weinstein monofilament wire test within normal limits  bilaterally. Muscle power within normal limits bilaterally.  Nails Thick disfigured discolored nails with subungual debris  from hallux to fifth toes bilaterally. No evidence of bacterial infection or drainage bilaterally.  Orthopedic  No limitations of motion  feet .  No crepitus or effusions noted.  No bony pathology or digital deformities noted. Amputation second toe left.  Contracted fifth toe right foot.  Skin  normotropic skin with no porokeratosis noted bilaterally.  No signs of infections or ulcers noted.     Onychomycosis  Pain in right toes  Pain in left toes  Consent was obtained for treatment procedures.   Mechanical debridement of nails 1-5  Right and 1, 3-5 left.  performed with a nail nipper.  Filed with dremel without incident.    Return office visit   3 months                  Told patient to return for periodic foot care and evaluation due to potential at risk complications.   Gardiner Barefoot DPM

## 2022-02-26 ENCOUNTER — Other Ambulatory Visit (INDEPENDENT_AMBULATORY_CARE_PROVIDER_SITE_OTHER): Payer: PPO

## 2022-02-26 DIAGNOSIS — Z7901 Long term (current) use of anticoagulants: Secondary | ICD-10-CM | POA: Diagnosis not present

## 2022-02-26 LAB — PROTIME-INR
INR: 2.4 ratio — ABNORMAL HIGH (ref 0.8–1.0)
Prothrombin Time: 25.1 s — ABNORMAL HIGH (ref 9.6–13.1)

## 2022-02-27 ENCOUNTER — Other Ambulatory Visit: Payer: Self-pay

## 2022-02-27 DIAGNOSIS — Z7901 Long term (current) use of anticoagulants: Secondary | ICD-10-CM

## 2022-03-04 DIAGNOSIS — H903 Sensorineural hearing loss, bilateral: Secondary | ICD-10-CM | POA: Diagnosis not present

## 2022-03-04 DIAGNOSIS — H6123 Impacted cerumen, bilateral: Secondary | ICD-10-CM | POA: Diagnosis not present

## 2022-03-05 ENCOUNTER — Ambulatory Visit: Payer: PPO | Admitting: Radiation Oncology

## 2022-03-26 ENCOUNTER — Other Ambulatory Visit: Payer: PPO

## 2022-03-26 ENCOUNTER — Ambulatory Visit (INDEPENDENT_AMBULATORY_CARE_PROVIDER_SITE_OTHER): Payer: PPO

## 2022-03-26 DIAGNOSIS — Z23 Encounter for immunization: Secondary | ICD-10-CM

## 2022-03-26 DIAGNOSIS — Z7901 Long term (current) use of anticoagulants: Secondary | ICD-10-CM | POA: Diagnosis not present

## 2022-03-26 LAB — PROTIME-INR
INR: 3.5 ratio — ABNORMAL HIGH (ref 0.8–1.0)
Prothrombin Time: 35.1 s — ABNORMAL HIGH (ref 9.6–13.1)

## 2022-03-28 ENCOUNTER — Other Ambulatory Visit: Payer: Self-pay | Admitting: Family Medicine

## 2022-03-28 DIAGNOSIS — I2694 Multiple subsegmental pulmonary emboli without acute cor pulmonale: Secondary | ICD-10-CM

## 2022-04-02 ENCOUNTER — Telehealth: Payer: Self-pay | Admitting: *Deleted

## 2022-04-02 NOTE — Telephone Encounter (Signed)
Daughter called stating that Dr Theora Gianotti had asked if patient was hospice at last visit and she told her no, but failed to ask when she would know if patient is ready. She reports that is declining and would like to discuss possible hospice triggers with a provider. Please return her call

## 2022-04-04 ENCOUNTER — Telehealth: Payer: Self-pay | Admitting: *Deleted

## 2022-04-04 NOTE — Telephone Encounter (Signed)
Patient has agreed to see Sharion Dove, NP Palliative Care to discuss Hospice/ comfort measures Please call daughter Whitney Simmons with appointment as patient is very hard of hearing

## 2022-04-04 NOTE — Telephone Encounter (Signed)
Scheduling- please call patient's daughter with apts for [place]. Thanks.

## 2022-04-07 ENCOUNTER — Other Ambulatory Visit: Payer: Self-pay

## 2022-04-07 ENCOUNTER — Inpatient Hospital Stay: Payer: PPO | Attending: Obstetrics and Gynecology | Admitting: Hospice and Palliative Medicine

## 2022-04-07 VITALS — BP 144/58 | HR 63 | Temp 98.5°F | Resp 20 | Ht 65.0 in | Wt 189.0 lb

## 2022-04-07 DIAGNOSIS — Z66 Do not resuscitate: Secondary | ICD-10-CM | POA: Diagnosis not present

## 2022-04-07 DIAGNOSIS — C541 Malignant neoplasm of endometrium: Secondary | ICD-10-CM | POA: Insufficient documentation

## 2022-04-07 NOTE — Progress Notes (Signed)
[place] at [facility] [phone] [phone]   Name: Whitney Simmons Date: 04/07/2022 MRN: [id number]  DOB: 07/12/1927  Patient Care Team: Leone Haven, MD as PCP - General (Family Medicine) Bary Castilla, Forest Gleason, MD (General Surgery) Clent Jacks, RN as Oncology Nurse Navigator    REASON FOR CONSULTATION: Whitney DEFINO is a 88 y.o. female with multiple medical problems including endometrial adenocarcinoma status post external beam XRT.  Patient was referred for brachytherapy but declined additional radiation.  She has history of bilateral pulmonary embolism in 2021.  Patient continues to have vaginal bleeding and discharge.  Patient was seen by Beckey Rutter, NP in July 2023 at which time patient declined further treatment and instead wanted to focus on quality of life.  Patient has been having progressive fatigue and overall decline and hospice has been recommended.  Palliative care was consulted to address goals.  SOCIAL HISTORY:     reports that she has never smoked. She has never used smokeless tobacco. She reports that she does not drink alcohol and does not use drugs.  Patient lives at home in an independent living apartment.  She has a daughter from [place] who has been staying with her.  Patient is married but husband has dementia and is an a Warden/r[employer] facility.  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 Declined postoperative radiation and antiestrogen therapy.   Edema    Endometrial cancer (HCC)    Hyperlipidemia    Hypertension    Hypothyroidism    IBS (irritable bowel syndrome)    Irregular heart beat    stage III kidney disease     Thyroid disease    s/p radioactive iodine ablation 30 years ago    PAST SURGICAL HISTORY:  Past Surgical History:  Procedure Laterality Date   BREAST LUMPECTOMY Left 2015   DUCTAL CARCINOMA IN SITU WITH CALCIFICATIONS   BREAST SURGERY Left 02-15-14   wide excision   CATARACT EXTRACTION     CHOLECYSTECTOMY     HYSTEROSCOPY WITH D & C N/A 10/18/2019   Procedure: DILATATION AND CURETTAGE /HYSTEROSCOPY;  Surgeon: Homero Fellers, MD;  Location: [facility] ORS;  Service: Gynecology;  Laterality: N/A;   TOE AMPUTATION     left 5th toe    HEMATOLOGY/ONCOLOGY HISTORY:  Oncology History   No history exists.    ALLERGIES:  is allergic to levaquin [levofloxacin in d5w] and sulfa antibiotics.  MEDICATIONS:  Current Outpatient Medications  Medication Sig Dispense Refill   furosemide (LASIX) 20 MG tablet TAKE 1 TABLET(20 MG) BY MOUTH DAILY 90 tablet 2   latanoprost (XALATAN) 0.005 % ophthalmic solution Place 1 drop into both eyes at bedtime.      levothyroxine (SYNTHROID) 88 MCG tablet TAKE 1 TABLET BY MOUTH EVERY DAY BEFORE BREAKFAST 90 tablet 3   losartan (COZAAR) 25 MG tablet Take 1 tablet (25 mg total) by mouth daily. 90 tablet 1   nebivolol (BYSTOLIC) 5 MG tablet TAKE 1 TABLET(5 MG) BY MOUTH DAILY 90 tablet 2   ondansetron (ZOFRAN) 4 MG tablet Take 1 tablet (4 mg total) by mouth every 8 (eight) hours as needed for nausea or vomiting. 20 tablet 2   warfarin (COUMADIN) 1 MG  tablet TAKE 2 TABLET BY MOUTH ON THURSDAY AND 1 TABLET BY MOUTH ALL THE OTHER DAYS 90 tablet 2   calcium-vitamin D (OSCAL WITH D) 500-5 MG-MCG tablet Take 1 tablet by mouth 2 (two) times daily. (Patient not taking: Reported on 04/07/2022) 60 tablet 3   prochlorperazine (COMPAZINE) 10 MG tablet Take 1 tablet (10 mg total) by mouth every 8 (eight) hours as needed for nausea or vomiting. (Patient not taking: Reported on 04/07/2022) 30 tablet 0   No current facility-administered medications for this visit.    VITAL  SIGNS: There were no vitals taken for this visit. There were no vitals filed for this visit.  Estimated body mass index is 31.82 kg/m as calculated from the following:   Height as of 01/22/22: _0  (1.651 m).   Weight as of 01/22/22: 191 lb 3.2 oz (86.7 kg).  LABS: CBC:    Component Value Date/Time   WBC 5.8 01/15/2022 1037   HGB 11.4 (L) 01/15/2022 1037   HGB 12.8 02/14/2013 1618   HCT 35.3 (L) 01/15/2022 1037   HCT 38.2 02/14/2013 1618   PLT 166.0 01/15/2022 1037   PLT 256 02/14/2013 1618   MCV 93.3 01/15/2022 1037   MCV 88 02/14/2013 1618   NEUTROABS 6.2 09/18/2021 1115   NEUTROABS 6.5 02/14/2013 1618   LYMPHSABS 0.4 (L) 09/18/2021 1115   LYMPHSABS 1.7 02/14/2013 1618   MONOABS 0.8 09/18/2021 1115   MONOABS 0.9 02/14/2013 1618   EOSABS 0.1 09/18/2021 1115   EOSABS 0.1 02/14/2013 1618   BASOSABS 0.0 09/18/2021 1115   BASOSABS 0.1 02/14/2013 1618   Comprehensive Metabolic Panel:    Component Value Date/Time   NA 137 01/15/2022 1037   K 4.0 01/15/2022 1037   CL 103 01/15/2022 1037   CO2 25 01/15/2022 1037   BUN 16 01/15/2022 1037   CREATININE 1.33 (H) 01/15/2022 1037   CREATININE 1.09 12/09/2013 1619   GLUCOSE 115 (H) 01/15/2022 1037   CALCIUM 8.0 (L) 01/15/2022 1037   AST 10 01/15/2022 1037   ALT 4 01/15/2022 1037   ALKPHOS 75 01/15/2022 1037   BILITOT 0.5 01/15/2022 1037   PROT 6.2 01/15/2022 1037   ALBUMIN 3.6 01/15/2022 1037    RADIOGRAPHIC STUDIES: No results found.  PERFORMANCE STATUS (ECOG) : 3 - Symptomatic, >50% confined to bed  Review of Systems Unless otherwise noted, a complete review of systems is negative.  Physical Exam General: NAD Pulmonary: Unlabored Extremities: no edema, no joint deformities Skin: no rashes Neurological: Weakness but otherwise nonfocal  IMPRESSION: I met with patient and daughter to discuss goals.  Patient states clearly that she recognizes that she has an advanced cancer which will likely progress and could  potentially be the cause of her end-of-life.  Patient says that she is not interested in receiving any further cancer treatment or work-up.  Instead, she says that she is ready to die and would like for things to take their natural course.   We discussed the probable recurrence of bleeding and that a bleeding event could ultimately prove fatal.  Patient stated repeatedly that she just wanted to focus on comfort and is not interested in any interventions at this point.  We discussed the option of hospice in detail and both patient and daughter verbalized agreement with hospice involvement.  Patient reports that she is a DNR/DNI.  She has a DNR order signed on her fridge.  PLAN: -Comfort measures -Referral to hospice -DNR/DNI -Follow-up as needed   Patient expressed understanding  and was in agreement with this plan. She also understands that She can call the clinic at any time with any questions, concerns, or complaints.     Time Total: 45 minutes  Visit consisted of counseling and education dealing with the complex and emotionally intense issues of symptom management and palliative care in the setting of serious and potentially life-threatening illness.Greater than 50%  of this time was spent counseling and coordinating care related to the above assessment and plan.  Signed by: Altha Harm, PhD, NP-C

## 2022-04-08 ENCOUNTER — Telehealth: Payer: Self-pay

## 2022-04-08 NOTE — Telephone Encounter (Signed)
I called and spoke with Drue Dun and informed her that the provider stated he would be the patients attending as long as the hospice provider manages the comfort measures and they agreed and understood.  Raife Lizer,cma

## 2022-04-08 NOTE — Telephone Encounter (Signed)
Drue Dun called from [facility] to states patient is requesting Dr. Tommi Rumps to serve as patient's hospice attending.  I did let Drue Dun know that Dr. Caryl Bis will not be in the office for the rest of the day.  Kristie asked that we please call her as there is a timeframe for this.  Drue Dun states they need to have his response by the end of day tomorrow.

## 2022-04-09 ENCOUNTER — Other Ambulatory Visit (INDEPENDENT_AMBULATORY_CARE_PROVIDER_SITE_OTHER)

## 2022-04-09 DIAGNOSIS — Z7901 Long term (current) use of anticoagulants: Secondary | ICD-10-CM

## 2022-04-09 LAB — PROTIME-INR
INR: 3 ratio — ABNORMAL HIGH (ref 0.8–1.0)
Prothrombin Time: 31 s — ABNORMAL HIGH (ref 9.6–13.1)

## 2022-05-07 ENCOUNTER — Other Ambulatory Visit (INDEPENDENT_AMBULATORY_CARE_PROVIDER_SITE_OTHER)

## 2022-05-07 ENCOUNTER — Encounter: Payer: Self-pay | Admitting: Emergency Medicine

## 2022-05-07 ENCOUNTER — Telehealth: Payer: Self-pay

## 2022-05-07 ENCOUNTER — Observation Stay
Admission: EM | Admit: 2022-05-07 | Discharge: 2022-05-08 | Disposition: A | Discharge status: Hospice | Attending: Internal Medicine | Admitting: Internal Medicine

## 2022-05-07 ENCOUNTER — Other Ambulatory Visit: Payer: Self-pay

## 2022-05-07 DIAGNOSIS — R791 Abnormal coagulation profile: Secondary | ICD-10-CM | POA: Diagnosis present

## 2022-05-07 DIAGNOSIS — Z7901 Long term (current) use of anticoagulants: Secondary | ICD-10-CM

## 2022-05-07 DIAGNOSIS — N1832 Chronic kidney disease, stage 3b: Secondary | ICD-10-CM | POA: Diagnosis not present

## 2022-05-07 DIAGNOSIS — Z79899 Other long term (current) drug therapy: Secondary | ICD-10-CM | POA: Diagnosis not present

## 2022-05-07 DIAGNOSIS — E039 Hypothyroidism, unspecified: Secondary | ICD-10-CM | POA: Diagnosis present

## 2022-05-07 DIAGNOSIS — I959 Hypotension, unspecified: Secondary | ICD-10-CM | POA: Diagnosis not present

## 2022-05-07 DIAGNOSIS — D62 Acute posthemorrhagic anemia: Principal | ICD-10-CM

## 2022-05-07 DIAGNOSIS — N939 Abnormal uterine and vaginal bleeding, unspecified: Secondary | ICD-10-CM

## 2022-05-07 DIAGNOSIS — Z86711 Personal history of pulmonary embolism: Secondary | ICD-10-CM | POA: Diagnosis not present

## 2022-05-07 DIAGNOSIS — I129 Hypertensive chronic kidney disease with stage 1 through stage 4 chronic kidney disease, or unspecified chronic kidney disease: Secondary | ICD-10-CM | POA: Diagnosis not present

## 2022-05-07 DIAGNOSIS — Z853 Personal history of malignant neoplasm of breast: Secondary | ICD-10-CM | POA: Insufficient documentation

## 2022-05-07 DIAGNOSIS — N179 Acute kidney failure, unspecified: Secondary | ICD-10-CM | POA: Diagnosis present

## 2022-05-07 DIAGNOSIS — C541 Malignant neoplasm of endometrium: Secondary | ICD-10-CM | POA: Diagnosis present

## 2022-05-07 DIAGNOSIS — I10 Essential (primary) hypertension: Secondary | ICD-10-CM | POA: Diagnosis not present

## 2022-05-07 DIAGNOSIS — R0689 Other abnormalities of breathing: Secondary | ICD-10-CM | POA: Diagnosis not present

## 2022-05-07 LAB — CBC WITH DIFFERENTIAL/PLATELET
Abs Immature Granulocytes: 0.06 10*3/uL (ref 0.00–0.07)
Basophils Absolute: 0.1 10*3/uL (ref 0.0–0.1)
Basophils Relative: 1 %
Eosinophils Absolute: 0.1 10*3/uL (ref 0.0–0.5)
Eosinophils Relative: 1 %
HCT: 28.2 % — ABNORMAL LOW (ref 36.0–46.0)
Hemoglobin: 9.2 g/dL — ABNORMAL LOW (ref 12.0–15.0)
Immature Granulocytes: 1 %
Lymphocytes Relative: 7 %
Lymphs Abs: 0.7 10*3/uL (ref 0.7–4.0)
MCH: 28.7 pg (ref 26.0–34.0)
MCHC: 32.6 g/dL (ref 30.0–36.0)
MCV: 87.9 fL (ref 80.0–100.0)
Monocytes Absolute: 1.1 10*3/uL — ABNORMAL HIGH (ref 0.1–1.0)
Monocytes Relative: 11 %
Neutro Abs: 7.9 10*3/uL — ABNORMAL HIGH (ref 1.7–7.7)
Neutrophils Relative %: 79 %
Platelets: 301 10*3/uL (ref 150–400)
RBC: 3.21 MIL/uL — ABNORMAL LOW (ref 3.87–5.11)
RDW: 13.9 % (ref 11.5–15.5)
WBC: 9.9 10*3/uL (ref 4.0–10.5)
nRBC: 0 % (ref 0.0–0.2)

## 2022-05-07 LAB — TYPE AND SCREEN
ABO/RH(D): O POS
Antibody Screen: NEGATIVE

## 2022-05-07 LAB — PROTIME-INR
INR: 5.7 ratio (ref 0.8–1.0)
INR: 4.4 (ref 0.8–1.2)
Prothrombin Time: 56 s (ref 9.6–13.1)
Prothrombin Time: 41.4 seconds — ABNORMAL HIGH (ref 11.4–15.2)

## 2022-05-07 LAB — BASIC METABOLIC PANEL
Anion gap: 7 (ref 5–15)
BUN: 44 mg/dL — ABNORMAL HIGH (ref 8–23)
CO2: 25 mmol/L (ref 22–32)
Calcium: 8.3 mg/dL — ABNORMAL LOW (ref 8.9–10.3)
Chloride: 102 mmol/L (ref 98–111)
Creatinine, Ser: 3.04 mg/dL — ABNORMAL HIGH (ref 0.44–1.00)
GFR, Estimated: 14 mL/min — ABNORMAL LOW (ref >60)
Glucose, Bld: 120 mg/dL — ABNORMAL HIGH (ref 70–99)
Potassium: 4.9 mmol/L (ref 3.5–5.1)
Sodium: 134 mmol/L — ABNORMAL LOW (ref 135–145)

## 2022-05-07 LAB — HEMOGLOBIN: Hemoglobin: 8.4 g/dL — ABNORMAL LOW (ref 12.0–15.0)

## 2022-05-07 MED ORDER — SODIUM CHLORIDE 0.9 % IV BOLUS
1000.0000 mL | Freq: Once | INTRAVENOUS | Status: AC
  Administered 2022-05-07: 1000 mL via INTRAVENOUS

## 2022-05-07 MED ORDER — LEVOTHYROXINE SODIUM 88 MCG PO TABS
88.0000 ug | ORAL_TABLET | Freq: Every day | ORAL | Status: DC
  Administered 2022-05-08: 88 ug via ORAL
  Filled 2022-05-07: qty 1

## 2022-05-07 MED ORDER — ACETAMINOPHEN 325 MG PO TABS: 650.0000 mg | ORAL_TABLET | Freq: Four times a day (QID) | ORAL | Status: DC | PRN

## 2022-05-07 MED ORDER — ACETAMINOPHEN 325 MG RE SUPP: 650.0000 mg | Freq: Four times a day (QID) | RECTAL | Status: DC | PRN

## 2022-05-07 MED ORDER — SODIUM CHLORIDE 0.9 % IV SOLN: INTRAVENOUS | Status: DC

## 2022-05-07 MED ORDER — NEBIVOLOL HCL 5 MG PO TABS
5.0000 mg | ORAL_TABLET | Freq: Every day | ORAL | Status: DC
  Administered 2022-05-08: 5 mg via ORAL
  Filled 2022-05-07 (×2): qty 1

## 2022-05-07 MED ORDER — LOSARTAN POTASSIUM 50 MG PO TABS
25.0000 mg | ORAL_TABLET | Freq: Every day | ORAL | Status: DC
  Administered 2022-05-07: 25 mg via ORAL
  Filled 2022-05-07: qty 1

## 2022-05-07 MED ORDER — ONDANSETRON HCL 4 MG PO TABS: 4.0000 mg | ORAL_TABLET | Freq: Four times a day (QID) | ORAL | Status: DC | PRN

## 2022-05-07 MED ORDER — ONDANSETRON HCL 4 MG/2ML IJ SOLN: 4.0000 mg | Freq: Four times a day (QID) | INTRAMUSCULAR | Status: DC | PRN

## 2022-05-07 NOTE — Assessment & Plan Note (Signed)
creatinine of 3.04 up from 1.33 a few months prior. Likely related to hypovolemia from blood loss IV hydration and monitor.  Avoid nephrotoxins

## 2022-05-07 NOTE — Progress Notes (Signed)
ANTICOAGULATION CONSULT NOTE - Initial Consult  Pharmacy Consult for Warfarin  Indication: atrial fibrillation/Hx of PE   Allergies  Allergen Reactions   Levaquin [Levofloxacin In D5w] Other (See Comments)    hallucinations   Sulfa Antibiotics     Hallucinations     Patient Measurements: Height: '5\' 6"'$  (167.6 cm) Weight: 81.6 kg (180 lb) IBW/kg (Calculated) : 59.3 Heparin Dosing Weight:   Vital Signs: Temp: 98 F (36.7 C) (11/08 2156) Temp Source: Oral (11/08 2156) BP: 157/49 (11/08 2154) Pulse Rate: 71 (11/08 2154)  Labs: Recent Labs    05/07/22 1036 05/07/22 1712  HGB  --  9.2*  HCT  --  28.2*  PLT  --  301  LABPROT 56.0 Repeated and verified X2.* 41.4*  INR 5.7 Repeated and verified X2.* 4.4*  CREATININE  --  3.04*    Estimated Creatinine Clearance: 12.7 mL/min (A) (by C-G formula based on SCr of 3.04 mg/dL (H)).   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 Declined postoperative radiation and antiestrogen therapy.   Edema    Endometrial cancer (HCC)    Hyperlipidemia    Hypertension    Hypothyroidism    IBS (irritable bowel syndrome)    Irregular heart beat    stage III kidney disease    Thyroid disease    s/p radioactive iodine ablation 30 years ago    Medications:  (Not in a hospital admission)   Assessment: Pharmacy consulted to dose warfarin for Afib/Hx of PE in this 88 year old female admitted for elevated INR, acute on chronic blood loss.    11/8:  Hct = 28.2, Hgb = 9.2           INR = 4.4   Pt was on Warfarin 1 mg PO daily except Thurs on which she takes 2 mg. Per RN note pt was compliant with warfarin.   Goal of Therapy:  INR 2-3    Plan:  Will hold warfarin for now and recheck INR on 11/9 with AM labs.   Aquarius Latouche  D 05/07/2022,10:05 PM

## 2022-05-07 NOTE — Telephone Encounter (Signed)
Noted. Thanks for calling.

## 2022-05-07 NOTE — Telephone Encounter (Signed)
I spoke with [name] patient's daughter & she stated that patient was in hospice care right now. She has endometrial cancer & her vaginal bleeding has increased a lot. She stated that she felt the end was near for patient. She had not had her coumadin today but she had been taking the 1 mg daily & her numbers had been good. Pt's daughter was to call hospice when we hung up the phone so they could call EMS for patient.

## 2022-05-07 NOTE — Assessment & Plan Note (Signed)
Chronic vaginal bleeding secondary to endometrial cancer Can consider GYN consult in view of acute anemia

## 2022-05-07 NOTE — Assessment & Plan Note (Signed)
Continue losartan and nebivolol

## 2022-05-07 NOTE — ED Notes (Signed)
First Nurse Note: Pt to ED via [facility] from home for abnormal blood work. Pts INR was 5.4. Pt is not having any bleeding. Pt is a hospice pt. Hospice advised for pt to come there but pts son in law wanted to come to the ED

## 2022-05-07 NOTE — ED Notes (Signed)
ED Provider at bedside. 

## 2022-05-07 NOTE — Assessment & Plan Note (Signed)
Continue levothyroxine 

## 2022-05-07 NOTE — ED Notes (Signed)
Patient resting in bed free from sign of distress. Breathing unlabored speaking in full sentences with symmetric chest rise and fall. Bed low and locked with side rails raised x2. Call bell in reach and monitor in place.   Pt requested pad be changed and RN assisted pt with this task. Peri care performed as well. Pt then assisted with repositioning in bed for comfort. Bedside table moved to within pt reach and snack of applesauce and graham crackers provided per request. Pt denies further needs at this time.

## 2022-05-07 NOTE — Telephone Encounter (Signed)
Duwayne Heck from [facility] called with critical lab on patient. Her INR was 5.7 & protime was 56.0. Please advise?  Current Coumadin 1 mg dose: take 2 tablets by mouth on Thursday and 1 tablet by mouth all other days.

## 2022-05-07 NOTE — Assessment & Plan Note (Addendum)
Supratherapeutic INR Chronic vaginal bleeding secondary to endometrial cancer INR of 4.4, hemoglobin of 9.2 down from 11.4 about 3 months prior   Secondary to vaginal bleed in combination with supratherapeutic INR Serial H&H and transfuse if needed Hold Coumadin until therapeutic 2-3

## 2022-05-07 NOTE — H&P (Signed)
History and Physical    Patient: Whitney Simmons LKG:[id number] DOB: 04/19/1928 DOA: 05/07/2022 DOS: the patient was seen and examined on 05/07/2022 PCP: Leone Haven, MD  Patient coming from: Home  Chief Complaint:  Chief Complaint  Patient presents with   Abnormal Labs    HPI: Whitney Simmons is a 88 y.o. female with medical history significant for hypertension, hypothyroidism, CKD 3, history of breast cancer, endometrial cancer since 2021 s/p IUD after declining surgery and currently on hospice, bilateral PE in 2021 on Coumadin, CKD 3B who was sent to the ED by PCP for an elevated INR of 5 associated with her baseline vaginal. She denies rectal bleeding. She Has had no vomiting.  No noted bruising.  No headache or change in mental status. ED course and data review: Vitals within normal limits.  Labs significant for INR of 4.4, hemoglobin of 9.2 down from 11.4 about 3 months prior and creatinine of 3.04 up from 1.33 a few months prior.  Daughter was reluctant to take patient home given the new anemia and need for hydration.  Hospitalist consulted for admission   Review of Systems: As mentioned in the history of present illness. All other systems reviewed and are negative.  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 Declined postoperative radiation and antiestrogen therapy.   Edema    Endometrial cancer (HCC)    Hyperlipidemia    Hypertension    Hypothyroidism    IBS (irritable bowel syndrome)    Irregular heart beat    stage III kidney disease    Thyroid disease    s/p radioactive iodine ablation 30 years ago   Past Surgical History:  Procedure Laterality Date   BREAST LUMPECTOMY Left 2015   DUCTAL CARCINOMA IN SITU WITH CALCIFICATIONS   BREAST SURGERY Left 02-15-14   wide excision   CATARACT  EXTRACTION     CHOLECYSTECTOMY     HYSTEROSCOPY WITH D & C N/A 10/18/2019   Procedure: DILATATION AND CURETTAGE /HYSTEROSCOPY;  Surgeon: Homero Fellers, MD;  Location: [facility] ORS;  Service: Gynecology;  Laterality: N/A;   TOE AMPUTATION     left 5th toe   Social History:  reports that she has never smoked. She has never used smokeless tobacco. She reports that she does not drink alcohol and does not use drugs.  Allergies  Allergen Reactions   Levaquin [Levofloxacin In D5w] Other (See Comments)    hallucinations   Sulfa Antibiotics     Hallucinations     Family History  Problem Relation Age of Onset   Heart disease Mother    Heart disease Father    Heart attack Father    Heart disease Maternal Aunt    Heart disease Brother    AAA (abdominal aortic aneurysm) Other    Stroke Maternal Aunt    Breast cancer Neg Hx     Prior to Admission medications   Medication Sig Start Date End Date Taking? Authorizing Provider  calcium-vitamin D (OSCAL WITH D) 500-5 MG-MCG tablet Take 1 tablet by mouth 2 (two) times daily. Patient not taking: Reported on 04/07/2022 09/18/21   Borders, Kirt Boys, NP  furosemide (LASIX) 20 MG tablet TAKE 1 TABLET(20 MG) BY MOUTH DAILY 08/12/21   Leone Haven, MD  latanoprost (XALATAN) 0.005 % ophthalmic solution Place 1 drop into both  eyes at bedtime.  01/31/19   [provider]  levothyroxine (SYNTHROID) 88 MCG tablet TAKE 1 TABLET BY MOUTH EVERY DAY BEFORE BREAKFAST 09/19/21   Leone Haven, MD  losartan (COZAAR) 25 MG tablet Take 1 tablet (25 mg total) by mouth daily. 08/12/21   Leone Haven, MD  nebivolol (BYSTOLIC) 5 MG tablet TAKE 1 TABLET(5 MG) BY MOUTH DAILY 08/12/21   Leone Haven, MD  ondansetron (ZOFRAN) 4 MG tablet Take 1 tablet (4 mg total) by mouth every 8 (eight) hours as needed for nausea or vomiting. 09/18/21   Borders, Kirt Boys, NP  prochlorperazine (COMPAZINE) 10 MG tablet Take 1 tablet (10 mg total) by mouth every 8  (eight) hours as needed for nausea or vomiting. Patient not taking: Reported on 04/07/2022 09/18/21   Borders, Kirt Boys, NP  warfarin (COUMADIN) 1 MG tablet TAKE 2 TABLET BY MOUTH ON THURSDAY AND 1 TABLET BY MOUTH ALL THE OTHER DAYS 03/28/22   Leone Haven, MD    Physical Exam: Vitals:   05/07/22 1657 05/07/22 1945  BP: 124/79 (!) 169/61  Pulse: 75 79  Resp: 16 (!) 23  Temp: 97.9 F (36.6 C)   TempSrc: Oral   SpO2: 97% 97%  Weight: 81.6 kg   Height: '5\' 6"'$  (1.676 m)    Physical Exam Vitals and nursing note reviewed.  Constitutional:      General: She is not in acute distress. HENT:     Head: Normocephalic and atraumatic.  Cardiovascular:     Rate and Rhythm: Normal rate and regular rhythm.     Heart sounds: Normal heart sounds.  Pulmonary:     Effort: Pulmonary effort is normal.     Breath sounds: Normal breath sounds.  Abdominal:     Palpations: Abdomen is soft.     Tenderness: There is no abdominal tenderness.  Neurological:     Mental Status: Mental status is at baseline.     Labs on Admission: I have personally reviewed following labs and imaging studies  CBC: Recent Labs  Lab 05/07/22 1712  WBC 9.9  NEUTROABS 7.9*  HGB 9.2*  HCT 28.2*  MCV 87.9  PLT 161   Basic Metabolic Panel: Recent Labs  Lab 05/07/22 1712  NA 134*  K 4.9  CL 102  CO2 25  GLUCOSE 120*  BUN 44*  CREATININE 3.04*  CALCIUM 8.3*   GFR: Estimated Creatinine Clearance: 12.7 mL/min (A) (by C-G formula based on SCr of 3.04 mg/dL (H)). Liver Function Tests: No results for input(s): "AST", "ALT", "ALKPHOS", "BILITOT", "PROT", "ALBUMIN" in the last 168 hours. No results for input(s): "LIPASE", "AMYLASE" in the last 168 hours. No results for input(s): "AMMONIA" in the last 168 hours. Coagulation Profile: Recent Labs  Lab 05/07/22 1036 05/07/22 1712  INR 5.7 Repeated and verified X2.* 4.4*   Cardiac Enzymes: No results for input(s): "CKTOTAL", "CKMB", "CKMBINDEX", "TROPONINI"  in the last 168 hours. BNP (last 3 results) No results for input(s): "PROBNP" in the last 8760 hours. HbA1C: No results for input(s): "HGBA1C" in the last 72 hours. CBG: No results for input(s): "GLUCAP" in the last 168 hours. Lipid Profile: No results for input(s): "CHOL", "HDL", "LDLCALC", "TRIG", "CHOLHDL", "LDLDIRECT" in the last 72 hours. Thyroid Function Tests: No results for input(s): "TSH", "T4TOTAL", "FREET4", "T3FREE", "THYROIDAB" in the last 72 hours. Anemia Panel: No results for input(s): "VITAMINB12", "FOLATE", "FERRITIN", "TIBC", "IRON", "RETICCTPCT" in the last 72 hours. Urine analysis:    Component Value Date/Time  COLORURINE YELLOW (A) 09/18/2021 1050   APPEARANCEUR CLOUDY (A) 09/18/2021 1050   LABSPEC 1.016 09/18/2021 1050   PHURINE 5.0 09/18/2021 1050   GLUCOSEU NEGATIVE 09/18/2021 1050   HGBUR LARGE (A) 09/18/2021 1050   BILIRUBINUR NEGATIVE 09/18/2021 1050   BILIRUBINUR neg 09/03/2020 1550   KETONESUR NEGATIVE 09/18/2021 1050   PROTEINUR 30 (A) 09/18/2021 1050   UROBILINOGEN 0.2 09/03/2020 1550   NITRITE NEGATIVE 09/18/2021 1050   LEUKOCYTESUR LARGE (A) 09/18/2021 1050    Radiological Exams on Admission: No results found.   Data Reviewed: Relevant notes from primary care and specialist visits, past discharge summaries as available in EHR, including Care Everywhere. Prior diagnostic testing as pertinent to current admission diagnoses Updated medications and problem lists for reconciliation ED course, including vitals, labs, imaging, treatment and response to treatment Triage notes, nursing and pharmacy notes and ED provider's notes Notable results as noted in HPI   Assessment and Plan: * Acute on chronic blood loss anemia Supratherapeutic INR Chronic vaginal bleeding secondary to endometrial cancer INR of 4.4, hemoglobin of 9.2 down from 11.4 about 3 months prior   Secondary to vaginal bleed in combination with supratherapeutic INR Serial H&H and  transfuse if needed Hold Coumadin until therapeutic 2-3  Acute renal failure superimposed on stage 3b chronic kidney disease (HCC) creatinine of 3.04 up from 1.33 a few months prior. Likely related to hypovolemia from blood loss IV hydration and monitor.  Avoid nephrotoxins  Endometrial cancer, grade I s/p IUD(HCC) Chronic vaginal bleeding secondary to endometrial cancer Can consider GYN consult in view of acute anemia  Hypertension Continue losartan and nebivolol  Hypothyroidism Continue levothyroxine        DVT prophylaxis:  On Coumadin  Consults: none  Advance Care Planning:   Code Status: Prior   Family Communication: none  Disposition Plan: Back to previous home environment  Severity of Illness: The appropriate patient status for this patient is OBSERVATION. Observation status is judged to be reasonable and necessary in order to provide the required intensity of service to ensure the patient's safety. The patient's presenting symptoms, physical exam findings, and initial radiographic and laboratory data in the context of their medical condition is felt to place them at decreased risk for further clinical deterioration. Furthermore, it is anticipated that the patient will be medically stable for discharge from the hospital within 2 midnights of admission.   Author: Athena Masse, MD 05/07/2022 9:35 PM  For on call review [email or web address].

## 2022-05-07 NOTE — Telephone Encounter (Signed)
Please call the patient. See if she is having any bleeding. If she is having any bleeding she needs to be seen in the ED right away. Please see if she has taken the coumadin today. She needs to stop the coumadin and have her INR rechecked tomorrow or the next day. Please see if she has been on any antibiotics or steroids recently. How much coumadin has she been taking? She should have been on 1 mg per day. Has she taken more than that?

## 2022-05-07 NOTE — ED Triage Notes (Signed)
Patient sent to ED by PCP for elevated coumadin levels. Patient believes it is a 5. Patient states she has been taking it as prescribed.

## 2022-05-07 NOTE — ED Provider Notes (Signed)
[facility] Provider Note    Event Date/Time   First MD Initiated Contact with Patient 05/07/22 1937     (approximate)   History   Abnormal Labs   HPI  Whitney Simmons is a 88 y.o. female with history of endometrial cancer on hospice who comes in with concerns for abnormal INR.  Patient had an INR level of over 5 and given she has some baseline vaginal bleeding and she was told to come to the emergency room.  She reports her vaginal bleeding is at its baseline.  No worsening is normal.  She denies any other new symptoms.  Physical Exam   Triage Vital Signs: ED Triage Vitals  Enc Vitals Group     BP 05/07/22 1657 124/79     Pulse Rate 05/07/22 1657 75     Resp 05/07/22 1657 16     Temp 05/07/22 1657 97.9 F (36.6 C)     Temp Source 05/07/22 1657 Oral     SpO2 05/07/22 1657 97 %     Weight 05/07/22 1657 180 lb (81.6 kg)     Height 05/07/22 1657 '5\' 6"'$  (1.676 m)     Head Circumference --      Peak Flow --      Pain Score 05/07/22 1709 0     Pain Loc --      Pain Edu? --      Excl. in [place]? --     Most recent vital signs: Vitals:   05/07/22 1657  BP: 124/79  Pulse: 75  Resp: 16  Temp: 97.9 F (36.6 C)  SpO2: 97%     General: Awake, no distress.  CV:  Good peripheral perfusion.  Resp:  Normal effort.  Abd:  No distention.  Other:     ED Results / Procedures / Treatments   Labs (all labs ordered are listed, but only abnormal results are displayed) Labs Reviewed  PROTIME-INR - Abnormal; Notable for the following components:      Result Value   Prothrombin Time 41.4 (*)    INR 4.4 (*)    All other components within normal limits  CBC WITH DIFFERENTIAL/PLATELET - Abnormal; Notable for the following components:   RBC 3.21 (*)    Hemoglobin 9.2 (*)    HCT 28.2 (*)    Neutro Abs 7.9 (*)    Monocytes Absolute 1.1 (*)    All other components within normal limits  BASIC METABOLIC PANEL - Abnormal; Notable for the following components:    Sodium 134 (*)    Glucose, Bld 120 (*)    BUN 44 (*)    Creatinine, Ser 3.04 (*)    Calcium 8.3 (*)    GFR, Estimated 14 (*)    All other components within normal limits      PROCEDURES:  Critical Care performed: No  .1-3 Lead EKG Interpretation  Performed by: Vanessa Trenton, MD Authorized by: Vanessa Parmer, MD     Interpretation: normal     ECG rate:  70   ECG rate assessment: normal     Rhythm: sinus rhythm     Ectopy: none     Conduction: normal      MEDICATIONS ORDERED IN ED: Medications  losartan (COZAAR) tablet 25 mg (25 mg Oral Given 05/07/22 2254)  nebivolol (BYSTOLIC) tablet 5 mg (5 mg Oral Not Given 05/07/22 2259)  levothyroxine (SYNTHROID) tablet 88 mcg (has no administration in time range)  acetaminophen (TYLENOL) tablet 650  mg (has no administration in time range)    Or  acetaminophen (TYLENOL) suppository 650 mg (has no administration in time range)  ondansetron (ZOFRAN) tablet 4 mg (has no administration in time range)    Or  ondansetron (ZOFRAN) injection 4 mg (has no administration in time range)  0.9 %  sodium chloride infusion ( Intravenous New Bag/Given 05/07/22 2256)  sodium chloride 0.9 % bolus 1,000 mL (0 mLs Intravenous Stopped 05/07/22 2259)     IMPRESSION / MDM / ASSESSMENT AND PLAN / ED COURSE  I reviewed the triage vital signs and the nursing notes.   Patient's presentation is most consistent with acute presentation with potential threat to life or bodily function.   Patient came in with elevated INR with concerns for vaginal bleeding.  Labs ordered evaluate for anemia, dehydration.  Patient's labs noted to be slightly drop in hemoglobin although she denies any worsening bleeding than normal I discussed with pharmacy given her INR is now down to 4 whether or not we should do vitamin K.  Given her INR is already dropped immensely with holding the warfarin we will hold off and just hold warfarin for now.  Her creatinine is significantly  elevated at 3.  Discussed that typically we would admit these for IV fluids, monitoring bleeding and repeat hemoglobin in the morning but given her hospice status I was not sure about her goals of care.  After discussion with the family and the patient's daughter they would like to have patient admitted for the above.  The patient is on the cardiac monitor to evaluate for evidence of arrhythmia and/or significant heart rate changes.      FINAL CLINICAL IMPRESSION(S) / ED DIAGNOSES   Final diagnoses:  AKI (acute kidney injury) ([place])  Elevated INR  Vaginal bleeding     Rx / DC Orders   ED Discharge Orders     None        Note:  This document was prepared using Dragon voice recognition software and may include unintentional dictation errors.   Vanessa Olmitz, MD 05/07/22 2306

## 2022-05-08 ENCOUNTER — Encounter: Payer: Self-pay | Admitting: Internal Medicine

## 2022-05-08 ENCOUNTER — Telehealth: Payer: Self-pay | Admitting: Family Medicine

## 2022-05-08 DIAGNOSIS — Z7401 Bed confinement status: Secondary | ICD-10-CM | POA: Diagnosis not present

## 2022-05-08 DIAGNOSIS — I959 Hypotension, unspecified: Secondary | ICD-10-CM | POA: Diagnosis not present

## 2022-05-08 DIAGNOSIS — W19XXXA Unspecified fall, initial encounter: Secondary | ICD-10-CM | POA: Diagnosis not present

## 2022-05-08 DIAGNOSIS — D62 Acute posthemorrhagic anemia: Secondary | ICD-10-CM | POA: Diagnosis not present

## 2022-05-08 LAB — BASIC METABOLIC PANEL
Anion gap: 6 (ref 5–15)
BUN: 41 mg/dL — ABNORMAL HIGH (ref 8–23)
CO2: 22 mmol/L (ref 22–32)
Calcium: 8 mg/dL — ABNORMAL LOW (ref 8.9–10.3)
Chloride: 106 mmol/L (ref 98–111)
Creatinine, Ser: 2.66 mg/dL — ABNORMAL HIGH (ref 0.44–1.00)
GFR, Estimated: 16 mL/min — ABNORMAL LOW (ref >60)
Glucose, Bld: 99 mg/dL (ref 70–99)
Potassium: 4.8 mmol/L (ref 3.5–5.1)
Sodium: 134 mmol/L — ABNORMAL LOW (ref 135–145)

## 2022-05-08 LAB — PROTIME-INR
INR: 4.5 (ref 0.8–1.2)
Prothrombin Time: 42.3 seconds — ABNORMAL HIGH (ref 11.4–15.2)

## 2022-05-08 MED ORDER — SODIUM CHLORIDE 0.9 % IV SOLN: INTRAVENOUS | Status: DC

## 2022-05-08 NOTE — Discharge Summary (Signed)
Physician Discharge Summary  Whitney Simmons ZHG:[id number] DOB: [birth year] DOA: 05/07/2022  PCP: Leone Haven, MD  Admit date: 05/07/2022 Discharge date: 05/08/2022 Recommendations for Outpatient Follow-up:  Follow up with hospice team  Discharge Dispo: inpatient hospice Discharge Condition: Stable Code Status:   Code Status: DNR Diet recommendation:  Diet Order             Diet Heart Room service appropriate? Yes; Fluid consistency: Thin  Diet effective now                 Brief/Interim Summary: 88 y.o. female  W/ HX OF HTN, hypothyroidism, history of breast cancer, endometrial cancer since 2021 s/p IUD after declining surgery and currently on hospice, bilateral PE in 2021 on Coumadin, CKD 3B secondary to elevated INR of 5. In the ED INR downtrending to 4.4 hemoglobin 9.2 g previous baseline 11.43 months earlier, creatinine elevated to 3 previous 1.3 few months earlier.  EDP discussed with patient and family and given suspected INR and AKI family requested admission for management of above  Overnight AKI downtrending INR is still supratherapeutic dropping hemoglobin.  Patient was seen by hospice team and discussed with the daughter.  I also discussed with patient's daughter over the phone the plan is to not prolong and continue comfort measures and discharging to inpatient hospice. I advised to hold Coumadin at least while INR Is > 3- can consider resuming after that- patient daughter would like to talk to her primary care doctor to discuss about hold Coumadin situation.  Discharge Diagnoses:  Principal Problem:   Acute on chronic blood loss anemia Active Problems:   Acute renal failure superimposed on stage 3b chronic kidney disease (HCC)   History of pulmonary embolism   Endometrial cancer, grade I s/p IUD(HCC)   Subtherapeutic international normalized ratio (INR)   Hypothyroidism   Hypertension  Acute on chronic blood loss anemia Supratherapeutic INR Chronic vaginal  bleeding due to endometrial cancer: Discharging to inpatient hospice AKI on CKD stage IIIb: B/L creat 1.3 a few months ago on admission and 3, trending down 2.6 encourage oral hydration can hold Lasix   Endometrial cancer S/P IUD Chronic vaginal bleeding: Currently hospice patient.  Bleeding not worse from baseline.   Hypertension Hypothyroidism on levothyroxine    Consults: Hospice team Subjective: Alert awake interactive very hard of hearing " I am supposed to leave the hospital today"  Discharge Exam: Vitals:   05/08/22 0949 05/08/22 1000  BP: (!) 166/133 (!) 124/45  Pulse: 68 76  Resp: (!) 22 (!) 22  Temp:    SpO2: 99% 98%   General: Pt is alert, awake, not in acute distress Cardiovascular: RRR, S1/S2 +, no rubs, no gallops Respiratory: CTA bilaterally, no wheezing, no rhonchi Abdominal: Soft, NT, ND, bowel sounds + Extremities: no edema, no cyanosis  Discharge Instructions  Discharge Instructions     Discharge instructions   Complete by: As directed    Coumadin is in hold as inr  4.5-if the plan is to resume Coumadin recheck INR tomorrow and resume to maintain therapeutic level.  If not drinking eating well continue hold Lasix in the setting of acute renal failure  Please call call MD or return to ER for similar or worsening recurring problem that brought you to hospital or if any fever,nausea/vomiting,abdominal pain, uncontrolled pain, chest pain,  shortness of breath or any other alarming symptoms.  Please follow-up your doctor as instructed in a week time and call the office for appointment.  Please avoid alcohol, smoking, or any other illicit substance and maintain healthy habits including taking your regular medications as prescribed.  You were cared for by a hospitalist during your hospital stay. If you have any questions about your discharge medications or the care you received while you were in the hospital after you are discharged, you can call the  unit and ask to speak with the hospitalist on call if the hospitalist that took care of you is not available.  Once you are discharged, your primary care physician will handle any further medical issues. Please note that NO REFILLS for any discharge medications will be authorized once you are discharged, as it is imperative that you return to your primary care physician (or establish a relationship with a primary care physician if you do not have one) for your aftercare needs so that they can reassess your need for medications and monitor your lab values   Increase activity slowly   Complete by: As directed       Allergies as of 05/08/2022       Reactions   Levaquin [levofloxacin In D5w] Other (See Comments)   hallucinations   Sulfa Antibiotics    Hallucinations        Medication List     STOP taking these medications    warfarin 1 MG tablet Commonly known as: COUMADIN       TAKE these medications    calcium-vitamin D 500-5 MG-MCG tablet Commonly known as: OSCAL WITH D Take 1 tablet by mouth 2 (two) times daily.   furosemide 20 MG tablet Commonly known as: LASIX TAKE 1 TABLET(20 MG) BY MOUTH DAILY   latanoprost 0.005 % ophthalmic solution Commonly known as: XALATAN Place 1 drop into both eyes at bedtime.   levothyroxine 88 MCG tablet Commonly known as: SYNTHROID TAKE 1 TABLET BY MOUTH EVERY DAY BEFORE BREAKFAST   losartan 25 MG tablet Commonly known as: COZAAR Take 1 tablet (25 mg total) by mouth daily.   nebivolol 5 MG tablet Commonly known as: BYSTOLIC TAKE 1 TABLET(5 MG) BY MOUTH DAILY   ondansetron 4 MG tablet Commonly known as: Zofran Take 1 tablet (4 mg total) by mouth every 8 (eight) hours as needed for nausea or vomiting.   prochlorperazine 10 MG tablet Commonly known as: COMPAZINE Take 1 tablet (10 mg total) by mouth every 8 (eight) hours as needed for nausea or vomiting.        Allergies  Allergen Reactions   Levaquin [Levofloxacin In  D5w] Other (See Comments)    hallucinations   Sulfa Antibiotics     Hallucinations     The results of significant diagnostics from this hospitalization (including imaging, microbiology, ancillary and laboratory) are listed below for reference.    Microbiology: No results found for this or any previous visit (from the past 240 hour(s)).  Procedures/Studies: No results found.  Labs: BNP (last 3 results) No results for input(s): "BNP" in the last 8760 hours. Basic Metabolic Panel: Recent Labs  Lab 05/07/22 1712 05/08/22 0521  NA 134* 134*  K 4.9 4.8  CL 102 106  CO2 25 22  GLUCOSE 120* 99  BUN 44* 41*  CREATININE 3.04* 2.66*  CALCIUM 8.3* 8.0*   Liver Function Tests: No results for input(s): "AST", "ALT", "ALKPHOS", "BILITOT", "PROT", "ALBUMIN" in the last 168 hours. No results for input(s): "LIPASE", "AMYLASE" in the last 168 hours. No results for input(s): "AMMONIA" in the last 168 hours. CBC: Recent Labs  Lab 05/07/22 1712  05/07/22 2346  WBC 9.9  --   NEUTROABS 7.9*  --   HGB 9.2* 8.4*  HCT 28.2*  --   MCV 87.9  --   PLT 301  --    Cardiac Enzymes: No results for input(s): "CKTOTAL", "CKMB", "CKMBINDEX", "TROPONINI" in the last 168 hours. BNP: Invalid input(s): "POCBNP" CBG: No results for input(s): "GLUCAP" in the last 168 hours. D-Dimer No results for input(s): "DDIMER" in the last 72 hours. Hgb A1c No results for input(s): "HGBA1C" in the last 72 hours. Lipid Profile No results for input(s): "CHOL", "HDL", "LDLCALC", "TRIG", "CHOLHDL", "LDLDIRECT" in the last 72 hours. Thyroid function studies No results for input(s): "TSH", "T4TOTAL", "T3FREE", "THYROIDAB" in the last 72 hours.  Invalid input(s): "FREET3" Anemia work up No results for input(s): "VITAMINB12", "FOLATE", "FERRITIN", "TIBC", "IRON", "RETICCTPCT" in the last 72 hours. Urinalysis    Component Value Date/Time   COLORURINE YELLOW (A) 09/18/2021 1050   APPEARANCEUR CLOUDY (A) 09/18/2021  1050   LABSPEC 1.016 09/18/2021 1050   PHURINE 5.0 09/18/2021 1050   GLUCOSEU NEGATIVE 09/18/2021 1050   HGBUR LARGE (A) 09/18/2021 1050   BILIRUBINUR NEGATIVE 09/18/2021 1050   BILIRUBINUR neg 09/03/2020 1550   KETONESUR NEGATIVE 09/18/2021 1050   PROTEINUR 30 (A) 09/18/2021 1050   UROBILINOGEN 0.2 09/03/2020 1550   NITRITE NEGATIVE 09/18/2021 1050   LEUKOCYTESUR LARGE (A) 09/18/2021 1050   Sepsis Labs Recent Labs  Lab 05/07/22 1712  WBC 9.9   Microbiology No results found for this or any previous visit (from the past 240 hour(s)).  Time coordinating discharge: 25 minutes  SIGNED: Antonieta Pert, MD  [facility] 05/08/2022, 10:05 AM  If 7PM-7AM, please contact night-coverage [email or web address]

## 2022-05-08 NOTE — ED Notes (Signed)
Patient resting in bed free from sign of distress. Breathing unlabored speaking in full sentences with symmetric chest rise and fall. Bed low and locked with side rails raised x2. Call bell in reach and monitor in place. Pt provided with TV remote and assisted with finding channel to watch

## 2022-05-08 NOTE — ED Notes (Signed)
Margaretmary Eddy from hospice to ED to discuss pt's POC. Olivia Mackie states she prefers pt. To be discharged, and she will treat pt. At their facility. Olivia Mackie states she will discuss with pt's daughter, and will secure chat this RN with decision. Dr. Maren Beach will be notified.

## 2022-05-08 NOTE — Hospital Course (Addendum)
88 y.o. female  W/ HX OF HTN, hypothyroidism, history of breast cancer, endometrial cancer since 2021 s/p IUD after declining surgery and currently on hospice, bilateral PE in 2021 on Coumadin, CKD 3B secondary to elevated INR of 5. In the ED INR downtrending to 4.4 hemoglobin 9.2 g previous baseline 11.43 months earlier, creatinine elevated to 3 previous 1.3 few months earlier.  EDP discussed with patient and family and given suspected INR and AKI family requested admission for management of above

## 2022-05-08 NOTE — ED Notes (Signed)
Patient resting in bed free from sign of distress. Breathing unlabored speaking in full sentences with symmetric chest rise and fall. Bed low and locked with side rails raised x2. Call bell in reach and monitor in place.   

## 2022-05-08 NOTE — Telephone Encounter (Signed)
Patient was discharged from the hospital today. Please have someone call the patient or her daughter to set up hospital follow-up with me. Thanks.

## 2022-05-08 NOTE — Progress Notes (Signed)
[facility] ED 7 [facility]) Hospital Liaison Note  Whitney Simmons is a current hospice patient with a terminal diagnosis of malignant neoplasm of endometrium. Patient visited her PCP yesterday where labs were drawn. Family received a call from PCP around 4 pm requesting patient go directly to the ED for a critical INR value. Creatinine was also found to be elevated and IVF initiated for gentle hydration.   Patient and family offered opportunity to return home with the support of hospice services, however, daughter opted for admission to continue IVF and monitor labwork as she herself is needing some rest as she is currently experiencing her own health issues.  Discussed with hospice MD and Hospice Home to transfer to [place] for emergency respite stay. Discussed with daughter Whitney Simmons by phone, who is agreeable to accept [facility] respite bed. Whitney Simmons requests [facility] transport, who has been called and states patient is second in line. Packet with ED Unit Secretary and contains current DNR. Patient is very hard of hearing but is aware of transport to the Hospice Home. Whitney Simmons is very concerned about her daughters health and her need for rest.  Dr. Maren Simmons and bedside RN, aware and supportive of plan. Discharge summary complete.  Please remove all IV's.   Please call if there are any hospice related questions or concerns.  Thank you, Whitney Simmons, BSN, RN [facility] Liaison [phone]

## 2022-05-08 NOTE — ED Notes (Signed)
Pt sitting in stretcher, no apparent distress noted.  Requested morning dose of synthroid from pharmacy.

## 2022-05-09 NOTE — Telephone Encounter (Signed)
I called and spoke with the patients daughter and she stated the patient is now in hospice, she stated she is meeting with the patients nurse today and she will see what they would like for her to do but they are saying its nothing else left for them to do.  Glendal Cassaday,cma

## 2022-05-09 NOTE — Telephone Encounter (Signed)
Noted. If they need anything from me please let me know.

## 2022-05-16 ENCOUNTER — Telehealth: Payer: Self-pay | Admitting: Family Medicine

## 2022-05-16 NOTE — Telephone Encounter (Signed)
Whitney Simmons from [facility] would like to be called in regards to the pt and starting medication back that the hospital stop her from taking. The medications are Lasix and xanax

## 2022-05-19 NOTE — Telephone Encounter (Signed)
Please call and see what they need regarding this. Thanks.

## 2022-05-20 NOTE — Telephone Encounter (Signed)
Whitney Simmons at [facility] stated she already got orders from the Hospice Doctor.

## 2022-05-29 ENCOUNTER — Ambulatory Visit: Payer: PPO | Admitting: Podiatry

## 2022-05-30 DEATH — deceased

## 2022-07-16 ENCOUNTER — Ambulatory Visit: Payer: PPO

## 2024-05-17 NOTE — Telephone Encounter (Signed)
 open in error

## 2024-05-19 NOTE — Telephone Encounter (Signed)
 open in error
